# Patient Record
Sex: Male | Born: 1945 | Race: White | Hispanic: No | Smoking: Current every day smoker
Health system: Southern US, Community
[De-identification: ages and names within clinical notes are randomized; demographics above are authoritative.]

## PROBLEM LIST (undated history)

## (undated) DIAGNOSIS — I10 Essential (primary) hypertension: Secondary | ICD-10-CM

## (undated) DIAGNOSIS — K7290 Hepatic failure, unspecified without coma: Secondary | ICD-10-CM

## (undated) DIAGNOSIS — K760 Fatty (change of) liver, not elsewhere classified: Secondary | ICD-10-CM

## (undated) DIAGNOSIS — I8500 Esophageal varices without bleeding: Secondary | ICD-10-CM

## (undated) DIAGNOSIS — N390 Urinary tract infection, site not specified: Secondary | ICD-10-CM

## (undated) DIAGNOSIS — F329 Major depressive disorder, single episode, unspecified: Secondary | ICD-10-CM

## (undated) DIAGNOSIS — K7460 Unspecified cirrhosis of liver: Secondary | ICD-10-CM

## (undated) DIAGNOSIS — S12100A Unspecified displaced fracture of second cervical vertebra, initial encounter for closed fracture: Secondary | ICD-10-CM

## (undated) DIAGNOSIS — K264 Chronic or unspecified duodenal ulcer with hemorrhage: Secondary | ICD-10-CM

## (undated) DIAGNOSIS — K7682 Hepatic encephalopathy: Secondary | ICD-10-CM

## (undated) DIAGNOSIS — F1911 Other psychoactive substance abuse, in remission: Secondary | ICD-10-CM

## (undated) DIAGNOSIS — F1027 Alcohol dependence with alcohol-induced persisting dementia: Secondary | ICD-10-CM

## (undated) DIAGNOSIS — S32010A Wedge compression fracture of first lumbar vertebra, initial encounter for closed fracture: Secondary | ICD-10-CM

## (undated) DIAGNOSIS — J449 Chronic obstructive pulmonary disease, unspecified: Secondary | ICD-10-CM

## (undated) DIAGNOSIS — G9340 Encephalopathy, unspecified: Secondary | ICD-10-CM

## (undated) DIAGNOSIS — R188 Other ascites: Secondary | ICD-10-CM

## (undated) DIAGNOSIS — F32A Depression, unspecified: Secondary | ICD-10-CM

## (undated) DIAGNOSIS — R768 Other specified abnormal immunological findings in serum: Secondary | ICD-10-CM

## (undated) HISTORY — PX: CORONARY ANGIOPLASTY WITH STENT PLACEMENT: SHX49

---

## 1999-06-21 ENCOUNTER — Ambulatory Visit (HOSPITAL_COMMUNITY): Admission: RE | Admit: 1999-06-21 | Discharge: 1999-06-22 | Payer: Self-pay | Admitting: Cardiovascular Disease

## 2001-07-29 ENCOUNTER — Encounter: Payer: Self-pay | Admitting: Family Medicine

## 2001-07-29 ENCOUNTER — Ambulatory Visit (HOSPITAL_COMMUNITY): Admission: RE | Admit: 2001-07-29 | Discharge: 2001-07-29 | Payer: Self-pay | Admitting: Family Medicine

## 2002-06-15 ENCOUNTER — Emergency Department (HOSPITAL_COMMUNITY): Admission: EM | Admit: 2002-06-15 | Discharge: 2002-06-15 | Payer: Self-pay | Admitting: Emergency Medicine

## 2002-06-15 ENCOUNTER — Encounter: Payer: Self-pay | Admitting: Emergency Medicine

## 2002-10-03 ENCOUNTER — Ambulatory Visit (HOSPITAL_COMMUNITY): Admission: RE | Admit: 2002-10-03 | Discharge: 2002-10-03 | Payer: Self-pay | Admitting: Family Medicine

## 2002-10-03 ENCOUNTER — Encounter: Payer: Self-pay | Admitting: Family Medicine

## 2004-02-23 ENCOUNTER — Other Ambulatory Visit: Admission: RE | Admit: 2004-02-23 | Discharge: 2004-02-23 | Payer: Self-pay | Admitting: Dermatology

## 2005-01-16 ENCOUNTER — Ambulatory Visit (HOSPITAL_COMMUNITY): Admission: RE | Admit: 2005-01-16 | Discharge: 2005-01-16 | Payer: Self-pay | Admitting: Family Medicine

## 2005-11-20 ENCOUNTER — Ambulatory Visit (HOSPITAL_COMMUNITY): Admission: RE | Admit: 2005-11-20 | Discharge: 2005-11-20 | Payer: Self-pay | Admitting: Family Medicine

## 2009-03-30 ENCOUNTER — Emergency Department (HOSPITAL_COMMUNITY): Admission: EM | Admit: 2009-03-30 | Discharge: 2009-03-30 | Payer: Self-pay | Admitting: Emergency Medicine

## 2010-11-05 LAB — POCT CARDIAC MARKERS
CKMB, poc: 1.5 ng/mL (ref 1.0–8.0)
Myoglobin, poc: 105 ng/mL (ref 12–200)
Myoglobin, poc: 119 ng/mL (ref 12–200)

## 2010-11-05 LAB — COMPREHENSIVE METABOLIC PANEL
ALT: 27 U/L (ref 0–53)
Alkaline Phosphatase: 50 U/L (ref 39–117)
CO2: 28 mEq/L (ref 19–32)
Chloride: 102 mEq/L (ref 96–112)
Glucose, Bld: 110 mg/dL — ABNORMAL HIGH (ref 70–99)
Potassium: 3.6 mEq/L (ref 3.5–5.1)
Sodium: 137 mEq/L (ref 135–145)
Total Bilirubin: 0.8 mg/dL (ref 0.3–1.2)
Total Protein: 7.2 g/dL (ref 6.0–8.3)

## 2010-11-05 LAB — CBC
HCT: 40.8 % (ref 39.0–52.0)
Hemoglobin: 14 g/dL (ref 13.0–17.0)
MCHC: 34.3 g/dL (ref 30.0–36.0)
MCV: 100.4 fL — ABNORMAL HIGH (ref 78.0–100.0)
Platelets: 176 10*3/uL (ref 150–400)
RBC: 4.06 MIL/uL — ABNORMAL LOW (ref 4.22–5.81)
RDW: 13 % (ref 11.5–15.5)
WBC: 9.7 10*3/uL (ref 4.0–10.5)

## 2010-11-05 LAB — DIFFERENTIAL
Basophils Relative: 0 % (ref 0–1)
Eosinophils Absolute: 0.4 10*3/uL (ref 0.0–0.7)
Monocytes Relative: 7 % (ref 3–12)
Neutrophils Relative %: 65 % (ref 43–77)

## 2011-02-07 ENCOUNTER — Ambulatory Visit: Payer: Self-pay | Admitting: Gastroenterology

## 2011-02-09 ENCOUNTER — Ambulatory Visit: Payer: Self-pay | Admitting: Gastroenterology

## 2011-03-23 ENCOUNTER — Ambulatory Visit: Payer: Self-pay | Admitting: Orthopedic Surgery

## 2011-08-12 ENCOUNTER — Emergency Department (HOSPITAL_COMMUNITY): Payer: No Typology Code available for payment source

## 2011-08-12 ENCOUNTER — Encounter (HOSPITAL_COMMUNITY): Payer: Self-pay | Admitting: *Deleted

## 2011-08-12 ENCOUNTER — Emergency Department (HOSPITAL_COMMUNITY)
Admission: EM | Admit: 2011-08-12 | Discharge: 2011-08-12 | Disposition: A | Discharge status: Home / Self Care | Payer: No Typology Code available for payment source | Attending: Emergency Medicine | Admitting: Emergency Medicine

## 2011-08-12 DIAGNOSIS — S0003XA Contusion of scalp, initial encounter: Secondary | ICD-10-CM | POA: Insufficient documentation

## 2011-08-12 DIAGNOSIS — Y92410 Unspecified street and highway as the place of occurrence of the external cause: Secondary | ICD-10-CM | POA: Insufficient documentation

## 2011-08-12 DIAGNOSIS — J449 Chronic obstructive pulmonary disease, unspecified: Secondary | ICD-10-CM | POA: Insufficient documentation

## 2011-08-12 DIAGNOSIS — S0083XA Contusion of other part of head, initial encounter: Secondary | ICD-10-CM | POA: Insufficient documentation

## 2011-08-12 DIAGNOSIS — M62838 Other muscle spasm: Secondary | ICD-10-CM | POA: Insufficient documentation

## 2011-08-12 DIAGNOSIS — S0990XA Unspecified injury of head, initial encounter: Secondary | ICD-10-CM | POA: Insufficient documentation

## 2011-08-12 DIAGNOSIS — S12100A Unspecified displaced fracture of second cervical vertebra, initial encounter for closed fracture: Secondary | ICD-10-CM | POA: Insufficient documentation

## 2011-08-12 DIAGNOSIS — F17200 Nicotine dependence, unspecified, uncomplicated: Secondary | ICD-10-CM | POA: Insufficient documentation

## 2011-08-12 DIAGNOSIS — I10 Essential (primary) hypertension: Secondary | ICD-10-CM | POA: Insufficient documentation

## 2011-08-12 DIAGNOSIS — J4489 Other specified chronic obstructive pulmonary disease: Secondary | ICD-10-CM | POA: Insufficient documentation

## 2011-08-12 HISTORY — DX: Chronic obstructive pulmonary disease, unspecified: J44.9

## 2011-08-12 HISTORY — DX: Essential (primary) hypertension: I10

## 2011-08-12 MED ORDER — NAPROXEN 500 MG PO TABS: 500.0000 mg | ORAL_TABLET | Freq: Two times a day (BID) | ORAL | Status: DC

## 2011-08-12 MED ORDER — CYCLOBENZAPRINE HCL 10 MG PO TABS: 10.0000 mg | ORAL_TABLET | Freq: Two times a day (BID) | ORAL | Status: DC | PRN

## 2011-08-12 NOTE — ED Notes (Signed)
Received report on pt, pt sitting in wheelchair, c-collar remains in place, cms intact all extremities, family at bedside,
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
## 2011-08-12 NOTE — ED Notes (Signed)
Pt had 20 gauge iv in place in left ac that was removed prior to discharge
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
## 2011-08-13 ENCOUNTER — Encounter (HOSPITAL_COMMUNITY): Payer: Self-pay | Admitting: Emergency Medicine

## 2011-08-13 ENCOUNTER — Emergency Department (HOSPITAL_COMMUNITY): Payer: No Typology Code available for payment source

## 2011-08-13 ENCOUNTER — Emergency Department (HOSPITAL_COMMUNITY)
Admission: EM | Admit: 2011-08-13 | Discharge: 2011-08-13 | Disposition: A | Discharge status: Home / Self Care | Payer: No Typology Code available for payment source | Attending: Emergency Medicine | Admitting: Emergency Medicine

## 2011-08-13 DIAGNOSIS — J4489 Other specified chronic obstructive pulmonary disease: Secondary | ICD-10-CM | POA: Insufficient documentation

## 2011-08-13 DIAGNOSIS — I10 Essential (primary) hypertension: Secondary | ICD-10-CM | POA: Insufficient documentation

## 2011-08-13 DIAGNOSIS — Z951 Presence of aortocoronary bypass graft: Secondary | ICD-10-CM | POA: Insufficient documentation

## 2011-08-13 DIAGNOSIS — M25569 Pain in unspecified knee: Secondary | ICD-10-CM | POA: Insufficient documentation

## 2011-08-13 DIAGNOSIS — J449 Chronic obstructive pulmonary disease, unspecified: Secondary | ICD-10-CM | POA: Insufficient documentation

## 2011-08-13 DIAGNOSIS — F17200 Nicotine dependence, unspecified, uncomplicated: Secondary | ICD-10-CM | POA: Insufficient documentation

## 2011-08-13 DIAGNOSIS — M25461 Effusion, right knee: Secondary | ICD-10-CM

## 2011-08-13 DIAGNOSIS — M25469 Effusion, unspecified knee: Secondary | ICD-10-CM | POA: Insufficient documentation
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
## 2011-08-13 NOTE — ED Notes (Signed)
Patient unstable with knee immobilizer and crutches, per family patient has walker at home. Patient instructed to use walker to ambulate. EDPA aware. Family also instructed to bring patient back if unable to ambulate steadily with walker, Terry-family member member verbalized understanding.

## 2011-08-14 NOTE — ED Provider Notes (Signed)
Medical screening examination/treatment/procedure(s) were performed by non-physician practitioner and as supervising physician I was immediately available for consultation/collaboration.   Joya Gaskins, MD 08/14/11 2047

## 2011-08-15 NOTE — ED Notes (Signed)
Pt returned today 08/15/11 for aspen collar. Collar applied. Pt has knee immobilizer on backward. Immobilizer reapplied correctly

## 2011-08-17 ENCOUNTER — Observation Stay (HOSPITAL_COMMUNITY)
Admission: EM | Admit: 2011-08-17 | Discharge: 2011-08-18 | DRG: 194 | Disposition: A | Discharge status: Home / Self Care | Payer: No Typology Code available for payment source | Attending: Internal Medicine | Admitting: Internal Medicine

## 2011-08-17 ENCOUNTER — Encounter (HOSPITAL_COMMUNITY): Payer: Self-pay | Admitting: Emergency Medicine

## 2011-08-17 ENCOUNTER — Emergency Department (HOSPITAL_COMMUNITY): Payer: No Typology Code available for payment source

## 2011-08-17 DIAGNOSIS — J449 Chronic obstructive pulmonary disease, unspecified: Secondary | ICD-10-CM | POA: Diagnosis present

## 2011-08-17 DIAGNOSIS — F17200 Nicotine dependence, unspecified, uncomplicated: Secondary | ICD-10-CM | POA: Diagnosis present

## 2011-08-17 DIAGNOSIS — I10 Essential (primary) hypertension: Secondary | ICD-10-CM | POA: Diagnosis present

## 2011-08-17 DIAGNOSIS — J189 Pneumonia, unspecified organism: Principal | ICD-10-CM | POA: Diagnosis present

## 2011-08-17 DIAGNOSIS — Z720 Tobacco use: Secondary | ICD-10-CM

## 2011-08-17 DIAGNOSIS — J4489 Other specified chronic obstructive pulmonary disease: Secondary | ICD-10-CM | POA: Diagnosis present

## 2011-08-17 DIAGNOSIS — E860 Dehydration: Secondary | ICD-10-CM | POA: Diagnosis present

## 2011-08-17 DIAGNOSIS — D7589 Other specified diseases of blood and blood-forming organs: Secondary | ICD-10-CM | POA: Diagnosis present

## 2011-08-17 DIAGNOSIS — S12100A Unspecified displaced fracture of second cervical vertebra, initial encounter for closed fracture: Secondary | ICD-10-CM | POA: Diagnosis present

## 2011-08-17 LAB — COMPREHENSIVE METABOLIC PANEL
ALT: 24 U/L (ref 0–53)
Alkaline Phosphatase: 74 U/L (ref 39–117)
CO2: 28 mEq/L (ref 19–32)
Chloride: 99 mEq/L (ref 96–112)
GFR calc Af Amer: 62 mL/min — ABNORMAL LOW (ref >90)
GFR calc non Af Amer: 54 mL/min — ABNORMAL LOW (ref >90)
Glucose, Bld: 98 mg/dL (ref 70–99)
Potassium: 3.4 mEq/L — ABNORMAL LOW (ref 3.5–5.1)
Sodium: 136 mEq/L (ref 135–145)

## 2011-08-17 LAB — URINALYSIS, ROUTINE W REFLEX MICROSCOPIC
Bilirubin Urine: NEGATIVE
Ketones, ur: NEGATIVE mg/dL
Nitrite: NEGATIVE
Urobilinogen, UA: 0.2 mg/dL (ref 0.0–1.0)

## 2011-08-17 LAB — CBC
MCV: 106.3 fL — ABNORMAL HIGH (ref 78.0–100.0)
Platelets: 139 10*3/uL — ABNORMAL LOW (ref 150–400)
RBC: 3.65 MIL/uL — ABNORMAL LOW (ref 4.22–5.81)
WBC: 8.7 10*3/uL (ref 4.0–10.5)

## 2011-08-17 LAB — DIFFERENTIAL
Lymphocytes Relative: 14 % (ref 12–46)
Lymphs Abs: 1.2 10*3/uL (ref 0.7–4.0)
Neutrophils Relative %: 77 % (ref 43–77)

## 2011-08-17 MED ORDER — ACETAMINOPHEN 650 MG RE SUPP: 650.0000 mg | Freq: Four times a day (QID) | RECTAL | Status: DC | PRN

## 2011-08-17 MED ORDER — FLUTICASONE-SALMETEROL 250-50 MCG/DOSE IN AEPB
1.0000 | INHALATION_SPRAY | Freq: Two times a day (BID) | RESPIRATORY_TRACT | Status: DC
  Administered 2011-08-17 – 2011-08-18 (×2): 1 via RESPIRATORY_TRACT
  Filled 2011-08-17: qty 14

## 2011-08-17 MED ORDER — DEXTROSE 5 % IV SOLN
1.0000 g | Freq: Once | INTRAVENOUS | Status: AC
  Administered 2011-08-17: 1 g via INTRAVENOUS
  Filled 2011-08-17: qty 10

## 2011-08-17 MED ORDER — DEXTROSE 5 % IV SOLN: 500.0000 mg | INTRAVENOUS | Status: DC

## 2011-08-17 MED ORDER — SODIUM CHLORIDE 0.9 % IV BOLUS (SEPSIS)
500.0000 mL | Freq: Once | INTRAVENOUS | Status: AC
  Administered 2011-08-17: 500 mL via INTRAVENOUS

## 2011-08-17 MED ORDER — ACETAMINOPHEN 325 MG PO TABS
650.0000 mg | ORAL_TABLET | Freq: Once | ORAL | Status: AC
  Administered 2011-08-17: 650 mg via ORAL
  Filled 2011-08-17: qty 2

## 2011-08-17 MED ORDER — ONDANSETRON HCL 4 MG PO TABS: 4.0000 mg | ORAL_TABLET | Freq: Four times a day (QID) | ORAL | Status: DC | PRN

## 2011-08-17 MED ORDER — OXYCODONE-ACETAMINOPHEN 10-325 MG PO TABS: 1.0000 | ORAL_TABLET | ORAL | Status: DC | PRN

## 2011-08-17 MED ORDER — OLMESARTAN MEDOXOMIL 20 MG PO TABS
40.0000 mg | ORAL_TABLET | Freq: Every day | ORAL | Status: DC
  Administered 2011-08-18: 40 mg via ORAL
  Filled 2011-08-17: qty 2

## 2011-08-17 MED ORDER — OXYCODONE-ACETAMINOPHEN 5-325 MG PO TABS: 2.0000 | ORAL_TABLET | ORAL | Status: DC | PRN

## 2011-08-17 MED ORDER — CEFTRIAXONE SODIUM 1 G IJ SOLR
1.0000 g | INTRAMUSCULAR | Status: DC
Start: 2011-08-18 — End: 2011-08-18
  Filled 2011-08-17: qty 10

## 2011-08-17 MED ORDER — CYCLOBENZAPRINE HCL 10 MG PO TABS
10.0000 mg | ORAL_TABLET | Freq: Two times a day (BID) | ORAL | Status: DC | PRN
  Administered 2011-08-17: 10 mg via ORAL
  Filled 2011-08-17 (×2): qty 1

## 2011-08-17 MED ORDER — DEXTROSE 5 % IV SOLN
500.0000 mg | Freq: Once | INTRAVENOUS | Status: AC
  Administered 2011-08-17: 500 mg via INTRAVENOUS
  Filled 2011-08-17: qty 500

## 2011-08-17 MED ORDER — ALBUTEROL SULFATE (5 MG/ML) 0.5% IN NEBU
2.5000 mg | INHALATION_SOLUTION | RESPIRATORY_TRACT | Status: DC | PRN
  Administered 2011-08-18: 2.5 mg via RESPIRATORY_TRACT
  Filled 2011-08-17: qty 0.5

## 2011-08-17 MED ORDER — ONDANSETRON HCL 4 MG/2ML IJ SOLN: 4.0000 mg | Freq: Four times a day (QID) | INTRAMUSCULAR | Status: DC | PRN

## 2011-08-17 MED ORDER — ACETAMINOPHEN 325 MG PO TABS: 650.0000 mg | ORAL_TABLET | Freq: Four times a day (QID) | ORAL | Status: DC | PRN

## 2011-08-17 MED ORDER — SODIUM CHLORIDE 0.9 % IV SOLN
INTRAVENOUS | Status: DC
  Administered 2011-08-17: 15:00:00 via INTRAVENOUS

## 2011-08-17 MED ORDER — FLUTICASONE-SALMETEROL 250-50 MCG/DOSE IN AEPB
INHALATION_SPRAY | RESPIRATORY_TRACT | Status: AC
  Filled 2011-08-17: qty 14
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
## 2011-08-17 NOTE — ED Notes (Signed)
Attempted to call report. RN phone line busy. Will call back.
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
## 2011-08-17 NOTE — ED Notes (Signed)
Patient reports he is unable to urinate at this time.

## 2011-08-17 NOTE — ED Notes (Signed)
Patient's family reporting cough since yesterday. Reports coughing up "brownish" sputum.

## 2011-08-17 NOTE — ED Notes (Signed)
Attempted to call report. RN to call back. 
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
## 2011-08-17 NOTE — ED Notes (Signed)
Report given to Nikki, RN unit 300. Ready to receive patient. 

## 2011-08-18 LAB — CBC
HCT: 37.8 % — ABNORMAL LOW (ref 39.0–52.0)
Hemoglobin: 12.5 g/dL — ABNORMAL LOW (ref 13.0–17.0)
MCH: 35.3 pg — ABNORMAL HIGH (ref 26.0–34.0)
RBC: 3.54 MIL/uL — ABNORMAL LOW (ref 4.22–5.81)

## 2011-08-18 LAB — COMPREHENSIVE METABOLIC PANEL
AST: 52 U/L — ABNORMAL HIGH (ref 0–37)
Albumin: 2.7 g/dL — ABNORMAL LOW (ref 3.5–5.2)
CO2: 29 mEq/L (ref 19–32)
Calcium: 8.6 mg/dL (ref 8.4–10.5)
Creatinine, Ser: 1.15 mg/dL (ref 0.50–1.35)
GFR calc non Af Amer: 66 mL/min — ABNORMAL LOW (ref >90)
Sodium: 136 mEq/L (ref 135–145)
Total Protein: 6.9 g/dL (ref 6.0–8.3)

## 2011-08-18 LAB — TSH: TSH: 1.324 u[IU]/mL (ref 0.350–4.500)

## 2011-08-18 LAB — GAMMA GT: GGT: 111 U/L — ABNORMAL HIGH (ref 7–51)

## 2011-08-18 MED ORDER — CYCLOBENZAPRINE HCL 10 MG PO TABS: 10.0000 mg | ORAL_TABLET | Freq: Two times a day (BID) | ORAL | Status: AC | PRN

## 2011-08-18 MED ORDER — POTASSIUM CHLORIDE CRYS ER 20 MEQ PO TBCR
40.0000 meq | EXTENDED_RELEASE_TABLET | Freq: Once | ORAL | Status: AC
  Administered 2011-08-18: 40 meq via ORAL
  Filled 2011-08-18: qty 2

## 2011-08-18 MED ORDER — MOXIFLOXACIN HCL 400 MG PO TABS: 400.0000 mg | ORAL_TABLET | Freq: Every day | ORAL | Status: AC

## 2011-08-18 MED ORDER — CYCLOBENZAPRINE HCL 10 MG PO TABS: 10.0000 mg | ORAL_TABLET | Freq: Three times a day (TID) | ORAL | Status: DC | PRN
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
## 2011-08-18 NOTE — Progress Notes (Signed)
08/18/11 1000 patient discharged home this morning. IV site d/c'd per nurse tech, site within normal limits. Denied pain or discomfort prior to discharge. Reviewed discharge instructions with patient, given copy of instructions, med list, carenote, prescriptions, and f/u appointment information. Pt to follow up with Dr Jeral Fruit in 2 weeks for evaluation of neck, instructed to wear c-collar as ordered, stated he understood, collar in place at discharge. Attempted to schedule follow up appointments, Dr Jeral Fruit office not taking calls until 1000, patient had to leave at 0930 or would not have ride when brother back at work. Dr Regino Schultze office closed today. Instructed to call and set up appointments as soon as possible, patient and his brother verbalized understanding. Notified Dr Karilyn Cota of situation, stated okay for discharge and patient/family to set up appointments, pt left floor in stable condition via w/c accompanied by nurse tech.

## 2011-08-22 ENCOUNTER — Other Ambulatory Visit (HOSPITAL_COMMUNITY): Payer: Self-pay | Admitting: Orthopedic Surgery

## 2011-08-22 DIAGNOSIS — R52 Pain, unspecified: Secondary | ICD-10-CM

## 2011-08-23 ENCOUNTER — Ambulatory Visit (HOSPITAL_COMMUNITY)
Admission: RE | Admit: 2011-08-23 | Discharge: 2011-08-23 | Disposition: A | Discharge status: Home / Self Care | Payer: No Typology Code available for payment source | Source: Ambulatory Visit | Attending: Orthopedic Surgery | Admitting: Orthopedic Surgery

## 2011-08-23 DIAGNOSIS — R52 Pain, unspecified: Secondary | ICD-10-CM

## 2011-08-23 DIAGNOSIS — M79609 Pain in unspecified limb: Secondary | ICD-10-CM | POA: Insufficient documentation

## 2011-08-23 DIAGNOSIS — M7989 Other specified soft tissue disorders: Secondary | ICD-10-CM | POA: Insufficient documentation

## 2011-08-24 ENCOUNTER — Telehealth: Payer: Self-pay | Admitting: Orthopedic Surgery

## 2011-08-24 ENCOUNTER — Encounter (HOSPITAL_COMMUNITY): Payer: Self-pay | Admitting: *Deleted

## 2011-08-24 ENCOUNTER — Emergency Department (HOSPITAL_COMMUNITY)
Admission: EM | Admit: 2011-08-24 | Discharge: 2011-08-24 | Disposition: A | Discharge status: Home / Self Care | Payer: No Typology Code available for payment source | Attending: Emergency Medicine | Admitting: Emergency Medicine

## 2011-08-24 DIAGNOSIS — Z79899 Other long term (current) drug therapy: Secondary | ICD-10-CM | POA: Insufficient documentation

## 2011-08-24 DIAGNOSIS — I10 Essential (primary) hypertension: Secondary | ICD-10-CM | POA: Insufficient documentation

## 2011-08-24 DIAGNOSIS — R410 Disorientation, unspecified: Secondary | ICD-10-CM

## 2011-08-24 DIAGNOSIS — F29 Unspecified psychosis not due to a substance or known physiological condition: Secondary | ICD-10-CM | POA: Insufficient documentation

## 2011-08-24 DIAGNOSIS — M7989 Other specified soft tissue disorders: Secondary | ICD-10-CM | POA: Insufficient documentation

## 2011-08-24 DIAGNOSIS — F17200 Nicotine dependence, unspecified, uncomplicated: Secondary | ICD-10-CM | POA: Insufficient documentation

## 2011-08-24 DIAGNOSIS — J4489 Other specified chronic obstructive pulmonary disease: Secondary | ICD-10-CM | POA: Insufficient documentation

## 2011-08-24 DIAGNOSIS — S129XXA Fracture of neck, unspecified, initial encounter: Secondary | ICD-10-CM | POA: Insufficient documentation

## 2011-08-24 DIAGNOSIS — J449 Chronic obstructive pulmonary disease, unspecified: Secondary | ICD-10-CM | POA: Insufficient documentation

## 2011-08-24 LAB — GLUCOSE, CAPILLARY: Glucose-Capillary: 96 mg/dL (ref 70–99)
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
## 2011-08-24 NOTE — ED Notes (Signed)
BGL checked at nurse's request - BGL 96
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
## 2011-10-05 ENCOUNTER — Other Ambulatory Visit: Payer: Self-pay | Admitting: *Deleted

## 2011-10-22 ENCOUNTER — Emergency Department (HOSPITAL_COMMUNITY)
Admission: EM | Admit: 2011-10-22 | Discharge: 2011-10-23 | Disposition: A | Discharge status: Home / Self Care | Payer: BC Managed Care – PPO | Attending: Emergency Medicine | Admitting: Emergency Medicine

## 2011-10-22 ENCOUNTER — Encounter (HOSPITAL_COMMUNITY): Payer: Self-pay | Admitting: *Deleted

## 2011-10-22 DIAGNOSIS — I10 Essential (primary) hypertension: Secondary | ICD-10-CM | POA: Insufficient documentation

## 2011-10-22 DIAGNOSIS — F1010 Alcohol abuse, uncomplicated: Secondary | ICD-10-CM

## 2011-10-22 DIAGNOSIS — R443 Hallucinations, unspecified: Secondary | ICD-10-CM | POA: Insufficient documentation

## 2011-10-22 DIAGNOSIS — J4489 Other specified chronic obstructive pulmonary disease: Secondary | ICD-10-CM | POA: Insufficient documentation

## 2011-10-22 DIAGNOSIS — R4789 Other speech disturbances: Secondary | ICD-10-CM | POA: Insufficient documentation

## 2011-10-22 DIAGNOSIS — J449 Chronic obstructive pulmonary disease, unspecified: Secondary | ICD-10-CM | POA: Insufficient documentation

## 2011-10-22 DIAGNOSIS — R45851 Suicidal ideations: Secondary | ICD-10-CM | POA: Insufficient documentation

## 2011-10-22 DIAGNOSIS — Z79899 Other long term (current) drug therapy: Secondary | ICD-10-CM | POA: Insufficient documentation

## 2011-10-22 LAB — URINALYSIS, ROUTINE W REFLEX MICROSCOPIC
Bilirubin Urine: NEGATIVE
Glucose, UA: NEGATIVE mg/dL
Hgb urine dipstick: NEGATIVE
Ketones, ur: NEGATIVE mg/dL
Leukocytes, UA: NEGATIVE
Nitrite: NEGATIVE
Protein, ur: NEGATIVE mg/dL
Specific Gravity, Urine: <1.005 — ABNORMAL LOW (ref 1.005–1.030)
Urobilinogen, UA: 0.2 mg/dL (ref 0.0–1.0)
pH: 7 (ref 5.0–8.0)

## 2011-10-22 LAB — RAPID URINE DRUG SCREEN, HOSP PERFORMED
Amphetamines: NOT DETECTED
Barbiturates: NOT DETECTED
Benzodiazepines: POSITIVE — AB
Cocaine: NOT DETECTED
Opiates: NOT DETECTED
Tetrahydrocannabinol: NOT DETECTED

## 2011-10-22 NOTE — ED Provider Notes (Signed)
History     CSN: [id number]  Arrival date & time 10/22/11  2205   First MD Initiated Contact with Patient 10/22/11 2234      Chief Complaint  Patient presents with  . V70.1    (Consider location/radiation/quality/duration/timing/severity/associated sxs/prior treatment) HPI Comments: Pt wa brought to the ED by a police officer with involuntary commitment papers.  It states that he is suicidal and told his mother that he was going to step in front of a truck.  He reported abuses narcotics and alcohol.  He has been destroying things in his mom's house.  He reportedly  is hallucinating and hearing voices.   When i asked about each carge individually he denies  Every single one.  The history is provided by the patient. History Limited By: pt has been drinking alcohol this PM.    Past Medical History  Diagnosis Date  . COPD (chronic obstructive pulmonary disease)   . Hypertension     Past Surgical History  Procedure Date  . Coronary angioplasty with stent placement     No family history on file.  History  Substance Use Topics  . Smoking status: Current Everyday Smoker -- 1.0 packs/day  . Smokeless tobacco: Never Used  . Alcohol Use: Yes     3 times a week      Review of Systems  Psychiatric/Behavioral: Positive for suicidal ideas and behavioral problems.    Allergies  Review of patient's allergies indicates no known allergies.  Home Medications   Current Outpatient Rx  Name Route Sig Dispense Refill  . FLUTICASONE-SALMETEROL 250-50 MCG/DOSE IN AEPB Inhalation Inhale 1 puff into the lungs every 12 (twelve) hours.    Marland Kitchen OLMESARTAN MEDOXOMIL 40 MG PO TABS Oral Take 40 mg by mouth daily.    Marland Kitchen OMEPRAZOLE 20 MG PO CPDR Oral Take 20 mg by mouth daily as needed. Acid Reflux    . OXYCODONE-ACETAMINOPHEN 10-325 MG PO TABS Oral Take 1 tablet by mouth every 4 (four) hours as needed. Pain      BP 136/97  Pulse 63  Temp(Src) 97.6 F (36.4 C) (Oral)  Resp 20  Ht 6\' 1"   (1.854 m)  Wt 260 lb (117.935 kg)  BMI 34.30 kg/m2  SpO2 100%  Physical Exam  Nursing note and vitals reviewed. Constitutional: He is oriented to person, place, and time. He appears well-developed and well-nourished.  HENT:  Head: Normocephalic and atraumatic.  Right Ear: External ear normal.  Left Ear: External ear normal.  Nose: Nose normal.  Eyes: EOM are normal. Pupils are equal, round, and reactive to light.  Neck: Normal range of motion.  Cardiovascular: Normal rate, regular rhythm, normal heart sounds and intact distal pulses.   Pulmonary/Chest: Effort normal and breath sounds normal. No accessory muscle usage. Not tachypneic. No respiratory distress. He has no decreased breath sounds. He has no wheezes. He has no rhonchi. He has no rales. He exhibits no tenderness.  Abdominal: Soft. He exhibits no distension. There is no tenderness.  Musculoskeletal: Normal range of motion. He exhibits no tenderness.  Neurological: He is alert and oriented to person, place, and time. He has normal strength. No cranial nerve deficit or sensory deficit. GCS eye subscore is 4. GCS verbal subscore is 5. GCS motor subscore is 6.       Pt smells and acts intoxicated.  His answers are slow and his voice is slurred.  Skin: Skin is warm and dry.  Psychiatric: He has a normal mood and affect. Judgment  normal.    ED Course  Procedures (including critical care time)  Labs Reviewed  URINALYSIS, ROUTINE W REFLEX MICROSCOPIC - Abnormal; Notable for the following:    Specific Gravity, Urine <1.005 (*)    All other components within normal limits  URINE RAPID DRUG SCREEN (HOSP PERFORMED) - Abnormal; Notable for the following:    Benzodiazepines POSITIVE (*)    All other components within normal limits  CBC  DIFFERENTIAL  COMPREHENSIVE METABOLIC PANEL   No results found.   No diagnosis found.    MDM  i spoke first with ella from [facility].  She states there are no beds available for placement.  i do  not get the impression that the pt is homicidal or suicidal.  He denies hearing voices or hallucinating.  He admits to drinking alcohol but denies narcotic abuse.  He urine is positive for benzos and there are no benzos on his medication list.  Samson Frederic recommends a telepsych eval and following that either rescinding the involuntary commitment or is placement is deemed necessary, the pt will need to wait until  Morning and have tommy work on that.  i have also discussed the case with dr. Dierdre Highman who has assumed pt care.  Worthy Rancher, PA 10/23/11 0110  Medical screening examination/treatment/procedure(s) were performed by non-physician practitioner and as supervising physician I was immediately available for consultation/collaboration.  Sunnie Nielsen, MD 10/23/11 2000
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
## 2011-10-23 LAB — DIFFERENTIAL
Basophils Absolute: 0.1 10*3/uL (ref 0.0–0.1)
Basophils Relative: 1 % (ref 0–1)
Eosinophils Relative: 4 % (ref 0–5)
Monocytes Absolute: 0.5 10*3/uL (ref 0.1–1.0)
Monocytes Relative: 8 % (ref 3–12)

## 2011-10-23 LAB — COMPREHENSIVE METABOLIC PANEL
AST: 176 U/L — ABNORMAL HIGH (ref 0–37)
Albumin: 3.2 g/dL — ABNORMAL LOW (ref 3.5–5.2)
BUN: 4 mg/dL — ABNORMAL LOW (ref 6–23)
CO2: 30 mEq/L (ref 19–32)
Calcium: 8.8 mg/dL (ref 8.4–10.5)
Chloride: 99 mEq/L (ref 96–112)
Creatinine, Ser: 0.84 mg/dL (ref 0.50–1.35)
GFR calc non Af Amer: >90 mL/min (ref >90)
Total Bilirubin: 1 mg/dL (ref 0.3–1.2)

## 2011-10-23 LAB — CBC
HCT: 38.1 % — ABNORMAL LOW (ref 39.0–52.0)
Hemoglobin: 13.2 g/dL (ref 13.0–17.0)
MCHC: 34.6 g/dL (ref 30.0–36.0)
MCV: 101.1 fL — ABNORMAL HIGH (ref 78.0–100.0)
RDW: 18.2 % — ABNORMAL HIGH (ref 11.5–15.5)

## 2011-10-23 LAB — ETHANOL: Alcohol, Ethyl (B): 140 mg/dL — ABNORMAL HIGH (ref 0–11)

## 2011-10-23 MED ORDER — LORAZEPAM 1 MG PO TABS
ORAL_TABLET | ORAL | Status: AC
  Administered 2011-10-23: 1 mg
  Filled 2011-10-23: qty 1

## 2011-10-23 NOTE — Discharge Instructions (Signed)
RESOURCE GUIDE  Dental Problems  Patients with Medicaid: [facility]                     [facility] [address]                                           [address] Phone:  [phone]                                                  Phone:  [phone]  If unable to pay or uninsured, contact:  [facility] or [facility] to become qualified for the adult dental clinic.  Chronic Pain Problems Contact [facility]  [phone] Patients need to be referred by their primary care doctor.  Insufficient Money for Medicine Contact [employer]:  call "211" or [facility] 271-5999.  No Primary Care Doctor Call [facility]  832-8000 Other agencies that provide inexpensive medical care    [facility]  [phone]    [facility]  [phone]    [facility]  [phone]    Women[facility] Clinic  [phone]    [facility]  [phone]    [facility]  [phone]  Psychological Services [facility]  [phone] [facility]  [phone] [facility]   [phone] (emergency services [phone])  Substance Abuse Resources [facility]  336-882-2125 [facility] 336-784-9470 [facility] 336-285-9073 [facility] 336-845-3988 Residential & Outpatient Substance Abuse Program  800-659-3381  Abuse/Neglect [place] Child Abuse Hotline (336) 641-3795 [place] Child Abuse Hotline 800-378-5315 (After Hours)  Emergency Shelter [facility] (336) 271-5985  Maternity Homes [facility] (336) 275-9566 [facility] (704) 372-4663  MRSA Hotline #:   832-7006    [place] Resources  [facility] [place] [facility]     [employer]                          [facility] [address] [place]                       [address]      [address]  [place]                                                [place]                            [place] Phone:  [phone]                                   Phone:  [phone]                 Phone:  [phone]  [facility] Phone:  [phone]    [facility] Child Abuse Hotline 343-569-3838 (516) 614-5138 (After Hours)    Take your usual prescriptions as previously directed.  Call your regular medical doctor today to schedule a follow up appointment this week.  Call the referrals given to you today for the alcohol detox facilities if you change your mind about quitting alcohol.  Return to the Emergency Department immediately sooner if worsening.

## 2011-10-23 NOTE — ED Notes (Signed)
Patient up out of bed and stating he wants to call someone. Patient sitter at side and patient got back in bed to rest until 0700 to call his mother

## 2011-10-23 NOTE — ED Provider Notes (Signed)
66yo M, here under IVC from his mother for etoh abuse and alleged statements of SI and "hallucinations."  Pt has been eval by Telepsych MD with IVC rescinded and pt recommended for discharge.  Pt also eval by ACT Tommy who agrees that pt is not SI, does not want etoh detox.  Pt continues to deny SI and "hallucinations" to multiple ED staff.  Will d/c home with outpt referrals.   Laray Anger, DO 10/23/11 1043

## 2011-10-23 NOTE — BH Assessment (Signed)
Assessment Note   Roberto [name] is an 66 y.o. male. Patient brought in by [facility] on petition filed by his mother. Patient then acted out with [employer] while in custody. This morning patient states that all was a misunderstanding. He denies any SI or HI> He is not hallucinated nor delusional. He is alert. He is cooperative. Patient was seen by tele-psych.  Axis I: Alcohol Abuse Axis II: Deferred Axis III:  Past Medical History  Diagnosis Date  . COPD (chronic obstructive pulmonary disease)   . Hypertension    Axis IV: housing problems and problems with primary support group Axis V: 51-60 moderate symptoms  Past Medical History:  Past Medical History  Diagnosis Date  . COPD (chronic obstructive pulmonary disease)   . Hypertension     Past Surgical History  Procedure Date  . Coronary angioplasty with stent placement     Family History: No family history on file.  Social History:  reports that he has been smoking.  He has never used smokeless tobacco. He reports that he drinks alcohol. He reports that he does not use illicit drugs.  Additional Social History:    Allergies: No Known Allergies  Home Medications:  Medications Prior to Admission  Medication Dose Route Frequency Provider Last Rate Last Dose  . LORazepam (ATIVAN) 1 MG tablet        1 mg at 10/23/11 0536   Medications Prior to Admission  Medication Sig Dispense Refill  . Fluticasone-Salmeterol (ADVAIR) 250-50 MCG/DOSE AEPB Inhale 1 puff into the lungs every 12 (twelve) hours.      Marland Kitchen olmesartan (BENICAR) 40 MG tablet Take 40 mg by mouth daily.      Marland Kitchen omeprazole (PRILOSEC) 20 MG capsule Take 20 mg by mouth daily as needed. Acid Reflux      . oxyCODONE-acetaminophen (PERCOCET) 10-325 MG per tablet Take 1 tablet by mouth every 4 (four) hours as needed. Pain        OB/GYN Status:  No LMP for male patient.  General Assessment Data Location of Assessment: [facility] ACT Assessment: Yes Living Arrangements: Parent  (motyher) Can pt return to current living arrangement?: Yes Admission Status: Voluntary Is patient capable of signing voluntary admission?: Yes Transfer from: Acute Hospital Referral Source: MD  Education Status Is patient currently in school?: No  Risk to self Suicidal Ideation: No Suicidal Intent: No Is patient at risk for suicide?: No Suicidal Plan?: No Access to Means: No What has been your use of drugs/alcohol within the last 12 months?: etoh Previous Attempts/Gestures: No Other Self Harm Risks: denies Triggers for Past Attempts: None known Intentional Self Injurious Behavior: None Family Suicide History: No Recent stressful life event(s): Conflict (Comment) (ongoing conflict with mother over his alcohol use) Persecutory voices/beliefs?: No Depression: No Substance abuse history and/or treatment for substance abuse?: Yes Suicide prevention information given to non-admitted patients: Yes  Risk to Others Homicidal Ideation: No Thoughts of Harm to Others: No Current Homicidal Intent: No Current Homicidal Plan: No Access to Homicidal Means: No History of harm to others?: No Assessment of Violence: None Noted Does patient have access to weapons?: No Criminal Charges Pending?: No Does patient have a court date: No  Psychosis Hallucinations: None noted Delusions: None noted  Mental Status Report Appear/Hygiene: Disheveled Eye Contact: Good Motor Activity: Restlessness;Freedom of movement Speech: Rapid;Soft Level of Consciousness: Alert Mood: Ambivalent Affect: Blunted Anxiety Level: Minimal Thought Processes: Coherent Judgement: Unimpaired Orientation: Person;Place;Time Obsessive Compulsive Thoughts/Behaviors: None  Cognitive Functioning Concentration: Decreased Memory:  Recent Intact;Remote Intact IQ: Average Insight: Poor Impulse Control: Poor Appetite: Good Sleep: No Change Vegetative Symptoms: Decreased grooming  Prior Inpatient Therapy Prior  Inpatient Therapy: No  Prior Outpatient Therapy Prior Outpatient Therapy: No            Values / Beliefs Cultural Requests During Hospitalization: None Spiritual Requests During Hospitalization: None        Additional Information 1:1 In Past 12 Months?: No CIRT Risk: No Elopement Risk: No Does patient have medical clearance?: Yes     Disposition: Patient's petition was rescinded by Dr. Debbora Presto. Dr Clarene Duke was in agreement with this decision. Patient was offered referral to outpatient Substance Abuse treatment but he refused. Patient discharged from the ED. Disposition Disposition of Patient: Treatment offered and refused (offerred referral to treat ment for Alcohol abuse) Type of treatment offered and refused: Out-patient (stated alcohol not a problem)  On Site Evaluation by:   Reviewed with Physician:     Jearld Pies 10/23/2011 10:13 AM

## 2011-10-23 NOTE — ED Notes (Signed)
Patient medicated for agitation. Patient states" he needs something for his nerves and that he is going crazy."

## 2011-10-23 NOTE — ED Notes (Signed)
Pt to be discharge home. Clothing given to pt and police called to transport pt home

## 2011-12-13 ENCOUNTER — Other Ambulatory Visit (HOSPITAL_COMMUNITY): Payer: No Typology Code available for payment source

## 2011-12-13 ENCOUNTER — Inpatient Hospital Stay (HOSPITAL_COMMUNITY): Payer: Medicaid Other

## 2011-12-13 ENCOUNTER — Inpatient Hospital Stay (HOSPITAL_COMMUNITY)
Admission: EM | Admit: 2011-12-13 | Discharge: 2011-12-21 | DRG: 377 | Disposition: A | Discharge status: Skilled Nursing Facility | Payer: Medicaid Other | Attending: Internal Medicine | Admitting: Internal Medicine

## 2011-12-13 ENCOUNTER — Encounter (HOSPITAL_COMMUNITY): Payer: Self-pay

## 2011-12-13 ENCOUNTER — Emergency Department (HOSPITAL_COMMUNITY): Payer: Medicaid Other

## 2011-12-13 DIAGNOSIS — D649 Anemia, unspecified: Secondary | ICD-10-CM

## 2011-12-13 DIAGNOSIS — E870 Hyperosmolality and hypernatremia: Secondary | ICD-10-CM | POA: Diagnosis present

## 2011-12-13 DIAGNOSIS — E8779 Other fluid overload: Secondary | ICD-10-CM | POA: Diagnosis not present

## 2011-12-13 DIAGNOSIS — F1020 Alcohol dependence, uncomplicated: Secondary | ICD-10-CM | POA: Diagnosis present

## 2011-12-13 DIAGNOSIS — D72829 Elevated white blood cell count, unspecified: Secondary | ICD-10-CM | POA: Diagnosis present

## 2011-12-13 DIAGNOSIS — N179 Acute kidney failure, unspecified: Secondary | ICD-10-CM | POA: Diagnosis present

## 2011-12-13 DIAGNOSIS — D539 Nutritional anemia, unspecified: Secondary | ICD-10-CM | POA: Diagnosis present

## 2011-12-13 DIAGNOSIS — E782 Mixed hyperlipidemia: Secondary | ICD-10-CM

## 2011-12-13 DIAGNOSIS — F10939 Alcohol use, unspecified with withdrawal, unspecified: Secondary | ICD-10-CM | POA: Diagnosis present

## 2011-12-13 DIAGNOSIS — N390 Urinary tract infection, site not specified: Secondary | ICD-10-CM | POA: Diagnosis present

## 2011-12-13 DIAGNOSIS — R739 Hyperglycemia, unspecified: Secondary | ICD-10-CM | POA: Diagnosis present

## 2011-12-13 DIAGNOSIS — I10 Essential (primary) hypertension: Secondary | ICD-10-CM | POA: Diagnosis present

## 2011-12-13 DIAGNOSIS — F1010 Alcohol abuse, uncomplicated: Secondary | ICD-10-CM | POA: Diagnosis present

## 2011-12-13 DIAGNOSIS — B965 Pseudomonas (aeruginosa) (mallei) (pseudomallei) as the cause of diseases classified elsewhere: Secondary | ICD-10-CM | POA: Diagnosis present

## 2011-12-13 DIAGNOSIS — S20229A Contusion of unspecified back wall of thorax, initial encounter: Secondary | ICD-10-CM | POA: Diagnosis present

## 2011-12-13 DIAGNOSIS — R296 Repeated falls: Secondary | ICD-10-CM

## 2011-12-13 DIAGNOSIS — R109 Unspecified abdominal pain: Secondary | ICD-10-CM | POA: Diagnosis not present

## 2011-12-13 DIAGNOSIS — R895 Abnormal microbiological findings in specimens from other organs, systems and tissues: Secondary | ICD-10-CM | POA: Diagnosis present

## 2011-12-13 DIAGNOSIS — R1084 Generalized abdominal pain: Secondary | ICD-10-CM | POA: Diagnosis present

## 2011-12-13 DIAGNOSIS — R7309 Other abnormal glucose: Secondary | ICD-10-CM | POA: Diagnosis present

## 2011-12-13 DIAGNOSIS — K7290 Hepatic failure, unspecified without coma: Secondary | ICD-10-CM

## 2011-12-13 DIAGNOSIS — R17 Unspecified jaundice: Secondary | ICD-10-CM | POA: Diagnosis present

## 2011-12-13 DIAGNOSIS — F10239 Alcohol dependence with withdrawal, unspecified: Secondary | ICD-10-CM | POA: Diagnosis present

## 2011-12-13 DIAGNOSIS — D684 Acquired coagulation factor deficiency: Secondary | ICD-10-CM | POA: Diagnosis present

## 2011-12-13 DIAGNOSIS — K760 Fatty (change of) liver, not elsewhere classified: Secondary | ICD-10-CM | POA: Diagnosis present

## 2011-12-13 DIAGNOSIS — K709 Alcoholic liver disease, unspecified: Secondary | ICD-10-CM

## 2011-12-13 DIAGNOSIS — K264 Chronic or unspecified duodenal ulcer with hemorrhage: Principal | ICD-10-CM | POA: Diagnosis present

## 2011-12-13 DIAGNOSIS — J449 Chronic obstructive pulmonary disease, unspecified: Secondary | ICD-10-CM | POA: Diagnosis present

## 2011-12-13 DIAGNOSIS — F1911 Other psychoactive substance abuse, in remission: Secondary | ICD-10-CM | POA: Diagnosis present

## 2011-12-13 DIAGNOSIS — D689 Coagulation defect, unspecified: Secondary | ICD-10-CM | POA: Diagnosis present

## 2011-12-13 DIAGNOSIS — D62 Acute posthemorrhagic anemia: Secondary | ICD-10-CM | POA: Diagnosis present

## 2011-12-13 DIAGNOSIS — Z87311 Personal history of (healed) other pathological fracture: Secondary | ICD-10-CM

## 2011-12-13 DIAGNOSIS — S32010A Wedge compression fracture of first lumbar vertebra, initial encounter for closed fracture: Secondary | ICD-10-CM | POA: Diagnosis present

## 2011-12-13 DIAGNOSIS — R748 Abnormal levels of other serum enzymes: Secondary | ICD-10-CM | POA: Diagnosis present

## 2011-12-13 DIAGNOSIS — Z9181 History of falling: Secondary | ICD-10-CM

## 2011-12-13 DIAGNOSIS — D696 Thrombocytopenia, unspecified: Secondary | ICD-10-CM | POA: Diagnosis present

## 2011-12-13 DIAGNOSIS — K7030 Alcoholic cirrhosis of liver without ascites: Secondary | ICD-10-CM | POA: Diagnosis present

## 2011-12-13 DIAGNOSIS — R5381 Other malaise: Secondary | ICD-10-CM | POA: Diagnosis present

## 2011-12-13 DIAGNOSIS — E861 Hypovolemia: Secondary | ICD-10-CM | POA: Diagnosis not present

## 2011-12-13 DIAGNOSIS — F1910 Other psychoactive substance abuse, uncomplicated: Secondary | ICD-10-CM | POA: Diagnosis present

## 2011-12-13 DIAGNOSIS — K7689 Other specified diseases of liver: Secondary | ICD-10-CM | POA: Diagnosis present

## 2011-12-13 DIAGNOSIS — I8500 Esophageal varices without bleeding: Secondary | ICD-10-CM | POA: Diagnosis present

## 2011-12-13 DIAGNOSIS — F1027 Alcohol dependence with alcohol-induced persisting dementia: Secondary | ICD-10-CM | POA: Diagnosis present

## 2011-12-13 DIAGNOSIS — I959 Hypotension, unspecified: Secondary | ICD-10-CM | POA: Diagnosis present

## 2011-12-13 DIAGNOSIS — I8510 Secondary esophageal varices without bleeding: Secondary | ICD-10-CM | POA: Diagnosis present

## 2011-12-13 DIAGNOSIS — F17200 Nicotine dependence, unspecified, uncomplicated: Secondary | ICD-10-CM | POA: Diagnosis present

## 2011-12-13 DIAGNOSIS — R404 Transient alteration of awareness: Secondary | ICD-10-CM | POA: Diagnosis present

## 2011-12-13 DIAGNOSIS — K7460 Unspecified cirrhosis of liver: Secondary | ICD-10-CM | POA: Diagnosis present

## 2011-12-13 DIAGNOSIS — K922 Gastrointestinal hemorrhage, unspecified: Secondary | ICD-10-CM | POA: Diagnosis present

## 2011-12-13 DIAGNOSIS — R768 Other specified abnormal immunological findings in serum: Secondary | ICD-10-CM | POA: Diagnosis present

## 2011-12-13 DIAGNOSIS — K7010 Alcoholic hepatitis without ascites: Secondary | ICD-10-CM | POA: Diagnosis present

## 2011-12-13 DIAGNOSIS — K560 Paralytic ileus: Secondary | ICD-10-CM | POA: Diagnosis not present

## 2011-12-13 DIAGNOSIS — K7682 Hepatic encephalopathy: Secondary | ICD-10-CM | POA: Diagnosis present

## 2011-12-13 DIAGNOSIS — J4489 Other specified chronic obstructive pulmonary disease: Secondary | ICD-10-CM | POA: Diagnosis present

## 2011-12-13 DIAGNOSIS — N289 Disorder of kidney and ureter, unspecified: Secondary | ICD-10-CM | POA: Diagnosis present

## 2011-12-13 DIAGNOSIS — X58XXXA Exposure to other specified factors, initial encounter: Secondary | ICD-10-CM | POA: Diagnosis present

## 2011-12-13 DIAGNOSIS — R4182 Altered mental status, unspecified: Secondary | ICD-10-CM

## 2011-12-13 HISTORY — DX: Wedge compression fracture of first lumbar vertebra, initial encounter for closed fracture: S32.010A

## 2011-12-13 HISTORY — DX: Chronic or unspecified duodenal ulcer with hemorrhage: K26.4

## 2011-12-13 HISTORY — DX: Urinary tract infection, site not specified: N39.0

## 2011-12-13 HISTORY — DX: Hepatic encephalopathy: K76.82

## 2011-12-13 HISTORY — DX: Fatty (change of) liver, not elsewhere classified: K76.0

## 2011-12-13 HISTORY — DX: Alcohol dependence with alcohol-induced persisting dementia: F10.27

## 2011-12-13 HISTORY — DX: Unspecified cirrhosis of liver: K74.60

## 2011-12-13 HISTORY — DX: Hepatic failure, unspecified without coma: K72.90

## 2011-12-13 HISTORY — DX: Other specified abnormal immunological findings in serum: R76.8

## 2011-12-13 HISTORY — DX: Other psychoactive substance abuse, in remission: F19.11

## 2011-12-13 HISTORY — DX: Esophageal varices without bleeding: I85.00

## 2011-12-13 HISTORY — DX: Unspecified displaced fracture of second cervical vertebra, initial encounter for closed fracture: S12.100A

## 2011-12-13 LAB — DIFFERENTIAL
Basophils Absolute: 0.1 10*3/uL (ref 0.0–0.1)
Eosinophils Relative: 1 % (ref 0–5)
Lymphocytes Relative: 23 % (ref 12–46)
Lymphs Abs: 3.3 10*3/uL (ref 0.7–4.0)
Neutro Abs: 9.8 10*3/uL — ABNORMAL HIGH (ref 1.7–7.7)

## 2011-12-13 LAB — LACTIC ACID, PLASMA: Lactic Acid, Venous: 2.1 mmol/L (ref 0.5–2.2)

## 2011-12-13 LAB — ETHANOL: Alcohol, Ethyl (B): <11 mg/dL (ref 0–11)

## 2011-12-13 LAB — URINE MICROSCOPIC-ADD ON

## 2011-12-13 LAB — CBC
MCV: 122.2 fL — ABNORMAL HIGH (ref 78.0–100.0)
Platelets: 251 10*3/uL (ref 150–400)
RBC: 1.35 MIL/uL — ABNORMAL LOW (ref 4.22–5.81)
RDW: 19.9 % — ABNORMAL HIGH (ref 11.5–15.5)
WBC: 14.3 10*3/uL — ABNORMAL HIGH (ref 4.0–10.5)

## 2011-12-13 LAB — URINALYSIS, ROUTINE W REFLEX MICROSCOPIC
Ketones, ur: NEGATIVE mg/dL
Nitrite: POSITIVE — AB
Urobilinogen, UA: 4 mg/dL — ABNORMAL HIGH (ref 0.0–1.0)

## 2011-12-13 LAB — BLOOD GAS, ARTERIAL
FIO2: 21 %
O2 Saturation: 99.5 %
Patient temperature: 37
pH, Arterial: 7.499 — ABNORMAL HIGH (ref 7.350–7.450)

## 2011-12-13 LAB — PROTIME-INR
INR: 1.74 — ABNORMAL HIGH (ref 0.00–1.49)
Prothrombin Time: 20.7 seconds — ABNORMAL HIGH (ref 11.6–15.2)

## 2011-12-13 LAB — MRSA PCR SCREENING: MRSA by PCR: POSITIVE — AB

## 2011-12-13 LAB — RAPID URINE DRUG SCREEN, HOSP PERFORMED: Amphetamines: NOT DETECTED

## 2011-12-13 LAB — AMMONIA: Ammonia: 145 umol/L — ABNORMAL HIGH (ref 11–60)

## 2011-12-13 LAB — BASIC METABOLIC PANEL
CO2: 29 mEq/L (ref 19–32)
Chloride: 107 mEq/L (ref 96–112)
Glucose, Bld: 163 mg/dL — ABNORMAL HIGH (ref 70–99)
Potassium: 3.6 mEq/L (ref 3.5–5.1)
Sodium: 145 mEq/L (ref 135–145)

## 2011-12-13 LAB — HEPATIC FUNCTION PANEL
AST: 146 U/L — ABNORMAL HIGH (ref 0–37)
Bilirubin, Direct: 3.1 mg/dL — ABNORMAL HIGH (ref 0.0–0.3)
Total Bilirubin: 4.7 mg/dL — ABNORMAL HIGH (ref 0.3–1.2)

## 2011-12-13 LAB — PREPARE RBC (CROSSMATCH)

## 2011-12-13 LAB — PRO B NATRIURETIC PEPTIDE: Pro B Natriuretic peptide (BNP): 679.8 pg/mL — ABNORMAL HIGH (ref 0–125)

## 2011-12-13 LAB — TROPONIN I: Troponin I: <0.3 ng/mL (ref <0.30)

## 2011-12-13 LAB — PROCALCITONIN: Procalcitonin: 1.12 ng/mL

## 2011-12-13 LAB — CARDIAC PANEL(CRET KIN+CKTOT+MB+TROPI): Relative Index: INVALID (ref 0.0–2.5)

## 2011-12-13 LAB — HEMOGLOBIN AND HEMATOCRIT, BLOOD: Hemoglobin: 6.3 g/dL — CL (ref 13.0–17.0)

## 2011-12-13 MED ORDER — LORAZEPAM 0.5 MG PO TABS: 1.0000 mg | ORAL_TABLET | Freq: Four times a day (QID) | ORAL | Status: AC | PRN

## 2011-12-13 MED ORDER — CHLORHEXIDINE GLUCONATE 0.12 % MT SOLN
15.0000 mL | Freq: Two times a day (BID) | OROMUCOSAL | Status: DC
  Administered 2011-12-13 – 2011-12-18 (×11): 15 mL via OROMUCOSAL
  Filled 2011-12-13 (×11): qty 15

## 2011-12-13 MED ORDER — ALBUTEROL SULFATE (5 MG/ML) 0.5% IN NEBU: 2.5000 mg | INHALATION_SOLUTION | RESPIRATORY_TRACT | Status: DC | PRN

## 2011-12-13 MED ORDER — SODIUM CHLORIDE 0.9 % IV SOLN: Freq: Once | INTRAVENOUS | Status: DC

## 2011-12-13 MED ORDER — MUPIROCIN 2 % EX OINT
1.0000 "application " | TOPICAL_OINTMENT | Freq: Two times a day (BID) | CUTANEOUS | Status: AC
  Administered 2011-12-13 – 2011-12-18 (×9): 1 via NASAL
  Filled 2011-12-13: qty 22

## 2011-12-13 MED ORDER — SODIUM CHLORIDE 0.9 % IV SOLN
50.0000 ug/h | INTRAVENOUS | Status: DC
  Administered 2011-12-13 – 2011-12-15 (×5): 50 ug/h via INTRAVENOUS
  Filled 2011-12-13 (×13): qty 1

## 2011-12-13 MED ORDER — SODIUM CHLORIDE 0.9 % IV SOLN
Freq: Once | INTRAVENOUS | Status: AC
  Administered 2011-12-13: 10:00:00 via INTRAVENOUS

## 2011-12-13 MED ORDER — SODIUM CHLORIDE 0.9 % IV SOLN: INTRAVENOUS | Status: DC

## 2011-12-13 MED ORDER — FOLIC ACID 1 MG PO TABS: 1.0000 mg | ORAL_TABLET | Freq: Every day | ORAL | Status: DC

## 2011-12-13 MED ORDER — LORAZEPAM 2 MG/ML IJ SOLN
0.0000 mg | Freq: Four times a day (QID) | INTRAMUSCULAR | Status: AC
  Administered 2011-12-13 – 2011-12-14 (×2): 2 mg via INTRAVENOUS
  Administered 2011-12-14: 4 mg via INTRAVENOUS
  Administered 2011-12-14 – 2011-12-15 (×3): 2 mg via INTRAVENOUS
  Administered 2011-12-15: 4 mg via INTRAVENOUS
  Filled 2011-12-13 (×3): qty 1
  Filled 2011-12-13: qty 2
  Filled 2011-12-13 (×3): qty 1

## 2011-12-13 MED ORDER — PANTOPRAZOLE SODIUM 40 MG IV SOLR
40.0000 mg | Freq: Two times a day (BID) | INTRAVENOUS | Status: DC
  Administered 2011-12-13 – 2011-12-16 (×8): 40 mg via INTRAVENOUS
  Filled 2011-12-13 (×7): qty 40

## 2011-12-13 MED ORDER — FOLIC ACID 5 MG/ML IJ SOLN
1.0000 mg | Freq: Every day | INTRAMUSCULAR | Status: DC
  Administered 2011-12-14 – 2011-12-16 (×3): 1 mg via INTRAVENOUS
  Filled 2011-12-13 (×6): qty 0.2

## 2011-12-13 MED ORDER — POTASSIUM CHLORIDE IN NACL 20-0.9 MEQ/L-% IV SOLN
INTRAVENOUS | Status: DC
  Administered 2011-12-13: 16:00:00 via INTRAVENOUS
  Administered 2011-12-13 – 2011-12-14 (×2): 1000 mL via INTRAVENOUS

## 2011-12-13 MED ORDER — VITAMIN K1 10 MG/ML IJ SOLN
10.0000 mg | Freq: Once | INTRAVENOUS | Status: AC
  Administered 2011-12-13: 10 mg via INTRAVENOUS
  Filled 2011-12-13: qty 1

## 2011-12-13 MED ORDER — BIOTENE DRY MOUTH MT LIQD
15.0000 mL | Freq: Two times a day (BID) | OROMUCOSAL | Status: DC
  Administered 2011-12-13 – 2011-12-21 (×12): 15 mL via OROMUCOSAL

## 2011-12-13 MED ORDER — PNEUMOCOCCAL VAC POLYVALENT 25 MCG/0.5ML IJ INJ
0.5000 mL | INJECTION | INTRAMUSCULAR | Status: AC
  Administered 2011-12-14: 0.5 mL via INTRAMUSCULAR
  Filled 2011-12-13: qty 0.5

## 2011-12-13 MED ORDER — CHLORHEXIDINE GLUCONATE CLOTH 2 % EX PADS
6.0000 | MEDICATED_PAD | Freq: Every day | CUTANEOUS | Status: AC
  Administered 2011-12-14 – 2011-12-17 (×4): 6 via TOPICAL

## 2011-12-13 MED ORDER — ONDANSETRON HCL 4 MG PO TABS: 4.0000 mg | ORAL_TABLET | Freq: Four times a day (QID) | ORAL | Status: DC | PRN

## 2011-12-13 MED ORDER — LEVALBUTEROL HCL 0.63 MG/3ML IN NEBU
0.6300 mg | INHALATION_SOLUTION | Freq: Once | RESPIRATORY_TRACT | Status: DC
  Filled 2011-12-13: qty 3

## 2011-12-13 MED ORDER — MORPHINE SULFATE 2 MG/ML IJ SOLN
2.0000 mg | INTRAMUSCULAR | Status: DC | PRN
  Administered 2011-12-13 – 2011-12-18 (×26): 2 mg via INTRAVENOUS
  Filled 2011-12-13 (×29): qty 1

## 2011-12-13 MED ORDER — LEVALBUTEROL HCL 0.63 MG/3ML IN NEBU
0.6300 mg | INHALATION_SOLUTION | RESPIRATORY_TRACT | Status: DC | PRN
  Administered 2011-12-13 – 2011-12-20 (×12): 0.63 mg via RESPIRATORY_TRACT
  Filled 2011-12-13 (×8): qty 3

## 2011-12-13 MED ORDER — LORAZEPAM 2 MG/ML IJ SOLN
1.0000 mg | Freq: Four times a day (QID) | INTRAMUSCULAR | Status: AC | PRN
  Administered 2011-12-13: 2 mg via INTRAVENOUS
  Administered 2011-12-15 – 2011-12-16 (×4): 1 mg via INTRAVENOUS
  Filled 2011-12-13 (×5): qty 1

## 2011-12-13 MED ORDER — OCTREOTIDE LOAD VIA INFUSION
50.0000 ug | Freq: Once | INTRAVENOUS | Status: AC
  Administered 2011-12-13: 50 ug via INTRAVENOUS
  Filled 2011-12-13: qty 25

## 2011-12-13 MED ORDER — PANTOPRAZOLE SODIUM 40 MG IV SOLR
40.0000 mg | Freq: Once | INTRAVENOUS | Status: AC
  Administered 2011-12-13: 40 mg via INTRAVENOUS
  Filled 2011-12-13: qty 40

## 2011-12-13 MED ORDER — FLUTICASONE-SALMETEROL 250-50 MCG/DOSE IN AEPB
1.0000 | INHALATION_SPRAY | Freq: Two times a day (BID) | RESPIRATORY_TRACT | Status: DC
  Administered 2011-12-14 – 2011-12-21 (×11): 1 via RESPIRATORY_TRACT
  Filled 2011-12-13: qty 14

## 2011-12-13 MED ORDER — VITAMIN B-1 100 MG PO TABS
100.0000 mg | ORAL_TABLET | Freq: Every day | ORAL | Status: DC
  Administered 2011-12-17 – 2011-12-21 (×5): 100 mg via ORAL
  Filled 2011-12-13 (×5): qty 1

## 2011-12-13 MED ORDER — SODIUM CHLORIDE 0.9 % IV SOLN
Freq: Once | INTRAVENOUS | Status: AC
  Administered 2011-12-13: 250 mL via INTRAVENOUS

## 2011-12-13 MED ORDER — LORAZEPAM 2 MG/ML IJ SOLN
0.0000 mg | Freq: Two times a day (BID) | INTRAMUSCULAR | Status: AC
  Administered 2011-12-15: 2 mg via INTRAVENOUS
  Administered 2011-12-16: 1 mg via INTRAVENOUS
  Administered 2011-12-16: 2 mg via INTRAVENOUS
  Administered 2011-12-17: 4 mg via INTRAVENOUS
  Filled 2011-12-13 (×2): qty 1
  Filled 2011-12-13: qty 2
  Filled 2011-12-13: qty 1

## 2011-12-13 MED ORDER — ONDANSETRON HCL 4 MG/2ML IJ SOLN: 4.0000 mg | Freq: Four times a day (QID) | INTRAMUSCULAR | Status: DC | PRN

## 2011-12-13 MED ORDER — ADULT MULTIVITAMIN W/MINERALS CH: 1.0000 | ORAL_TABLET | Freq: Every day | ORAL | Status: DC

## 2011-12-13 MED ORDER — THIAMINE HCL 100 MG/ML IJ SOLN
100.0000 mg | Freq: Every day | INTRAMUSCULAR | Status: DC
  Administered 2011-12-13 – 2011-12-16 (×4): 100 mg via INTRAVENOUS
  Filled 2011-12-13 (×4): qty 2

## 2011-12-13 MED ORDER — DEXTROSE 5 % IV SOLN
1.0000 g | INTRAVENOUS | Status: DC
  Administered 2011-12-13 – 2011-12-15 (×3): 1 g via INTRAVENOUS
  Filled 2011-12-13 (×5): qty 10

## 2011-12-13 MED ORDER — SODIUM CHLORIDE 0.9 % IJ SOLN
INTRAMUSCULAR | Status: AC
  Filled 2011-12-13: qty 6

## 2011-12-13 NOTE — ED Notes (Signed)
bp 80/50 upon ems's arrival.  EMS administered 250cc bolus NSS bp increased to 112/80
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
## 2011-12-13 NOTE — Progress Notes (Signed)
Tachypneic, tachycardic, looks more uncomfortable.  Will repeat ABG, EKG, CXR, Cardiac enzymes, proBNP. Abdomen soft.  Slight wheeze.  Prognosis guarded.
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
## 2011-12-13 NOTE — ED Notes (Signed)
edp aware ct scanner down and may be around 230-3pm prior to getting scan done.
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
## 2011-12-13 NOTE — ED Notes (Signed)
Received report agree with previous assessment 

## 2011-12-13 NOTE — ED Notes (Signed)
CRITICAL VALUE ALERT  Critical value received:  hgb 5.3  Date of notification:  12/13/11  Time of notification:  1054  Critical value read back:yes  Nurse who received alert:  c Brain Honeycutt rn  MD notified (1st page):  Dr zammit  Time of first page:  1054  MD notified (2nd page):  Time of second page:  Responding MD:  Dr zammit  Time MD responded:  1054

## 2011-12-13 NOTE — ED Notes (Signed)
Pt denies any pain at present

## 2011-12-14 ENCOUNTER — Encounter (HOSPITAL_COMMUNITY): Payer: No Typology Code available for payment source

## 2011-12-14 ENCOUNTER — Encounter (HOSPITAL_COMMUNITY): Payer: Self-pay | Admitting: Internal Medicine

## 2011-12-14 ENCOUNTER — Inpatient Hospital Stay (HOSPITAL_COMMUNITY): Payer: Medicaid Other

## 2011-12-14 DIAGNOSIS — K922 Gastrointestinal hemorrhage, unspecified: Secondary | ICD-10-CM

## 2011-12-14 DIAGNOSIS — I959 Hypotension, unspecified: Secondary | ICD-10-CM | POA: Diagnosis not present

## 2011-12-14 DIAGNOSIS — R739 Hyperglycemia, unspecified: Secondary | ICD-10-CM | POA: Diagnosis present

## 2011-12-14 DIAGNOSIS — D62 Acute posthemorrhagic anemia: Secondary | ICD-10-CM

## 2011-12-14 DIAGNOSIS — E782 Mixed hyperlipidemia: Secondary | ICD-10-CM

## 2011-12-14 DIAGNOSIS — K7010 Alcoholic hepatitis without ascites: Secondary | ICD-10-CM | POA: Diagnosis present

## 2011-12-14 DIAGNOSIS — E870 Hyperosmolality and hypernatremia: Secondary | ICD-10-CM | POA: Diagnosis not present

## 2011-12-14 DIAGNOSIS — S32010A Wedge compression fracture of first lumbar vertebra, initial encounter for closed fracture: Secondary | ICD-10-CM

## 2011-12-14 DIAGNOSIS — K7290 Hepatic failure, unspecified without coma: Secondary | ICD-10-CM

## 2011-12-14 DIAGNOSIS — N179 Acute kidney failure, unspecified: Secondary | ICD-10-CM | POA: Diagnosis present

## 2011-12-14 DIAGNOSIS — K760 Fatty (change of) liver, not elsewhere classified: Secondary | ICD-10-CM

## 2011-12-14 HISTORY — DX: Fatty (change of) liver, not elsewhere classified: K76.0

## 2011-12-14 HISTORY — DX: Wedge compression fracture of first lumbar vertebra, initial encounter for closed fracture: S32.010A

## 2011-12-14 LAB — IRON AND TIBC
Iron: 105 ug/dL (ref 42–135)
UIBC: <15 ug/dL — ABNORMAL LOW (ref 125–400)

## 2011-12-14 LAB — FOLATE: Folate: 5.4 ng/mL

## 2011-12-14 LAB — COMPREHENSIVE METABOLIC PANEL
Albumin: 1.9 g/dL — ABNORMAL LOW (ref 3.5–5.2)
Alkaline Phosphatase: 120 U/L — ABNORMAL HIGH (ref 39–117)
BUN: 42 mg/dL — ABNORMAL HIGH (ref 6–23)
Potassium: 4.1 mEq/L (ref 3.5–5.1)
Sodium: 147 mEq/L — ABNORMAL HIGH (ref 135–145)
Total Protein: 5.6 g/dL — ABNORMAL LOW (ref 6.0–8.3)

## 2011-12-14 LAB — TSH: TSH: 1.483 u[IU]/mL (ref 0.350–4.500)

## 2011-12-14 LAB — HEPATITIS PANEL, ACUTE
Hep A IgM: NEGATIVE
Hep B C IgM: NEGATIVE
Hepatitis B Surface Ag: NEGATIVE

## 2011-12-14 LAB — CBC
HCT: 19.4 % — ABNORMAL LOW (ref 39.0–52.0)
MCV: 112.8 fL — ABNORMAL HIGH (ref 78.0–100.0)
Platelets: 158 10*3/uL (ref 150–400)
WBC: 23.8 10*3/uL — ABNORMAL HIGH (ref 4.0–10.5)

## 2011-12-14 LAB — HEMOGLOBIN AND HEMATOCRIT, BLOOD: Hemoglobin: 6.9 g/dL — CL (ref 13.0–17.0)

## 2011-12-14 LAB — PREPARE RBC (CROSSMATCH)

## 2011-12-14 LAB — APTT: aPTT: 33 seconds (ref 24–37)

## 2011-12-14 LAB — PROTIME-INR: INR: 1.64 — ABNORMAL HIGH (ref 0.00–1.49)

## 2011-12-14 MED ORDER — MOXIFLOXACIN HCL IN NACL 400 MG/250ML IV SOLN
400.0000 mg | INTRAVENOUS | Status: DC
  Administered 2011-12-14 – 2011-12-16 (×3): 400 mg via INTRAVENOUS
  Filled 2011-12-14 (×5): qty 250

## 2011-12-14 MED ORDER — LACTULOSE 10 GM/15ML PO SOLN
10.0000 g | Freq: Two times a day (BID) | ORAL | Status: DC
  Administered 2011-12-14 – 2011-12-21 (×14): 10 g via ORAL
  Filled 2011-12-14 (×14): qty 30

## 2011-12-14 MED ORDER — INSULIN ASPART 100 UNIT/ML ~~LOC~~ SOLN
0.0000 [IU] | Freq: Three times a day (TID) | SUBCUTANEOUS | Status: DC
  Administered 2011-12-14: 2 [IU] via SUBCUTANEOUS
  Administered 2011-12-15 – 2011-12-17 (×4): 1 [IU] via SUBCUTANEOUS

## 2011-12-14 MED ORDER — KCL IN DEXTROSE-NACL 20-5-0.45 MEQ/L-%-% IV SOLN
INTRAVENOUS | Status: DC
  Administered 2011-12-14: 11:00:00 via INTRAVENOUS
  Administered 2011-12-14 – 2011-12-15 (×2): 1000 mL via INTRAVENOUS

## 2011-12-14 MED ORDER — INSULIN ASPART 100 UNIT/ML ~~LOC~~ SOLN: 0.0000 [IU] | Freq: Every day | SUBCUTANEOUS | Status: DC

## 2011-12-14 MED ORDER — LACTULOSE ENEMA
300.0000 mL | Freq: Once | ORAL | Status: AC
  Administered 2011-12-14: 300 mL via RECTAL
  Filled 2011-12-14: qty 300

## 2011-12-14 MED ORDER — SODIUM CHLORIDE 0.9 % IJ SOLN
10.0000 mL | Freq: Two times a day (BID) | INTRAMUSCULAR | Status: DC
  Administered 2011-12-14: 20 mL
  Administered 2011-12-14 – 2011-12-19 (×9): 10 mL
  Filled 2011-12-14 (×2): qty 6
  Filled 2011-12-14 (×4): qty 3
  Filled 2011-12-14: qty 6
  Filled 2011-12-14 (×2): qty 3

## 2011-12-14 MED ORDER — SODIUM CHLORIDE 0.9 % IJ SOLN
10.0000 mL | INTRAMUSCULAR | Status: DC | PRN
  Administered 2011-12-16: 10 mL
  Filled 2011-12-14: qty 6
  Filled 2011-12-14 (×5): qty 3

## 2011-12-14 MED ORDER — FUROSEMIDE 10 MG/ML IJ SOLN
20.0000 mg | Freq: Once | INTRAMUSCULAR | Status: AC
  Administered 2011-12-14: 20 mg via INTRAVENOUS
  Filled 2011-12-14: qty 2

## 2011-12-14 NOTE — Progress Notes (Signed)
Pt had a large black stool with some obvious blood after lactulose enema. Lactulose was administered with flexiseal rectal tube, but tube dislodged when pt stooled. Dr Sherrie Mustache aware of blood in stool and HGB of 7.2.

## 2011-12-14 NOTE — Progress Notes (Signed)
UR Chart Review Completed  

## 2011-12-14 NOTE — Progress Notes (Signed)
Ultrasound technician was unable to obtain abdominal ultrasound due patient being uncooperative, despite premedication.

## 2011-12-14 NOTE — Plan of Care (Signed)
Problem: Consults Goal: General Medical Patient Education See Patient Education Module for specific education. Admitted with AMS, patient has remained nonverbal overnight,  No need for prn's throughout the night.  Hypotension continues,  Goal: Skin Care Protocol Initiated - if indicated If consults are not indicated, leave blank or document N/A Outcome: Progressing Multiple Stage 2 pressure ulcers to Bilat buttocks and inside crack of buttocks.  Replicare to all sites x7.  All measuring 1cm x 1cm Goal: Nutrition Consult-if indicated Outcome: Not Progressing Patient remains NPO for AMS  Problem: Phase I Progression Outcomes Goal: Pain controlled with appropriate interventions Outcome: Completed/Met Date Met:  12/14/11 No c/o pain Goal: OOB as tolerated unless otherwise ordered Outcome: Not Progressing Patient has remained on bedrest Goal: Hemodynamically stable Outcome: Not Progressing Patient has remained hypotensive tonight despite 2Liters of IVF and 2 units of FFP's and 3 Units of PRBC's

## 2011-12-14 NOTE — Clinical Social Work Note (Signed)
Unable to complete consult because patient was unable to communicate with CSW. Will reassess when patient conditions improves.  Per RN, family has asked about applying for Medicaid.  Called son who said that brother English Craighead [phone]) has power of attorney.  Left message for Ulysse Siemen (listed as Sam in chart) with contact information for Haroldine Laws (financial counselor - [phone]).  [name] Clinical Social Worker [phone])

## 2011-12-14 NOTE — Progress Notes (Signed)
CRITICAL VALUE ALERT  Critical value received:  6.9  Date of notification:  12/14/11  Time of notification:  1753  Critical value read back:yes  Nurse who received alert:  Lovena Neighbours RN  MD notified (1st page):  Dr Sherrie Mustache  Time of first page:  1753  MD notified (2nd page):n/a  Time of second page:n/a  Responding MD:  Dr Sherrie Mustache  Time MD responded:  [phone]

## 2011-12-14 NOTE — Progress Notes (Signed)
Patient is alert today and responds to questions verbally but difficult to comprehend his speech. He states he has abdominal pain but unable to tell me where. According to nursing staff had to tarry stools today. No hematemesis or vomiting reported. Patient had abdominopelvic CT last evening revealing mild ascites fatty liver and possible gallbladder sludge. Objective; BP 98/62  Pulse 82  Temp(Src) 97.1 F (36.2 C) (Axillary)  Resp 23  Ht 6\' 1"  (1.854 m)  Wt 241 lb 13.5 oz (109.7 kg)  BMI 31.91 kg/m2  SpO2 98% Patient is alert and responds to questions appropriately. He has tremors and asterixis. Abdomen is protuberant but soft with tenderness and right upper quadrant. No organomegaly or masses. No LE edema noted. Is/Os last 24 hours ending at 7 AM. Intake 5553 mL. Urine output 1400 mL. Lab data; INR 1.64. Patient  received 2 units of FFP last night. WBC 23.8, hemoglobin 6.1, hemoglobin 19.4. Platelet count 158K. H&H after 30 units of PRBC 7.4 and 22. Bilirubin 5.6, AP 120, AST 124, ALT 39, albumin 1.9. BUN 42 and creatinine 1.5. Hepatitis B surface antigen negative, hepatitis A IgM antibody negative, hepatitis B core  IgM antibody negative; HCV antibody negative. Blood cultures negative Urine culture negative so far MRSA positive(nasal swab). Assessment; Significant improvement in patient's condition in the last 24 hours. Tachycapnea has resolved. 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 Mental status has improved. Renal function is stable but not normal. Urinary output satisfactory. Coagulopathy has improved with FFP and/or vitamin K. Anemia secondary to GI bleed. Patient has received 3 units of PRBCs and his hemoglobin is up from 5.3 g to 7.4. he isn't IV PPI and octreotide infusion. Recommendations; Sips of clear liquid. Lactulose 10 g by mouth twice a day. Will evaluate patient in a.m. for possible esophagogastroduodenoscopy. INR  in a.m.Marland Kitchen

## 2011-12-14 NOTE — Progress Notes (Addendum)
Subjective: Events noted overnight for low blood pressures. Also, reportedly, the nurse saw bruising on his lower back. The on call physician, Dr. Orvan Falconer, ordered a CT of his abdomen and pelvis to rule out a retroperitoneal bleed. It was negative for bleeding. Currently, the patient is semi-alert. He mumbles intermittently.  Objective: Vital signs in last 24 hours: Filed Vitals:   12/14/11 0730 12/14/11 0745 12/14/11 0752 12/14/11 0800  BP: 97/52 108/74 93/48   Pulse: 83 86 82 80  Temp:      TempSrc:      Resp: 18 24 24    Height:      Weight:      SpO2:        Intake/Output Summary (Last 24 hours) at 12/14/11 0823 Last data filed at 12/14/11 4098  Gross per 24 hour  Intake 5352.24 ml  Output   1401 ml  Net 3951.24 ml    Weight change:   Physical exam: General: Disheveled 66 year old Caucasian man, lying in bed, with his eyes closed. He starts crying and asks for his daddy. Lungs: Few bronchus breath sounds. Heart: S1, S2, with no audible murmurs rubs or gallops. Abdomen: Obese, positive bowel sounds, slightly distended, diffusely tender, no masses palpated. Extremities: No pedal edema. Neuro/psychiatric: He is semi-alert. He does tell me that he is in [facility]. He cries for his daddy. Otherwise, he does not follow directions or he refuses to follow directions. He withdrawals to stimuli. Skin: Small area of ecchymosis on the lower back, mildly tender.  Lab Results: Basic Metabolic Panel:  Basename 12/14/11 0523 12/13/11 1031  NA 147* 145  K 4.1 3.6  CL 110 107  CO2 22 29  GLUCOSE 202* 163*  BUN 42* 44*  CREATININE 1.50* 1.50*  CALCIUM 6.8* 7.6*  MG -- --  PHOS -- --   Liver Function Tests:  [facility] 12/14/11 0523 12/13/11 1031  AST 124* 146*  ALT 39 45  ALKPHOS 120* 122*  BILITOT 5.6* 4.7*  PROT 5.6* 6.3  ALBUMIN 1.9* 1.9*   No results found for this basename: LIPASE:2,AMYLASE:2 in the last 72 hours  Basename 12/13/11 1031  AMMONIA 145*    CBC:  Basename 12/14/11 0523 12/13/11 2217 12/13/11 1031  WBC 23.8* -- 14.3*  NEUTROABS -- -- 9.8*  HGB 6.1* 6.3* --  HCT 19.4* 18.9* --  MCV 112.8* -- 122.2*  PLT 158 -- 251   Cardiac Enzymes:  Basename 12/13/11 2217 12/13/11 1031  CKTOTAL 41 --  CKMB 1.4 --  CKMBINDEX -- --  TROPONINI <0.30 <0.30   BNP:  Basename 12/13/11 2217  PROBNP 679.8*   D-Dimer: No results found for this basename: DDIMER:2 in the last 72 hours CBG: No results found for this basename: GLUCAP:6 in the last 72 hours Hemoglobin A1C: No results found for this basename: HGBA1C in the last 72 hours Fasting Lipid Panel: No results found for this basename: CHOL,HDL,LDLCALC,TRIG,CHOLHDL,LDLDIRECT in the last 72 hours Thyroid Function Tests:  Basename 12/13/11 1136  TSH 1.483  T4TOTAL --  FREET4 --  T3FREE --  THYROIDAB --   Anemia Panel:  Basename 12/13/11 1145  VITAMINB12 1590*  FOLATE 5.4  FERRITIN 1582*  TIBC NOT CALC  IRON 105  RETICCTPCT 11.5*   Coagulation:  Basename 12/14/11 0523 12/13/11 1031  LABPROT 19.7* 20.7*  INR 1.64* 1.74*   Urine Drug Screen: Drugs of Abuse     Component Value Date/Time   LABOPIA NONE DETECTED 12/13/2011 1102   COCAINSCRNUR NONE DETECTED 12/13/2011 1102  LABBENZ POSITIVE* 12/13/2011 1102   AMPHETMU NONE DETECTED 12/13/2011 1102   THCU NONE DETECTED 12/13/2011 1102   LABBARB NONE DETECTED 12/13/2011 1102    Alcohol Level:  Basename 12/13/11 1031  ETH <11   Urinalysis:  Basename 12/13/11 1102  COLORURINE AMBER*  LABSPEC 1.010  PHURINE 6.0  GLUCOSEU NEGATIVE  HGBUR LARGE*  BILIRUBINUR MODERATE*  KETONESUR NEGATIVE  PROTEINUR 30*  UROBILINOGEN 4.0*  NITRITE POSITIVE*  LEUKOCYTESUR SMALL*   Misc. Labs:   Micro: Recent Results (from the past 240 hour(s))  CULTURE, BLOOD (ROUTINE X 2)     Status: Normal (Preliminary result)   Collection Time   12/13/11 10:32 AM      Component Value Range Status Comment   Specimen Description Blood    Final    Special Requests NONE   Final    Culture NO GROWTH <24 HRS   Final    Report Status PENDING   Incomplete   CULTURE, BLOOD (ROUTINE X 2)     Status: Normal (Preliminary result)   Collection Time   12/13/11 10:33 AM      Component Value Range Status Comment   Specimen Description Blood   Final    Special Requests NONE   Final    Culture NO GROWTH <24 HRS   Final    Report Status PENDING   Incomplete   MRSA PCR SCREENING     Status: Abnormal   Collection Time   12/13/11  3:04 PM      Component Value Range Status Comment   MRSA by PCR POSITIVE (*) NEGATIVE  Final     Studies/Results: Ct Abdomen Pelvis Wo Contrast  12/14/2011  *RADIOLOGY REPORT*  Clinical Data: Back pain, swelling, bruising, and bleeding.  CT ABDOMEN AND PELVIS WITHOUT CONTRAST  Technique:  Multidetector CT imaging of the abdomen and pelvis was performed following the standard protocol without intravenous contrast.  Comparison: None.  Findings: Consolidation or atelectasis in both lung bases. Pneumonia may be present.  Respiratory motion artifact limits visualization.  Diffuse low attenuation change throughout the liver with enlargement consistent with fatty infiltration.  Spleen size is normal.  It is difficult to resolve, there appears to be fluid around the liver edge as well as around the spleen.  Fluid tracks along the right pericolic gutter into the pelvis.  Low Hounsfield unit measurements of the fluid suggesting ascites.  The no obvious hemorrhage.  The gallbladder is somewhat hyperdense which may be due to sludge.  No gallbladder wall thickening.  Pancreas and adrenal glands are unremarkable.  No hydronephrosis or stone in either kidney.  Calcification of the abdominal aorta without aneurysm.  Prominent visceral adipose tissues.  No free air in the abdomen.  The stomach, small bowel, and colon are decompressed.  Pelvis:  Foley catheter decompresses the bladder.  Bladder wall thickening is not excluded.  No significant  pelvic lymphadenopathy. The appendix is not visualized.  Pelvic phleboliths.  No evidence of diverticulitis.  Fat in the inguinal canals.  Degenerative changes in the lumbar spine with anterior wedging of T12 and L1 vertebra.  There is associated sclerosis and hypertrophic changes suggesting that these are likely chronic.  No paraspinal soft tissue hematomas.  IMPRESSION: Consolidation or atelectasis in both lung bases.  Diffuse fatty infiltration of the liver.  Increased density in the gallbladder suggesting sludge.  Mild abdominal and pelvic ascites.  Compression fractures of T12 and L1 which appear chronic.  Foley catheter in the bladder.  Original Report Authenticated By:  Marlon Pel, M.D.   Ct Head Wo Contrast  12/13/2011  *RADIOLOGY REPORT*  Clinical Data: Altered mental status.  Weakness, confusion.  CT HEAD WITHOUT CONTRAST  Technique:  Contiguous axial images were obtained from the base of the skull through the vertex without contrast.  Comparison: 08/17/2011  Findings: Mild generalized atrophy.  No change. No acute intracranial abnormality.  Specifically, no hemorrhage, hydrocephalus, mass lesion, acute infarction, or significant intracranial injury.  No acute calvarial abnormality. Visualized paranasal sinuses and mastoids clear.  Orbital soft tissues unremarkable.  IMPRESSION: No acute intracranial abnormality.  Original Report Authenticated By: Cyndie Chime, M.D.   Dg Chest Portable 1 View  12/13/2011  *RADIOLOGY REPORT*  Clinical Data: Altered mental status, hypotension  PORTABLE CHEST - 1 VIEW  Comparison: Portable chest x-ray of 08/17/2011  Findings: The lungs are clear.  Minimal right basilar linear atelectasis is present.  Cardiomegaly is stable.  No bony abnormality is seen.  IMPRESSION: Stable cardiomegaly.  Mild right basilar linear atelectasis.  Original Report Authenticated By: Juline Patch, M.D.   Dg Chest Port 1v Same Day  12/13/2011  *RADIOLOGY REPORT*  Clinical Data:  Shortness of breath, hypoxia.  PORTABLE CHEST - 1 VIEW SAME DAY  Comparison: 12/13/2011.  Findings: Trachea is midline.  Heart size stable.  Lungs are somewhat low in volume with atelectasis at the right lung base. Underlying scarring is seen as well.  Minimal left basilar atelectasis.  No definite pleural fluid.  IMPRESSION: Bibasilar atelectasis, right greater than left.  Original Report Authenticated By: Reyes Ivan, M.D.    Medications:  Scheduled:   . sodium chloride   Intravenous Once  . sodium chloride   Intravenous Once  . antiseptic oral rinse  15 mL Mouth Rinse q12n4p  . cefTRIAXone (ROCEPHIN)  IV  1 g Intravenous Q24H  . chlorhexidine  15 mL Mouth Rinse BID  . Chlorhexidine Gluconate Cloth  6 each Topical Q0600  . Fluticasone-Salmeterol  1 puff Inhalation Q12H  . folic acid  1 mg Intravenous Daily  . levalbuterol  0.63 mg Nebulization Once  . LORazepam  0-4 mg Intravenous Q6H   Followed by  . LORazepam  0-4 mg Intravenous Q12H  . mupirocin ointment  1 application Nasal BID  . octreotide  50 mcg Intravenous Once  . pantoprazole  40 mg Intravenous Once  . pantoprazole (PROTONIX) IV  40 mg Intravenous Q12H  . phytonadione (VITAMIN K) IV  10 mg Intravenous Once  . pneumococcal 23 valent vaccine  0.5 mL Intramuscular Tomorrow-1000  . sodium chloride      . thiamine  100 mg Oral Daily   Or  . thiamine  100 mg Intravenous Daily  . DISCONTD: sodium chloride   Intravenous Once  . DISCONTD: sodium chloride   Intravenous STAT  . DISCONTD: sodium chloride   Intravenous Once  . DISCONTD: folic acid  1 mg Oral Daily  . DISCONTD: mulitivitamin with minerals  1 tablet Oral Daily   Continuous:   . 0.9 % NaCl with KCl 20 mEq / L 100 mL/hr at 12/14/11 0642  . octreotide (SANDOSTATIN) infusion 50 mcg/hr (12/14/11 0010)   RUE:[id number], LORazepam, LORazepam, morphine injection, ondansetron (ZOFRAN) IV, ondansetron, DISCONTD: albuterol  Assessment: Principal Problem:  *GI  bleed Active Problems:  Smoker, current status unknown  Acute blood loss anemia  Alcohol abuse  Jaundice  Coagulopathy  Macrocytic anemia  Renal insufficiency  Hepatic encephalopathy  UTI (urinary tract infection)  Debility  Frequent falls  Hepatic steatosis  Compression fracture of L1 lumbar vertebra  Hypocalcemia  Acute renal failure  Hypernatremia  Hypotension     1. GI bleed with melena. Likely upper GI bleed. He is on octreotide and IV Protonix. Dr. Karilyn Cota is following.  Acute blood loss anemia. He has macrocytosis with an elevated vitamin B12 level and borderline low folate level. He is status post 3 units of packed red blood cells, including 1 unit this morning. His hemoglobin has increased just 1 g. There was some bruising on his lower back noted. CT scan of his abdomen and pelvis did not reveal a retroperitoneal bleed.  Hypotension, likely secondary to hypovolemia. He is on IV fluids accordingly.  Alcoholic liver disease with jaundice, alcoholic hepatitis. Also noted on CT was diffuse fatty infiltration of the liver. He also has gallbladder sludge and mild abdominal and pelvic ascites. Ultrasound of his abdomen is pending. He is on vitamin therapy and the Ativan alcohol withdrawal protocol.   Coagulopathy. He is status post 2 units of fresh frozen plasma and vitamin K.  Hepatic encephalopathy. We'll treat with lactulose accordingly.  Diffuse abdominal tenderness. No acute changes noted on the CT of his abdomen and pelvis. This will continue to be followed.  Urinary tract infection. He is on Rocephin.  Increasing leukocytosis. This could be secondary to pneumonia given the findings on the CT of his abdomen and pelvis. We'll get a dedicated chest x-ray for further evaluation.  Hypocalcemia secondary to hypoalbuminemia.  Hypernatremia, will assume secondary to hypovolemia although the patient has received aggressive IV fluids.  Hyperglycemia. No known prior history  of diabetes.  Acute renal failure, secondary to prerenal azotemia. In March, his creatinine was within normal limits.   Plan:  1. We'll start lactulose by mouth or per rectum. 2. We'll add Avelox to Rocephin for treatment of possible pneumonia. 3. Change IV fluids to D5 half-normal saline. Add sliding scale NovoLog. We'll check his hemoglobin A1c. 4. Continue to monitor his hemoglobin and hematocrit.  Total ICU time 45 minutes.   LOS: 1 day   Lily Velasquez 12/14/2011, 8:23 AM

## 2011-12-15 ENCOUNTER — Encounter (HOSPITAL_COMMUNITY)
Admission: EM | Disposition: A | Discharge status: Skilled Nursing Facility | Payer: Self-pay | Source: Home / Self Care | Attending: Internal Medicine

## 2011-12-15 ENCOUNTER — Inpatient Hospital Stay (HOSPITAL_COMMUNITY): Payer: Medicaid Other | Admitting: Anesthesiology

## 2011-12-15 ENCOUNTER — Inpatient Hospital Stay (HOSPITAL_COMMUNITY): Payer: Medicaid Other

## 2011-12-15 ENCOUNTER — Encounter (HOSPITAL_COMMUNITY): Payer: Self-pay | Admitting: Anesthesiology

## 2011-12-15 ENCOUNTER — Encounter (HOSPITAL_COMMUNITY): Payer: Self-pay | Admitting: *Deleted

## 2011-12-15 DIAGNOSIS — E782 Mixed hyperlipidemia: Secondary | ICD-10-CM

## 2011-12-15 DIAGNOSIS — K7682 Hepatic encephalopathy: Secondary | ICD-10-CM

## 2011-12-15 DIAGNOSIS — K264 Chronic or unspecified duodenal ulcer with hemorrhage: Secondary | ICD-10-CM

## 2011-12-15 DIAGNOSIS — I8500 Esophageal varices without bleeding: Secondary | ICD-10-CM

## 2011-12-15 DIAGNOSIS — R1084 Generalized abdominal pain: Secondary | ICD-10-CM | POA: Diagnosis present

## 2011-12-15 DIAGNOSIS — K7290 Hepatic failure, unspecified without coma: Secondary | ICD-10-CM

## 2011-12-15 DIAGNOSIS — K922 Gastrointestinal hemorrhage, unspecified: Secondary | ICD-10-CM

## 2011-12-15 DIAGNOSIS — I8510 Secondary esophageal varices without bleeding: Secondary | ICD-10-CM

## 2011-12-15 DIAGNOSIS — D62 Acute posthemorrhagic anemia: Secondary | ICD-10-CM

## 2011-12-15 DIAGNOSIS — K766 Portal hypertension: Secondary | ICD-10-CM

## 2011-12-15 DIAGNOSIS — K219 Gastro-esophageal reflux disease without esophagitis: Secondary | ICD-10-CM

## 2011-12-15 DIAGNOSIS — D696 Thrombocytopenia, unspecified: Secondary | ICD-10-CM | POA: Diagnosis not present

## 2011-12-15 DIAGNOSIS — K259 Gastric ulcer, unspecified as acute or chronic, without hemorrhage or perforation: Secondary | ICD-10-CM

## 2011-12-15 HISTORY — DX: Esophageal varices without bleeding: I85.00

## 2011-12-15 HISTORY — DX: Chronic or unspecified duodenal ulcer with hemorrhage: K26.4

## 2011-12-15 LAB — PREPARE FRESH FROZEN PLASMA
Unit division: 0
Unit division: 0

## 2011-12-15 LAB — COMPREHENSIVE METABOLIC PANEL
AST: 110 U/L — ABNORMAL HIGH (ref 0–37)
Albumin: 1.9 g/dL — ABNORMAL LOW (ref 3.5–5.2)
Alkaline Phosphatase: 95 U/L (ref 39–117)
BUN: 32 mg/dL — ABNORMAL HIGH (ref 6–23)
CO2: 23 mEq/L (ref 19–32)
Chloride: 114 mEq/L — ABNORMAL HIGH (ref 96–112)
Creatinine, Ser: 1.22 mg/dL (ref 0.50–1.35)
GFR calc non Af Amer: 61 mL/min — ABNORMAL LOW (ref >90)
Potassium: 3.4 mEq/L — ABNORMAL LOW (ref 3.5–5.1)
Total Bilirubin: 4.3 mg/dL — ABNORMAL HIGH (ref 0.3–1.2)

## 2011-12-15 LAB — CBC
HCT: 25.8 % — ABNORMAL LOW (ref 39.0–52.0)
MCH: 34.3 pg — ABNORMAL HIGH (ref 26.0–34.0)
MCV: 101.6 fL — ABNORMAL HIGH (ref 78.0–100.0)
Platelets: 120 10*3/uL — ABNORMAL LOW (ref 150–400)
RDW: 28.4 % — ABNORMAL HIGH (ref 11.5–15.5)
RDW: 28.5 % — ABNORMAL HIGH (ref 11.5–15.5)
WBC: 10.8 10*3/uL — ABNORMAL HIGH (ref 4.0–10.5)
WBC: 9.5 10*3/uL (ref 4.0–10.5)

## 2011-12-15 LAB — HEMOGLOBIN A1C
Hgb A1c MFr Bld: 4.8 % (ref <5.7)
Mean Plasma Glucose: 91 mg/dL (ref <117)

## 2011-12-15 LAB — GLUCOSE, CAPILLARY: Glucose-Capillary: 131 mg/dL — ABNORMAL HIGH (ref 70–99)

## 2011-12-15 LAB — PROTIME-INR: Prothrombin Time: 19.6 seconds — ABNORMAL HIGH (ref 11.6–15.2)

## 2011-12-15 SURGERY — ESOPHAGOGASTRODUODENOSCOPY (EGD) WITH PROPOFOL
Anesthesia: Monitor Anesthesia Care

## 2011-12-15 MED ORDER — MIDAZOLAM HCL 2 MG/2ML IJ SOLN
INTRAMUSCULAR | Status: AC
  Filled 2011-12-15: qty 2

## 2011-12-15 MED ORDER — LEVALBUTEROL HCL 0.63 MG/3ML IN NEBU
0.6300 mg | INHALATION_SOLUTION | Freq: Four times a day (QID) | RESPIRATORY_TRACT | Status: DC
  Administered 2011-12-15 – 2011-12-21 (×18): 0.63 mg via RESPIRATORY_TRACT
  Filled 2011-12-15 (×19): qty 3

## 2011-12-15 MED ORDER — PRO-STAT SUGAR FREE PO LIQD
30.0000 mL | Freq: Three times a day (TID) | ORAL | Status: DC
  Administered 2011-12-15 – 2011-12-21 (×15): 30 mL via ORAL
  Filled 2011-12-15 (×14): qty 30

## 2011-12-15 MED ORDER — LACTATED RINGERS IV SOLN
INTRAVENOUS | Status: DC
  Administered 2011-12-15: 10:00:00 via INTRAVENOUS

## 2011-12-15 MED ORDER — GLYCOPYRROLATE 0.2 MG/ML IJ SOLN
INTRAMUSCULAR | Status: AC
  Filled 2011-12-15: qty 1

## 2011-12-15 MED ORDER — FENTANYL CITRATE 0.05 MG/ML IJ SOLN: 25.0000 ug | INTRAMUSCULAR | Status: DC | PRN

## 2011-12-15 MED ORDER — SODIUM CHLORIDE 0.9 % IV SOLN
25.0000 ug/h | INTRAVENOUS | Status: DC
  Filled 2011-12-15 (×4): qty 1

## 2011-12-15 MED ORDER — GLYCOPYRROLATE 0.2 MG/ML IJ SOLN
0.2000 mg | Freq: Once | INTRAMUSCULAR | Status: AC
  Administered 2011-12-15: 0.2 mg via INTRAVENOUS

## 2011-12-15 MED ORDER — FENTANYL CITRATE 0.05 MG/ML IJ SOLN
INTRAMUSCULAR | Status: DC | PRN
  Administered 2011-12-15: 50 ug via INTRAVENOUS

## 2011-12-15 MED ORDER — POTASSIUM CHLORIDE 2 MEQ/ML IV SOLN
INTRAVENOUS | Status: DC
  Administered 2011-12-15 – 2011-12-16 (×2): via INTRAVENOUS
  Filled 2011-12-15 (×6): qty 1000

## 2011-12-15 MED ORDER — ACETAMINOPHEN 325 MG PO TABS: 325.0000 mg | ORAL_TABLET | ORAL | Status: DC | PRN

## 2011-12-15 MED ORDER — PROPOFOL 10 MG/ML IV EMUL
INTRAVENOUS | Status: DC | PRN
  Administered 2011-12-15: 50 ug/kg/min via INTRAVENOUS

## 2011-12-15 MED ORDER — STERILE WATER FOR IRRIGATION IR SOLN
Status: DC | PRN
  Administered 2011-12-15: 11:00:00

## 2011-12-15 MED ORDER — STERILE WATER FOR IRRIGATION IR SOLN
Status: DC | PRN
  Administered 2011-12-15: 1000 mL

## 2011-12-15 MED ORDER — MIDAZOLAM HCL 2 MG/2ML IJ SOLN
1.0000 mg | INTRAMUSCULAR | Status: DC | PRN
  Administered 2011-12-15 (×3): 2 mg via INTRAVENOUS

## 2011-12-15 MED ORDER — FENTANYL CITRATE 0.05 MG/ML IJ SOLN
INTRAMUSCULAR | Status: AC
  Filled 2011-12-15: qty 2

## 2011-12-15 MED ORDER — PROPOFOL 10 MG/ML IV BOLUS
INTRAVENOUS | Status: DC | PRN
  Administered 2011-12-15 (×2): 20 mg via INTRAVENOUS

## 2011-12-15 MED ORDER — ONDANSETRON HCL 4 MG/2ML IJ SOLN: 4.0000 mg | Freq: Once | INTRAMUSCULAR | Status: DC | PRN

## 2011-12-15 MED ORDER — PROPOFOL 10 MG/ML IV EMUL
INTRAVENOUS | Status: AC
  Filled 2011-12-15: qty 20

## 2011-12-15 SURGICAL SUPPLY — 20 items
BLOCK BITE 60FR ADLT L/F BLUE (MISCELLANEOUS) ×2 IMPLANT
ELECT REM PT RETURN 9FT ADLT (ELECTROSURGICAL)
ELECTRODE REM PT RTRN 9FT ADLT (ELECTROSURGICAL) IMPLANT
FLOOR PAD 36X40 (MISCELLANEOUS) ×2
FORCEP COLD BIOPSY (CUTTING FORCEPS) IMPLANT
FORCEP RJ3 GP 1.8X160 W-NEEDLE (CUTTING FORCEPS) IMPLANT
FORCEPS BIOP RAD 4 LRG CAP 4 (CUTTING FORCEPS) IMPLANT
MANIFOLD NEPTUNE II (INSTRUMENTS) ×1 IMPLANT
MANIFOLD NEPTUNE WASTE (CANNULA) ×1 IMPLANT
NDL SCLEROTHERAPY 25GX240 (NEEDLE) IMPLANT
NEEDLE SCLEROTHERAPY 25GX240 (NEEDLE) IMPLANT
PAD FLOOR 36X40 (MISCELLANEOUS) IMPLANT
PROBE APC STR FIRE (PROBE) IMPLANT
PROBE INJECTION GOLD (MISCELLANEOUS)
PROBE INJECTION GOLD 7FR (MISCELLANEOUS) IMPLANT
SNARE ROTATE MED OVAL 20MM (MISCELLANEOUS) IMPLANT
SYR 50ML LL SCALE MARK (SYRINGE) ×1 IMPLANT
TUBING ENDO SMARTCAP PENTAX (MISCELLANEOUS) ×2 IMPLANT
TUBING IRRIGATION ENDOGATOR (MISCELLANEOUS) ×2 IMPLANT
WATER STERILE IRR 1000ML POUR (IV SOLUTION) ×1 IMPLANT

## 2011-12-15 NOTE — Anesthesia Preprocedure Evaluation (Signed)
Anesthesia Evaluation  Patient identified by MRN, date of birth, ID band Patient awake    Reviewed: Allergy & Precautions, H&P , NPO status , Patient's Chart, lab work & pertinent test results  Airway Mallampati: III TM Distance: >3 FB Neck ROM: Full  Mouth opening: Limited Mouth Opening  Dental  (+) Poor Dentition   Pulmonary pneumonia , COPDCurrent Smoker,    Pulmonary exam normal       Cardiovascular hypertension, Pt. on medications + Cardiac Stents Rhythm:Regular Rate:Normal     Neuro/Psych    GI/Hepatic GERD-  ,(+)     substance abuse  alcohol use, Hepatitis -  Endo/Other    Renal/GU      Musculoskeletal   Abdominal (+) + obese,   Peds  Hematology  (+) Blood dyscrasia, anemia ,   Anesthesia Other Findings   Reproductive/Obstetrics                           Anesthesia Physical Anesthesia Plan  ASA: III  Anesthesia Plan: MAC   Post-op Pain Management:    Induction: Intravenous  Airway Management Planned: Nasal Cannula and Mask  Additional Equipment:   Intra-op Plan:   Post-operative Plan:   Informed Consent: I have reviewed the patients History and Physical, chart, labs and discussed the procedure including the risks, benefits and alternatives for the proposed anesthesia with the patient or authorized representative who has indicated his/her understanding and acceptance.     Plan Discussed with: CRNA  Anesthesia Plan Comments: (Propofol sedation. Might need ketamine supplement if pt gets delirious or combative with propofol. Can give ketamine 50 mg IV followed by 10 mg increments as needed.)        Anesthesia Quick Evaluation

## 2011-12-15 NOTE — Transfer of Care (Signed)
Immediate Anesthesia Transfer of Care Note  Patient: Roberto [name]  Procedure(s) Performed: Procedure(s) (LRB): ESOPHAGOGASTRODUODENOSCOPY (EGD) WITH PROPOFOL (N/A)  Patient Location: PACU  Anesthesia Type: MAC  Level of Consciousness: awake  Airway & Oxygen Therapy: Patient Spontanous Breathing and Patient connected to face mask oxygen  Post-op Assessment: Report given to PACU RN  Post vital signs: Reviewed and stable  Complications: No apparent anesthesia complications

## 2011-12-15 NOTE — Progress Notes (Signed)
Subjective: The patient had a large bloody bowel movement following the lactulose enema yesterday. He mumbles but complains of abdominal pain, after palpation of his abdomen. He is still intermittently confused and agitated per nursing.   Objective: Vital signs in last 24 hours: Filed Vitals:   12/15/11 0500 12/15/11 0600 12/15/11 0655 12/15/11 0842  BP: 127/76 115/62  133/81  Pulse: 81 76  79  Temp:      TempSrc:      Resp: 20 18    Height:      Weight: 111.5 kg (245 lb 13 oz)     SpO2: 95% 94% 95%     Intake/Output Summary (Last 24 hours) at 12/15/11 0851 Last data filed at 12/15/11 0600  Gross per 24 hour  Intake 4150.34 ml  Output   1350 ml  Net 2800.34 ml    Weight change: 7.3 kg (16 lb 1.5 oz)  Physical exam: Lungs: Bilateral rhonchus wheezing. Heart: S1, S2, with no audible murmurs rubs or gallops. Abdomen: Obese, positive bowel sounds, slightly distended, diffusely tender, no masses palpated. Extremities: No pedal edema. Neuro/psychiatric: He is semi-alert. He mumbles with a few intelligible words spoken. He does not or refuses to follow commands. He is moving all of his extremities spontaneously. He winces when his abdomen is palpated diffusely. Then he points to his right upper quadrant. Skin: Small area of ecchymosis on the lower back, mildly tender.  Lab Results: Basic Metabolic Panel:  Basename 12/15/11 0512 12/14/11 0523  NA 146* 147*  K 3.4* 4.1  CL 114* 110  CO2 23 22  GLUCOSE 164* 202*  BUN 32* 42*  CREATININE 1.22 1.50*  CALCIUM 6.9* 6.8*  MG -- --  PHOS -- --   Liver Function Tests:  [facility] 12/15/11 0512 12/14/11 0523  AST 110* 124*  ALT 35 39  ALKPHOS 95 120*  BILITOT 4.3* 5.6*  PROT 5.9* 5.6*  ALBUMIN 1.9* 1.9*   No results found for this basename: LIPASE:2,AMYLASE:2 in the last 72 hours  Basename 12/13/11 1031  AMMONIA 145*   CBC:  Basename 12/15/11 0728 12/15/11 0030 12/14/11 0523 12/13/11 1031  WBC 10.8* -- 23.8* --    NEUTROABS -- -- -- 9.8*  HGB 8.7* 8.4* -- --  HCT 25.8* 24.7* -- --  MCV 101.6* -- 112.8* --  PLT 119* -- 158 --   Cardiac Enzymes:  Basename 12/13/11 2217 12/13/11 1031  CKTOTAL 41 --  CKMB 1.4 --  CKMBINDEX -- --  TROPONINI <0.30 <0.30   BNP:  Basename 12/13/11 2217  PROBNP 679.8*   D-Dimer: No results found for this basename: DDIMER:2 in the last 72 hours CBG:  Basename 12/15/11 0716 12/14/11 2106 12/14/11 1631  GLUCAP 145* 148* 160*   Hemoglobin A1C:  Basename 12/14/11 1056  HGBA1C 4.8   Fasting Lipid Panel: No results found for this basename: CHOL,HDL,LDLCALC,TRIG,CHOLHDL,LDLDIRECT in the last 72 hours Thyroid Function Tests:  Basename 12/13/11 1136  TSH 1.483  T4TOTAL --  FREET4 --  T3FREE --  THYROIDAB --   Anemia Panel:  Basename 12/13/11 1145  VITAMINB12 1590*  FOLATE 5.4  FERRITIN 1582*  TIBC NOT CALC  IRON 105  RETICCTPCT 11.5*   Coagulation:  Basename 12/15/11 0512 12/14/11 0523  LABPROT 19.6* 19.7*  INR 1.63* 1.64*   Urine Drug Screen: Drugs of Abuse     Component Value Date/Time   LABOPIA NONE DETECTED 12/13/2011 1102   COCAINSCRNUR NONE DETECTED 12/13/2011 1102   LABBENZ POSITIVE* 12/13/2011 1102   AMPHETMU  NONE DETECTED 12/13/2011 1102   THCU NONE DETECTED 12/13/2011 1102   LABBARB NONE DETECTED 12/13/2011 1102    Alcohol Level:  Basename 12/13/11 1031  ETH <11   Urinalysis:  Basename 12/13/11 1102  COLORURINE AMBER*  LABSPEC 1.010  PHURINE 6.0  GLUCOSEU NEGATIVE  HGBUR LARGE*  BILIRUBINUR MODERATE*  KETONESUR NEGATIVE  PROTEINUR 30*  UROBILINOGEN 4.0*  NITRITE POSITIVE*  LEUKOCYTESUR SMALL*   Misc. Labs:   Micro: Recent Results (from the past 240 hour(s))  CULTURE, BLOOD (ROUTINE X 2)     Status: Normal (Preliminary result)   Collection Time   12/13/11 10:32 AM      Component Value Range Status Comment   Specimen Description BLOOD RIGHT ANTECUBITAL   Final    Special Requests     Final    Value: BOTTLES  DRAWN AEROBIC AND ANAEROBIC AEB=6CC ANA=4CC   Culture NO GROWTH 1 DAY   Final    Report Status PENDING   Incomplete   CULTURE, BLOOD (ROUTINE X 2)     Status: Normal (Preliminary result)   Collection Time   12/13/11 10:33 AM      Component Value Range Status Comment   Specimen Description BLOOD LEFT HAND   Final    Special Requests     Final    Value: BOTTLES DRAWN AEROBIC AND ANAEROBIC AEB=8CC ANA=4CC   Culture NO GROWTH 1 DAY   Final    Report Status PENDING   Incomplete   URINE CULTURE     Status: Normal (Preliminary result)   Collection Time   12/13/11 11:02 AM      Component Value Range Status Comment   Specimen Description URINE, CATHETERIZED   Final    Special Requests NONE   Final    Culture  Setup Time [id number]   Final    Colony Count >=100,000 COLONIES/ML   Final    Culture GRAM NEGATIVE RODS   Final    Report Status PENDING   Incomplete   MRSA PCR SCREENING     Status: Abnormal   Collection Time   12/13/11  3:04 PM      Component Value Range Status Comment   MRSA by PCR POSITIVE (*) NEGATIVE  Final     Studies/Results: Ct Abdomen Pelvis Wo Contrast  12/14/2011  *RADIOLOGY REPORT*  Clinical Data: Back pain, swelling, bruising, and bleeding.  CT ABDOMEN AND PELVIS WITHOUT CONTRAST  Technique:  Multidetector CT imaging of the abdomen and pelvis was performed following the standard protocol without intravenous contrast.  Comparison: None.  Findings: Consolidation or atelectasis in both lung bases. Pneumonia may be present.  Respiratory motion artifact limits visualization.  Diffuse low attenuation change throughout the liver with enlargement consistent with fatty infiltration.  Spleen size is normal.  It is difficult to resolve, there appears to be fluid around the liver edge as well as around the spleen.  Fluid tracks along the right pericolic gutter into the pelvis.  Low Hounsfield unit measurements of the fluid suggesting ascites.  The no obvious hemorrhage.  The gallbladder  is somewhat hyperdense which may be due to sludge.  No gallbladder wall thickening.  Pancreas and adrenal glands are unremarkable.  No hydronephrosis or stone in either kidney.  Calcification of the abdominal aorta without aneurysm.  Prominent visceral adipose tissues.  No free air in the abdomen.  The stomach, small bowel, and colon are decompressed.  Pelvis:  Foley catheter decompresses the bladder.  Bladder wall thickening is not excluded.  No  significant pelvic lymphadenopathy. The appendix is not visualized.  Pelvic phleboliths.  No evidence of diverticulitis.  Fat in the inguinal canals.  Degenerative changes in the lumbar spine with anterior wedging of T12 and L1 vertebra.  There is associated sclerosis and hypertrophic changes suggesting that these are likely chronic.  No paraspinal soft tissue hematomas.  IMPRESSION: Consolidation or atelectasis in both lung bases.  Diffuse fatty infiltration of the liver.  Increased density in the gallbladder suggesting sludge.  Mild abdominal and pelvic ascites.  Compression fractures of T12 and L1 which appear chronic.  Foley catheter in the bladder.  Original Report Authenticated By: Marlon Pel, M.D.   Ct Head Wo Contrast  12/13/2011  *RADIOLOGY REPORT*  Clinical Data: Altered mental status.  Weakness, confusion.  CT HEAD WITHOUT CONTRAST  Technique:  Contiguous axial images were obtained from the base of the skull through the vertex without contrast.  Comparison: 08/17/2011  Findings: Mild generalized atrophy.  No change. No acute intracranial abnormality.  Specifically, no hemorrhage, hydrocephalus, mass lesion, acute infarction, or significant intracranial injury.  No acute calvarial abnormality. Visualized paranasal sinuses and mastoids clear.  Orbital soft tissues unremarkable.  IMPRESSION: No acute intracranial abnormality.  Original Report Authenticated By: Cyndie Chime, M.D.   Dg Chest Port 1 View  12/14/2011  *RADIOLOGY REPORT*  Clinical Data:  Right PICC line placement  PORTABLE CHEST - 1 VIEW  Comparison: Portable exam 1025 hours compared to 12/13/2011  Findings: Right arm PICC line, tip projecting over SVC near cavoatrial junction. Enlargement of cardiac silhouette. Tortuous aorta. Pulmonary vascular congestion. Bibasilar atelectasis greater on the right. Upper lungs clear.  IMPRESSION: Bibasilar atelectasis. Tip of right arm PICC line projects over SVC near cavoatrial junction.  Original Report Authenticated By: Lollie Marrow, M.D.   Dg Chest Portable 1 View  12/13/2011  *RADIOLOGY REPORT*  Clinical Data: Altered mental status, hypotension  PORTABLE CHEST - 1 VIEW  Comparison: Portable chest x-ray of 08/17/2011  Findings: The lungs are clear.  Minimal right basilar linear atelectasis is present.  Cardiomegaly is stable.  No bony abnormality is seen.  IMPRESSION: Stable cardiomegaly.  Mild right basilar linear atelectasis.  Original Report Authenticated By: Juline Patch, M.D.   Dg Chest Port 1v Same Day  12/13/2011  *RADIOLOGY REPORT*  Clinical Data: Shortness of breath, hypoxia.  PORTABLE CHEST - 1 VIEW SAME DAY  Comparison: 12/13/2011.  Findings: Trachea is midline.  Heart size stable.  Lungs are somewhat low in volume with atelectasis at the right lung base. Underlying scarring is seen as well.  Minimal left basilar atelectasis.  No definite pleural fluid.  IMPRESSION: Bibasilar atelectasis, right greater than left.  Original Report Authenticated By: Reyes Ivan, M.D.    Medications:  Scheduled:    . antiseptic oral rinse  15 mL Mouth Rinse q12n4p  . cefTRIAXone (ROCEPHIN)  IV  1 g Intravenous Q24H  . chlorhexidine  15 mL Mouth Rinse BID  . Chlorhexidine Gluconate Cloth  6 each Topical Q0600  . Fluticasone-Salmeterol  1 puff Inhalation Q12H  . folic acid  1 mg Intravenous Daily  . furosemide  20 mg Intravenous Once  . insulin aspart  0-5 Units Subcutaneous QHS  . insulin aspart  0-9 Units Subcutaneous TID WC  . lactulose   10 g Oral BID  . lactulose  300 mL Rectal Once  . levalbuterol  0.63 mg Nebulization Once  . levalbuterol  0.63 mg Nebulization Q6H  . LORazepam  0-4 mg  Intravenous Q6H   Followed by  . LORazepam  0-4 mg Intravenous Q12H  . moxifloxacin  400 mg Intravenous Q24H  . mupirocin ointment  1 application Nasal BID  . pantoprazole (PROTONIX) IV  40 mg Intravenous Q12H  . pneumococcal 23 valent vaccine  0.5 mL Intramuscular Tomorrow-1000  . sodium chloride  10-40 mL Intracatheter Q12H  . thiamine  100 mg Oral Daily   Or  . thiamine  100 mg Intravenous Daily   Continuous:    . dextrose 5 % and 0.45% NaCl 1,000 mL with potassium chloride 40 mEq infusion    . octreotide (SANDOSTATIN) infusion 50 mcg/hr (12/15/11 0600)  . DISCONTD: dextrose 5 % and 0.45 % NaCl with KCl 20 mEq/L 125 mL/hr at 12/15/11 0600   NUU:[id number], LORazepam, LORazepam, morphine injection, ondansetron (ZOFRAN) IV, ondansetron, sodium chloride  Assessment: Principal Problem:  *GI bleed Active Problems:  Smoker, current status unknown  Acute blood loss anemia  Alcohol abuse  Jaundice  Coagulopathy  Macrocytic anemia  Renal insufficiency  Hepatic encephalopathy  UTI (urinary tract infection)  Debility  Frequent falls  Hepatic steatosis  Compression fracture of L1 lumbar vertebra  Hypocalcemia  Acute renal failure  Hypernatremia  Hypotension  Hyperglycemia  Alcoholic hepatitis  Abdominal pain, diffuse  Thrombocytopenia     1. GI bleed with melena. Likely upper GI bleed. He is on octreotide and IV Protonix. Dr. Karilyn Cota is following. EGD pending.  Acute blood loss anemia. He has macrocytosis with an elevated vitamin B12 level and borderline low folate level. He is status post 2 units of packed red blood cells yesterday and a total of 5 units in all. His hemoglobin has increased above 8 g now. There was some bruising on his lower back noted. CT scan of his abdomen and pelvis did not reveal a  retroperitoneal bleed.  Thrombocytopenia. This may be dilutional, in the setting of alcoholism. It will be followed closely.  Hypotension, likely secondary to hypovolemia. Resolved with IV fluids and blood transfusions.  Diffuse abdominal pain. CT scan of his abdomen was nonacute. The patient did not corporative with ultrasound the abdomen yesterday. Will try again today.  Alcoholic liver disease with jaundice, alcoholic hepatitis. Also noted on CT was diffuse fatty infiltration of the liver. He also has gallbladder sludge and mild abdominal and pelvic ascites. Ultrasound of his abdomen is pending. He is on vitamin therapy and the Ativan alcohol withdrawal protocol.   Coagulopathy. He is status post 2 units of fresh frozen plasma and vitamin K.  Hepatic encephalopathy. We'll treat with lactulose accordingly. Status post lactulose enema yesterday.  Urinary tract infection. He is on Rocephin. Urine culture is growing out gram-negative rods.  Increasing leukocytosis. Resolving following the addition of Avelox. This could be secondary to pneumonia given the findings on the CT of his abdomen and pelvis. We'll get a dedicated chest x-ray for further evaluation.  Hypocalcemia secondary to hypoalbuminemia.  Hypernatremia, will assume secondary to hypovolemia although the patient has received aggressive IV fluids. He is on hypotonic IV fluids.  Hyperglycemia. No known prior history of diabetes. This is likely secondary to dextrose and IV fluids. His hemoglobin A1c is 4.8. We'll continue to treat with sliding scale NovoLog.  Acute renal failure, secondary to prerenal azotemia. In March, his creatinine was within normal limits. His renal function has improved and his creatinine is now within normal limits.   Plan: 1. Will check a chest x-ray and proBNP to evaluate pulmonary edema. 2. We'll order an  abdominal x-ray for evaluation of abdominal tenderness. Ultrasound of his abdomen is pending. 3.  Continue Q6 hour CBCs. 4. We'll check an ammonia level in the morning. 5. EGD pending per Dr. Karilyn Cota.   Total ICU time 45 minutes.   LOS: 2 days   Trudy Kory 12/15/2011, 8:51 AM

## 2011-12-15 NOTE — Progress Notes (Signed)
INITIAL ADULT NUTRITION ASSESSMENT Date: 12/15/2011   Time: 10:32 AM Reason for Assessment: Nutrition Risk Screen (wt loss)  ASSESSMENT: Male 66 y.o.  Dx: GI bleed   Past Medical History  Diagnosis Date  . COPD (chronic obstructive pulmonary disease)   . Hypertension   . Hepatic steatosis 12/14/2011  . Compression fracture of L1 lumbar vertebra 12/14/2011  . C2 cervical fracture     Scheduled Meds:   . antiseptic oral rinse  15 mL Mouth Rinse q12n4p  . cefTRIAXone (ROCEPHIN)  IV  1 g Intravenous Q24H  . chlorhexidine  15 mL Mouth Rinse BID  . Chlorhexidine Gluconate Cloth  6 each Topical Q0600  . Fluticasone-Salmeterol  1 puff Inhalation Q12H  . folic acid  1 mg Intravenous Daily  . furosemide  20 mg Intravenous Once  . glycopyrrolate      . glycopyrrolate  0.2 mg Intravenous Once  . insulin aspart  0-5 Units Subcutaneous QHS  . insulin aspart  0-9 Units Subcutaneous TID WC  . lactulose  10 g Oral BID  . lactulose  300 mL Rectal Once  . levalbuterol  0.63 mg Nebulization Once  . levalbuterol  0.63 mg Nebulization Q6H  . LORazepam  0-4 mg Intravenous Q6H   Followed by  . LORazepam  0-4 mg Intravenous Q12H  . midazolam      . moxifloxacin  400 mg Intravenous Q24H  . mupirocin ointment  1 application Nasal BID  . pantoprazole (PROTONIX) IV  40 mg Intravenous Q12H  . pneumococcal 23 valent vaccine  0.5 mL Intramuscular Tomorrow-1000  . sodium chloride  10-40 mL Intracatheter Q12H  . thiamine  100 mg Oral Daily   Or  . thiamine  100 mg Intravenous Daily   Continuous Infusions:   . dextrose 5 % and 0.45% NaCl 1,000 mL with potassium chloride 40 mEq infusion 70 mL/hr at 12/15/11 0927  . lactated ringers 50 mL/hr at 12/15/11 1027  . octreotide (SANDOSTATIN) infusion 50 mcg/hr (12/15/11 1000)  . DISCONTD: dextrose 5 % and 0.45 % NaCl with KCl 20 mEq/L 125 mL/hr at 12/15/11 0800   PRN Meds:.levalbuterol, LORazepam, LORazepam, midazolam, morphine injection, ondansetron  (ZOFRAN) IV, ondansetron, sodium chloride  Ht: 6\' 1"  (185.4 cm)  Wt: 245 lb 13 oz (111.5 kg)  Ideal Wt:  79.9 kg % Ideal Wt: 140%  Usual Wt: 229.11# - At admission 12/13/11 (Pt unable to provide wt PTA at this time)  Body mass index is 32.43 kg/(m^2). Obesity Class I  Food/Nutrition Related Hx:Pt is scheduled for EGD today and currently unable to provide hx. He has hx of wt loss >10# PTA and Braden=10 high risk for skin breakdown. He has Stage II buttocks. He is at risk for malnutrition given his hx of wt loss and alcohol abuse.   CMP     Component Value Date/Time   NA 146* 12/15/2011 0512   K 3.4* 12/15/2011 0512   CL 114* 12/15/2011 0512   CO2 23 12/15/2011 0512   GLUCOSE 164* 12/15/2011 0512   BUN 32* 12/15/2011 0512   CREATININE 1.22 12/15/2011 0512   CALCIUM 6.9* 12/15/2011 0512   PROT 5.9* 12/15/2011 0512   ALBUMIN 1.9* 12/15/2011 0512   AST 110* 12/15/2011 0512   ALT 35 12/15/2011 0512   ALKPHOS 95 12/15/2011 0512   BILITOT 4.3* 12/15/2011 0512   GFRNONAA 61* 12/15/2011 0512   GFRAA 71* 12/15/2011 0512    Intake/Output Summary (Last 24 hours) at 12/15/11 1040 Last data filed at  12/15/11 1000  Gross per 24 hour  Intake 4480.34 ml  Output   2000 ml  Net 2480.34 ml    Diet Order: NPO day-2  Supplements/Tube Feeding:none at this time  IVF:    dextrose 5 % and 0.45% NaCl 1,000 mL with potassium chloride 40 mEq infusion Last Rate: 70 mL/hr at 12/15/11 4540  lactated ringers Last Rate: 50 mL/hr at 12/15/11 1027  octreotide (SANDOSTATIN) infusion Last Rate: 50 mcg/hr (12/15/11 1000)  DISCONTD: dextrose 5 % and 0.45 % NaCl with KCl 20 mEq/L Last Rate: 125 mL/hr at 12/15/11 0800    Estimated Nutritional Needs:   Kcal:2442-2664 kcal per day Protein:111-133 grams per day Fluid:1 ml per kcal  NUTRITION DIAGNOSIS: -Excessive Alcohol Intake (NI-4.3)  RELATED TO: Alcoholic liver disease with jaundice, alcoholic hepatitis  AS EVIDENCE BY: pt hx alcohol  abuse  MONITORING/EVALUATION(Goals): -Monitor Diet advancement, tolerance, po intake, skin assessments, labs and wt trends -Pt will meet >75% of minimum est nutritional needs  EDUCATION NEEDS: -Education not appropriate at this time  INTERVENTION: -RD to follow for nutrition needs as diet is advanced  Dietitian [phone]  DOCUMENTATION CODES Per approved criteria  -Obesity Unspecified    Francene Boyers 12/15/2011, 10:32 AM

## 2011-12-15 NOTE — Op Note (Addendum)
EGD PROCEDURE REPORT  PATIENT:  Roberto [name]  MR#:  [id number] Birthdate:  02-04-1946, 66 y.o., male Endoscopist:  Dr. Malissa Hippo, MD Referred By:  Dr. Nani Skillern, M.D. Procedure Date: 12/15/2011  Procedure:   EGD  Indications:  Patient is 66 year old Caucasian male with multiple medical problems including alcoholic hepatitis, hepatic encephalopathy who also has anemia and GI bleed. He is undergoing diagnostic/therapeutic EGD.            Informed Consent:  The risks, benefits, alternatives & imponderables which include, but are not limited to, bleeding, infection, perforation, drug reaction and potential missed lesion have been reviewed.  The potential for biopsy, lesion removal, esophageal dilation, etc. have also been discussed.  Questions have been answered.  All parties agreeable.  Please see history & physical in medical record for more information. Informed consent was obtained from patient's son Jourdain Guay over the phone. I have talked with his other son [name].  Medications:  Cetacaine spray topically for oropharyngeal anesthesia Patient was given propofol. Please see anesthesia records for details.  Description of procedure:  The endoscope was introduced through the mouth and advanced to the second portion of the duodenum without difficulty or limitations. The mucosal surfaces were surveyed very carefully during advancement of the scope and upon withdrawal.  Findings:  Esophagus:  Mucosa of the proximal and middle segment was normal. In the distal esophagus there were 3 very short columns of grade 1 esophageal varices. Erythema and edema noted to GE junction. GEJ:  40 cm Stomach: Stomach was empty and distended very well with insufflation. Folds in the proximal stomach were normal. Examination of mucosa at body revealed mosaic pattern with erythematous areas. Antral mucosa was normal. Pyloric channel was patent. Angularis fundus and cardia were unremarkable. Duodenum:   Large bulbar ulcer at distal bulb without stigmata of bleed. This ulcer had maximal diameter of 2 cm. Toes bulbar mucosa was normal.  Therapeutic/Diagnostic Maneuvers Performed:  None  Complications:  None  Impression: Three short columns of grade 1 esophageal varices. Mild changes of reflux esophagitis limited to GE junction. Portal gastropathy. Large bulbar ulcer without stigmata of bleed but felt to be source of patient's melena.  Recommendations:  Full liquid diet. Continue IV PPI. H. pylori serology. No aspirin or other NSAIDs. Taper/discontinue octreotide.  Zubair Lofton U  12/15/2011  12:33 PM  CC: Dr. Kirk Ruths, MD, MD & Dr. Bonnetta Barry ref. provider found

## 2011-12-15 NOTE — Progress Notes (Signed)
Patient has not passed any more tarry stools. Patient was just given morphine for back pain and is sleepy but responds appropriately to questions. He denies abdominal pain. H&H is 8.7 and 25.8 platelet count 119K INR 1.63 Serum potassium 3.4, urine 32 and creatinine 1.22. Bilirubin 4.3. Assessment; No evidence of active GI bleed. Patient remains on Sandostatin infusion since there is concern for esophageal and/or gastric varices given underlying alcoholic liver disease. Discussed with Dr. Sherrie Mustache. Will proceed with esophagogastroduodenoscopy with propofol later today. Consent will be obtained from one of his sons.

## 2011-12-15 NOTE — Care Management Note (Signed)
    Page 1 of 1   12/21/2011     4:01:32 PM   CARE MANAGEMENT NOTE 12/21/2011  Patient:  DURREL, MCNEE   Account Number:  [id number]  Date Initiated:  12/15/2011  Documentation initiated by:  Sharrie Rothman  Subjective/Objective Assessment:   Pt admitted from home with GI bleed. Pt currently lives at the [facility] in [place]. Pt is sedated from procedure.     Action/Plan:   CM will speak with pt once more alert. Financial counselor notified of pt self pay status. Will monitor for CM needs.   Anticipated DC Date:  12/20/2011   Anticipated DC Plan:  HOME/SELF CARE  In-house referral  Financial Counselor  Clinical Social Worker      DC Planning Services  CM consult      Choice offered to / List presented to:             Status of service:  Completed, signed off Medicare Important Message given?   (If response is "NO", the following Medicare IM given date fields will be blank) Date Medicare IM given:   Date Additional Medicare IM given:    Discharge Disposition:  SKILLED NURSING FACILITY  Per UR Regulation:    If discussed at Long Length of Stay Meetings, dates discussed:   12/21/2011    Comments:  12/21/11 1200 Sera Hitsman Leanord Hawking RN BSN CM Pt DC to SNF for rehab. CSW arranged.  12/18/11 1100 Mindee Robledo RN BSN CM Spoke at [employer] with CSW. Trying to develop a disposition plan. Pt currentlly in DTs and not approachable.  12/15/11 1548 Arlyss Queen, RN BSN CM

## 2011-12-15 NOTE — Anesthesia Postprocedure Evaluation (Signed)
  Anesthesia Post-op Note  Patient: Roberto [name]  Procedure(s) Performed: Procedure(s) (LRB): ESOPHAGOGASTRODUODENOSCOPY (EGD) WITH PROPOFOL (N/A)  Patient Location: PACU  Anesthesia Type: MAC  Level of Consciousness: awake and alert   Airway and Oxygen Therapy: Patient Spontanous Breathing and Patient connected to face mask oxygen  Post-op Pain: none  Post-op Assessment: Post-op Vital signs reviewed, Patient's Cardiovascular Status Stable, Respiratory Function Stable, Patent Airway and No signs of Nausea or vomiting  Post-op Vital Signs: Reviewed and stable  Complications: No apparent anesthesia complications

## 2011-12-15 NOTE — Clinical Social Work Note (Signed)
Spoke with [facility] financial counselor, Haroldine Laws, re family request for help with Medicaid application on behalf of client.  [facility] DSS is mailing Medicaid application to [employer], brother of patient who is also his power of attorney.    [name] Clinical Services Director [phone])

## 2011-12-15 NOTE — Progress Notes (Addendum)
Patient remains confused, unable to follow commands.  Anxiety is high, rigid to turn to his side, Vitals stable this evening.  Family at bedside earlier. Education given for fathers anxiety and steps for his continued safety. Bed alarm in use as well as safety mitts applied to prevent dislodging of Picc line and oxygen tubing or foley catheter. Patient is tolerating them well.  Frequent reorientation.

## 2011-12-15 NOTE — Plan of Care (Signed)
Problem: Phase II Progression Outcomes Goal: Progress activity as tolerated unless otherwise ordered Outcome: Not Progressing Patient confused, unsteady, unable to follow commands, high fall risk Goal: Vital signs remain stable Outcome: Completed/Met Date Met:  12/15/11 BP stable tonight. Goal: Other Phase II Outcomes/Goals Outcome: Progressing Possible EGD in am after evaluation by GI.

## 2011-12-16 ENCOUNTER — Inpatient Hospital Stay (HOSPITAL_COMMUNITY): Payer: Medicaid Other

## 2011-12-16 ENCOUNTER — Encounter (HOSPITAL_COMMUNITY): Payer: Self-pay | Admitting: Internal Medicine

## 2011-12-16 DIAGNOSIS — K7460 Unspecified cirrhosis of liver: Secondary | ICD-10-CM

## 2011-12-16 DIAGNOSIS — D62 Acute posthemorrhagic anemia: Secondary | ICD-10-CM

## 2011-12-16 DIAGNOSIS — K7030 Alcoholic cirrhosis of liver without ascites: Secondary | ICD-10-CM

## 2011-12-16 DIAGNOSIS — K260 Acute duodenal ulcer with hemorrhage: Secondary | ICD-10-CM

## 2011-12-16 DIAGNOSIS — E782 Mixed hyperlipidemia: Secondary | ICD-10-CM

## 2011-12-16 HISTORY — DX: Unspecified cirrhosis of liver: K74.60

## 2011-12-16 LAB — CBC
HCT: 27.6 % — ABNORMAL LOW (ref 39.0–52.0)
HCT: 27.3 % — ABNORMAL LOW (ref 39.0–52.0)
Hemoglobin: 9.2 g/dL — ABNORMAL LOW (ref 13.0–17.0)
MCH: 35 pg — ABNORMAL HIGH (ref 26.0–34.0)
MCHC: 33.7 g/dL (ref 30.0–36.0)
MCHC: 33.7 g/dL (ref 30.0–36.0)
MCV: 103.8 fL — ABNORMAL HIGH (ref 78.0–100.0)
MCV: 104.2 fL — ABNORMAL HIGH (ref 78.0–100.0)
Platelets: 115 10*3/uL — ABNORMAL LOW (ref 150–400)
RDW: 28.2 % — ABNORMAL HIGH (ref 11.5–15.5)
RDW: 28.2 % — ABNORMAL HIGH (ref 11.5–15.5)
WBC: 10 10*3/uL (ref 4.0–10.5)

## 2011-12-16 LAB — COMPREHENSIVE METABOLIC PANEL
ALT: 36 U/L (ref 0–53)
Alkaline Phosphatase: 103 U/L (ref 39–117)
BUN: 21 mg/dL (ref 6–23)
CO2: 24 mEq/L (ref 19–32)
GFR calc Af Amer: 77 mL/min — ABNORMAL LOW (ref >90)
GFR calc non Af Amer: 67 mL/min — ABNORMAL LOW (ref >90)
Glucose, Bld: 145 mg/dL — ABNORMAL HIGH (ref 70–99)
Potassium: 3.5 mEq/L (ref 3.5–5.1)
Sodium: 150 mEq/L — ABNORMAL HIGH (ref 135–145)
Total Bilirubin: 4.4 mg/dL — ABNORMAL HIGH (ref 0.3–1.2)
Total Protein: 6.2 g/dL (ref 6.0–8.3)

## 2011-12-16 LAB — URINE CULTURE: Colony Count: >=100000

## 2011-12-16 LAB — GLUCOSE, CAPILLARY

## 2011-12-16 LAB — AMMONIA: Ammonia: 47 umol/L (ref 11–60)

## 2011-12-16 MED ORDER — POTASSIUM CHLORIDE 2 MEQ/ML IV SOLN
INTRAVENOUS | Status: DC
  Administered 2011-12-16 – 2011-12-17 (×3): via INTRAVENOUS
  Filled 2011-12-16 (×7): qty 1000

## 2011-12-16 MED ORDER — CLONIDINE HCL ER 0.1 MG PO TB12
0.1000 mg | ORAL_TABLET | Freq: Every day | ORAL | Status: DC
  Filled 2011-12-16 (×2): qty 1

## 2011-12-16 MED ORDER — POTASSIUM CHLORIDE CRYS ER 20 MEQ PO TBCR
EXTENDED_RELEASE_TABLET | ORAL | Status: AC
  Filled 2011-12-16: qty 1

## 2011-12-16 MED ORDER — PRO-STAT SUGAR FREE PO LIQD
ORAL | Status: AC
  Administered 2011-12-16: 30 mL via ORAL
  Filled 2011-12-16: qty 30

## 2011-12-16 MED ORDER — CIPROFLOXACIN IN D5W 400 MG/200ML IV SOLN
400.0000 mg | Freq: Two times a day (BID) | INTRAVENOUS | Status: DC
  Administered 2011-12-16 – 2011-12-17 (×2): 400 mg via INTRAVENOUS
  Filled 2011-12-16 (×5): qty 200

## 2011-12-16 MED ORDER — TRAZODONE HCL 50 MG PO TABS
25.0000 mg | ORAL_TABLET | Freq: Every day | ORAL | Status: AC
  Administered 2011-12-16: 25 mg via ORAL
  Filled 2011-12-16: qty 1

## 2011-12-16 MED ORDER — POTASSIUM CHLORIDE CRYS ER 20 MEQ PO TBCR
20.0000 meq | EXTENDED_RELEASE_TABLET | Freq: Two times a day (BID) | ORAL | Status: AC
  Administered 2011-12-16 (×2): 20 meq via ORAL
  Filled 2011-12-16: qty 1

## 2011-12-16 NOTE — Progress Notes (Addendum)
Subjective: Patient is a little more alert and oriented. He still becomes confused at times. He is sitting up in bed, being fed by the nurse tech.   Objective: Vital signs in last 24 hours: Filed Vitals:   12/16/11 0400 12/16/11 0500 12/16/11 0600 12/16/11 0820  BP: 123/77 135/103 128/61   Pulse:      Temp: 98.6 F (37 C)     TempSrc: Axillary     Resp: 15 20 23    Height:      Weight:  110.5 kg (243 lb 9.7 oz)    SpO2:    94%    Intake/Output Summary (Last 24 hours) at 12/16/11 0838 Last data filed at 12/16/11 0600  Gross per 24 hour  Intake 2523.5 ml  Output   1700 ml  Net  823.5 ml    Weight change: -1 kg (-2 lb 3.3 oz)  Physical exam: Lungs: Occasional crackles, no wheezes. Heart: S1, S2, with no audible murmurs rubs or gallops. Abdomen: Obese, positive bowel sounds, mildly to moderately distended and tympanic, mild diffuse tenderness.. GU: Foley catheter in place. Urine is dark yellow. Extremities: No pedal edema. Neuro/psychiatric: He is more alert than yesterday. His speech is slightly more clear. He follows short commands. No tremulousness.   Lab Results: Basic Metabolic Panel:  Basename 12/16/11 0423 12/15/11 0512  NA 150* 146*  K 3.5 3.4*  CL 117* 114*  CO2 24 23  GLUCOSE 145* 164*  BUN 21 32*  CREATININE 1.14 1.22  CALCIUM 7.1* 6.9*  MG -- --  PHOS -- --   Liver Function Tests:  [facility] 12/16/11 0423 12/15/11 0512  AST 108* 110*  ALT 36 35  ALKPHOS 103 95  BILITOT 4.4* 4.3*  PROT 6.2 5.9*  ALBUMIN 2.0* 1.9*   No results found for this basename: LIPASE:2,AMYLASE:2 in the last 72 hours  Basename 12/16/11 0423 12/13/11 1031  AMMONIA 47 145*   CBC:  Basename 12/16/11 0423 12/15/11 1400 12/13/11 1031  WBC 10.0 9.5 --  NEUTROABS -- -- 9.8*  HGB 9.3* 8.7* --  HCT 27.6* 25.8* --  MCV 103.8* 101.2* --  PLT 115* 120* --   Cardiac Enzymes:  Basename 12/13/11 2217 12/13/11 1031  CKTOTAL 41 --  CKMB 1.4 --  CKMBINDEX -- --  TROPONINI  <0.30 <0.30   BNP:  Basename 12/15/11 0512 12/13/11 2217  PROBNP 3592.0* 679.8*   D-Dimer: No results found for this basename: DDIMER:2 in the last 72 hours CBG:  Basename 12/16/11 0749 12/15/11 2120 12/15/11 1616 12/15/11 1352 12/15/11 0716 12/14/11 2106  GLUCAP 146* 137* 131* 112* 145* 148*   Hemoglobin A1C:  Basename 12/14/11 1056  HGBA1C 4.8   Fasting Lipid Panel: No results found for this basename: CHOL,HDL,LDLCALC,TRIG,CHOLHDL,LDLDIRECT in the last 72 hours Thyroid Function Tests:  Basename 12/13/11 1136  TSH 1.483  T4TOTAL --  FREET4 --  T3FREE --  THYROIDAB --   Anemia Panel:  Basename 12/13/11 1145  VITAMINB12 1590*  FOLATE 5.4  FERRITIN 1582*  TIBC NOT CALC  IRON 105  RETICCTPCT 11.5*   Coagulation:  Basename 12/15/11 0512 12/14/11 0523  LABPROT 19.6* 19.7*  INR 1.63* 1.64*   Urine Drug Screen: Drugs of Abuse     Component Value Date/Time   LABOPIA NONE DETECTED 12/13/2011 1102   COCAINSCRNUR NONE DETECTED 12/13/2011 1102   LABBENZ POSITIVE* 12/13/2011 1102   AMPHETMU NONE DETECTED 12/13/2011 1102   THCU NONE DETECTED 12/13/2011 1102   LABBARB NONE DETECTED 12/13/2011 1102  Alcohol Level:  Basename 12/13/11 1031  ETH <11   Urinalysis:  Basename 12/13/11 1102  COLORURINE AMBER*  LABSPEC 1.010  PHURINE 6.0  GLUCOSEU NEGATIVE  HGBUR LARGE*  BILIRUBINUR MODERATE*  KETONESUR NEGATIVE  PROTEINUR 30*  UROBILINOGEN 4.0*  NITRITE POSITIVE*  LEUKOCYTESUR SMALL*   Misc. Labs:   Micro: Recent Results (from the past 240 hour(s))  CULTURE, BLOOD (ROUTINE X 2)     Status: Normal (Preliminary result)   Collection Time   12/13/11 10:32 AM      Component Value Range Status Comment   Specimen Description BLOOD RIGHT ANTECUBITAL   Final    Special Requests     Final    Value: BOTTLES DRAWN AEROBIC AND ANAEROBIC AEB=6CC ANA=4CC   Culture NO GROWTH 1 DAY   Final    Report Status PENDING   Incomplete   CULTURE, BLOOD (ROUTINE X 2)     Status:  Normal (Preliminary result)   Collection Time   12/13/11 10:33 AM      Component Value Range Status Comment   Specimen Description BLOOD LEFT HAND   Final    Special Requests     Final    Value: BOTTLES DRAWN AEROBIC AND ANAEROBIC AEB=8CC ANA=4CC   Culture NO GROWTH 1 DAY   Final    Report Status PENDING   Incomplete   URINE CULTURE     Status: Normal   Collection Time   12/13/11 11:02 AM      Component Value Range Status Comment   Specimen Description URINE, CATHETERIZED   Final    Special Requests NONE   Final    Culture  Setup Time [id number]   Final    Colony Count >=100,000 COLONIES/ML   Final    Culture PSEUDOMONAS AERUGINOSA   Final    Report Status 12/16/2011 FINAL   Final    Organism ID, Bacteria PSEUDOMONAS AERUGINOSA   Final   MRSA PCR SCREENING     Status: Abnormal   Collection Time   12/13/11  3:04 PM      Component Value Range Status Comment   MRSA by PCR POSITIVE (*) NEGATIVE  Final     Studies/Results: US Abdomen Complete  12/15/2011  *RADIOLOGY REPORT*  Clinical Data:  Ascites.  GI bleed.  COMPLETE ABDOMINAL ULTRASOUND  Comparison:  12/15/2011.  Findings:  Gallbladder:  No sonographic Murphy's sign.  Suboptimal visualization of the gallbladder.  The patient was combative and the study is technically degraded.  Common bile duct:  Not visualized.  Liver:  Nodular liver, compatible with micronodular cirrhosis. Heterogeneous echotexture.  No focal mass lesions.  IVC:  Not visualized.  Pancreas:  Not visualized.  Spleen:  8.3 cm.  Normal echotexture.  Right Kidney:  Not seen due to bowel gas and patient noncompliance.  Left Kidney:  11.8 cm.  No hydronephrosis.  Abdominal aorta:  Not visualized.  IMPRESSION:  1.  Technically suboptimal study due to both patient combativeness and overlying bowel gas. 2.  Small volume of ascites. 3.  Echogenic liver with micronodular contour compatible with hepatic cirrhosis.  Original Report Authenticated By: Andreas Newport, M.D.   Dg Chest  Port 1 View  12/15/2011  *RADIOLOGY REPORT*  Clinical Data: 66 year old male with abnormal pulmonary auscultation.  Chronic obstructive pulmonary disease.  PORTABLE CHEST - 1 VIEW  Comparison: 12/14/2011 and earlier.  Findings: Portable upright AP view at 0915 hours.  Interval mildly improved lung bases and bibasilar ventilation.  Residual mild patchy opacity on the right.  No pneumothorax, pulmonary edema, pleural effusion or consolidation.  Stable cardiac size and mediastinal contours.  Right PICC line remains in place.  IMPRESSION: Interval improved bibasilar ventilation with mild residual patchy opacity.  Original Report Authenticated By: Harley Hallmark, M.D.   Dg Chest Port 1 View  12/14/2011  *RADIOLOGY REPORT*  Clinical Data: Right PICC line placement  PORTABLE CHEST - 1 VIEW  Comparison: Portable exam 1025 hours compared to 12/13/2011  Findings: Right arm PICC line, tip projecting over SVC near cavoatrial junction. Enlargement of cardiac silhouette. Tortuous aorta. Pulmonary vascular congestion. Bibasilar atelectasis greater on the right. Upper lungs clear.  IMPRESSION: Bibasilar atelectasis. Tip of right arm PICC line projects over SVC near cavoatrial junction.  Original Report Authenticated By: Lollie Marrow, M.D.   Dg Abd Portable 1v  12/15/2011  *RADIOLOGY REPORT*  Clinical Data: 66 year old male with abdominal pain.  PORTABLE ABDOMEN - 1 VIEW  Comparison: CT abdomen pelvis 12/14/2011.  Findings: Supine AP portable view 0914 hours. Nonobstructed bowel gas pattern. No acute osseous abnormality identified.  No definite pneumoperitoneum on the supine view.  IMPRESSION: Nonobstructed bowel gas pattern.  Original Report Authenticated By: Harley Hallmark, M.D.    Medications:  Scheduled:    . antiseptic oral rinse  15 mL Mouth Rinse q12n4p  . cefTRIAXone (ROCEPHIN)  IV  1 g Intravenous Q24H  . chlorhexidine  15 mL Mouth Rinse BID  . Chlorhexidine Gluconate Cloth  6 each Topical Q0600  .  feeding supplement  30 mL Oral TID WC  . fentaNYL      . Fluticasone-Salmeterol  1 puff Inhalation Q12H  . folic acid  1 mg Intravenous Daily  . glycopyrrolate      . glycopyrrolate  0.2 mg Intravenous Once  . insulin aspart  0-5 Units Subcutaneous QHS  . insulin aspart  0-9 Units Subcutaneous TID WC  . lactulose  10 g Oral BID  . levalbuterol  0.63 mg Nebulization Once  . levalbuterol  0.63 mg Nebulization Q6H  . LORazepam  0-4 mg Intravenous Q6H   Followed by  . LORazepam  0-4 mg Intravenous Q12H  . midazolam      . midazolam      . midazolam      . moxifloxacin  400 mg Intravenous Q24H  . mupirocin ointment  1 application Nasal BID  . pantoprazole (PROTONIX) IV  40 mg Intravenous Q12H  . propofol      . sodium chloride  10-40 mL Intracatheter Q12H  . thiamine  100 mg Oral Daily   Or  . thiamine  100 mg Intravenous Daily   Continuous:    . dextrose 5 % with kcl    . DISCONTD: dextrose 5 % and 0.45 % NaCl with KCl 20 mEq/L 125 mL/hr at 12/15/11 0800  . DISCONTD: dextrose 5 % and 0.45% NaCl 1,000 mL with potassium chloride 40 mEq infusion 70 mL/hr at 12/16/11 0532  . DISCONTD: lactated ringers 50 mL/hr at 12/15/11 1027  . DISCONTD: octreotide (SANDOSTATIN) infusion 50 mcg/hr (12/15/11 1000)  . DISCONTD: octreotide (SANDOSTATIN) infusion Stopped (12/15/11 1621)   ZOX:[id number], LORazepam, LORazepam, morphine injection, ondansetron (ZOFRAN) IV, ondansetron, sodium chloride, DISCONTD: acetaminophen, DISCONTD: fentaNYL, DISCONTD: midazolam, DISCONTD: ondansetron (ZOFRAN) IV, DISCONTD: simethicone susp in sterile water 1000 mL irrigation, DISCONTD: sterile water for irrigation  Assessment: Principal Problem:  *GI bleed Active Problems:  Smoker, current status unknown  Acute blood loss anemia  Alcohol abuse  Jaundice  Coagulopathy  Macrocytic anemia  Renal  insufficiency  Hepatic encephalopathy  UTI (urinary tract infection)  Debility  Frequent falls  Hepatic  steatosis  Compression fracture of L1 lumbar vertebra  Hypocalcemia  Acute renal failure  Hypernatremia  Hypotension  Hyperglycemia  Alcoholic hepatitis  Abdominal pain, diffuse  Thrombocytopenia  Duodenal ulcer with hemorrhage  Esophageal varices without bleeding  Cirrhosis     1. GI bleed with melena. Likely secondary to duodenal ulcer per EGD and Dr. Karilyn Cota..   Esophageal varices without bleeding.  Acute blood loss anemia. Hemoglobin stable over the past 24 hours. He has macrocytosis with an elevated vitamin B12 level and borderline low folate level. He is status post 2 units of packed red blood cells yesterday and a total of 5 units in all. His hemoglobin has increased above 8 g now. There was some bruising on his lower back noted. CT scan of his abdomen and pelvis did not reveal a retroperitoneal bleed.  Thrombocytopenia. This may be dilutional, in the setting of alcoholism and cirrhosis. It will be followed closely.  Hypotension, likely secondary to hypovolemia. Resolved with IV fluids and blood transfusions.  Diffuse abdominal pain. His abdomen is distended, likely from insufflation from the EGD yesterday. It will be followed. CT scan of his abdomen was nonacute. The patient did not corporative with ultrasound the abdomen yesterday. Will try again today.  Alcoholic liver disease with jaundice, alcoholic hepatitis/sonographic evidence of cirrhosis.. Also noted on CT was diffuse fatty infiltration of the liver. He also has gallbladder sludge and mild abdominal and pelvic ascites. He is on vitamin therapy and the Ativan alcohol withdrawal protocol.   Coagulopathy. He is status post 2 units of fresh frozen plasma and vitamin K.  Hepatic encephalopathy. We'll treat with lactulose accordingly. His ammonia level has normalized.  Urinary tract infection. Secondary to Pseudomonas. He is on Rocephin and Avelox.  Hypocalcemia secondary to hypoalbuminemia.  Hypernatremia, will  assume secondary to hypovolemia although the patient has received aggressive IV fluids. He is on hypotonic IV fluids, but will change to D5W.  Hyperglycemia. No known prior history of diabetes. This is likely secondary to dextrose and IV fluids. His hemoglobin A1c is 4.8. We'll continue to treat with sliding scale NovoLog.  Acute renal failure, secondary to prerenal azotemia. Resolved. Next  Pulmonary crackles. His chest x-ray reveals no significant edema or infiltrates. Treating with nebulizers. Of note, his pro BNP is elevated, but he does not appear to be volume overloaded.   Plan: 1. We'll change the frequency of the CBCs that every morning and every afternoon. 2. We'll change his IV fluids to D5W 3. Continue supportive treatment. 4. We'll change antibiotic to Cipro and discontinue Rocephin and Avelox given the results of the urine culture.   Total ICU time 45 minutes.   LOS: 3 days   Roberto [name] 12/16/2011, 8:38 AM

## 2011-12-16 NOTE — Progress Notes (Signed)
Subjective; Patient denies abdominal pain; according to nursing staff he ate more than half of this breakfast. They should able to swallow without any difficulty. Objective; BP 120/84  Pulse 92  Temp(Src) 98.5 F (36.9 C) (Oral)  Resp 19  Ht 6\' 1"  (1.854 m)  Wt 243 lb 9.7 oz (110.5 kg)  BMI 32.14 kg/m2  SpO2 94% Patient is drowsy but able to answer some of the questions he is tremulous but no asterixis noted. His abdomen is protuberant but soft and nontender. No LE edema noted. Lab data; WBC 10, hemoglobin 9.3, hematocrit 27.6, platelet 115K Serum ammonia 47 is normal Serum sodium 150, potassium 3.5, chloride 117, CO2 24, glucose 145, BUN 21, creatinine 1.14. Bilirubin 4.4. AP 103, AST 108, ALT 36, albumin 2.0, Urine culture positive for Pseudomonas aeruginosa. Blood cultures negative. H. pylori serology is pending Viral markers for hepatitis A, B. and C. are negative Assessment; #1 Upper GI bleed secondary to large duodenal ulcer. No evidence of active bleeding. H&H is coming up. Patient is on IV PPI every 12. Which switched to oral agent in a.m. #2. Alcoholic hepatitis. Transaminases are coming down. He may have underlying cirrhosis given that he had portal gastropathy and grade 1 esophageal varices. #3. Urinary tract infection. Defer antibiotic changes to Dr. Sherrie Mustache. #4. Hepatic encephalopathy. Serum ammonia is down. He is drowsy but that is felt to be secondary to lorazepam he is getting to prevent alcohol withdrawal. #5. Hypernatremia being addressed by Dr. Sherrie Mustache Recommendations; Unchanged pantoprazole to oral route in a.m. Advance diet. INR in a.m.

## 2011-12-17 ENCOUNTER — Encounter (HOSPITAL_COMMUNITY): Payer: Self-pay | Admitting: Internal Medicine

## 2011-12-17 ENCOUNTER — Inpatient Hospital Stay (HOSPITAL_COMMUNITY): Payer: Medicaid Other

## 2011-12-17 DIAGNOSIS — E782 Mixed hyperlipidemia: Secondary | ICD-10-CM

## 2011-12-17 DIAGNOSIS — K260 Acute duodenal ulcer with hemorrhage: Secondary | ICD-10-CM

## 2011-12-17 DIAGNOSIS — F1911 Other psychoactive substance abuse, in remission: Secondary | ICD-10-CM

## 2011-12-17 DIAGNOSIS — D62 Acute posthemorrhagic anemia: Secondary | ICD-10-CM

## 2011-12-17 DIAGNOSIS — D684 Acquired coagulation factor deficiency: Secondary | ICD-10-CM

## 2011-12-17 HISTORY — DX: Other psychoactive substance abuse, in remission: F19.11

## 2011-12-17 LAB — TYPE AND SCREEN
ABO/RH(D): A POS
Antibody Screen: NEGATIVE
Unit division: 0
Unit division: 0
Unit division: 0

## 2011-12-17 LAB — DIC (DISSEMINATED INTRAVASCULAR COAGULATION)PANEL
Platelets: 95 10*3/uL — ABNORMAL LOW (ref 150–400)
aPTT: 39 seconds — ABNORMAL HIGH (ref 24–37)

## 2011-12-17 LAB — PROTIME-INR: INR: 1.85 — ABNORMAL HIGH (ref 0.00–1.49)

## 2011-12-17 LAB — BASIC METABOLIC PANEL
BUN: 15 mg/dL (ref 6–23)
CO2: 23 mEq/L (ref 19–32)
Calcium: 6.7 mg/dL — ABNORMAL LOW (ref 8.4–10.5)
GFR calc non Af Amer: 75 mL/min — ABNORMAL LOW (ref >90)
Glucose, Bld: 146 mg/dL — ABNORMAL HIGH (ref 70–99)
Potassium: 3.9 mEq/L (ref 3.5–5.1)
Sodium: 144 mEq/L (ref 135–145)

## 2011-12-17 LAB — CBC
HCT: 25.7 % — ABNORMAL LOW (ref 39.0–52.0)
Hemoglobin: 8.3 g/dL — ABNORMAL LOW (ref 13.0–17.0)
MCH: 34.7 pg — ABNORMAL HIGH (ref 26.0–34.0)
MCHC: 32.3 g/dL (ref 30.0–36.0)
RBC: 2.39 MIL/uL — ABNORMAL LOW (ref 4.22–5.81)

## 2011-12-17 MED ORDER — PANTOPRAZOLE SODIUM 40 MG PO TBEC
40.0000 mg | DELAYED_RELEASE_TABLET | Freq: Two times a day (BID) | ORAL | Status: DC
  Administered 2011-12-17 – 2011-12-21 (×8): 40 mg via ORAL
  Filled 2011-12-17 (×10): qty 1

## 2011-12-17 MED ORDER — ALPRAZOLAM 0.5 MG PO TABS
0.5000 mg | ORAL_TABLET | Freq: Three times a day (TID) | ORAL | Status: DC
  Administered 2011-12-17 – 2011-12-21 (×13): 0.5 mg via ORAL
  Filled 2011-12-17 (×15): qty 1

## 2011-12-17 MED ORDER — CIPROFLOXACIN HCL 250 MG PO TABS
500.0000 mg | ORAL_TABLET | Freq: Two times a day (BID) | ORAL | Status: DC
  Administered 2011-12-17 – 2011-12-21 (×9): 500 mg via ORAL
  Filled 2011-12-17 (×6): qty 2
  Filled 2011-12-17 (×2): qty 1
  Filled 2011-12-17 (×3): qty 2

## 2011-12-17 MED ORDER — QUETIAPINE FUMARATE 25 MG PO TABS
12.5000 mg | ORAL_TABLET | Freq: Every day | ORAL | Status: DC
Start: 2011-12-17 — End: 2011-12-17
  Filled 2011-12-17: qty 0.5

## 2011-12-17 MED ORDER — FOLIC ACID 1 MG PO TABS
1.0000 mg | ORAL_TABLET | Freq: Every day | ORAL | Status: DC
  Administered 2011-12-17 – 2011-12-21 (×5): 1 mg via ORAL
  Filled 2011-12-17 (×5): qty 1

## 2011-12-17 MED ORDER — QUETIAPINE FUMARATE 25 MG PO TABS
12.5000 mg | ORAL_TABLET | Freq: Two times a day (BID) | ORAL | Status: DC
  Administered 2011-12-17 – 2011-12-18 (×4): 12.5 mg via ORAL
  Filled 2011-12-17 (×2): qty 0.5
  Filled 2011-12-17: qty 1
  Filled 2011-12-17 (×2): qty 0.5
  Filled 2011-12-17: qty 1
  Filled 2011-12-17 (×3): qty 0.5

## 2011-12-17 MED ORDER — CLONIDINE HCL 0.1 MG PO TABS
0.1000 mg | ORAL_TABLET | Freq: Every day | ORAL | Status: DC
  Administered 2011-12-17 – 2011-12-20 (×4): 0.1 mg via ORAL
  Filled 2011-12-17 (×5): qty 1

## 2011-12-17 MED ORDER — ENSURE COMPLETE PO LIQD
237.0000 mL | Freq: Two times a day (BID) | ORAL | Status: DC
  Administered 2011-12-17 – 2011-12-20 (×7): 237 mL via ORAL

## 2011-12-17 NOTE — Addendum Note (Signed)
Addendum  created 12/17/11 1434 by Franco Nones, CRNA   Modules edited:Notes Section

## 2011-12-17 NOTE — Progress Notes (Signed)
Subjective; Patient complains of right knee pain. He denies abdominal pain. He had been complaining of upper abdominal pain to nursing staff earlier today. He ate most of his breakfast. Last stool was not tarry. Objective; BP 124/80  Pulse 79  Temp(Src) 98.9 F (37.2 C) (Axillary)  Resp 24  Ht 6\' 1"  (1.854 m)  Wt 244 lb 11.4 oz (111 kg)  BMI 32.29 kg/m2  SpO2 98% Patient is alert today. He is in a recliner and is restless. Abdomen is protuberant with mild tenderness across upper abdomen. No LE edema noted. Lab data; H&H 8.3 and 25.7. Platelet count 95K Serum sodium 144, potassium 3.9, Chloride 114, CO2 23 glucose 146, BUN 15, creatinine 1.03, INR 1.85. DIC panel results noted  H. Pylori serology is pending. Assessment; Upper GI bleed inactive. H&H is down some secondary to hemodilution.  Hepatic encephalopathy improved. Alcoholic liver disease. Hepatic function should improve as inflammation subsides as long as he does not go back to drinking. Coagulopathy and thrombocytopenia possibly secondary to liver disease. Do not believe he has DIC. Hypernatremia and azotemia resolved. Intermittent agitation secondary to alcohol withdrawal. Recommendations; Change PPI to oral route. CBC in a.m. Consider physical therapy

## 2011-12-17 NOTE — Progress Notes (Signed)
Subjective: The patient is intermittently confused and). Overnight, he was apparently agitated which required more Ativan. This morning, he denies pain or difficulty breathing. He is still intermittently confused. He does not recognize his half brother this morning. He does recognize the dictating physician.   Objective: Vital signs in last 24 hours: Filed Vitals:   12/17/11 0300 12/17/11 0400 12/17/11 0500 12/17/11 0600  BP: 124/68 133/87 153/83 124/80  Pulse:      Temp:  98.9 F (37.2 C)    TempSrc:  Axillary    Resp: 18 20 23 24   Height:      Weight:  111 kg (244 lb 11.4 oz)    SpO2:        Intake/Output Summary (Last 24 hours) at 12/17/11 0752 Last data filed at 12/17/11 0600  Gross per 24 hour  Intake   3270 ml  Output    800 ml  Net   2470 ml    Weight change: 0.5 kg (1 lb 1.6 oz)  Physical exam: Lungs: Occasional crackles, no wheezes. Heart: S1, S2, with no audible murmurs rubs or gallops. Abdomen: Obese, positive bowel sounds, mildly  distended and less tympanic, mild diffuse tenderness.. GU: Foley catheter in place. Urine is dark yellow. Extremities: No pedal edema. Neuro/psychiatric: He is more alert, but intermittently confused. He actually repeats my  my name. He is oriented to himself and [facility]. At times, he asked questions that do not make much sense. Overall, however, his speech is clear. He follows short commands. No tremulousness.   Lab Results: Basic Metabolic Panel:  Basename 12/17/11 0500 12/16/11 0423  NA 144 150*  K 3.9 3.5  CL 114* 117*  CO2 23 24  GLUCOSE 146* 145*  BUN 15 21  CREATININE 1.03 1.14  CALCIUM 6.7* 7.1*  MG -- --  PHOS -- --   Liver Function Tests:  Basename 12/16/11 0423 12/15/11 0512  AST 108* 110*  ALT 36 35  ALKPHOS 103 95  BILITOT 4.4* 4.3*  PROT 6.2 5.9*  ALBUMIN 2.0* 1.9*   No results found for this basename: LIPASE:2,AMYLASE:2 in the last 72 hours  Basename 12/16/11 0423  AMMONIA 47    CBC:  Basename 12/17/11 0500 12/16/11 1715  WBC 8.2 9.8  NEUTROABS -- --  HGB 8.3* 9.2*  HCT 25.7* 27.3*  MCV 107.5* 104.2*  PLT 95*95* 110*   Cardiac Enzymes: No results found for this basename: CKTOTAL:3,CKMB:3,CKMBINDEX:3,TROPONINI:3 in the last 72 hours BNP:  Basename 12/15/11 0512  PROBNP 3592.0*   D-Dimer:  Basename 12/17/11 0500  DDIMER PENDING   CBG:  Basename 12/16/11 1139 12/16/11 0749 12/15/11 2120 12/15/11 1616 12/15/11 1352 12/15/11 0716  GLUCAP 115* 146* 137* 131* 112* 145*   Hemoglobin A1C:  Basename 12/14/11 1056  HGBA1C 4.8   Fasting Lipid Panel: No results found for this basename: CHOL,HDL,LDLCALC,TRIG,CHOLHDL,LDLDIRECT in the last 72 hours Thyroid Function Tests: No results found for this basename: TSH,T4TOTAL,FREET4,T3FREE,THYROIDAB in the last 72 hours Anemia Panel: No results found for this basename: VITAMINB12,FOLATE,FERRITIN,TIBC,IRON,RETICCTPCT in the last 72 hours Coagulation:  Basename 12/17/11 0500 12/15/11 0512  LABPROT 21.7*21.7* 19.6*  INR 1.85*1.85* 1.63*   Urine Drug Screen: Drugs of Abuse     Component Value Date/Time   LABOPIA NONE DETECTED 12/13/2011 1102   COCAINSCRNUR NONE DETECTED 12/13/2011 1102   LABBENZ POSITIVE* 12/13/2011 1102   AMPHETMU NONE DETECTED 12/13/2011 1102   THCU NONE DETECTED 12/13/2011 1102   LABBARB NONE DETECTED 12/13/2011 1102    Alcohol Level:  No results found for this basename: ETH:2 in the last 72 hours Urinalysis: No results found for this basename: COLORURINE:2,APPERANCEUR:2,LABSPEC:2,PHURINE:2,GLUCOSEU:2,HGBUR:2,BILIRUBINUR:2,KETONESUR:2,PROTEINUR:2,UROBILINOGEN:2,NITRITE:2,LEUKOCYTESUR:2 in the last 72 hours Misc. Labs:   Micro: Recent Results (from the past 240 hour(s))  CULTURE, BLOOD (ROUTINE X 2)     Status: Normal (Preliminary result)   Collection Time   12/13/11 10:32 AM      Component Value Range Status Comment   Specimen Description BLOOD RIGHT ANTECUBITAL   Final    Special  Requests     Final    Value: BOTTLES DRAWN AEROBIC AND ANAEROBIC AEB=6CC ANA=4CC   Culture NO GROWTH 3 DAYS   Final    Report Status PENDING   Incomplete   CULTURE, BLOOD (ROUTINE X 2)     Status: Normal (Preliminary result)   Collection Time   12/13/11 10:33 AM      Component Value Range Status Comment   Specimen Description BLOOD LEFT HAND   Final    Special Requests     Final    Value: BOTTLES DRAWN AEROBIC AND ANAEROBIC AEB=8CC ANA=4CC   Culture NO GROWTH 3 DAYS   Final    Report Status PENDING   Incomplete   URINE CULTURE     Status: Normal   Collection Time   12/13/11 11:02 AM      Component Value Range Status Comment   Specimen Description URINE, CATHETERIZED   Final    Special Requests NONE   Final    Culture  Setup Time [id number]   Final    Colony Count >=100,000 COLONIES/ML   Final    Culture PSEUDOMONAS AERUGINOSA   Final    Report Status 12/16/2011 FINAL   Final    Organism ID, Bacteria PSEUDOMONAS AERUGINOSA   Final   MRSA PCR SCREENING     Status: Abnormal   Collection Time   12/13/11  3:04 PM      Component Value Range Status Comment   MRSA by PCR POSITIVE (*) NEGATIVE  Final     Studies/Results: US Abdomen Complete  12/15/2011  *RADIOLOGY REPORT*  Clinical Data:  Ascites.  GI bleed.  COMPLETE ABDOMINAL ULTRASOUND  Comparison:  12/15/2011.  Findings:  Gallbladder:  No sonographic Murphy's sign.  Suboptimal visualization of the gallbladder.  The patient was combative and the study is technically degraded.  Common bile duct:  Not visualized.  Liver:  Nodular liver, compatible with micronodular cirrhosis. Heterogeneous echotexture.  No focal mass lesions.  IVC:  Not visualized.  Pancreas:  Not visualized.  Spleen:  8.3 cm.  Normal echotexture.  Right Kidney:  Not seen due to bowel gas and patient noncompliance.  Left Kidney:  11.8 cm.  No hydronephrosis.  Abdominal aorta:  Not visualized.  IMPRESSION:  1.  Technically suboptimal study due to both patient combativeness and  overlying bowel gas. 2.  Small volume of ascites. 3.  Echogenic liver with micronodular contour compatible with hepatic cirrhosis.  Original Report Authenticated By: Andreas Newport, M.D.   Dg Chest Port 1 View  12/15/2011  *RADIOLOGY REPORT*  Clinical Data: 66 year old male with abnormal pulmonary auscultation.  Chronic obstructive pulmonary disease.  PORTABLE CHEST - 1 VIEW  Comparison: 12/14/2011 and earlier.  Findings: Portable upright AP view at 0915 hours.  Interval mildly improved lung bases and bibasilar ventilation.  Residual mild patchy opacity on the right.  No pneumothorax, pulmonary edema, pleural effusion or consolidation.  Stable cardiac size and mediastinal contours.  Right PICC line remains in place.  IMPRESSION: Interval  improved bibasilar ventilation with mild residual patchy opacity.  Original Report Authenticated By: Harley Hallmark, M.D.   Dg Abd Portable 1v  12/15/2011  *RADIOLOGY REPORT*  Clinical Data: 66 year old male with abdominal pain.  PORTABLE ABDOMEN - 1 VIEW  Comparison: CT abdomen pelvis 12/14/2011.  Findings: Supine AP portable view 0914 hours. Nonobstructed bowel gas pattern. No acute osseous abnormality identified.  No definite pneumoperitoneum on the supine view.  IMPRESSION: Nonobstructed bowel gas pattern.  Original Report Authenticated By: Harley Hallmark, M.D.    Medications:  Scheduled:    . antiseptic oral rinse  15 mL Mouth Rinse q12n4p  . chlorhexidine  15 mL Mouth Rinse BID  . Chlorhexidine Gluconate Cloth  6 each Topical Q0600  . ciprofloxacin  400 mg Intravenous Q12H  . cloNIDine HCl  0.1 mg Oral QHS  . feeding supplement  30 mL Oral TID WC  . Fluticasone-Salmeterol  1 puff Inhalation Q12H  . folic acid  1 mg Intravenous Daily  . insulin aspart  0-5 Units Subcutaneous QHS  . insulin aspart  0-9 Units Subcutaneous TID WC  . lactulose  10 g Oral BID  . levalbuterol  0.63 mg Nebulization Once  . levalbuterol  0.63 mg Nebulization Q6H  . LORazepam   0-4 mg Intravenous Q12H  . mupirocin ointment  1 application Nasal BID  . pantoprazole (PROTONIX) IV  40 mg Intravenous Q12H  . potassium chloride SA      . potassium chloride  20 mEq Oral BID  . sodium chloride  10-40 mL Intracatheter Q12H  . thiamine  100 mg Oral Daily   Or  . thiamine  100 mg Intravenous Daily  . traZODone  25 mg Oral QHS  . DISCONTD: cefTRIAXone (ROCEPHIN)  IV  1 g Intravenous Q24H  . DISCONTD: moxifloxacin  400 mg Intravenous Q24H   Continuous:    . dextrose 5 % with kcl 90 mL/hr at 12/17/11 0600   ZOX:[id number], LORazepam, LORazepam, morphine injection, ondansetron (ZOFRAN) IV, ondansetron, sodium chloride  Assessment: Principal Problem:  *GI bleed Active Problems:  Smoker, current status unknown  Acute blood loss anemia  Alcohol abuse  Jaundice  Coagulopathy  Macrocytic anemia  Renal insufficiency  Hepatic encephalopathy  UTI (urinary tract infection)  Debility  Frequent falls  Hepatic steatosis  Compression fracture of L1 lumbar vertebra  Hypocalcemia  Acute renal failure  Hypernatremia  Hypotension  Hyperglycemia  Alcoholic hepatitis  Abdominal pain, diffuse  Thrombocytopenia  Duodenal ulcer with hemorrhage  Esophageal varices without bleeding  Cirrhosis  Hx of substance abuse     1. GI bleed with melena, likely secondary to duodenal ulcer per EGD and Dr. Karilyn Cota. Status post octreotide for several days.   Esophageal varices without bleeding.  Acute blood loss anemia. Hemoglobin has trended down since yesterday. This could be in part dilutional from IV fluids.. He has macrocytosis with an elevated vitamin B12 level and borderline low folate level. He is status post 5 units of packed red blood cell transfusion in all. There is some bruising on his lower back noted. CT scan of his abdomen and pelvis did not reveal a retroperitoneal bleed.  Thrombocytopenia. This may be dilutional, in the setting of alcoholism and cirrhosis. Will get  a DIC panel for further evaluation.  Hypotension, likely secondary to hypovolemia. Resolved with IV fluids and blood transfusions.  Diffuse abdominal pain. His abdomen is distended, likely from insufflation from the EGD yesterday. KUB this morning pending. CT scan of his abdomen was  nonacute. The patient did not corporative with ultrasound the abdomen yesterday. Will try again today.  Alcoholic liver disease with jaundice, alcoholic hepatitis/sonographic evidence of cirrhosis.. Also noted on CT was diffuse fatty infiltration of the liver. He also has gallbladder sludge and mild abdominal and pelvic ascites. He is on vitamin therapy and the Ativan alcohol withdrawal protocol.   Coagulopathy. He is status post 2 units of fresh frozen plasma and vitamin K.  Hepatic encephalopathy, with delirium. The Ativan being given may be contributing to his delirium. His ammonia level has normalized on lactulose.  Urinary tract infection. Secondary to Pseudomonas. Rocephin Avelox discontinued them favor of Cipro which was started 12/16/2011.  Hypocalcemia secondary to hypoalbuminemia. His blood calcium corrects to 8.3.  Hypernatremia. Resolved with hypotonic IV fluids.  Hyperglycemia. No known prior history of diabetes. This is likely secondary to dextrose and IV fluids. His hemoglobin A1c is 4.8. We'll continue to treat with sliding scale NovoLog.  Acute renal failure, secondary to prerenal azotemia. Resolved.  Pulmonary crackles. His chest x-ray reveals no significant edema or infiltrates. Treating with nebulizers. Of note, his pro BNP is elevated, but he does not appear to be volume overloaded.  Per his brother Doreatha Martin and his son Thayer Ohm, the patient has a long history of prescription drug abuse. Up until a couple months ago, he was taking 20-25 Percocet daily. He had been on methadone in the past. He takes Xanax as prescribed but apparently abuses them by taking too many at a time. Over the past few days,  prior to hospital admission, he had been drinking very little alcohol, but he continued to take an average of 5-10 Xanax daily.   Plan:  1. Will change discontinued Ativan protocol and restart small doses of Xanax. We'll give clonidine at bedtime to help with delirium. 2. We'll add Seroquel twice a day. 3. Out of bed to the chair as tolerated. 4. We'll check a DIC panel. 5. PT consult tomorrow as tolerated. 6. We'll check the results of the KUB pending. We'll add Ensure. 7. We'll check acute hepatitis panel. 8. Change Cipro to by mouth.     LOS: 4 days   Marita Burnsed 12/17/2011, 7:52 AM

## 2011-12-17 NOTE — Anesthesia Postprocedure Evaluation (Signed)
Anesthesia Post Note  Patient: Roberto [name]  Procedure(s) Performed: Procedure(s) (LRB): ESOPHAGOGASTRODUODENOSCOPY (EGD) WITH PROPOFOL (N/A)  Anesthesia type: MAC  Patient location: ICU  Post pain: Pain level controlled  Post assessment: Post-op Vital signs reviewed, Patient's Cardiovascular Status Stable, Respiratory Function Stable, Patent Airway, No signs of Nausea or vomiting and Pain level controlled  Last Vitals:  Filed Vitals:   12/17/11 1300  BP: 97/64  Pulse:   Temp:   Resp: 16    Post vital signs: Reviewed and stable  Level of consciousness: awake and alert   Complications: No apparent anesthesia complications

## 2011-12-17 NOTE — Progress Notes (Signed)
The patient is receiving Folic Acid and Thiamine by the intravenous route.  Based on criteria approved by the Pharmacy and Therapeutics Committee and the Medical Executive Committee, the medication is being converted to the equivalent oral dose form.  These criteria include: -No Active GI bleeding -Able to tolerate diet of full liquids (or better) or tube feeding -Able to tolerate other medications by the oral or enteral route  If you have any questions about this conversion, please contact the Pharmacy Department (ext [phone]).  Thank you.  Mosetta Pigeon [place], [facility] 12/17/2011 10:07 AM

## 2011-12-18 ENCOUNTER — Encounter (HOSPITAL_COMMUNITY): Payer: Self-pay | Admitting: Internal Medicine

## 2011-12-18 DIAGNOSIS — D62 Acute posthemorrhagic anemia: Secondary | ICD-10-CM

## 2011-12-18 DIAGNOSIS — F1027 Alcohol dependence with alcohol-induced persisting dementia: Secondary | ICD-10-CM

## 2011-12-18 DIAGNOSIS — E782 Mixed hyperlipidemia: Secondary | ICD-10-CM

## 2011-12-18 DIAGNOSIS — K7030 Alcoholic cirrhosis of liver without ascites: Secondary | ICD-10-CM

## 2011-12-18 DIAGNOSIS — K260 Acute duodenal ulcer with hemorrhage: Secondary | ICD-10-CM

## 2011-12-18 DIAGNOSIS — K560 Paralytic ileus: Secondary | ICD-10-CM | POA: Diagnosis not present

## 2011-12-18 HISTORY — DX: Alcohol dependence with alcohol-induced persisting dementia: F10.27

## 2011-12-18 LAB — GLUCOSE, CAPILLARY: Glucose-Capillary: 93 mg/dL (ref 70–99)

## 2011-12-18 LAB — CBC
HCT: 26.2 % — ABNORMAL LOW (ref 39.0–52.0)
Hemoglobin: 8.7 g/dL — ABNORMAL LOW (ref 13.0–17.0)
MCHC: 33.2 g/dL (ref 30.0–36.0)
RBC: 2.49 MIL/uL — ABNORMAL LOW (ref 4.22–5.81)
WBC: 8.5 10*3/uL (ref 4.0–10.5)

## 2011-12-18 LAB — HEPATITIS PANEL, ACUTE
Hep A IgM: NEGATIVE
Hep B C IgM: NEGATIVE
Hepatitis B Surface Ag: NEGATIVE

## 2011-12-18 LAB — BASIC METABOLIC PANEL
BUN: 12 mg/dL (ref 6–23)
CO2: 22 mEq/L (ref 19–32)
Chloride: 114 mEq/L — ABNORMAL HIGH (ref 96–112)
GFR calc non Af Amer: >90 mL/min (ref >90)
Glucose, Bld: 94 mg/dL (ref 70–99)
Potassium: 3.9 mEq/L (ref 3.5–5.1)
Sodium: 142 mEq/L (ref 135–145)

## 2011-12-18 LAB — CULTURE, BLOOD (ROUTINE X 2)
Culture: NO GROWTH
Culture: NO GROWTH

## 2011-12-18 MED ORDER — SODIUM CHLORIDE 0.9 % IJ SOLN
INTRAMUSCULAR | Status: AC
  Administered 2011-12-18: 10:00:00
  Filled 2011-12-18: qty 6

## 2011-12-18 MED ORDER — HALOPERIDOL LACTATE 5 MG/ML IJ SOLN
3.0000 mg | Freq: Four times a day (QID) | INTRAMUSCULAR | Status: DC | PRN
  Administered 2011-12-18 – 2011-12-19 (×3): 3 mg via INTRAVENOUS
  Filled 2011-12-18 (×4): qty 1

## 2011-12-18 NOTE — Progress Notes (Signed)
Pt is very confused and restless. Pulled out iv and attempted to climb oob. Pt assisted up to recliner. Iv restarted in rt anterior hand. Morphine 2mg  iv given

## 2011-12-18 NOTE — Progress Notes (Signed)
Pt being transferred to room 316. Transfer report called to lisa covington [email or web address] on 300. Pt slightly less agitated since receiving iv morphine.remains up in recliner. recieivng RN informed of pt's confusion and restlessness. Pt will be going to room w/ safety camera. RN on 300 will decide if pt needs a Recruitment consultant. Iv patent. Foley cath patent. Respirations even and unlabored. No observered gi bleeding.

## 2011-12-18 NOTE — Progress Notes (Signed)
Subjective: Apparently, the patient slept better overnight, following the start of Seroquel. He has no complaints of pain or shortness of breath.   Objective: Vital signs in last 24 hours: Filed Vitals:   12/18/11 0500 12/18/11 0600 12/18/11 0700 12/18/11 0721  BP: 137/99 134/83 121/78   Pulse:      Temp:      TempSrc:      Resp:  16 17   Height:      Weight: 112 kg (246 lb 14.6 oz)     SpO2:    94%    Intake/Output Summary (Last 24 hours) at 12/18/11 0814 Last data filed at 12/18/11 0600  Gross per 24 hour  Intake   2400 ml  Output   1550 ml  Net    850 ml    Weight change: 1 kg (2 lb 3.3 oz)  Physical exam: Lungs: Occasional crackles, no wheezes. Heart: S1, S2, with no audible murmurs rubs or gallops. Abdomen: Obese, positive bowel sounds, mildly  distended and less tympanic, mild diffuse tenderness.. GU: Foley catheter in place. Urine is dark yellow. Extremities: No pedal edema. Neuro/psychiatric: He is more alert, but intermittently confused. He remembers the dictating physician from yesterday. He is oriented to himself and [facility]. At times, he asked questions that do not make much sense. Overall, however, his speech is clearer. He follows short commands. Minimal tremulousness.   Lab Results: Basic Metabolic Panel:  Basename 12/18/11 0541 12/17/11 0500  NA 142 144  K 3.9 3.9  CL 114* 114*  CO2 22 23  GLUCOSE 94 146*  BUN 12 15  CREATININE 0.84 1.03  CALCIUM 6.7* 6.7*  MG -- --  PHOS -- --   Liver Function Tests:  Basename 12/16/11 0423  AST 108*  ALT 36  ALKPHOS 103  BILITOT 4.4*  PROT 6.2  ALBUMIN 2.0*   No results found for this basename: LIPASE:2,AMYLASE:2 in the last 72 hours  Basename 12/16/11 0423  AMMONIA 47   CBC:  Basename 12/18/11 0541 12/17/11 0500  WBC 8.5 8.2  NEUTROABS -- --  HGB 8.7* 8.3*  HCT 26.2* 25.7*  MCV 105.2* 107.5*  PLT 90* 95*95*   Cardiac Enzymes: No results found for this basename:  CKTOTAL:3,CKMB:3,CKMBINDEX:3,TROPONINI:3 in the last 72 hours BNP: No results found for this basename: PROBNP:3 in the last 72 hours D-Dimer:  Basename 12/17/11 0500  DDIMER 6.55*   CBG:  Basename 12/16/11 1139 12/16/11 0749 12/15/11 2120 12/15/11 1616 12/15/11 1352  GLUCAP 115* 146* 137* 131* 112*   Hemoglobin A1C: No results found for this basename: HGBA1C in the last 72 hours Fasting Lipid Panel: No results found for this basename: CHOL,HDL,LDLCALC,TRIG,CHOLHDL,LDLDIRECT in the last 72 hours Thyroid Function Tests: No results found for this basename: TSH,T4TOTAL,FREET4,T3FREE,THYROIDAB in the last 72 hours Anemia Panel: No results found for this basename: VITAMINB12,FOLATE,FERRITIN,TIBC,IRON,RETICCTPCT in the last 72 hours Coagulation:  Basename 12/17/11 0500  LABPROT 21.7*21.7*  INR 1.85*1.85*   Urine Drug Screen: Drugs of Abuse     Component Value Date/Time   LABOPIA NONE DETECTED 12/13/2011 1102   COCAINSCRNUR NONE DETECTED 12/13/2011 1102   LABBENZ POSITIVE* 12/13/2011 1102   AMPHETMU NONE DETECTED 12/13/2011 1102   THCU NONE DETECTED 12/13/2011 1102   LABBARB NONE DETECTED 12/13/2011 1102    Alcohol Level: No results found for this basename: ETH:2 in the last 72 hours Urinalysis: No results found for this basename: COLORURINE:2,APPERANCEUR:2,LABSPEC:2,PHURINE:2,GLUCOSEU:2,HGBUR:2,BILIRUBINUR:2,KETONESUR:2,PROTEINUR:2,UROBILINOGEN:2,NITRITE:2,LEUKOCYTESUR:2 in the last 72 hours Misc. Labs:   Micro: Recent Results (from the  past 240 hour(s))  CULTURE, BLOOD (ROUTINE X 2)     Status: Normal (Preliminary result)   Collection Time   12/13/11 10:32 AM      Component Value Range Status Comment   Specimen Description BLOOD RIGHT ANTECUBITAL   Final    Special Requests     Final    Value: BOTTLES DRAWN AEROBIC AND ANAEROBIC AEB=6CC ANA=4CC   Culture NO GROWTH 4 DAYS   Final    Report Status PENDING   Incomplete   CULTURE, BLOOD (ROUTINE X 2)     Status: Normal  (Preliminary result)   Collection Time   12/13/11 10:33 AM      Component Value Range Status Comment   Specimen Description BLOOD LEFT HAND   Final    Special Requests     Final    Value: BOTTLES DRAWN AEROBIC AND ANAEROBIC AEB=8CC ANA=4CC   Culture NO GROWTH 4 DAYS   Final    Report Status PENDING   Incomplete   URINE CULTURE     Status: Normal   Collection Time   12/13/11 11:02 AM      Component Value Range Status Comment   Specimen Description URINE, CATHETERIZED   Final    Special Requests NONE   Final    Culture  Setup Time [id number]   Final    Colony Count >=100,000 COLONIES/ML   Final    Culture PSEUDOMONAS AERUGINOSA   Final    Report Status 12/16/2011 FINAL   Final    Organism ID, Bacteria PSEUDOMONAS AERUGINOSA   Final   MRSA PCR SCREENING     Status: Abnormal   Collection Time   12/13/11  3:04 PM      Component Value Range Status Comment   MRSA by PCR POSITIVE (*) NEGATIVE  Final     Studies/Results: Dg Abd 1 View  12/17/2011  *RADIOLOGY REPORT*  Clinical Data: Distended abdomen  ABDOMEN - 1 VIEW  Comparison: 12/15/2011  Findings: Gas filled right colon and transverse colon are similar to the prior study.  Small bowel is not dilated.  The study is limited by large patient size as well as suboptimal positioning the patient.  The upper abdomen and left abdomen are not included in the study.  IMPRESSION: Colonic dilatation, similar to the prior study.  Most likely this is ileus.  Original Report Authenticated By: Camelia Phenes, M.D.    Medications:  Scheduled:    . ALPRAZolam  0.5 mg Oral TID  . antiseptic oral rinse  15 mL Mouth Rinse q12n4p  . chlorhexidine  15 mL Mouth Rinse BID  . Chlorhexidine Gluconate Cloth  6 each Topical Q0600  . ciprofloxacin  500 mg Oral BID  . cloNIDine  0.1 mg Oral QHS  . feeding supplement  237 mL Oral BID BM  . feeding supplement  30 mL Oral TID WC  . Fluticasone-Salmeterol  1 puff Inhalation Q12H  . folic acid  1 mg Oral Daily  .  insulin aspart  0-5 Units Subcutaneous QHS  . insulin aspart  0-9 Units Subcutaneous TID WC  . lactulose  10 g Oral BID  . levalbuterol  0.63 mg Nebulization Once  . levalbuterol  0.63 mg Nebulization Q6H  . mupirocin ointment  1 application Nasal BID  . pantoprazole  40 mg Oral BID AC  . QUEtiapine  12.5 mg Oral BID  . sodium chloride  10-40 mL Intracatheter Q12H  . thiamine  100 mg Oral Daily  . DISCONTD:  folic acid  1 mg Intravenous Daily  . DISCONTD: pantoprazole (PROTONIX) IV  40 mg Intravenous Q12H  . DISCONTD: QUEtiapine  12.5 mg Oral QHS  . DISCONTD: thiamine  100 mg Intravenous Daily   Continuous:    . dextrose 5 % with kcl 65 mL/hr at 12/17/11 1700   ZOX:[id number], morphine injection, ondansetron (ZOFRAN) IV, ondansetron, sodium chloride  Assessment: Principal Problem:  *GI bleed Active Problems:  Smoker, current status unknown  Acute blood loss anemia  Alcohol abuse  Jaundice  Coagulopathy  Macrocytic anemia  Renal insufficiency  Hepatic encephalopathy  UTI (urinary tract infection)  Debility  Frequent falls  Hepatic steatosis  Compression fracture of L1 lumbar vertebra  Hypocalcemia  Acute renal failure  Hypernatremia  Hypotension  Hyperglycemia  Alcoholic hepatitis  Abdominal pain, diffuse  Thrombocytopenia  Duodenal ulcer with hemorrhage  Esophageal varices without bleeding  Cirrhosis  Hx of substance abuse  Adynamic ileus  Alcohol dependence with alcohol-induced persisting dementia     1. GI bleed with melena,  secondary to duodenal ulcer per EGD and Dr. Karilyn Cota. Status post octreotide for several days.   Esophageal varices without bleeding.  Acute blood loss anemia. Hemoglobin is holding in the 8-8.7 range. This could be in part dilutional from IV fluids.. He has macrocytosis with an elevated vitamin B12 level and borderline low folate level. He is status post 5 units of packed red blood cell transfusion in all. There is some bruising on  his lower back noted. CT scan of his abdomen and pelvis did not reveal a retroperitoneal bleed.  Thrombocytopenia. This may be dilutional, in the setting of alcoholism and cirrhosis, but DIC panel reveals schistocytes. Will consult hematology.  Hypotension, likely secondary to hypovolemia. Resolved with IV fluids and blood transfusions.  Diffuse mild abdominal pain. Recent KUB shows colonic ileus. CT scan of his abdomen earlier was nonacute. Ultrasound was consistent with cirrhosis and mild ascites.  Alcoholic liver disease with jaundilic hepatitis/sonographic evidence of cirrhosis.. Also noted on CT was diffuse fatty infiltration of the liver. He also has gallbladder sludge and mild abdominal and pelvic ascites. He is on vitamin therapy and is status post Ativan alcohol withdrawal protocol. Viral hepatitis serologies negative.   Coagulopathy. He is status post 2 units of fresh frozen plasma and vitamin K.  Hepatic encephalopathy, with with alcohol and Xanax withdrawal  delirium. The Ativan being given may have been  contributing to his delirium. His ammonia level has normalized on lactulose. Small dosing of Xanax has been restarted to temper withdrawal. Seroquel was started as well.  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 Secondary to Pseudomonas. Rocephin and Avelox were discontinued in favor favor of Cipro which was started 12/16/2011.  Hypocalcemia secondary to hypoalbuminemia. His blood calcium corrects to 8.3.  Hypernatremia. Resolved with hypotonic IV fluids.  Hyperglycemia. No known prior history of diabetes. This is likely secondary to dextrose and IV fluids. His hemoglobin A1c is 4.8.  Acute renal failure, secondary to prerenal azotemia. Resolved.  Pulmonary crackles. His chest x-ray reveals no significant edema or infiltrates. Treating with nebulizers. Of note, his pro BNP is elevated, but he does not  appear to be volume overloaded.  Per his brother Doreatha Martin and his son Thayer Ohm, the patient has a long history of prescription drug abuse. Up until a couple months ago, he was taking 20-25 Percocet daily. He had been on methadone in the past. He takes Xanax as prescribed  but apparently abuses them by taking too many at a time. Over the past few days, prior to hospital admission, he had been drinking very little alcohol, but he continued to take an average of 5-10 Xanax daily.  Severe debility and likely severe deconditioning. Physical therapy pending. It is likely that the patient will need skilled nursing facility placement. Placement may be limited by financial constraints/insurance constraints. Will ask the social worker and case manager to look into this.    Plan: 1. Hematology consultation to evaluate thrombocytopenia with schistocytes. 2. Continue supportive treatment. 3. PT consultation. 4. Will transfer out of the ICU to telemetry in a room with the camera or sitter for safety. 5. Discussed disposition with the patient's brother Doreatha Martin. Doreatha Martin is in the process of applying for Medicaid, Medicare and disability for the patient. We'll as the case Production designer, theatre/television/film and social worker to continue to assist the patient and family.      LOS: 5 days   Ivorie Uplinger 12/18/2011, 8:14 AM

## 2011-12-18 NOTE — Progress Notes (Signed)
We attempted to do a PT eval, but pt is found to be in DTs.He is having tremors and is incoherent.  We will follow daily and see him as is appropriate.

## 2011-12-19 ENCOUNTER — Encounter (HOSPITAL_COMMUNITY): Payer: Self-pay | Admitting: Internal Medicine

## 2011-12-19 DIAGNOSIS — D649 Anemia, unspecified: Secondary | ICD-10-CM

## 2011-12-19 DIAGNOSIS — K260 Acute duodenal ulcer with hemorrhage: Secondary | ICD-10-CM

## 2011-12-19 DIAGNOSIS — F1027 Alcohol dependence with alcohol-induced persisting dementia: Secondary | ICD-10-CM

## 2011-12-19 DIAGNOSIS — K922 Gastrointestinal hemorrhage, unspecified: Secondary | ICD-10-CM

## 2011-12-19 DIAGNOSIS — R7689 Other specified abnormal immunological findings in serum: Secondary | ICD-10-CM | POA: Diagnosis present

## 2011-12-19 DIAGNOSIS — D696 Thrombocytopenia, unspecified: Secondary | ICD-10-CM

## 2011-12-19 DIAGNOSIS — E782 Mixed hyperlipidemia: Secondary | ICD-10-CM

## 2011-12-19 DIAGNOSIS — R768 Other specified abnormal immunological findings in serum: Secondary | ICD-10-CM

## 2011-12-19 DIAGNOSIS — F1010 Alcohol abuse, uncomplicated: Secondary | ICD-10-CM

## 2011-12-19 DIAGNOSIS — D62 Acute posthemorrhagic anemia: Secondary | ICD-10-CM

## 2011-12-19 HISTORY — DX: Other specified abnormal immunological findings in serum: R76.8

## 2011-12-19 LAB — GLUCOSE, CAPILLARY
Glucose-Capillary: 109 mg/dL — ABNORMAL HIGH (ref 70–99)
Glucose-Capillary: 112 mg/dL — ABNORMAL HIGH (ref 70–99)
Glucose-Capillary: 105 mg/dL — ABNORMAL HIGH (ref 70–99)
Glucose-Capillary: 82 mg/dL (ref 70–99)
Glucose-Capillary: 85 mg/dL (ref 70–99)
Glucose-Capillary: 85 mg/dL (ref 70–99)

## 2011-12-19 LAB — CBC
HCT: 28.9 % — ABNORMAL LOW (ref 39.0–52.0)
MCHC: 33.2 g/dL (ref 30.0–36.0)
Platelets: 99 10*3/uL — ABNORMAL LOW (ref 150–400)
RDW: 25.1 % — ABNORMAL HIGH (ref 11.5–15.5)
WBC: 10.4 10*3/uL (ref 4.0–10.5)

## 2011-12-19 MED ORDER — TIOTROPIUM BROMIDE MONOHYDRATE 18 MCG IN CAPS
18.0000 ug | ORAL_CAPSULE | Freq: Every day | RESPIRATORY_TRACT | Status: DC
  Administered 2011-12-20 – 2011-12-21 (×2): 18 ug via RESPIRATORY_TRACT
  Filled 2011-12-19: qty 5

## 2011-12-19 MED ORDER — HALOPERIDOL 2 MG PO TABS
3.0000 mg | ORAL_TABLET | Freq: Four times a day (QID) | ORAL | Status: DC | PRN
  Administered 2011-12-19 – 2011-12-20 (×2): 3 mg via ORAL
  Filled 2011-12-19 (×2): qty 2

## 2011-12-19 MED ORDER — HALOPERIDOL LACTATE 5 MG/ML IJ SOLN: 3.0000 mg | Freq: Four times a day (QID) | INTRAMUSCULAR | Status: DC | PRN

## 2011-12-19 MED ORDER — QUETIAPINE FUMARATE 25 MG PO TABS
25.0000 mg | ORAL_TABLET | Freq: Two times a day (BID) | ORAL | Status: DC
  Administered 2011-12-19 – 2011-12-21 (×5): 25 mg via ORAL
  Filled 2011-12-19 (×7): qty 1

## 2011-12-19 NOTE — Progress Notes (Signed)
Subjective: Events noted for the patient's confusion and agitation overnight. He pulled out his PICC line and then subsequently, the peripheral IV. Haldol was given on 2 occasions intramuscularly. The patient is currently calm, alert, and sitting up eating breakfast with the assistance of the nursing tech.   Objective: Vital signs in last 24 hours: Filed Vitals:   12/18/11 2204 12/19/11 0700 12/19/11 0728 12/19/11 0951  BP: 110/72     Pulse: 102     Temp: 98.6 F (37 C)     TempSrc: Oral     Resp: 20     Height:      Weight:  108.5 kg (239 lb 3.2 oz)    SpO2: 96%  98% 94%    Intake/Output Summary (Last 24 hours) at 12/19/11 1005 Last data filed at 12/19/11 0800  Gross per 24 hour  Intake    465 ml  Output   1100 ml  Net   -635 ml    Weight change: -3.5 kg (-7 lb 11.5 oz)  Physical exam: Lungs: Occasional crackles, cleared with coughing. Heart: S1, S2, with no audible murmurs rubs or gallops. Abdomen: Obese, positive bowel sounds, mildly  distended and less tympanic, mild diffuse tenderness.. GU: Foley catheter in place. Urine is dark yellow. Extremities: No pedal edema. Neuro/psychiatric: He is more alert, but intermittently confused. He is oriented to himself and [facility]. Cranial nerves are grossly intact.   Lab Results: Basic Metabolic Panel:  Basename 12/18/11 0541 12/17/11 0500  NA 142 144  K 3.9 3.9  CL 114* 114*  CO2 22 23  GLUCOSE 94 146*  BUN 12 15  CREATININE 0.84 1.03  CALCIUM 6.7* 6.7*  MG -- --  PHOS -- --   Liver Function Tests: No results found for this basename: AST:2,ALT:2,ALKPHOS:2,BILITOT:2,PROT:2,ALBUMIN:2 in the last 72 hours No results found for this basename: LIPASE:2,AMYLASE:2 in the last 72 hours No results found for this basename: AMMONIA:2 in the last 72 hours CBC:  Basename 12/19/11 0537 12/18/11 0541  WBC 10.4 8.5  NEUTROABS -- --  HGB 9.6* 8.7*  HCT 28.9* 26.2*  MCV 104.7* 105.2*  PLT 99* 90*   Cardiac  Enzymes: No results found for this basename: CKTOTAL:3,CKMB:3,CKMBINDEX:3,TROPONINI:3 in the last 72 hours BNP: No results found for this basename: PROBNP:3 in the last 72 hours D-Dimer:  Basename 12/17/11 0500  DDIMER 6.55*   CBG:  Basename 12/19/11 0740 12/18/11 2203 12/18/11 1618 12/18/11 1221 12/16/11 1139  GLUCAP 85 88 93 123* 115*   Hemoglobin A1C: No results found for this basename: HGBA1C in the last 72 hours Fasting Lipid Panel: No results found for this basename: CHOL,HDL,LDLCALC,TRIG,CHOLHDL,LDLDIRECT in the last 72 hours Thyroid Function Tests: No results found for this basename: TSH,T4TOTAL,FREET4,T3FREE,THYROIDAB in the last 72 hours Anemia Panel: No results found for this basename: VITAMINB12,FOLATE,FERRITIN,TIBC,IRON,RETICCTPCT in the last 72 hours Coagulation:  Basename 12/17/11 0500  LABPROT 21.7*21.7*  INR 1.85*1.85*   Urine Drug Screen: Drugs of Abuse     Component Value Date/Time   LABOPIA NONE DETECTED 12/13/2011 1102   COCAINSCRNUR NONE DETECTED 12/13/2011 1102   LABBENZ POSITIVE* 12/13/2011 1102   AMPHETMU NONE DETECTED 12/13/2011 1102   THCU NONE DETECTED 12/13/2011 1102   LABBARB NONE DETECTED 12/13/2011 1102    Alcohol Level: No results found for this basename: ETH:2 in the last 72 hours Urinalysis: No results found for this basename: COLORURINE:2,APPERANCEUR:2,LABSPEC:2,PHURINE:2,GLUCOSEU:2,HGBUR:2,BILIRUBINUR:2,KETONESUR:2,PROTEINUR:2,UROBILINOGEN:2,NITRITE:2,LEUKOCYTESUR:2 in the last 72 hours Misc. Labs:   Micro: Recent Results (from the past 240 hour(s))  CULTURE,  BLOOD (ROUTINE X 2)     Status: Normal   Collection Time   12/13/11 10:32 AM      Component Value Range Status Comment   Specimen Description BLOOD RIGHT ANTECUBITAL   Final    Special Requests     Final    Value: BOTTLES DRAWN AEROBIC AND ANAEROBIC AEB=6CC ANA=4CC   Culture NO GROWTH 5 DAYS   Final    Report Status 12/18/2011 FINAL   Final   CULTURE, BLOOD (ROUTINE X 2)      Status: Normal   Collection Time   12/13/11 10:33 AM      Component Value Range Status Comment   Specimen Description BLOOD LEFT HAND   Final    Special Requests     Final    Value: BOTTLES DRAWN AEROBIC AND ANAEROBIC AEB=8CC ANA=4CC   Culture NO GROWTH 5 DAYS   Final    Report Status 12/18/2011 FINAL   Final   URINE CULTURE     Status: Normal   Collection Time   12/13/11 11:02 AM      Component Value Range Status Comment   Specimen Description URINE, CATHETERIZED   Final    Special Requests NONE   Final    Culture  Setup Time [id number]   Final    Colony Count >=100,000 COLONIES/ML   Final    Culture PSEUDOMONAS AERUGINOSA   Final    Report Status 12/16/2011 FINAL   Final    Organism ID, Bacteria PSEUDOMONAS AERUGINOSA   Final   MRSA PCR SCREENING     Status: Abnormal   Collection Time   12/13/11  3:04 PM      Component Value Range Status Comment   MRSA by PCR POSITIVE (*) NEGATIVE  Final     Studies/Results: No results found.  Medications:  Scheduled:    . ALPRAZolam  0.5 mg Oral TID  . antiseptic oral rinse  15 mL Mouth Rinse q12n4p  . Chlorhexidine Gluconate Cloth  6 each Topical Q0600  . ciprofloxacin  500 mg Oral BID  . cloNIDine  0.1 mg Oral QHS  . feeding supplement  237 mL Oral BID BM  . feeding supplement  30 mL Oral TID WC  . Fluticasone-Salmeterol  1 puff Inhalation Q12H  . folic acid  1 mg Oral Daily  . lactulose  10 g Oral BID  . levalbuterol  0.63 mg Nebulization Once  . levalbuterol  0.63 mg Nebulization Q6H  . mupirocin ointment  1 application Nasal BID  . pantoprazole  40 mg Oral BID AC  . QUEtiapine  25 mg Oral BID  . sodium chloride  10-40 mL Intracatheter Q12H  . sodium chloride      . thiamine  100 mg Oral Daily  . DISCONTD: chlorhexidine  15 mL Mouth Rinse BID  . DISCONTD: QUEtiapine  12.5 mg Oral BID   Continuous:    . dextrose 5 % with kcl 65 mL/hr at 12/18/11 1100   LKG:[id number] lactate, levalbuterol, morphine injection,  ondansetron (ZOFRAN) IV, ondansetron, sodium chloride  Assessment: Principal Problem:  *GI bleed Active Problems:  Smoker, current status unknown  Acute blood loss anemia  Alcohol abuse  Jaundice  Coagulopathy  Macrocytic anemia  Renal insufficiency  Hepatic encephalopathy  UTI (urinary tract infection)  Debility  Frequent falls  Hepatic steatosis  Compression fracture of L1 lumbar vertebra  Hypocalcemia  Acute renal failure  Hypernatremia  Hypotension  Hyperglycemia  Alcoholic hepatitis  Abdominal pain, diffuse  Thrombocytopenia  Duodenal ulcer with hemorrhage  Esophageal varices without bleeding  Cirrhosis  Hx of substance abuse  Adynamic ileus  Alcohol dependence with alcohol-induced persisting dementia  Helicobacter pylori antibody positive     1. GI bleed with melena,  secondary to duodenal ulcer per EGD and Dr. Karilyn Cota. Status post octreotide for several days.   Esophageal varices without bleeding.  H. pylori positive. Dr. Karilyn Cota recommends starting Pylera 3 capsules 4 times daily for 10 days at the time of discharge.  Acute blood loss anemia. Hemoglobin is holding in the 8.5-9.5range. He has macrocytosis with an elevated vitamin B12 level and borderline low folate level. He is status post 5 units of packed red blood cell transfusion in all. There was some bruising on his lower back noted. CT scan of his abdomen and pelvis did not reveal a retroperitoneal bleed.  Delirium. At this point, the patient's delirium is likely secondary to alcoholic dementia. His hepatic encephalopathy is being treated with lactulose. His ammonia level has normalized. Alcohol withdrawal delirium is less likely as the patient's son Thayer Ohm and brother Doreatha Martin stated that he was barely drinking before admission. However, he was taking Xanax and apparently abusing Xanax by taking too many. Xanax was restarted yesterday at half the dose, but will consider increasing it. Seroquel was started as well.  We'll consider increasing it. Clonidine is being given at bedtime for its central nervous system effects.  Thrombocytopenia. This may be multifactorial including alcohol abuse, cirrhosis, acute illness/infection, and hemodilution. However,  DIC panel reveals schistocytes. Hematology has been consulted.  Alcoholic liver disease with jaundilic hepatitis/sonographic evidence of cirrhosis.. Also noted on CT was diffuse fatty infiltration of the liver. He also has gallbladder sludge and mild abdominal and pelvic ascites. He is on vitamin therapy and is status post Ativan alcohol withdrawal protocol. Viral hepatitis serologies negative.   Coagulopathy. He is status post 2 units of fresh frozen plasma and vitamin K.  Urinary tract infection. Secondary to Pseudomonas. Rocephin and Avelox were discontinued in favor favor of Cipro which was started 12/16/2011.  Hypocalcemia secondary to hypoalbuminemia. His blood calcium corrects to 8.3. Resolved hypernatremia.  Hyperglycemia secondary to dextrose and IV fluids.Marland Kitchen His hemoglobin A1c is 4.8.  Acute renal failure, secondary to prerenal azotemia. Resolved.  Pulmonary crackles/COPD.Marland Kitchen He smokes cigarettes but very few prior to admission according to his family. Will continue Xopenex and Advair. His chest x-ray reveals no significant edema or or infiltrates.  Per his brother Doreatha Martin and his son Thayer Ohm, the patient has a long history of prescription drug abuse. Up until a couple months ago, he was taking 20-25 Percocet daily. He had been on methadone in the past. He takes Xanax as prescribed but apparently abuses them by taking too many at a time. Over the past few days, prior to hospital admission, he had been drinking very little alcohol, but he continued to take an average of 5-10 Xanax daily.  Severe debility and likely severe deconditioning. Physical therapy pending. The patient was too confused to participate yesterday.  He will need skilled nursing facility  placement. Placement may be limited by financial constraints/insurance constraints and delirium. Will ask the social worker and case manager to look into this.    Plan: 1. Hematology consult pending. 2. The clinical social worker is involved in trying to place the patient. This is being limited by financial constraints/instrument insurance constraints, and the patient's delirium. Hopefully, a skilled nursing facility bed will be acquired over the next few days. 3. Will increase  the Seroquel to 25 mg twice a day. 4. Will continue Xanax at its current dosing, but consider increasing it tomorrow in light of the increase in Seroquel today. 5. We'll add Spiriva.      LOS: 6 days   Tanijah Morais 12/19/2011, 10:05 AM

## 2011-12-19 NOTE — Progress Notes (Signed)
Patient pulled out IV again earlier today.  Patient has had several attempts with IV since admission.  MD notified, orders to leave IV out.  Patient has rested well for most of the afternoon.  Has not required any PRN meds today

## 2011-12-19 NOTE — Consult Note (Signed)
[facility] Consultation Oncology  Name: Roberto [name]      MRN: [id number]    Location: A316/A316-01  Date: 12/19/2011 Time:4:26 PM   REFERRING PHYSICIAN:  Elliot Cousin, MD  REASON FOR CONSULT:  Schistocytes  HISTORY OF PRESENT ILLNESS:   This is a 66 yo Caucasian male with a past medical history significant for EtOH abuse, GI bleed, hepatic encephalopathy, EtOH hepatitis, bleeding esophageal varices, cirrhosis who we were consulted for due to schistocytes on peripheral blood smear.  Roberto [name] was brought to the ED by EMS and his family provided the patient's history.  They reported that Roberto [name] has had increased confusion and weakness. "He has been very depressed and has not left the house for 3 months. He has not eaten. He continues to drink more than a pint of liquor a day. 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 Also, patient takes Percocet and Xanax, sometimes more than prescribed. Patient falls multiple times a day."  Port of the patient's evaluation in the hospital to Roberto [name] panel. Part of the DIC panel reported schistocytes on smear review. Thus, hematology was consult.  The patient seen lying in bed with mittens on his hand do to multiple IV removals by the patient. He appears to be very noncompliant. He is not 100% alert and oriented today. He is able time he he is at the [facility]. However, during discussion he is noted to slip in and out of sleeping.  Thus, our conversation was very limited. The majority of his answers were incoherent and were not accurate in regards to questions asking.  The patient's laboratory results were reviewed.  He is noted to have anemia with thrombocytopenia. His anemia is likely secondary to GI bleeding. He had an elevated d-dimer, low fibrinogen, elevated PT, elevated INR, and elevated  aPTT in the DIC panel. This was performed on 12/17/2011. Roberto [name] and myself personally reviewed the peripheral blood smear and it is noted that the patient has rare schistocytes.  Presently, the patient denies any pain.  Completion of ROS questioning was inhibited due to the patient's lack of alertness.   PAST MEDICAL HISTORY:   Past Medical History  Diagnosis Date  . COPD (chronic obstructive pulmonary disease)   . Hypertension   . Hepatic steatosis 12/14/2011  . Compression fracture of L1 lumbar vertebra 12/14/2011  . C2 cervical fracture   . Duodenal ulcer with hemorrhage 12/15/2011    Likely source of bleeding.per EGD; Roberto [name]  . Esophageal varices without bleeding 12/15/2011    per EGD  . Cirrhosis 12/16/2011    per ultrasound  . Hx of substance abuse 12/17/2011  . 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 Hepatic encephalopathy   . Helicobacter pylori antibody positive 12/19/2011    ALLERGIES: No Known Allergies    MEDICATIONS: I have reviewed the patient's current medications.     PAST SURGICAL HISTORY Past Surgical History  Procedure Date  . Coronary angioplasty with stent placement     FAMILY HISTORY: History reviewed. No pertinent family history.  SOCIAL HISTORY:  reports that he has been smoking.  He has never used smokeless tobacco. He reports that he drinks alcohol. He reports that he does not use illicit drugs.   PHYSICAL EXAM: Most Recent Vital Signs: Blood pressure 110/72, pulse 102, temperature 98.6 F (37 C), temperature source  Oral, resp. rate 20, height 6\' 1"  (1.854 m), weight 239 lb 3.2 oz (108.5 kg), SpO2 94.00%. General appearance: appears stated age, no distress, moderately obese, slowed mentation, uncooperative and no alert Head: Normocephalic, without obvious abnormality, atraumatic Lungs: clear to auscultation bilaterally Heart: regular rate and rhythm, S1, S2 normal, no murmur, click, rub or gallop Abdomen:  soft, non-tender; bowel sounds normal; no masses,  no organomegaly Pulses: 2+ and symmetric Skin: Skin color, texture, turgor normal. No rashes or lesions Neurologic: Mental status: Alert, oriented, thought content appropriate, alertness: lethargic, orientation: person, place  LABORATORY DATA:  Results for orders placed during the hospital encounter of 12/13/11 (from the past 48 hour(s))  GLUCOSE, CAPILLARY     Status: Normal   Collection Time   12/17/11  5:05 PM      Component Value Range Comment   Glucose-Capillary 89  70 - 99 (mg/dL)    Comment 1 Notify RN      Comment 2 Documented in Chart     GLUCOSE, CAPILLARY     Status: Abnormal   Collection Time   12/17/11  9:15 PM      Component Value Range Comment   Glucose-Capillary 105 (*) 70 - 99 (mg/dL)    Comment 1 Notify RN     CBC     Status: Abnormal   Collection Time   12/18/11  5:41 AM      Component Value Range Comment   WBC 8.5  4.0 - 10.5 (K/uL)    RBC 2.49 (*) 4.22 - 5.81 (MIL/uL)    Hemoglobin 8.7 (*) 13.0 - 17.0 (g/dL)    HCT 96.0 (*) 45.4 - 52.0 (%)    MCV 105.2 (*) 78.0 - 100.0 (fL)    MCH 34.9 (*) 26.0 - 34.0 (pg)    MCHC 33.2  30.0 - 36.0 (g/dL)    RDW 09.8 (*) 11.9 - 15.5 (%)    Platelets 90 (*) 150 - 400 (K/uL)   BASIC METABOLIC PANEL     Status: Abnormal   Collection Time   12/18/11  5:41 AM      Component Value Range Comment   Sodium 142  135 - 145 (mEq/L)    Potassium 3.9  3.5 - 5.1 (mEq/L)    Chloride 114 (*) 96 - 112 (mEq/L)    CO2 22  19 - 32 (mEq/L)    Glucose, Bld 94  70 - 99 (mg/dL)    BUN 12  6 - 23 (mg/dL)    Creatinine, Ser 1.47  0.50 - 1.35 (mg/dL)    Calcium 6.7 (*) 8.4 - 10.5 (mg/dL)    GFR calc non Af Amer >90  >90 (mL/min)    GFR calc Af Amer >90  >90 (mL/min)   GLUCOSE, CAPILLARY     Status: Normal   Collection Time   12/18/11  7:31 AM      Component Value Range Comment   Glucose-Capillary 82  70 - 99 (mg/dL)   GLUCOSE, CAPILLARY     Status: Abnormal   Collection Time   12/18/11 12:21  PM      Component Value Range Comment   Glucose-Capillary 123 (*) 70 - 99 (mg/dL)    Comment 1 Notify RN      Comment 2 Documented in Chart     GLUCOSE, CAPILLARY     Status: Normal   Collection Time   12/18/11  4:18 PM      Component Value Range Comment   Glucose-Capillary 93  70 -  99 (mg/dL)    Comment 1 Documented in Chart      Comment 2 Notify RN     GLUCOSE, CAPILLARY     Status: Normal   Collection Time   12/18/11 10:03 PM      Component Value Range Comment   Glucose-Capillary 88  70 - 99 (mg/dL)   CBC     Status: Abnormal   Collection Time   12/19/11  5:37 AM      Component Value Range Comment   WBC 10.4  4.0 - 10.5 (K/uL)    RBC 2.76 (*) 4.22 - 5.81 (MIL/uL)    Hemoglobin 9.6 (*) 13.0 - 17.0 (g/dL)    HCT 40.9 (*) 81.1 - 52.0 (%)    MCV 104.7 (*) 78.0 - 100.0 (fL)    MCH 34.8 (*) 26.0 - 34.0 (pg)    MCHC 33.2  30.0 - 36.0 (g/dL)    RDW 91.4 (*) 78.2 - 15.5 (%)    Platelets 99 (*) 150 - 400 (K/uL)   GLUCOSE, CAPILLARY     Status: Normal   Collection Time   12/19/11  7:40 AM      Component Value Range Comment   Glucose-Capillary 85  70 - 99 (mg/dL)    Comment 1 Documented in Chart      Comment 2 Notify RN     GLUCOSE, CAPILLARY     Status: Abnormal   Collection Time   12/19/11 11:35 AM      Component Value Range Comment   Glucose-Capillary 107 (*) 70 - 99 (mg/dL)    Comment 1 Notify RN      Comment 2 Documented in Chart           PATHOLOGY:   Review of peripheral blood smear reveals decreased platelets and a rare schistocyte.  ASSESSMENT:  1. Schistocyte, rare, not clinically significant. 2. Thrombocytopenia, secondary to liver disease and/or consumption from GI bleed. 3. Anemia, S/P 5 units PRBCs and 2 units FFP. 4. GI bleed, S/P 5 units PRBCs. 5. EtOH abuse 6. Hepatic encephalopathy 7. Alcoholic hepatitis 8. Duodenal ulcer with hemorrhage 9. Esophageal varices 10. Cirrhosis  PLAN:  1. Lab work tomorrow AM: CBC diff, PT, aPTT, D-Dimer 2. Peripheral  blood smear reviewed.  Rare schistocyte on peripheral blood smear.  Not clinically significant. 3. Will continue to follow while an inpatient and assist in patient care as deemed fit.   All questions were answered. The patient knows to call the clinic with any problems, questions or concerns. We can certainly see the patient much sooner if necessary.  The patient and plan discussed with Glenford Peers, MD and he is in agreement with the aforementioned.  Victora Irby

## 2011-12-19 NOTE — Clinical Social Work Placement (Addendum)
    Clinical Social Work Department CLINICAL SOCIAL WORK PLACEMENT NOTE 12/21/2011  Patient:  Roberto [name], Roberto [name]  Account Number:  [id number] Admit date:  12/13/2011  Clinical Social Worker:  Santa Genera, CLINICAL SOCIAL WORKER  Date/time:  12/19/2011 12:30 PM  Clinical Social Work is seeking post-discharge placement for this patient at the following level of care:   SKILLED NURSING   (*CSW will update this form in Epic as items are completed)   12/19/2011  Patient/family provided with [facility] Department of Clinical Social Work's list of facilities offering this level of care within the geographic area requested by the patient (or if unable, by the patient's family).  12/19/2011  Patient/family informed of their freedom to choose among providers that offer the needed level of care, that participate in Medicare, Medicaid or managed care program needed by the patient, have an available bed and are willing to accept the patient.  12/19/2011  Patient/family informed of [facility]' ownership interest in [facility], as well as of the fact that they are under no obligation to receive care at this facility.  PASARR submitted to EDS on 12/19/2011 PASARR number received from [employer] on   FL2 transmitted to all facilities in geographic area requested by pt/family on  12/19/2011 FL2 transmitted to all facilities within larger geographic area on 12/19/2011  Patient informed that his/her managed care company has contracts with or will negotiate with  certain facilities, including the following:     Patient/family informed of bed offers received:  12/21/2011 Patient chooses bed at [facility] Physician recommends and patient chooses bed at    Patient to be transferred to [facility] on  12/21/2011 Patient to be transferred to facility by Facility [facility]  The following physician request were entered in Epic:   Additional Comments: Bed offer received  from [facility] and [facility] [place] facility.  [facility] requested Nurses Notes, which were sent 12-20-11.  Bed offer received from [facility]  Family agreeable to [facility].  [name] Clinical Social Worker [phone])

## 2011-12-19 NOTE — Progress Notes (Signed)
Several attempts have been made to see this pt today for eval.  He has been either agitated, combative, or unwilling to work with me.   I will try again tomorrow.

## 2011-12-19 NOTE — Progress Notes (Signed)
UR chart review completed. Roberto [name]  

## 2011-12-19 NOTE — Clinical Social Work Note (Signed)
Met w patient to complete SBIRT.  Patient able to answer questions about current alcohol use and prior hx of methadone treatment. Patient significantly restless, stating that he wanted to leave hospital.  Admits to use of 1/2int of alcohol twice a day, denies use of any other substances including beer, wine, prescribed/nonprescribed substances.  States he was involved w methadone clinic in [place] for "years" but stopped approx six months ago due to distance and cost.  Patient states that he wants treatment for his alcohol use. Unable to complete full assessment as patient stated he did not want to talk any longer.  Patient signed Medicaid application with Roberto [name], Financial Counselor, who will submit application to begin process of applying for Medicaid.  Patient's brother, Roberto [name], is power of attorney for patient and has made appointment for social security disability on January 07, 2012.  Left message for Mr. Roberto [name] to return call to [facility] or myself re: Medicaid application assistance.    [name] Clinical Social Worker [phone])

## 2011-12-19 NOTE — Progress Notes (Signed)
Patient agitated safety sitter at bedside bilateral hand mitts in place, patient attempting to get out bed, haldol 3mg  given given via IV right hand as this Clinical research associate as attempting medication administration IV came out, patient too combative to attempt IV restart, another haldol 3mg  pulled given IM right deltoid

## 2011-12-19 NOTE — Progress Notes (Signed)
Subjective; Patient has no complaints. He knows he is at [facility]. He states his appetite is better. He denies abdominal pain nausea or vomiting. Objective; BP 110/72  Pulse 102  Temp(Src) 98.6 F (37 C) (Oral)  Resp 20  Ht 6\' 1"  (1.854 m)  Wt 239 lb 3.2 oz (108.5 kg)  BMI 31.56 kg/m2  SpO2 98% Patient is alert and oriented x1 His abdomen is protuberant but soft and nontender without organomegaly or masses. Lab data; H. pylori is positive WBC 10.4, hemoglobin 9.6, hematocrit 28.9, platelet 99K. Assessment; #1. Anemia secondary to upper GI bleed due to 2 large duodenal ulcer. H. pylori is positive. H&H low but stable. #2. Alcoholic liver disease. He has evidence of cirrhosis on imaging studies. He does not have splenomegaly. As alcoholic hepatitis improves hepatic function should recover.  #3. Thrombocytopenia and coagulopathy possibly due to alcoholic liver disease. DIC screen abnormal and patient is being evaluated by Dr. Mariel Sleet and associates. #4. UTI. #5. Hepatic encephalopathy. Clinically improved. Some of this confusion may be related to alcohol withdrawal. Recommendations; Initiate therapy with Pylera 3 capsules 4 times a day for 10 days on discharge. He will continue double dose PPI for 12 weeks and thereafter once daily.

## 2011-12-19 NOTE — Progress Notes (Signed)
Patient has continued to rest well this afternoon.  Patient has been very sleepy.  Attempted to awaken patient 3 times to take meds and eat dinner.  Patient answers "no" when asked if he will take his meds and sates that he does not want to eat at this time, is sleepy, and wants to continue sleeping.

## 2011-12-20 DIAGNOSIS — D62 Acute posthemorrhagic anemia: Secondary | ICD-10-CM

## 2011-12-20 DIAGNOSIS — F1027 Alcohol dependence with alcohol-induced persisting dementia: Secondary | ICD-10-CM

## 2011-12-20 DIAGNOSIS — K260 Acute duodenal ulcer with hemorrhage: Secondary | ICD-10-CM

## 2011-12-20 DIAGNOSIS — E782 Mixed hyperlipidemia: Secondary | ICD-10-CM

## 2011-12-20 LAB — DIFFERENTIAL
Basophils Absolute: 0 10*3/uL (ref 0.0–0.1)
Basophils Relative: 0 % (ref 0–1)
Monocytes Absolute: 0.8 10*3/uL (ref 0.1–1.0)
Neutro Abs: 5.1 10*3/uL (ref 1.7–7.7)
Neutrophils Relative %: 65 % (ref 43–77)

## 2011-12-20 LAB — CBC
Hemoglobin: 8.8 g/dL — ABNORMAL LOW (ref 13.0–17.0)
MCH: 35.2 pg — ABNORMAL HIGH (ref 26.0–34.0)
MCHC: 33.2 g/dL (ref 30.0–36.0)
Platelets: 95 10*3/uL — ABNORMAL LOW (ref 150–400)

## 2011-12-20 LAB — GLUCOSE, CAPILLARY: Glucose-Capillary: 148 mg/dL — ABNORMAL HIGH (ref 70–99)

## 2011-12-20 LAB — PROTIME-INR: Prothrombin Time: 19.4 seconds — ABNORMAL HIGH (ref 11.6–15.2)

## 2011-12-20 LAB — APTT: aPTT: 39 seconds — ABNORMAL HIGH (ref 24–37)

## 2011-12-20 MED ORDER — ENSURE COMPLETE PO LIQD
237.0000 mL | Freq: Three times a day (TID) | ORAL | Status: DC
  Administered 2011-12-20 – 2011-12-21 (×3): 237 mL via ORAL

## 2011-12-20 NOTE — Evaluation (Signed)
Physical Therapy Evaluation Patient Details Name: Roberto [name] MRN: [id number] DOB: Jul 07, 1946 Today's Date: 12/20/2011 Time: 4098-1191 PT Time Calculation (min): 40 min  PT Assessment / Plan / Recommendation Clinical Impression       PT Assessment       Follow Up Recommendations       Barriers to Discharge        lEquipment Recommendations       Recommendations for Other Services     Frequency      Precautions / Restrictions Precautions Precautions: Fall Restrictions Weight Bearing Restrictions: No   Pertinent Vitals/Pain       Mobility  Bed Mobility Bed Mobility: Supine to Sit;Sit to Supine Supine to Sit: 7: Independent Sit to Supine: 7: Independent Transfers Transfers: Sit to Stand;Stand to Sit Sit to Stand: 7: Independent Stand to Sit: 7: Independent Ambulation/Gait Ambulation/Gait Assistance: 4: Min guard Ambulation Distance (Feet): 100 Feet Assistive device: Rolling walker Ambulation/Gait Assistance Details: gait without walker is mildly unstable (laterally)...pt is very agreeable to use a walker Gait Pattern: Within Functional Limits Gait velocity: WNL Stairs: No Wheelchair Mobility Wheelchair Mobility: No    Exercises     PT Diagnosis:    PT Problem List:   PT Treatment Interventions:     PT Goals Acute Rehab PT Goals PT Goal Formulation: With patient Time For Goal Achievement: 12/27/11 Potential to Achieve Goals: Good Pt will Ambulate: 51 - 150 feet;with supervision;with least restrictive assistive device PT Goal: Ambulate - Progress: Goal set today  Visit Information  Last PT Received On: 12/20/11    Subjective Data  Subjective: I want to get home Patient Stated Goal: return home   Prior Functioning  Home Living Lives With: Family Available Help at Discharge: Family Type of Home: Apartment Home Access: Level entry Home Layout: One level Home Adaptive Equipment: Environmental consultant - rolling;Straight cane Prior Function Level of  Independence: Independent Able to Take Stairs?: Yes Communication Communication: No difficulties    Cognition  Overall Cognitive Status: Appears within functional limits for tasks assessed/performed Arousal/Alertness: Awake/alert Orientation Level: Appears intact for tasks assessed Behavior During Session: [facility] for tasks performed Cognition - Other Comments: has poor memory    Extremity/Trunk Assessment Right Upper Extremity Assessment RUE ROM/Strength/Tone: WFL for tasks assessed RUE Sensation: WFL - Light Touch;WFL - Proprioception RUE Coordination: WFL - gross motor Left Upper Extremity Assessment LUE ROM/Strength/Tone: WFL for tasks assessed LUE Sensation: WFL - Light Touch;WFL - Proprioception LUE Coordination: WFL - gross motor Right Lower Extremity Assessment RLE ROM/Strength/Tone: WFL for tasks assessed RLE Sensation: WFL - Light Touch;WFL - Proprioception RLE Coordination: WFL - gross motor Left Lower Extremity Assessment LLE ROM/Strength/Tone: WFL for tasks assessed LLE Sensation: WFL - Light Touch;WFL - Proprioception LLE Coordination: WFL - gross motor Trunk Assessment Trunk Assessment: Normal   Balance Balance Balance Assessed: No  End of Session     [name] 12/20/2011, 11:21 AM

## 2011-12-20 NOTE — Progress Notes (Signed)
Subjective: The patient is more alert today.  He answers questions appropriately.  He reports that he never wishes to come back to the Hospital.  I recommended to the patient that he must quite his EtOH abuse or he likely will return to the hospital in the future.   He denies any abdominal pain.  He denies any chest pain.  He denies any bleeding.   Objective: Vital signs in last 24 hours: Temp:  [97.7 F (36.5 C)-98.3 F (36.8 C)] 97.7 F (36.5 C) (05/22 0534) Pulse Rate:  [85-106] 85  (05/22 0534) Resp:  [20-24] 20  (05/22 0534) BP: (112-134)/(69-79) 112/69 mmHg (05/22 0534) SpO2:  [94 %-98 %] 98 % (05/22 0850)  Intake/Output from previous day: 05/21 0800 - 05/22 0759 In: 60 [P.O.:60] Out: -  Intake/Output this shift: Total I/O In: -  Out: 150 [Urine:150]  General appearance: alert, cooperative, appears older than stated age, icteric, no distress and moderately obese Resp: rhonchi bibasilar Cardio: regular rate and rhythm, S1, S2 normal, no murmur, click, rub or gallop GI: abnormal findings:  distended and obese Extremities: edema trace B/L LE pitting edema  Lab Results:   Basename 12/20/11 0510 12/19/11 0537  WBC 7.9 10.4  HGB 8.8* 9.6*  HCT 26.5* 28.9*  PLT 95* 99*   BMET  Basename 12/18/11 0541  NA 142  K 3.9  CL 114*  CO2 22  GLUCOSE 94  BUN 12  CREATININE 0.84  CALCIUM 6.7*    Studies/Results: No results found.  Medications: I have reviewed the patient's current medications.  Assessment/Plan: 1. Schistocyte, rare, not clinically significant.  2. Thrombocytopenia, secondary to liver disease and/or consumption from GI bleed.  Stable 3. Anemia, S/P 5 units PRBCs and 2 units FFP.  Stable 4. GI bleed, S/P 5 units PRBCs.  5. EtOH abuse  6. Hepatic encephalopathy  7. Alcoholic hepatitis  8. Duodenal ulcer with hemorrhage  9. Esophageal varices  10. Cirrhosis 11. The patient's lab work is improving.  We recommend EtOH cessation, nutritional, and  vitamin support if needed.    LOS: 7 days    Aaric Dolph 12/20/2011

## 2011-12-20 NOTE — Progress Notes (Addendum)
Chart reviewed.  Subjective: No complaints. Confused and does not remember me. "Did we go out? " No new issues per nursing staff.   Objective: Vital signs in last 24 hours: Filed Vitals:   12/19/11 1920 12/19/11 1927 12/20/11 0534 12/20/11 0850  BP: 134/79  112/69   Pulse: 106  85   Temp: 98.3 F (36.8 C)  97.7 F (36.5 C)   TempSrc:   Oral   Resp: 24  20   Height:      Weight:      SpO2: 95% 95% 97% 98%   Weight change:   Intake/Output Summary (Last 24 hours) at 12/20/11 1156 Last data filed at 12/20/11 1049  Gross per 24 hour  Intake      0 ml  Output    150 ml  Net   -150 ml   General: Comfortable and calm. Cooperative and pleasant. Watching TV. Confused and disoriented. Drinking coffee. Lungs clear to auscultation bilaterally without wheeze rhonchi or rales Cardiovascular regular rate rhythm without murmurs gallops rubs Abdomen soft nontender nondistended Extremities no clubbing cyanosis or edema. Asterixis is present. Psychiatric: Confused but cooperative.  Lab Results: Basic Metabolic Panel:  Lab 12/18/11 1610 12/17/11 0500  NA 142 144  K 3.9 3.9  CL 114* 114*  CO2 22 23  GLUCOSE 94 146*  BUN 12 15  CREATININE 0.84 1.03  CALCIUM 6.7* 6.7*  MG -- --  PHOS -- --   Liver Function Tests:  Lab 12/16/11 0423 12/15/11 0512  AST 108* 110*  ALT 36 35  ALKPHOS 103 95  BILITOT 4.4* 4.3*  PROT 6.2 5.9*  ALBUMIN 2.0* 1.9*   No results found for this basename: LIPASE:2,AMYLASE:2 in the last 168 hours  Lab 12/16/11 0423  AMMONIA 47   CBC:  Lab 12/20/11 0510 12/19/11 0537  WBC 7.9 10.4  NEUTROABS 5.1 --  HGB 8.8* 9.6*  HCT 26.5* 28.9*  MCV 106.0* 104.7*  PLT 95* 99*   Cardiac Enzymes:  Lab 12/13/11 2217  CKTOTAL 41  CKMB 1.4  CKMBINDEX --  TROPONINI <0.30   BNP:  Lab 12/15/11 0512 12/13/11 2217  PROBNP 3592.0* 679.8*   D-Dimer:  Lab 12/20/11 0510 12/17/11 0500  DDIMER 2.37* 6.55*   CBG:  Lab 12/20/11 1130 12/20/11 0749 12/19/11  2147 12/19/11 1648 12/19/11 1135 12/19/11 0740  GLUCAP 148* 100* 112* 85 107* 85   Hemoglobin A1C:  Lab 12/14/11 1056  HGBA1C 4.8   Fasting Lipid Panel: No results found for this basename: CHOL,HDL,LDLCALC,TRIG,CHOLHDL,LDLDIRECT in the last 960 hours Thyroid Function Tests: No results found for this basename: TSH,T4TOTAL,FREET4,T3FREE,THYROIDAB in the last 168 hours Coagulation:  Lab 12/20/11 0510 12/17/11 0500 12/15/11 0512 12/14/11 0523  LABPROT 19.4* 21.7*21.7* 19.6* 19.7*  INR 1.61* 1.85*1.85* 1.63* 1.64*   Anemia Panel: No results found for this basename: VITAMINB12,FOLATE,FERRITIN,TIBC,IRON,RETICCTPCT in the last 168 hours Urine Drug Screen: Drugs of Abuse     Component Value Date/Time   LABOPIA NONE DETECTED 12/13/2011 1102   COCAINSCRNUR NONE DETECTED 12/13/2011 1102   LABBENZ POSITIVE* 12/13/2011 1102   AMPHETMU NONE DETECTED 12/13/2011 1102   THCU NONE DETECTED 12/13/2011 1102   LABBARB NONE DETECTED 12/13/2011 1102    Alcohol Level: No results found for this basename: ETH:2 in the last 168 hours Urinalysis: No results found for this basename: COLORURINE:2,APPERANCEUR:2,LABSPEC:2,PHURINE:2,GLUCOSEU:2,HGBUR:2,BILIRUBINUR:2,KETONESUR:2,PROTEINUR:2,UROBILINOGEN:2,NITRITE:2,LEUKOCYTESUR:2 in the last 168 hours  Micro Results: Recent Results (from the past 240 hour(s))  CULTURE, BLOOD (ROUTINE X 2)     Status: Normal   Collection  Time   12/13/11 10:32 AM      Component Value Range Status Comment   Specimen Description BLOOD RIGHT ANTECUBITAL   Final    Special Requests     Final    Value: BOTTLES DRAWN AEROBIC AND ANAEROBIC AEB=6CC ANA=4CC   Culture NO GROWTH 5 DAYS   Final    Report Status 12/18/2011 FINAL   Final   CULTURE, BLOOD (ROUTINE X 2)     Status: Normal   Collection Time   12/13/11 10:33 AM      Component Value Range Status Comment   Specimen Description BLOOD LEFT HAND   Final    Special Requests     Final    Value: BOTTLES DRAWN AEROBIC AND ANAEROBIC  AEB=8CC ANA=4CC   Culture NO GROWTH 5 DAYS   Final    Report Status 12/18/2011 FINAL   Final   URINE CULTURE     Status: Normal   Collection Time   12/13/11 11:02 AM      Component Value Range Status Comment   Specimen Description URINE, CATHETERIZED   Final    Special Requests NONE   Final    Culture  Setup Time [id number]   Final    Colony Count >=100,000 COLONIES/ML   Final    Culture PSEUDOMONAS AERUGINOSA   Final    Report Status 12/16/2011 FINAL   Final    Organism ID, Bacteria PSEUDOMONAS AERUGINOSA   Final   MRSA PCR SCREENING     Status: Abnormal   Collection Time   12/13/11  3:04 PM      Component Value Range Status Comment   MRSA by PCR POSITIVE (*) NEGATIVE  Final    Scheduled Meds:   . ALPRAZolam  0.5 mg Oral TID  . antiseptic oral rinse  15 mL Mouth Rinse q12n4p  . ciprofloxacin  500 mg Oral BID  . cloNIDine  0.1 mg Oral QHS  . feeding supplement  237 mL Oral BID BM  . feeding supplement  30 mL Oral TID WC  . Fluticasone-Salmeterol  1 puff Inhalation Q12H  . folic acid  1 mg Oral Daily  . lactulose  10 g Oral BID  . levalbuterol  0.63 mg Nebulization Once  . levalbuterol  0.63 mg Nebulization Q6H  . pantoprazole  40 mg Oral BID AC  . QUEtiapine  25 mg Oral BID  . sodium chloride  10-40 mL Intracatheter Q12H  . thiamine  100 mg Oral Daily  . tiotropium  18 mcg Inhalation Daily   Continuous Infusions:  PRN Meds:.haloperidol, haloperidol lactate, haloperidol lactate, levalbuterol, morphine injection, ondansetron (ZOFRAN) IV, ondansetron, sodium chloride Assessment/Plan: Principal Problem:  *GI bleed Active Problems:  Acute blood loss anemia  Macrocytic anemia  Alcohol abuse  Jaundice  Coagulopathy  Hepatic encephalopathy  UTI (urinary tract infection)  Smoker, current status unknown  Renal insufficiency  Debility  Frequent falls  Hepatic steatosis  Compression fracture of L1 lumbar vertebra  Hypocalcemia  Acute renal failure  Hypernatremia   Hypotension  Hyperglycemia  Alcoholic hepatitis  Abdominal pain, diffuse  Thrombocytopenia  Duodenal ulcer with hemorrhage  Esophageal varices without bleeding  Cirrhosis  Hx of substance abuse  Adynamic ileus  Alcohol dependence with alcohol-induced persisting dementia  Helicobacter pylori antibody positive  Patient is confused, disoriented and lacks capacity for informed consent. He is calm and cooperative. He is medically stable for transfer to skilled nursing facility when disposition is arranged. No family members are currently available.  Patient  has cirrhosis, multiple medical problems, and possible dementia. He likely will qualify for long-term disability.   LOS: 7 days   See Beharry L 12/20/2011, 11:56 AM

## 2011-12-20 NOTE — Clinical Social Work Note (Signed)
FL2 faxed out to facilities 12-19-11.  Phone call to [facility].  Confirms facility will accept LOG for patient and can take him for SNF rehab.  Patient also offered bed at [facility] in [place].  Bed offers will be discussed with patient and family.   [name] Clinical Social Worker [phone])

## 2011-12-20 NOTE — Clinical Social Work Note (Signed)
CSW received bed offer from [facility].  Timothy Lasso, CSW Chiropodist, notified as he will be completing LOG with facility.  Message left with pt's brother requesting return call to discuss d/c plan and provide bed offers.  Planned d/c tomorrow.  MD aware of above.  Derenda Fennel, [place] [phone]

## 2011-12-20 NOTE — Clinical Social Work Psychosocial (Signed)
    Clinical Social Work Department BRIEF PSYCHOSOCIAL ASSESSMENT 12/20/2011  Patient:  FERAS, GARDELLA     Account Number:  [id number]     Admit date:  12/13/2011  Clinical Social Worker:  Santa Genera, CLINICAL SOCIAL WORKER  Date/Time:  12/19/2011 10:00 AM  Referred by:  Physician  Date Referred:   Referred for  Substance Abuse   Other Referral:   Interview type:  Patient Other interview type:    PSYCHOSOCIAL DATA Living Status:  FAMILY Admitted from facility:   Level of care:   Primary support name:  Ramon Dredge (AKA Saml) Guiles Primary support relationship to patient:  SIBLING Degree of support available:   Brother is financial power of attorney for patient. Patient lives with elderly mother who is not able to care for him.    CURRENT CONCERNS Current Concerns  Substance Abuse   Other Concerns:   Family states that patient cannot return to mother's house as she cannot manage him.    SOCIAL WORK ASSESSMENT / PLAN Will continue to assess substance abuse history and work with family to develop plan for post-acute placement of patient. Patient unable to complete SBIRT due to difficulties w concentration and current medical condition.   Assessment/plan status:  Psychosocial Support/Ongoing Assessment of Needs Other assessment/ plan:   Information/referral to community resources:   [facility] DSS for [employer] application, discussed process of initiating application for disability w brother.    PATIENT'S/FAMILY'S RESPONSE TO PLAN OF CARE: Brother is cooperating w completing paperwork for Medicaid and disability applications  Sallee Lange, LCSW Clinical Social Worker.

## 2011-12-20 NOTE — Clinical Social Work Note (Signed)
Bed offer received from [facility] in [place], [place].  Faxed FL2, H and P, Face Sheet to facility via [facility] as requested as they were not on original list.  Roberto [name] to contact admissions coordinator on LOG.  Also received tentative bed offer from [facility] in [place].  Roberto [name] requested Nurses Notes, and upon review of these documents, wanted to see notes from today before deciding whether to accept patient tomorrow. Left message for brother Roberto [name] to discuss bed offers, asked him to return call.    Roberto Genera, LCSW Clinical Social Worker [phone])

## 2011-12-20 NOTE — Progress Notes (Signed)
Nutrition Follow-up  Diet Order:  Dysphagia 2.   Per RN in progression, pt family reports mentation is better. Variable PO intake; PO: 0-75% with decline. Declining PO intake likely partly due to somnolence. RN reported that pt often refuses to eat when woken up. Pt was given a dose of Haldol this AM. Pt continues to receive Prostat TID and Ensure BID.  Per MD note, pt medically stable for discharge to SNF. Case Management and Social Work working on placement, but challenging due to pt's lack of financial resources.   Meds: Scheduled Meds:   . ALPRAZolam  0.5 mg Oral TID  . antiseptic oral rinse  15 mL Mouth Rinse q12n4p  . ciprofloxacin  500 mg Oral BID  . cloNIDine  0.1 mg Oral QHS  . feeding supplement  237 mL Oral BID BM  . feeding supplement  30 mL Oral TID WC  . Fluticasone-Salmeterol  1 puff Inhalation Q12H  . folic acid  1 mg Oral Daily  . lactulose  10 g Oral BID  . levalbuterol  0.63 mg Nebulization Once  . levalbuterol  0.63 mg Nebulization Q6H  . pantoprazole  40 mg Oral BID AC  . QUEtiapine  25 mg Oral BID  . sodium chloride  10-40 mL Intracatheter Q12H  . thiamine  100 mg Oral Daily  . tiotropium  18 mcg Inhalation Daily   Continuous Infusions:  PRN Meds:.haloperidol, haloperidol lactate, haloperidol lactate, levalbuterol, morphine injection, ondansetron (ZOFRAN) IV, ondansetron, sodium chloride  Labs:  CMP     Component Value Date/Time   NA 142 12/18/2011 0541   K 3.9 12/18/2011 0541   CL 114* 12/18/2011 0541   CO2 22 12/18/2011 0541   GLUCOSE 94 12/18/2011 0541   BUN 12 12/18/2011 0541   CREATININE 0.84 12/18/2011 0541   CALCIUM 6.7* 12/18/2011 0541   PROT 6.2 12/16/2011 0423   ALBUMIN 2.0* 12/16/2011 0423   AST 108* 12/16/2011 0423   ALT 36 12/16/2011 0423   ALKPHOS 103 12/16/2011 0423   BILITOT 4.4* 12/16/2011 0423   GFRNONAA >90 12/18/2011 0541   GFRAA >90 12/18/2011 0541     Intake/Output Summary (Last 24 hours) at 12/20/11 1238 Last data filed at 12/20/11  1049  Gross per 24 hour  Intake      0 ml  Output    150 ml  Net   -150 ml    Weight Status:  Current weight: 239#. Weight loss of -6#, 2.4% x 1 week. This is characteristic of severe weight loss.   Re-estimated needs:  2442-2664 kcals daily, 111-133 grams protein daily, 1 ml per kcal fluid daily  Nutrition Dx:  -Excessive Alcohol intake (NI-4.3) Status: Resolved. New nutrition dx: Inadequate oral intake r/t decreased PO's, somnolence AEB variable PO intake (PO: 0-75% with decline), severe weight loss (2.4% weight loss x 1 week).   Goal:  1) Pt will meet >75% of estimated nutritional needs- goal not met  Intervention:  Increase Ensure to TID.  Monitor: Continue to monitor for diet tolerance, PO intake, skin assessments, labs, and weight trends.   Orlene Plum Pager #:  [phone]

## 2011-12-21 DIAGNOSIS — D62 Acute posthemorrhagic anemia: Secondary | ICD-10-CM

## 2011-12-21 DIAGNOSIS — K7030 Alcoholic cirrhosis of liver without ascites: Secondary | ICD-10-CM

## 2011-12-21 DIAGNOSIS — K260 Acute duodenal ulcer with hemorrhage: Secondary | ICD-10-CM

## 2011-12-21 DIAGNOSIS — E782 Mixed hyperlipidemia: Secondary | ICD-10-CM

## 2011-12-21 LAB — GLUCOSE, CAPILLARY: Glucose-Capillary: 95 mg/dL (ref 70–99)

## 2011-12-21 MED ORDER — ENSURE COMPLETE PO LIQD: 237.0000 mL | Freq: Three times a day (TID) | ORAL | Status: DC

## 2011-12-21 MED ORDER — FOLIC ACID 1 MG PO TABS: 1.0000 mg | ORAL_TABLET | Freq: Every day | ORAL | Status: DC

## 2011-12-21 MED ORDER — CIPROFLOXACIN HCL 500 MG PO TABS: 500.0000 mg | ORAL_TABLET | Freq: Two times a day (BID) | ORAL | Status: AC

## 2011-12-21 MED ORDER — TIOTROPIUM BROMIDE MONOHYDRATE 18 MCG IN CAPS: 18.0000 ug | ORAL_CAPSULE | Freq: Every day | RESPIRATORY_TRACT | Status: DC

## 2011-12-21 MED ORDER — CLONIDINE HCL 0.1 MG PO TABS: 0.1000 mg | ORAL_TABLET | Freq: Every day | ORAL | Status: DC

## 2011-12-21 MED ORDER — PRO-STAT SUGAR FREE PO LIQD: 30.0000 mL | Freq: Three times a day (TID) | ORAL | Status: DC

## 2011-12-21 MED ORDER — BIS SUBCIT-METRONID-TETRACYC 140-125-125 MG PO CAPS: 3.0000 | ORAL_CAPSULE | Freq: Three times a day (TID) | ORAL | Status: DC

## 2011-12-21 MED ORDER — QUETIAPINE FUMARATE 25 MG PO TABS: 25.0000 mg | ORAL_TABLET | Freq: Two times a day (BID) | ORAL | Status: DC

## 2011-12-21 MED ORDER — LACTULOSE 10 GM/15ML PO SOLN: 30.0000 g | Freq: Two times a day (BID) | ORAL | Status: AC

## 2011-12-21 MED ORDER — OMEPRAZOLE 40 MG PO CPDR: 40.0000 mg | DELAYED_RELEASE_CAPSULE | Freq: Two times a day (BID) | ORAL | Status: DC

## 2011-12-21 MED ORDER — ALPRAZOLAM 0.5 MG PO TABS: 0.5000 mg | ORAL_TABLET | Freq: Three times a day (TID) | ORAL | Status: AC

## 2011-12-21 MED ORDER — THIAMINE HCL 100 MG PO TABS: 100.0000 mg | ORAL_TABLET | Freq: Every day | ORAL | Status: DC

## 2011-12-21 NOTE — Discharge Summary (Addendum)
Physician Discharge Summary  Patient ID: Roberto [name] MRN: [id number] DOB/AGE: 03-31-1946 66 y.o.  Admit date: 12/13/2011 Discharge date: 12/21/2011  Discharge Diagnoses:  Principal Problem:  *GI bleed secondary to duodenal ulcer   Acute blood loss anemia Chronic Macrocytic anemia with borderline folate level  Alcohol abuse  Jaundice secondary to alcoholic liver disease  Coagulopathy secondary to alcoholic liver disease  Hepatic encephalopathy  UTI (urinary tract infection), Pseudomonas aeruginosa  Smoker  Debility  Frequent falls  Compression fracture of L1 lumbar vertebra, old  Hypocalcemia  Acute renal failure  Hypernatremia, resolved  Hypotension History of hypertension  Hyperglycemia, without diabetes  Alcoholic liver disease with cirrhosis  Thrombocytopenia  Duodenal ulcer with hemorrhage  Esophageal varices without bleeding  Hx of polysubstance abuse Acute delirium, improved.  Helicobacter pylori antibody positive   Medication List  As of 12/21/2011 10:24 AM   STOP taking these medications         losartan-hydrochlorothiazide 100-25 MG per tablet      olmesartan 40 MG tablet      oxyCODONE-acetaminophen 10-325 MG per tablet         TAKE these medications         ALPRAZolam 0.5 MG tablet   Commonly known as: XANAX   Take 1 tablet (0.5 mg total) by mouth 3 (three) times daily.      bismuth-metronidazole-tetracycline 140-125-125 MG per capsule   Commonly known as: PLYERA   Take 3 capsules by mouth 4 (four) times daily -  before meals and at bedtime. For 10 days      ciprofloxacin 500 MG tablet   Commonly known as: CIPRO   Take 1 tablet (500 mg total) by mouth 2 (two) times daily. For 5 more days then stop      cloNIDine 0.1 MG tablet   Commonly known as: CATAPRES   Take 1 tablet (0.1 mg total) by mouth at bedtime.      feeding supplement Liqd   Take 237 mLs by mouth 3 (three) times daily between meals.      feeding supplement Liqd   Take 30  mLs by mouth 3 (three) times daily with meals.      Fluticasone-Salmeterol 250-50 MCG/DOSE Aepb   Commonly known as: ADVAIR   Inhale 1 puff into the lungs every 12 (twelve) hours.      folic acid 1 MG tablet   Commonly known as: FOLVITE   Take 1 tablet (1 mg total) by mouth daily.      lactulose 10 GM/15ML solution   Commonly known as: CHRONULAC   Take 45 mLs (30 g total) by mouth 2 (two) times daily.      omeprazole 40 MG capsule   Commonly known as: PRILOSEC   Take 1 capsule (40 mg total) by mouth 2 (two) times daily. Acid Reflux      QUEtiapine 25 MG tablet   Commonly known as: SEROQUEL   Take 1 tablet (25 mg total) by mouth 2 (two) times daily.      thiamine 100 MG tablet   Take 1 tablet (100 mg total) by mouth daily.      tiotropium 18 MCG inhalation capsule   Commonly known as: SPIRIVA   Place 1 capsule (18 mcg total) into inhaler and inhale daily.            Discharge Orders    Future Orders Please Complete By Expires   Diet - low sodium heart healthy      Walk  with assistance         Follow-up Information    Follow up with nursing home provider in 1 week.        Disposition: SNF  Discharged Condition: stable  Consults: Treatment Team:  Malissa Hippo, MD Randall An, MD  Labs:   Sample type    ARTERIAL           FIO2    21.00           pH, Arterial    7.499           pCO2 arterial    37.3           pO2, Arterial    95.9           Bicarbonate    28.8           TCO2    27.9           Acid-Base Excess    5.4           O2 Saturation    99.5           Patient temperature    37.0           Collection site    RIGHT RADIAL           Allens test (pass/fail)    PASS           CHEM PROFILE   Sodium    147 146 150 144 142       Potassium    4.1 3.4 3.5 3.9 3.9       Chloride    110 114 117 114 114       CO2    22 23 24 23 22        Mean Plasma Glucose     91          BUN    42 32 21 15 12        Creatinine, Ser    1.50 1.22 1.14 1.03 0.84         Calcium    6.8 6.9 7.1 6.7 6.7       GFR calc non Af Amer    48 61 67 75 >90       GFR calc Af Amer    55 71 77 87 >90       Glucose, Bld    202 164 145 146 94       Alkaline Phosphatase    120 95 103         Albumin    1.9 1.9 2.0         AST    124 110 108         ALT    39 35 36         Total Protein    5.6 5.9 6.2         Ammonia    145  47         Bilirubin, Direct    3.1           Indirect Bilirubin    1.6           Total Bilirubin    5.6 4.3 4.4         CARDIAC PROFILE   CK, MB    1.4           Total CK    41  Troponin I    <0.30           Pro B Natriuretic peptide (BNP)    679.8 3592.0          IRON /ANEMIA PROFILE   Iron    105           UIBC    <15           TIBC    NOT CALC           Saturation Ratios    NOT CALC           Ferritin    1582           Folate    5.4           OTHER CHEM   Lactic Acid, Venous    2.1           Procalcitonin    1.12           Vitamin B-12    1590           CBC   WBC    23.8  10.0 8.2 8.5 10.4 7.9     RBC    1.72  2.66 2.39 2.49 2.76 2.50     Hemoglobin    6.1 8.4 9.3 8.3 8.7 9.6 8.8     HCT    19.4 24.7 27.6 25.7 26.2 28.9 26.5     MCV    112.8  103.8 107.5 105.2 104.7 106.0     MCH    35.5  35.0 34.7 34.9 34.8 35.2     MCHC    31.4  33.7 32.3 33.2 33.2 33.2     RDW    19.9  28.2 27.3 26.3 25.1      Platelets    158  115 95 95 90 99 95     DIFFERENTIAL   Neutrophils Relative    68      65     Lymphocytes Relative    23      21     Monocytes Relative    7      10     Eosinophils Relative    1      3     Basophils Relative    1      0     Neutro Abs    9.8      5.1     Lymphs Abs    3.3      1.7     Monocytes Absolute    1.0      0.8     Eosinophils Absolute    0.1      0.2     Basophils Absolute    0.1      0.0     Smear Review       SCHISTOCYTES NOTED ON SMEAR        RBC.    1.36           Retic Ct Pct    11.5           Retic Count, Manual    156.4           COAG OTHER   D-Dimer, Quant       6.55   2.37      Fibrinogen       170        PROTIME W/ INR   Prothrombin Time  19.7 19.6  21.7 21.7   19.4     INR    1.64 1.63  1.85 1.85   1.61     PTT   aPTT    33   39   39     DIABETES   Hemoglobin A1C     4.8          Glucose, Bld    202 164 145 146 94       THYROID   TSH    1.483            HEPATITIS A   Hep A IgM    NEGATIVE   NEGATIVE        HEPATITIS B   Hepatitis B Surface Ag    NEGATIVE   NEGATIVE        Hep B C IgM    NEGATIVE   NEGATIVE        HEPATITIS C   HCV Ab    NEGATIVE   NEGATIVE        ID ANTIBODIES   H Pylori IgG      1.17         URINALYSIS   Color, Urine    AMBER           APPearance    CLOUDY           Specific Gravity, Urine    1.010           pH    6.0           Glucose, UA    NEGATIVE           Bilirubin Urine    MODERATE           Ketones, ur    NEGATIVE           Protein, ur    30           Urobilinogen, UA    4.0           Nitrite    POSITIVE           Leukocytes, UA    SMALL           Hgb urine dipstick    LARGE           WBC, UA    7-10           RBC / HPF    TOO NUMEROUS TO COUNT           Squamous Epithelial / LPF    RARE           Bacteria, UA    MANY           TOX, BLOOD   Alcohol, Ethyl (B)    <11           TOX, URINE   Amphetamines    NONE DETECTED           Barbiturates    NONE DETECTED           Benzodiazepines    POSITIVE           Opiates    NONE DETECTED           Cocaine    NONE DETECTED           Tetrahydrocannabinol    NONE DETECTED            Diagnostics:  Ct Abdomen Pelvis Wo Contrast  12/14/2011  *RADIOLOGY REPORT*  Clinical Data: Back pain, swelling, bruising, and bleeding.  CT ABDOMEN AND PELVIS WITHOUT CONTRAST  Technique:  Multidetector CT imaging of the abdomen and pelvis was performed following the standard protocol without intravenous contrast.  Comparison: None.  Findings: Consolidation or atelectasis in both lung bases. Pneumonia may be present.  Respiratory motion artifact limits visualization.  Diffuse low attenuation  change throughout the liver with enlargement consistent with fatty infiltration.  Spleen size is normal.  It is difficult to resolve, there appears to be fluid around the liver edge as well as around the spleen.  Fluid tracks along the right pericolic gutter into the pelvis.  Low Hounsfield unit measurements of the fluid suggesting ascites.  The no obvious hemorrhage.  The gallbladder is somewhat hyperdense which may be due to sludge.  No gallbladder wall thickening.  Pancreas and adrenal glands are unremarkable.  No hydronephrosis or stone in either kidney.  Calcification of the abdominal aorta without aneurysm.  Prominent visceral adipose tissues.  No free air in the abdomen.  The stomach, small bowel, and colon are decompressed.  Pelvis:  Foley catheter decompresses the bladder.  Bladder wall thickening is not excluded.  No significant pelvic lymphadenopathy. The appendix is not visualized.  Pelvic phleboliths.  No evidence of diverticulitis.  Fat in the inguinal canals.  Degenerative changes in the lumbar spine with anterior wedging of T12 and L1 vertebra.  There is associated sclerosis and hypertrophic changes suggesting that these are likely chronic.  No paraspinal soft tissue hematomas.  IMPRESSION: Consolidation or atelectasis in both lung bases.  Diffuse fatty infiltration of the liver.  Increased density in the gallbladder suggesting sludge.  Mild abdominal and pelvic ascites.  Compression fractures of T12 and L1 which appear chronic.  Foley catheter in the bladder.  Original Report Authenticated By: Marlon Pel, M.D.   Dg Abd 1 View  12/17/2011  *RADIOLOGY REPORT*  Clinical Data: Distended abdomen  ABDOMEN - 1 VIEW  Comparison: 12/15/2011  Findings: Gas filled right colon and transverse colon are similar to the prior study.  Small bowel is not dilated.  The study is limited by large patient size as well as suboptimal positioning the patient.  The upper abdomen and left abdomen are not included  in the study.  IMPRESSION: Colonic dilatation, similar to the prior study.  Most likely this is ileus.  Original Report Authenticated By: Camelia Phenes, M.D.   Ct Head Wo Contrast  12/13/2011  *RADIOLOGY REPORT*  Clinical Data: Altered mental status.  Weakness, confusion.  CT HEAD WITHOUT CONTRAST  Technique:  Contiguous axial images were obtained from the base of the skull through the vertex without contrast.  Comparison: 08/17/2011  Findings: Mild generalized atrophy.  No change. No acute intracranial abnormality.  Specifically, no hemorrhage, hydrocephalus, mass lesion, acute infarction, or significant intracranial injury.  No acute calvarial abnormality. Visualized paranasal sinuses and mastoids clear.  Orbital soft tissues unremarkable.  IMPRESSION: No acute intracranial abnormality.  Original Report Authenticated By: Cyndie Chime, M.D.   US Abdomen Complete  12/15/2011  *RADIOLOGY REPORT*  Clinical Data:  Ascites.  GI bleed.  COMPLETE ABDOMINAL ULTRASOUND  Comparison:  12/15/2011.  Findings:  Gallbladder:  No sonographic Murphy's sign.  Suboptimal visualization of the gallbladder.  The patient was combative and the study is technically degraded.  Common bile duct:  Not visualized.  Liver:  Nodular liver, compatible with micronodular cirrhosis. Heterogeneous echotexture.  No focal mass lesions.  IVC:  Not visualized.  Pancreas:  Not  visualized.  Spleen:  8.3 cm.  Normal echotexture.  Right Kidney:  Not seen due to bowel gas and patient noncompliance.  Left Kidney:  11.8 cm.  No hydronephrosis.  Abdominal aorta:  Not visualized.  IMPRESSION:  1.  Technically suboptimal study due to both patient combativeness and overlying bowel gas. 2.  Small volume of ascites. 3.  Echogenic liver with micronodular contour compatible with hepatic cirrhosis.  Original Report Authenticated By: Andreas Newport, M.D.   Dg Chest Port 1 View  12/15/2011  *RADIOLOGY REPORT*  Clinical Data: 66 year old male with abnormal  pulmonary auscultation.  Chronic obstructive pulmonary disease.  PORTABLE CHEST - 1 VIEW  Comparison: 12/14/2011 and earlier.  Findings: Portable upright AP view at 0915 hours.  Interval mildly improved lung bases and bibasilar ventilation.  Residual mild patchy opacity on the right.  No pneumothorax, pulmonary edema, pleural effusion or consolidation.  Stable cardiac size and mediastinal contours.  Right PICC line remains in place.  IMPRESSION: Interval improved bibasilar ventilation with mild residual patchy opacity.  Original Report Authenticated By: Harley Hallmark, M.D.   Dg Chest Port 1 View  12/14/2011  *RADIOLOGY REPORT*  Clinical Data: Right PICC line placement  PORTABLE CHEST - 1 VIEW  Comparison: Portable exam 1025 hours compared to 12/13/2011  Findings: Right arm PICC line, tip projecting over SVC near cavoatrial junction. Enlargement of cardiac silhouette. Tortuous aorta. Pulmonary vascular congestion. Bibasilar atelectasis greater on the right. Upper lungs clear.  IMPRESSION: Bibasilar atelectasis. Tip of right arm PICC line projects over SVC near cavoatrial junction.  Original Report Authenticated By: Lollie Marrow, M.D.   Dg Chest Portable 1 View  12/13/2011  *RADIOLOGY REPORT*  Clinical Data: Altered mental status, hypotension  PORTABLE CHEST - 1 VIEW  Comparison: Portable chest x-ray of 08/17/2011  Findings: The lungs are clear.  Minimal right basilar linear atelectasis is present.  Cardiomegaly is stable.  No bony abnormality is seen.  IMPRESSION: Stable cardiomegaly.  Mild right basilar linear atelectasis.  Original Report Authenticated By: Juline Patch, M.D.   Dg Chest Port 1v Same Day  12/13/2011  *RADIOLOGY REPORT*  Clinical Data: Shortness of breath, hypoxia.  PORTABLE CHEST - 1 VIEW SAME DAY  Comparison: 12/13/2011.  Findings: Trachea is midline.  Heart size stable.  Lungs are somewhat low in volume with atelectasis at the right lung base. Underlying scarring is seen as well.   Minimal left basilar atelectasis.  No definite pleural fluid.  IMPRESSION: Bibasilar atelectasis, right greater than left.  Original Report Authenticated By: Reyes Ivan, M.D.   Dg Abd Portable 1v  12/15/2011  *RADIOLOGY REPORT*  Clinical Data: 66 year old male with abdominal pain.  PORTABLE ABDOMEN - 1 VIEW  Comparison: CT abdomen pelvis 12/14/2011.  Findings: Supine AP portable view 0914 hours. Nonobstructed bowel gas pattern. No acute osseous abnormality identified.  No definite pneumoperitoneum on the supine view.  IMPRESSION: Nonobstructed bowel gas pattern.  Original Report Authenticated By: Harley Hallmark, M.D.    Procedures: EGD on 12/14/2001 by Dr. Dionicia Abler, which showed grade 1 esophageal varices without signs of bleeding. Large duodenal ulcer maximal diameter of 2 cm without stigmata of bleed, but felt to be the likely etiology of bleed.   EKG: Sinus rhythm with short PR Low voltage QRS Nonspecific ST abnormality  Full Code   Hospital Course: See H&P for complete admission details. Mr. Lever is a 66 year old white male with a history of polysubstance abuse, including alcohol, Xanax, opiate, and previous cocaine use. Family  called EMS because of worsening confusion and weakness. He had fallen multiple times a day at home prior to admission. He takes his benzodiazepines and Percocet as and properly, taking multiple pills at a time. He drinks more than a pint of liquor a day. Previously, he had been maintained on methadone, but "quit cold [place]". At that time, his alcohol use reportedly worsened. In the ER, patient had borderline low blood pressures, hypoxia, dry mucous membranes, abrasion to his left flank area, dark red blood on rectal exam and somnolent/confused mental status. His hemoglobin was found to be 5.3. Ammonia level CXLV. Total bilirubin 4.2. INR was 1.74. White blood cell count 14,000. Urinalysis consistent with urinary tract infection. Chest x-ray showed clear lungs. Patient  was admitted to the ICU, given transfusion of packed red blood cells, fluid resuscitation, antibiotics, proton pump inhibitor. Because of his heavy alcohol history, and abnormal coagulation, increased LFTs, he was also started on an octreotide drip empirically. GI was consulted. Patient was given bronchodilators for wheezing. His pulmonary status improved and he was able to undergo EGD eventually. It showed nonbleeding esophageal varices, and a large duodenal ulcer. Urine culture grew out Pseudomonas and his antibiotics were adjusted. The Pseudomonas was pansensitive. Patient had periods of confusion. He was kept on thiamine folate and multivitamins as well as benzodiazepines. His bleeding stabilized. Serology for H. pylori he was elevated and he will need to be treated as an outpatient. His confusion has improved. He was started on lactulose for the hepatic encephalopathy. He has worked with physical therapy and would benefit from short-term skilled nursing facility. His other is currently applying for Medicaid disability. Discharge time greater than 30 minutes  Discharge Exam:  Blood pressure 118/79, pulse 101, temperature 98.1 F (36.7 C), temperature source Oral, resp. rate 20, height 6\' 1"  (1.854 m), weight 108.5 kg (239 lb 3.2 oz), SpO2 96.00%. General: Comfortable. Sitting in a chair. More lucid and appropriate. Lungs clear to auscultation bilaterally without wheeze rhonchi or rales Cardiovascular regular rate rhythm without murmurs gas rubs Abdomen soft nontender nondistended Extremities 1+ edema   Signed: Carmin Alvidrez L 12/21/2011, 10:24 AM

## 2011-12-21 NOTE — Clinical Social Work Note (Signed)
Contacted [employer] and informed him of bed offer at [facility].  Family agreeable.  Family wants EMS to transport - gave family contact info for [facility] and asked that they make payment arrangements for the transport if desired.  Family given address of [facility], and informed that they will need to be there to sign patient into facility.  Zack Brooks, Asst Dir of SW, given information to complete LOG process.  Spoke w [facility] at [facility] [phone]) and facility is ready for patient.   [name] Clinical Social Worker [phone])

## 2011-12-21 NOTE — Plan of Care (Signed)
Problem: Phase III Progression Outcomes Goal: IV/normal saline lock discontinued Outcome: Completed/Met Date Met:  12/21/11 IV removed by pt  Problem: Discharge Progression Outcomes Goal: Other Discharge Outcomes/Goals Outcome: Completed/Met Date Met:  12/21/11 Report called to [facility] center  I Spoke to [facility] LPN and gave complete report  Pt transported by [employer] staff to facility

## 2012-01-03 ENCOUNTER — Other Ambulatory Visit (HOSPITAL_BASED_OUTPATIENT_CLINIC_OR_DEPARTMENT_OTHER): Payer: Self-pay | Admitting: Internal Medicine

## 2012-01-03 DIAGNOSIS — R9389 Abnormal findings on diagnostic imaging of other specified body structures: Secondary | ICD-10-CM

## 2012-01-08 ENCOUNTER — Ambulatory Visit (HOSPITAL_COMMUNITY)
Admission: RE | Admit: 2012-01-08 | Discharge: 2012-01-08 | Disposition: A | Discharge status: Home / Self Care | Payer: BC Managed Care – PPO | Source: Ambulatory Visit | Attending: Internal Medicine | Admitting: Internal Medicine

## 2012-01-08 DIAGNOSIS — S2249XA Multiple fractures of ribs, unspecified side, initial encounter for closed fracture: Secondary | ICD-10-CM | POA: Insufficient documentation

## 2012-01-08 DIAGNOSIS — X58XXXA Exposure to other specified factors, initial encounter: Secondary | ICD-10-CM | POA: Insufficient documentation

## 2012-01-08 DIAGNOSIS — R9389 Abnormal findings on diagnostic imaging of other specified body structures: Secondary | ICD-10-CM

## 2012-01-08 DIAGNOSIS — J984 Other disorders of lung: Secondary | ICD-10-CM | POA: Insufficient documentation

## 2012-01-08 DIAGNOSIS — I2510 Atherosclerotic heart disease of native coronary artery without angina pectoris: Secondary | ICD-10-CM | POA: Insufficient documentation

## 2012-01-08 MED ORDER — IOHEXOL 300 MG/ML  SOLN
80.0000 mL | Freq: Once | INTRAMUSCULAR | Status: AC | PRN
  Administered 2012-01-08: 80 mL via INTRAVENOUS

## 2012-01-19 ENCOUNTER — Other Ambulatory Visit (HOSPITAL_COMMUNITY): Payer: Self-pay | Admitting: Family Medicine

## 2012-01-19 DIAGNOSIS — R188 Other ascites: Secondary | ICD-10-CM

## 2012-01-23 ENCOUNTER — Ambulatory Visit (HOSPITAL_COMMUNITY)
Admission: RE | Admit: 2012-01-23 | Discharge: 2012-01-23 | Disposition: A | Discharge status: Home / Self Care | Payer: Medicaid Other | Source: Ambulatory Visit | Attending: Family Medicine | Admitting: Family Medicine

## 2012-01-23 ENCOUNTER — Other Ambulatory Visit (HOSPITAL_COMMUNITY): Payer: Self-pay | Admitting: Family Medicine

## 2012-01-23 DIAGNOSIS — K7460 Unspecified cirrhosis of liver: Secondary | ICD-10-CM | POA: Insufficient documentation

## 2012-01-23 DIAGNOSIS — R188 Other ascites: Secondary | ICD-10-CM | POA: Insufficient documentation

## 2012-01-23 DIAGNOSIS — R635 Abnormal weight gain: Secondary | ICD-10-CM | POA: Insufficient documentation

## 2012-01-31 ENCOUNTER — Encounter (INDEPENDENT_AMBULATORY_CARE_PROVIDER_SITE_OTHER): Payer: Self-pay | Admitting: *Deleted

## 2012-02-05 ENCOUNTER — Ambulatory Visit (INDEPENDENT_AMBULATORY_CARE_PROVIDER_SITE_OTHER): Payer: Self-pay | Admitting: Internal Medicine

## 2012-02-05 ENCOUNTER — Encounter (INDEPENDENT_AMBULATORY_CARE_PROVIDER_SITE_OTHER): Payer: Self-pay | Admitting: Internal Medicine

## 2012-02-05 VITALS — BP 124/86 | HR 72 | Temp 98.5°F | Ht 73.0 in | Wt 229.0 lb

## 2012-02-05 DIAGNOSIS — K7460 Unspecified cirrhosis of liver: Secondary | ICD-10-CM
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
## 2012-02-05 NOTE — Patient Instructions (Addendum)
OV in 2 months. No etoh.

## 2012-02-06 LAB — COMPREHENSIVE METABOLIC PANEL
Alkaline Phosphatase: 109 U/L (ref 39–117)
Creat: 0.83 mg/dL (ref 0.50–1.35)
Glucose, Bld: 86 mg/dL (ref 70–99)
Sodium: 136 mEq/L (ref 135–145)
Total Bilirubin: 1.2 mg/dL (ref 0.3–1.2)
Total Protein: 7.5 g/dL (ref 6.0–8.3)

## 2012-02-06 LAB — CBC WITH DIFFERENTIAL/PLATELET
Eosinophils Absolute: 0.3 10*3/uL (ref 0.0–0.7)
Eosinophils Relative: 3 % (ref 0–5)
HCT: 35.7 % — ABNORMAL LOW (ref 39.0–52.0)
Hemoglobin: 12.2 g/dL — ABNORMAL LOW (ref 13.0–17.0)
Lymphocytes Relative: 23 % (ref 12–46)
Lymphs Abs: 2.6 10*3/uL (ref 0.7–4.0)
MCH: 35.3 pg — ABNORMAL HIGH (ref 26.0–34.0)
MCV: 103.2 fL — ABNORMAL HIGH (ref 78.0–100.0)
Monocytes Absolute: 1 10*3/uL (ref 0.1–1.0)
Monocytes Relative: 9 % (ref 3–12)
RBC: 3.46 MIL/uL — ABNORMAL LOW (ref 4.22–5.81)

## 2012-02-06 LAB — AMMONIA: Ammonia: 51 umol/L (ref 11–60)

## 2012-04-08 ENCOUNTER — Ambulatory Visit (INDEPENDENT_AMBULATORY_CARE_PROVIDER_SITE_OTHER): Payer: Self-pay | Admitting: Internal Medicine

## 2012-04-09 ENCOUNTER — Ambulatory Visit (INDEPENDENT_AMBULATORY_CARE_PROVIDER_SITE_OTHER): Payer: Medicaid Other | Admitting: Internal Medicine

## 2012-04-09 ENCOUNTER — Encounter (INDEPENDENT_AMBULATORY_CARE_PROVIDER_SITE_OTHER): Payer: Self-pay | Admitting: Internal Medicine

## 2012-04-09 VITALS — BP 120/72 | HR 76 | Temp 98.8°F | Ht 73.0 in | Wt 226.8 lb

## 2012-04-09 DIAGNOSIS — R188 Other ascites: Secondary | ICD-10-CM

## 2012-04-09 DIAGNOSIS — K7030 Alcoholic cirrhosis of liver without ascites: Secondary | ICD-10-CM

## 2012-04-09 LAB — AMMONIA: Ammonia: 30 umol/L (ref 11–60)

## 2012-04-09 LAB — COMPREHENSIVE METABOLIC PANEL
ALT: 15 U/L (ref 0–53)
Albumin: 2.4 g/dL — ABNORMAL LOW (ref 3.5–5.2)
CO2: 30 mEq/L (ref 19–32)
Glucose, Bld: 100 mg/dL — ABNORMAL HIGH (ref 70–99)
Potassium: 4.1 mEq/L (ref 3.5–5.3)
Sodium: 135 mEq/L (ref 135–145)
Total Protein: 7.9 g/dL (ref 6.0–8.3)

## 2012-04-09 MED ORDER — FUROSEMIDE 20 MG PO TABS: 40.0000 mg | ORAL_TABLET | Freq: Every day | ORAL | Status: DC
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
## 2012-04-09 NOTE — Patient Instructions (Signed)
OV in 2 months.  Continue present medications. Spironolactone 50mg  BID. Lasix 40mg  daily

## 2012-04-10 LAB — AFP TUMOR MARKER: AFP-Tumor Marker: 4 ng/mL (ref 0.0–8.0)

## 2012-04-16 ENCOUNTER — Ambulatory Visit (HOSPITAL_COMMUNITY)
Admission: RE | Admit: 2012-04-16 | Discharge: 2012-04-16 | Disposition: A | Discharge status: Home / Self Care | Payer: Medicaid Other | Source: Ambulatory Visit | Attending: Family Medicine | Admitting: Family Medicine

## 2012-04-16 ENCOUNTER — Other Ambulatory Visit (HOSPITAL_COMMUNITY): Payer: Self-pay | Admitting: Family Medicine

## 2012-04-16 DIAGNOSIS — J209 Acute bronchitis, unspecified: Secondary | ICD-10-CM

## 2012-04-16 DIAGNOSIS — R059 Cough, unspecified: Secondary | ICD-10-CM | POA: Insufficient documentation

## 2012-04-16 DIAGNOSIS — I10 Essential (primary) hypertension: Secondary | ICD-10-CM | POA: Insufficient documentation

## 2012-04-16 DIAGNOSIS — R05 Cough: Secondary | ICD-10-CM | POA: Insufficient documentation

## 2012-04-19 ENCOUNTER — Other Ambulatory Visit (INDEPENDENT_AMBULATORY_CARE_PROVIDER_SITE_OTHER): Payer: Self-pay | Admitting: Internal Medicine

## 2012-04-24 ENCOUNTER — Other Ambulatory Visit (INDEPENDENT_AMBULATORY_CARE_PROVIDER_SITE_OTHER): Payer: Self-pay | Admitting: Internal Medicine

## 2012-04-24 ENCOUNTER — Encounter (HOSPITAL_COMMUNITY): Payer: Self-pay

## 2012-04-24 ENCOUNTER — Emergency Department (HOSPITAL_COMMUNITY)
Admission: EM | Admit: 2012-04-24 | Discharge: 2012-04-24 | Disposition: A | Discharge status: Home / Self Care | Payer: Medicaid Other | Attending: Emergency Medicine | Admitting: Emergency Medicine

## 2012-04-24 ENCOUNTER — Emergency Department (HOSPITAL_COMMUNITY): Payer: Medicaid Other

## 2012-04-24 DIAGNOSIS — I10 Essential (primary) hypertension: Secondary | ICD-10-CM | POA: Insufficient documentation

## 2012-04-24 DIAGNOSIS — K625 Hemorrhage of anus and rectum: Secondary | ICD-10-CM | POA: Insufficient documentation

## 2012-04-24 DIAGNOSIS — K644 Residual hemorrhoidal skin tags: Secondary | ICD-10-CM | POA: Insufficient documentation

## 2012-04-24 DIAGNOSIS — I2510 Atherosclerotic heart disease of native coronary artery without angina pectoris: Secondary | ICD-10-CM | POA: Insufficient documentation

## 2012-04-24 DIAGNOSIS — K649 Unspecified hemorrhoids: Secondary | ICD-10-CM

## 2012-04-24 DIAGNOSIS — J4489 Other specified chronic obstructive pulmonary disease: Secondary | ICD-10-CM | POA: Insufficient documentation

## 2012-04-24 DIAGNOSIS — J449 Chronic obstructive pulmonary disease, unspecified: Secondary | ICD-10-CM | POA: Insufficient documentation

## 2012-04-24 DIAGNOSIS — Z9861 Coronary angioplasty status: Secondary | ICD-10-CM | POA: Insufficient documentation

## 2012-04-24 DIAGNOSIS — F17200 Nicotine dependence, unspecified, uncomplicated: Secondary | ICD-10-CM | POA: Insufficient documentation

## 2012-04-24 DIAGNOSIS — K5900 Constipation, unspecified: Secondary | ICD-10-CM | POA: Insufficient documentation

## 2012-04-24 LAB — COMPREHENSIVE METABOLIC PANEL
ALT: 13 U/L (ref 0–53)
AST: 59 U/L — ABNORMAL HIGH (ref 0–37)
Albumin: 2.1 g/dL — ABNORMAL LOW (ref 3.5–5.2)
Alkaline Phosphatase: 109 U/L (ref 39–117)
BUN: 6 mg/dL (ref 6–23)
CO2: 28 mEq/L (ref 19–32)
Calcium: 8.7 mg/dL (ref 8.4–10.5)
Chloride: 103 mEq/L (ref 96–112)
Creatinine, Ser: 0.76 mg/dL (ref 0.50–1.35)
GFR calc Af Amer: >90 mL/min (ref >90)
GFR calc non Af Amer: >90 mL/min (ref >90)
Glucose, Bld: 99 mg/dL (ref 70–99)
Potassium: 3.6 mEq/L (ref 3.5–5.1)
Sodium: 139 mEq/L (ref 135–145)
Total Bilirubin: 0.9 mg/dL (ref 0.3–1.2)
Total Protein: 8 g/dL (ref 6.0–8.3)

## 2012-04-24 LAB — CBC WITH DIFFERENTIAL/PLATELET
Basophils Absolute: 0.1 10*3/uL (ref 0.0–0.1)
Basophils Relative: 1 % (ref 0–1)
Eosinophils Absolute: 0.1 10*3/uL (ref 0.0–0.7)
Eosinophils Relative: 1 % (ref 0–5)
HCT: 32.9 % — ABNORMAL LOW (ref 39.0–52.0)
Hemoglobin: 11.7 g/dL — ABNORMAL LOW (ref 13.0–17.0)
Lymphocytes Relative: 27 % (ref 12–46)
Lymphs Abs: 1.8 10*3/uL (ref 0.7–4.0)
MCH: 35.5 pg — ABNORMAL HIGH (ref 26.0–34.0)
MCHC: 35.6 g/dL (ref 30.0–36.0)
MCV: 99.7 fL (ref 78.0–100.0)
Monocytes Absolute: 0.3 10*3/uL (ref 0.1–1.0)
Monocytes Relative: 5 % (ref 3–12)
Neutro Abs: 4.4 10*3/uL (ref 1.7–7.7)
Neutrophils Relative %: 66 % (ref 43–77)
Platelets: 216 10*3/uL (ref 150–400)
RBC: 3.3 MIL/uL — ABNORMAL LOW (ref 4.22–5.81)
RDW: 17.9 % — ABNORMAL HIGH (ref 11.5–15.5)
WBC: 6.6 10*3/uL (ref 4.0–10.5)

## 2012-04-24 MED ORDER — DOCUSATE SODIUM 100 MG PO CAPS: 100.0000 mg | ORAL_CAPSULE | Freq: Two times a day (BID) | ORAL | Status: DC

## 2012-04-24 NOTE — ED Provider Notes (Signed)
History     CSN: [id number]  Arrival date & time 04/24/12  3474   First MD Initiated Contact with Patient 04/24/12 [phone]      Chief Complaint  Patient presents with  . Rectal Bleeding    (Consider location/radiation/quality/duration/timing/severity/associated sxs/prior treatment) HPI Comments: Had 4 BM's last PM and "strained really hard" and noted bright red blood each time.  Same thing occurred this AM.  Patient is a 66 y.o. male presenting with hematochezia. The history is provided by the patient. No language interpreter was used.  Rectal Bleeding  Episode onset: yest PM. The problem has been unchanged. The patient is experiencing no pain. The stool is described as hard and bloody. Associated symptoms include hemorrhoids and rectal pain. Pertinent negatives include no fever, no abdominal pain, no diarrhea, no hematemesis, no nausea and no vomiting. Urine output has been normal. There were no sick contacts. He has received no recent medical care.    Past Medical History  Diagnosis Date  . COPD (chronic obstructive pulmonary disease)   . Hypertension   . Hepatic steatosis 12/14/2011  . Compression fracture of L1 lumbar vertebra 12/14/2011  . C2 cervical fracture   . Duodenal ulcer with hemorrhage 12/15/2011    Likely source of bleeding.per EGD; Dr. Karilyn Cota  . Esophageal varices without bleeding 12/15/2011    per EGD  . Cirrhosis 12/16/2011    per ultrasound  . Hx of substance abuse 12/17/2011  . 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 Hepatic encephalopathy   . Helicobacter pylori antibody positive 12/19/2011    Past Surgical History  Procedure Date  . Coronary angioplasty with stent placement     No family history on file.  History  Substance Use Topics  . Smoking status: Current Every Day Smoker -- 1.0 packs/day  . Smokeless tobacco: Never Used   Comment: 1 1/2 pack a day  . Alcohol Use: Yes     3 times a week. Quit drinking 45 days  ago.  He was drinking a 5th a day, and before that he was drinking a twelve pack a day.      Review of Systems  Constitutional: Positive for fatigue. Negative for fever, chills and diaphoresis.  Gastrointestinal: Positive for constipation, blood in stool, hematochezia, rectal pain and hemorrhoids. Negative for nausea, vomiting, abdominal pain, diarrhea and hematemesis.  Hematological: Does not bruise/bleed easily.  All other systems reviewed and are negative.    Allergies  Review of patient's allergies indicates no known allergies.  Home Medications   Current Outpatient Rx  Name Route Sig Dispense Refill  . ALPRAZOLAM 1 MG PO TABS Oral Take 0.5 mg by mouth 3 (three) times daily as needed.     Marland Kitchen BIS SUBCIT-METRONID-TETRACYC 140-125-125 MG PO CAPS Oral Take 3 capsules by mouth 4 (four) times daily -  before meals and at bedtime. For 10 days    . CLONIDINE HCL 0.1 MG PO TABS Oral Take 1 tablet (0.1 mg total) by mouth at bedtime. 60 tablet   . DOCUSATE SODIUM 100 MG PO CAPS Oral Take 1 capsule (100 mg total) by mouth every 12 (twelve) hours. 30 capsule 1  . ENSURE COMPLETE PO LIQD Oral Take 237 mLs by mouth 3 (three) times daily between meals.    Marland Kitchen PRO-STAT 64 PO LIQD Oral Take 30 mLs by mouth 3 (three) times daily with meals. 900 mL   . FLUTICASONE-SALMETEROL 250-50 MCG/DOSE IN AEPB Inhalation Inhale 1 puff into the lungs every 12 (twelve)  hours.    Marland Kitchen FOLIC ACID 1 MG PO TABS Oral Take 1 tablet (1 mg total) by mouth daily.    . FUROSEMIDE 20 MG PO TABS Oral Take 2 tablets (40 mg total) by mouth daily. 30 tablet 11  . LACTULOSE 10 GM/15ML PO SOLN  TAKE 3 TABLESPOONFULS TWICE DAILY. 1920 mL 5  . NICOTINE 14 MG/24HR TD PT24 Transdermal Place 1 patch onto the skin daily.    Marland Kitchen OMEPRAZOLE 40 MG PO CPDR Oral Take 1 capsule (40 mg total) by mouth 2 (two) times daily. Acid Reflux    . QUETIAPINE FUMARATE 25 MG PO TABS Oral Take 1 tablet (25 mg total) by mouth 2 (two) times daily.    . QUETIAPINE  FUMARATE 25 MG PO TABS Oral Take 25 mg by mouth 2 (two) times daily.    Marland Kitchen SPIRONOLACTONE 50 MG PO TABS Oral Take 100 mg by mouth daily.     . THIAMINE HCL 100 MG PO TABS Oral Take 1 tablet (100 mg total) by mouth daily.    Marland Kitchen TIOTROPIUM BROMIDE MONOHYDRATE 18 MCG IN CAPS Inhalation Place 1 capsule (18 mcg total) into inhaler and inhale daily. 30 capsule   . TRAMADOL HCL 50 MG PO TABS Oral Take 50 mg by mouth 2 (two) times daily.    Marland Kitchen ZOLPIDEM TARTRATE 5 MG PO TABS Oral Take 5 mg by mouth at bedtime as needed.      BP 128/77  Pulse 92  Temp 97.4 F (36.3 C) (Oral)  Resp 19  SpO2 96%  Physical Exam  Nursing note and vitals reviewed. Constitutional: He is oriented to person, place, and time. He appears well-developed and well-nourished.  HENT:  Head: Normocephalic and atraumatic.  Eyes: EOM are normal.  Neck: Normal range of motion.  Cardiovascular: Normal rate, regular rhythm, normal heart sounds and intact distal pulses.   Pulmonary/Chest: Effort normal and breath sounds normal. No respiratory distress.  Abdominal: Soft. Bowel sounds are normal. He exhibits no distension and no mass. There is no tenderness. There is no rebound and no guarding.       Large external hemorrhoid present.  Brown stool noted around anus.  No fissure seen.  Stool on exam finger appears normal color but is guiac positive.  Musculoskeletal: Normal range of motion.  Neurological: He is alert and oriented to person, place, and time.  Skin: Skin is warm and dry.  Psychiatric: He has a normal mood and affect. Judgment normal.    ED Course  Procedures (including critical care time)  Labs Reviewed  CBC WITH DIFFERENTIAL - Abnormal; Notable for the following:    RBC 3.30 (*)     Hemoglobin 11.7 (*)     HCT 32.9 (*)     MCH 35.5 (*)     RDW 17.9 (*)     All other components within normal limits  COMPREHENSIVE METABOLIC PANEL - Abnormal; Notable for the following:    Albumin 2.1 (*)     AST 59 (*)     All  other components within normal limits   Dg Abd 1 View  04/24/2012  *RADIOLOGY REPORT*  Clinical Data: Rectal bleeding.  ABDOMEN - 1 VIEW  Comparison: 12/17/2011  Findings: There is a nonobstructive bowel gas pattern.  Relative paucity of gas throughout the abdomen.  Moderate to large stool burden throughout the colon.  No supine evidence of free air.  No organomegaly or suspicious calcification.  No acute bony abnormality.  IMPRESSION: No acute findings.  Moderate to large stool burden.   Original Report Authenticated By: Cyndie Chime, M.D.      1. Hemorrhoid   2. Rectal bleeding   3. Constipation       MDM  Avoid straining with BM rx-colace, 30 Increase fluid intake. F/u with PCP       Evalina Field, PA 04/24/12 1016

## 2012-04-24 NOTE — ED Notes (Signed)
Per ems, pt c/o bright red blood with his stools since yesterday.  Pt also reports some difficulty having a BM.  Pt reports some weakness and fatigue as well.

## 2012-04-24 NOTE — ED Provider Notes (Signed)
Medical screening examination/treatment/procedure(s) were performed by non-physician practitioner and as supervising physician I was immediately available for consultation/collaboration.   Benecio Kluger III, MD 04/24/12 2026 

## 2012-04-29 NOTE — Telephone Encounter (Signed)
This prescription as noted was called to [facility].

## 2012-05-14 ENCOUNTER — Encounter (HOSPITAL_COMMUNITY): Payer: Self-pay | Admitting: Emergency Medicine

## 2012-05-14 ENCOUNTER — Emergency Department (HOSPITAL_COMMUNITY): Payer: Medicaid Other

## 2012-05-14 ENCOUNTER — Emergency Department (HOSPITAL_COMMUNITY)
Admission: EM | Admit: 2012-05-14 | Discharge: 2012-05-14 | Disposition: A | Discharge status: Home / Self Care | Payer: Medicaid Other | Attending: Emergency Medicine | Admitting: Emergency Medicine

## 2012-05-14 DIAGNOSIS — I10 Essential (primary) hypertension: Secondary | ICD-10-CM | POA: Insufficient documentation

## 2012-05-14 DIAGNOSIS — J449 Chronic obstructive pulmonary disease, unspecified: Secondary | ICD-10-CM | POA: Insufficient documentation

## 2012-05-14 DIAGNOSIS — N390 Urinary tract infection, site not specified: Secondary | ICD-10-CM | POA: Insufficient documentation

## 2012-05-14 DIAGNOSIS — S2249XA Multiple fractures of ribs, unspecified side, initial encounter for closed fracture: Secondary | ICD-10-CM | POA: Insufficient documentation

## 2012-05-14 DIAGNOSIS — J4489 Other specified chronic obstructive pulmonary disease: Secondary | ICD-10-CM | POA: Insufficient documentation

## 2012-05-14 DIAGNOSIS — Z79899 Other long term (current) drug therapy: Secondary | ICD-10-CM | POA: Insufficient documentation

## 2012-05-14 DIAGNOSIS — S2239XA Fracture of one rib, unspecified side, initial encounter for closed fracture: Secondary | ICD-10-CM

## 2012-05-14 LAB — CBC WITH DIFFERENTIAL/PLATELET
Basophils Absolute: 0.1 10*3/uL (ref 0.0–0.1)
Basophils Relative: 1 % (ref 0–1)
Eosinophils Absolute: 0.3 10*3/uL (ref 0.0–0.7)
Eosinophils Relative: 5 % (ref 0–5)
MCH: 36 pg — ABNORMAL HIGH (ref 26.0–34.0)
MCHC: 34 g/dL (ref 30.0–36.0)
MCV: 106.1 fL — ABNORMAL HIGH (ref 78.0–100.0)
Neutrophils Relative %: 61 % (ref 43–77)
Platelets: 218 10*3/uL (ref 150–400)
RBC: 2.97 MIL/uL — ABNORMAL LOW (ref 4.22–5.81)
RDW: 18.7 % — ABNORMAL HIGH (ref 11.5–15.5)

## 2012-05-14 LAB — URINALYSIS, ROUTINE W REFLEX MICROSCOPIC
Hgb urine dipstick: NEGATIVE
Nitrite: POSITIVE — AB
Protein, ur: NEGATIVE mg/dL
Specific Gravity, Urine: 1.01 (ref 1.005–1.030)
Urobilinogen, UA: 0.2 mg/dL (ref 0.0–1.0)

## 2012-05-14 LAB — BASIC METABOLIC PANEL
Calcium: 8.3 mg/dL — ABNORMAL LOW (ref 8.4–10.5)
GFR calc Af Amer: >90 mL/min (ref >90)
GFR calc non Af Amer: >90 mL/min (ref >90)
Potassium: 3.4 mEq/L — ABNORMAL LOW (ref 3.5–5.1)
Sodium: 138 mEq/L (ref 135–145)

## 2012-05-14 LAB — URINE MICROSCOPIC-ADD ON

## 2012-05-14 MED ORDER — ONDANSETRON HCL 4 MG/2ML IJ SOLN
4.0000 mg | Freq: Once | INTRAMUSCULAR | Status: AC
  Administered 2012-05-14: 4 mg via INTRAVENOUS
  Filled 2012-05-14: qty 2

## 2012-05-14 MED ORDER — CEPHALEXIN 500 MG PO CAPS: 500.0000 mg | ORAL_CAPSULE | Freq: Four times a day (QID) | ORAL | Status: DC

## 2012-05-14 MED ORDER — HYDROMORPHONE HCL PF 1 MG/ML IJ SOLN
1.0000 mg | Freq: Once | INTRAMUSCULAR | Status: AC
  Administered 2012-05-14: 1 mg via INTRAVENOUS
  Filled 2012-05-14: qty 1

## 2012-05-14 MED ORDER — IOHEXOL 300 MG/ML  SOLN
100.0000 mL | Freq: Once | INTRAMUSCULAR | Status: AC | PRN
  Administered 2012-05-14: 100 mL via INTRAVENOUS

## 2012-05-14 MED ORDER — DEXTROSE 5 % IV SOLN
1.0000 g | Freq: Once | INTRAVENOUS | Status: AC
  Administered 2012-05-14: 1 g via INTRAVENOUS
  Filled 2012-05-14: qty 10

## 2012-05-14 MED ORDER — SODIUM CHLORIDE 0.9 % IV BOLUS (SEPSIS)
500.0000 mL | Freq: Once | INTRAVENOUS | Status: AC
  Administered 2012-05-14: 14:00:00 via INTRAVENOUS

## 2012-05-14 MED ORDER — SODIUM CHLORIDE 0.9 % IV SOLN: INTRAVENOUS | Status: DC

## 2012-05-14 MED ORDER — HYDROCODONE-ACETAMINOPHEN 5-325 MG PO TABS: 1.0000 | ORAL_TABLET | Freq: Four times a day (QID) | ORAL | Status: DC | PRN
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
## 2012-05-14 NOTE — ED Notes (Signed)
Pt yelling out for pain medication. I informed pt that the EDP needed to see him before I can administer pain medication.
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
## 2012-05-16 LAB — URINE CULTURE

## 2012-05-17 NOTE — ED Notes (Signed)
+   Urine Patient treated with Keflex-sensitive to same-Chart appended per protocol MD. 

## 2012-05-18 ENCOUNTER — Emergency Department (HOSPITAL_COMMUNITY): Payer: Medicaid Other

## 2012-05-18 ENCOUNTER — Encounter (HOSPITAL_COMMUNITY): Payer: Self-pay | Admitting: Emergency Medicine

## 2012-05-18 ENCOUNTER — Inpatient Hospital Stay (HOSPITAL_COMMUNITY)
Admission: EM | Admit: 2012-05-18 | Discharge: 2012-05-22 | DRG: 871 | Disposition: A | Discharge status: Home / Self Care | Payer: Medicaid Other | Attending: Internal Medicine | Admitting: Internal Medicine

## 2012-05-18 ENCOUNTER — Inpatient Hospital Stay (HOSPITAL_COMMUNITY): Payer: Medicaid Other

## 2012-05-18 DIAGNOSIS — N179 Acute kidney failure, unspecified: Secondary | ICD-10-CM

## 2012-05-18 DIAGNOSIS — K7682 Hepatic encephalopathy: Secondary | ICD-10-CM

## 2012-05-18 DIAGNOSIS — D696 Thrombocytopenia, unspecified: Secondary | ICD-10-CM

## 2012-05-18 DIAGNOSIS — G9340 Encephalopathy, unspecified: Secondary | ICD-10-CM

## 2012-05-18 DIAGNOSIS — R7689 Other specified abnormal immunological findings in serum: Secondary | ICD-10-CM

## 2012-05-18 DIAGNOSIS — F1027 Alcohol dependence with alcohol-induced persisting dementia: Secondary | ICD-10-CM

## 2012-05-18 DIAGNOSIS — D539 Nutritional anemia, unspecified: Secondary | ICD-10-CM

## 2012-05-18 DIAGNOSIS — N390 Urinary tract infection, site not specified: Secondary | ICD-10-CM

## 2012-05-18 DIAGNOSIS — I8500 Esophageal varices without bleeding: Secondary | ICD-10-CM

## 2012-05-18 DIAGNOSIS — K7460 Unspecified cirrhosis of liver: Secondary | ICD-10-CM

## 2012-05-18 DIAGNOSIS — J189 Pneumonia, unspecified organism: Secondary | ICD-10-CM

## 2012-05-18 DIAGNOSIS — R5381 Other malaise: Secondary | ICD-10-CM

## 2012-05-18 DIAGNOSIS — K264 Chronic or unspecified duodenal ulcer with hemorrhage: Secondary | ICD-10-CM

## 2012-05-18 DIAGNOSIS — D689 Coagulation defect, unspecified: Secondary | ICD-10-CM

## 2012-05-18 DIAGNOSIS — F10939 Alcohol use, unspecified with withdrawal, unspecified: Secondary | ICD-10-CM

## 2012-05-18 DIAGNOSIS — R17 Unspecified jaundice: Secondary | ICD-10-CM

## 2012-05-18 DIAGNOSIS — R1084 Generalized abdominal pain: Secondary | ICD-10-CM

## 2012-05-18 DIAGNOSIS — K922 Gastrointestinal hemorrhage, unspecified: Secondary | ICD-10-CM

## 2012-05-18 DIAGNOSIS — K7010 Alcoholic hepatitis without ascites: Secondary | ICD-10-CM

## 2012-05-18 DIAGNOSIS — F10239 Alcohol dependence with withdrawal, unspecified: Secondary | ICD-10-CM | POA: Diagnosis present

## 2012-05-18 DIAGNOSIS — J449 Chronic obstructive pulmonary disease, unspecified: Secondary | ICD-10-CM

## 2012-05-18 DIAGNOSIS — F1010 Alcohol abuse, uncomplicated: Secondary | ICD-10-CM

## 2012-05-18 DIAGNOSIS — S32010A Wedge compression fracture of first lumbar vertebra, initial encounter for closed fracture: Secondary | ICD-10-CM

## 2012-05-18 DIAGNOSIS — R739 Hyperglycemia, unspecified: Secondary | ICD-10-CM

## 2012-05-18 DIAGNOSIS — E43 Unspecified severe protein-calorie malnutrition: Secondary | ICD-10-CM

## 2012-05-18 DIAGNOSIS — F17200 Nicotine dependence, unspecified, uncomplicated: Secondary | ICD-10-CM

## 2012-05-18 DIAGNOSIS — K7290 Hepatic failure, unspecified without coma: Secondary | ICD-10-CM

## 2012-05-18 DIAGNOSIS — N289 Disorder of kidney and ureter, unspecified: Secondary | ICD-10-CM

## 2012-05-18 DIAGNOSIS — E870 Hyperosmolality and hypernatremia: Secondary | ICD-10-CM

## 2012-05-18 DIAGNOSIS — R188 Other ascites: Secondary | ICD-10-CM

## 2012-05-18 DIAGNOSIS — F1911 Other psychoactive substance abuse, in remission: Secondary | ICD-10-CM

## 2012-05-18 DIAGNOSIS — R768 Other specified abnormal immunological findings in serum: Secondary | ICD-10-CM

## 2012-05-18 DIAGNOSIS — E8770 Fluid overload, unspecified: Secondary | ICD-10-CM

## 2012-05-18 DIAGNOSIS — Z9861 Coronary angioplasty status: Secondary | ICD-10-CM

## 2012-05-18 DIAGNOSIS — A419 Sepsis, unspecified organism: Principal | ICD-10-CM

## 2012-05-18 DIAGNOSIS — E876 Hypokalemia: Secondary | ICD-10-CM

## 2012-05-18 DIAGNOSIS — R0600 Dyspnea, unspecified: Secondary | ICD-10-CM

## 2012-05-18 DIAGNOSIS — J4489 Other specified chronic obstructive pulmonary disease: Secondary | ICD-10-CM | POA: Diagnosis present

## 2012-05-18 DIAGNOSIS — K760 Fatty (change of) liver, not elsewhere classified: Secondary | ICD-10-CM

## 2012-05-18 DIAGNOSIS — I10 Essential (primary) hypertension: Secondary | ICD-10-CM

## 2012-05-18 DIAGNOSIS — F1020 Alcohol dependence, uncomplicated: Secondary | ICD-10-CM | POA: Diagnosis present

## 2012-05-18 DIAGNOSIS — D62 Acute posthemorrhagic anemia: Secondary | ICD-10-CM

## 2012-05-18 DIAGNOSIS — R296 Repeated falls: Secondary | ICD-10-CM

## 2012-05-18 DIAGNOSIS — Z6828 Body mass index (BMI) 28.0-28.9, adult: Secondary | ICD-10-CM

## 2012-05-18 DIAGNOSIS — B965 Pseudomonas (aeruginosa) (mallei) (pseudomallei) as the cause of diseases classified elsewhere: Secondary | ICD-10-CM | POA: Diagnosis present

## 2012-05-18 DIAGNOSIS — W19XXXA Unspecified fall, initial encounter: Secondary | ICD-10-CM

## 2012-05-18 DIAGNOSIS — J9600 Acute respiratory failure, unspecified whether with hypoxia or hypercapnia: Secondary | ICD-10-CM

## 2012-05-18 DIAGNOSIS — K560 Paralytic ileus: Secondary | ICD-10-CM

## 2012-05-18 HISTORY — DX: Urinary tract infection, site not specified: N39.0

## 2012-05-18 HISTORY — DX: Other ascites: R18.8

## 2012-05-18 HISTORY — DX: Encephalopathy, unspecified: G93.40

## 2012-05-18 LAB — BLOOD GAS, ARTERIAL
Acid-Base Excess: 2.6 mmol/L — ABNORMAL HIGH (ref 0.0–2.0)
Bicarbonate: 26.3 mEq/L — ABNORMAL HIGH (ref 20.0–24.0)
FIO2: 0.21 %
O2 Saturation: 95.6 %
Patient temperature: 37
pO2, Arterial: 72.7 mmHg — ABNORMAL LOW (ref 80.0–100.0)

## 2012-05-18 LAB — SALICYLATE LEVEL: Salicylate Lvl: 0 mg/dL — ABNORMAL LOW (ref 2.8–20.0)

## 2012-05-18 LAB — CBC WITH DIFFERENTIAL/PLATELET
Basophils Relative: 1 % (ref 0–1)
Eosinophils Absolute: 0.1 10*3/uL (ref 0.0–0.7)
Eosinophils Relative: 1 % (ref 0–5)
Hemoglobin: 11.3 g/dL — ABNORMAL LOW (ref 13.0–17.0)
MCH: 36.6 pg — ABNORMAL HIGH (ref 26.0–34.0)
MCHC: 34.6 g/dL (ref 30.0–36.0)
MCV: 105.8 fL — ABNORMAL HIGH (ref 78.0–100.0)
Monocytes Absolute: 0.8 10*3/uL (ref 0.1–1.0)
Monocytes Relative: 11 % (ref 3–12)
Neutrophils Relative %: 63 % (ref 43–77)

## 2012-05-18 LAB — COMPREHENSIVE METABOLIC PANEL
Albumin: 2.2 g/dL — ABNORMAL LOW (ref 3.5–5.2)
BUN: 6 mg/dL (ref 6–23)
Calcium: 8.6 mg/dL (ref 8.4–10.5)
Creatinine, Ser: 0.77 mg/dL (ref 0.50–1.35)
GFR calc Af Amer: >90 mL/min (ref >90)
Glucose, Bld: 83 mg/dL (ref 70–99)
Potassium: 3.4 mEq/L — ABNORMAL LOW (ref 3.5–5.1)
Total Protein: 7.1 g/dL (ref 6.0–8.3)

## 2012-05-18 LAB — URINE MICROSCOPIC-ADD ON

## 2012-05-18 LAB — PROTIME-INR: Prothrombin Time: 14.7 seconds (ref 11.6–15.2)

## 2012-05-18 LAB — RAPID URINE DRUG SCREEN, HOSP PERFORMED
Amphetamines: NOT DETECTED
Opiates: POSITIVE — AB

## 2012-05-18 LAB — URINALYSIS, ROUTINE W REFLEX MICROSCOPIC
Ketones, ur: NEGATIVE mg/dL
Leukocytes, UA: NEGATIVE
Protein, ur: NEGATIVE mg/dL
Urobilinogen, UA: 0.2 mg/dL (ref 0.0–1.0)

## 2012-05-18 LAB — CK: Total CK: 111 U/L (ref 7–232)

## 2012-05-18 LAB — AMMONIA: Ammonia: 24 umol/L (ref 11–60)

## 2012-05-18 LAB — LACTIC ACID, PLASMA
Lactic Acid, Venous: 1.3 mmol/L (ref 0.5–2.2)
Lactic Acid, Venous: 1.3 mmol/L (ref 0.5–2.2)

## 2012-05-18 LAB — ACETAMINOPHEN LEVEL: Acetaminophen (Tylenol), Serum: <15 ug/mL (ref 10–30)

## 2012-05-18 LAB — ETHANOL: Alcohol, Ethyl (B): <11 mg/dL (ref 0–11)

## 2012-05-18 MED ORDER — LORAZEPAM 2 MG/ML IJ SOLN
0.0000 mg | Freq: Four times a day (QID) | INTRAMUSCULAR | Status: AC
  Administered 2012-05-18: 2 mg via INTRAVENOUS
  Administered 2012-05-18: 4 mg via INTRAVENOUS
  Administered 2012-05-19: 2 mg via INTRAVENOUS
  Administered 2012-05-19 (×3): 4 mg via INTRAVENOUS
  Administered 2012-05-20: 2 mg via INTRAVENOUS
  Filled 2012-05-18 (×4): qty 2
  Filled 2012-05-18 (×2): qty 1
  Filled 2012-05-18: qty 2
  Filled 2012-05-18: qty 1

## 2012-05-18 MED ORDER — FUROSEMIDE 10 MG/ML IJ SOLN
40.0000 mg | Freq: Two times a day (BID) | INTRAMUSCULAR | Status: DC
  Administered 2012-05-18: 40 mg via INTRAVENOUS
  Filled 2012-05-18: qty 4

## 2012-05-18 MED ORDER — LACTULOSE 10 GM/15ML PO SOLN
30.0000 g | Freq: Two times a day (BID) | ORAL | Status: DC
  Administered 2012-05-19 – 2012-05-22 (×7): 30 g via ORAL
  Filled 2012-05-18: qty 60
  Filled 2012-05-18: qty 30
  Filled 2012-05-18 (×6): qty 60

## 2012-05-18 MED ORDER — CIPROFLOXACIN IN D5W 400 MG/200ML IV SOLN
INTRAVENOUS | Status: AC
  Filled 2012-05-18: qty 200

## 2012-05-18 MED ORDER — THIAMINE HCL 100 MG/ML IJ SOLN
100.0000 mg | Freq: Every day | INTRAMUSCULAR | Status: DC
  Administered 2012-05-19: 100 mg via INTRAVENOUS
  Filled 2012-05-18: qty 2

## 2012-05-18 MED ORDER — FLUTICASONE-SALMETEROL 250-50 MCG/DOSE IN AEPB
1.0000 | INHALATION_SPRAY | Freq: Two times a day (BID) | RESPIRATORY_TRACT | Status: DC
  Administered 2012-05-19 – 2012-05-22 (×7): 1 via RESPIRATORY_TRACT
  Filled 2012-05-18: qty 14

## 2012-05-18 MED ORDER — CLONIDINE HCL 0.1 MG PO TABS: 0.1000 mg | ORAL_TABLET | Freq: Every day | ORAL | Status: DC

## 2012-05-18 MED ORDER — SODIUM CHLORIDE 0.9 % IJ SOLN
3.0000 mL | Freq: Two times a day (BID) | INTRAMUSCULAR | Status: DC
  Administered 2012-05-19 – 2012-05-21 (×4): 3 mL via INTRAVENOUS
  Filled 2012-05-18 (×6): qty 3

## 2012-05-18 MED ORDER — SODIUM CHLORIDE 0.9 % IV BOLUS (SEPSIS)
1000.0000 mL | Freq: Once | INTRAVENOUS | Status: AC
  Administered 2012-05-18: 1000 mL via INTRAVENOUS

## 2012-05-18 MED ORDER — ACETAMINOPHEN 650 MG RE SUPP
650.0000 mg | Freq: Four times a day (QID) | RECTAL | Status: DC | PRN
  Administered 2012-05-18: 650 mg via RECTAL
  Filled 2012-05-18: qty 1

## 2012-05-18 MED ORDER — VANCOMYCIN HCL IN DEXTROSE 1-5 GM/200ML-% IV SOLN
1000.0000 mg | Freq: Three times a day (TID) | INTRAVENOUS | Status: DC
  Administered 2012-05-19 – 2012-05-20 (×5): 1000 mg via INTRAVENOUS
  Filled 2012-05-18 (×5): qty 200

## 2012-05-18 MED ORDER — LORAZEPAM 2 MG/ML IJ SOLN
1.0000 mg | Freq: Four times a day (QID) | INTRAMUSCULAR | Status: AC | PRN
  Administered 2012-05-19 – 2012-05-21 (×4): 1 mg via INTRAVENOUS
  Filled 2012-05-18 (×3): qty 1

## 2012-05-18 MED ORDER — LORAZEPAM 0.5 MG PO TABS
1.0000 mg | ORAL_TABLET | Freq: Four times a day (QID) | ORAL | Status: AC | PRN
  Administered 2012-05-19 – 2012-05-20 (×3): 1 mg via ORAL
  Filled 2012-05-18 (×3): qty 2

## 2012-05-18 MED ORDER — ENOXAPARIN SODIUM 40 MG/0.4ML ~~LOC~~ SOLN
40.0000 mg | SUBCUTANEOUS | Status: DC
  Administered 2012-05-18: 40 mg via SUBCUTANEOUS
  Filled 2012-05-18: qty 0.4

## 2012-05-18 MED ORDER — LORAZEPAM 2 MG/ML IJ SOLN
0.0000 mg | Freq: Two times a day (BID) | INTRAMUSCULAR | Status: DC
  Administered 2012-05-21: 4 mg via INTRAVENOUS
  Filled 2012-05-18: qty 2

## 2012-05-18 MED ORDER — SODIUM CHLORIDE 0.9 % IJ SOLN
INTRAMUSCULAR | Status: AC
  Filled 2012-05-18: qty 3

## 2012-05-18 MED ORDER — ONDANSETRON HCL 4 MG/2ML IJ SOLN: 4.0000 mg | Freq: Three times a day (TID) | INTRAMUSCULAR | Status: DC | PRN

## 2012-05-18 MED ORDER — HALOPERIDOL LACTATE 5 MG/ML IJ SOLN
5.0000 mg | Freq: Once | INTRAMUSCULAR | Status: AC
  Administered 2012-05-18: 5 mg via INTRAVENOUS
  Filled 2012-05-18: qty 1

## 2012-05-18 MED ORDER — VITAMIN B-1 100 MG PO TABS
100.0000 mg | ORAL_TABLET | Freq: Every day | ORAL | Status: DC
  Administered 2012-05-20 – 2012-05-22 (×3): 100 mg via ORAL
  Filled 2012-05-18 (×3): qty 1

## 2012-05-18 MED ORDER — DEXTROSE 5 % IV SOLN
1.0000 g | Freq: Three times a day (TID) | INTRAVENOUS | Status: DC
  Administered 2012-05-19 (×2): 1 g via INTRAVENOUS
  Filled 2012-05-18 (×5): qty 1

## 2012-05-18 MED ORDER — FLUTICASONE-SALMETEROL 250-50 MCG/DOSE IN AEPB
INHALATION_SPRAY | RESPIRATORY_TRACT | Status: AC
  Filled 2012-05-18: qty 14

## 2012-05-18 MED ORDER — ADULT MULTIVITAMIN W/MINERALS CH
1.0000 | ORAL_TABLET | Freq: Every day | ORAL | Status: DC
  Administered 2012-05-19 – 2012-05-22 (×4): 1 via ORAL
  Filled 2012-05-18 (×4): qty 1

## 2012-05-18 MED ORDER — FOLIC ACID 1 MG PO TABS
1.0000 mg | ORAL_TABLET | Freq: Every day | ORAL | Status: DC
  Administered 2012-05-20 – 2012-05-22 (×3): 1 mg via ORAL
  Filled 2012-05-18 (×3): qty 1

## 2012-05-18 MED ORDER — QUETIAPINE FUMARATE 25 MG PO TABS
25.0000 mg | ORAL_TABLET | Freq: Two times a day (BID) | ORAL | Status: DC
  Administered 2012-05-19 – 2012-05-22 (×6): 25 mg via ORAL
  Filled 2012-05-18 (×11): qty 1

## 2012-05-18 MED ORDER — ONDANSETRON HCL 4 MG/2ML IJ SOLN: 4.0000 mg | Freq: Four times a day (QID) | INTRAMUSCULAR | Status: DC | PRN

## 2012-05-18 MED ORDER — NALOXONE HCL 0.4 MG/ML IJ SOLN
0.4000 mg | Freq: Once | INTRAMUSCULAR | Status: AC
  Administered 2012-05-18: 0.4 mg via INTRAVENOUS
  Filled 2012-05-18: qty 1

## 2012-05-18 MED ORDER — LEVOFLOXACIN IN D5W 750 MG/150ML IV SOLN
750.0000 mg | INTRAVENOUS | Status: DC
  Administered 2012-05-19 – 2012-05-21 (×3): 750 mg via INTRAVENOUS
  Filled 2012-05-18 (×3): qty 150

## 2012-05-18 MED ORDER — CIPROFLOXACIN IN D5W 400 MG/200ML IV SOLN
400.0000 mg | Freq: Two times a day (BID) | INTRAVENOUS | Status: DC
  Filled 2012-05-18 (×4): qty 200

## 2012-05-18 MED ORDER — ONDANSETRON HCL 4 MG PO TABS: 4.0000 mg | ORAL_TABLET | Freq: Four times a day (QID) | ORAL | Status: DC | PRN

## 2012-05-18 MED ORDER — ACETAMINOPHEN 325 MG PO TABS
650.0000 mg | ORAL_TABLET | Freq: Four times a day (QID) | ORAL | Status: DC | PRN
  Administered 2012-05-19 – 2012-05-21 (×3): 650 mg via ORAL
  Filled 2012-05-18 (×3): qty 2

## 2012-05-18 MED ORDER — IOHEXOL 300 MG/ML  SOLN
100.0000 mL | Freq: Once | INTRAMUSCULAR | Status: AC | PRN
  Administered 2012-05-18: 100 mL via INTRAVENOUS

## 2012-05-18 MED ORDER — PANTOPRAZOLE SODIUM 40 MG IV SOLR
40.0000 mg | INTRAVENOUS | Status: DC
  Administered 2012-05-18 – 2012-05-21 (×4): 40 mg via INTRAVENOUS
  Filled 2012-05-18 (×4): qty 40

## 2012-05-18 MED ORDER — ALBUTEROL SULFATE (5 MG/ML) 0.5% IN NEBU
2.5000 mg | INHALATION_SOLUTION | Freq: Four times a day (QID) | RESPIRATORY_TRACT | Status: DC
  Administered 2012-05-18 – 2012-05-22 (×13): 2.5 mg via RESPIRATORY_TRACT
  Filled 2012-05-18 (×14): qty 0.5

## 2012-05-18 MED ORDER — ALBUTEROL SULFATE (5 MG/ML) 0.5% IN NEBU
2.5000 mg | INHALATION_SOLUTION | RESPIRATORY_TRACT | Status: DC | PRN
  Administered 2012-05-18 – 2012-05-19 (×2): 2.5 mg via RESPIRATORY_TRACT
  Filled 2012-05-18 (×2): qty 0.5

## 2012-05-18 MED ORDER — SODIUM CHLORIDE 0.9 % IV SOLN
INTRAVENOUS | Status: AC
  Administered 2012-05-18: 14:00:00 via INTRAVENOUS
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
## 2012-05-18 NOTE — ED Notes (Signed)
Patient found laying on floor with mother. Patient denies pain or hitting head. Patient denies fall.

## 2012-05-18 NOTE — ED Provider Notes (Signed)
History   This chart was scribed for Glynn Octave, MD by Charolett Bumpers . The patient was seen in room APA18/APA18. Patient's care was started at 0753.   CSN: [id number] Arrival date & time 05/18/12  7829  First MD Initiated Contact with Patient 05/18/12 [id number]      Chief Complaint  Patient presents with  . Fall   Level 5 Caveat: Alcohol Induced Dementia The history is provided by the patient. No language interpreter was used.   Roberto [name] is a 66 y.o. male who presents to the Emergency Department complaining of fall that occurred earlier this morning. Per EMS, pt was found on floor with just a shirt on and what appears like vomit on his shirt and around mouth. He was found with his mother, who fell trying to help him up. He denies any ETOH use, but later admits to drinking 2-3 beers. He denies any chest pain, abdominal pain. Pt is lethargic in ED, slurred speech, arousable to verbal stimuli.   Past Medical History  Diagnosis Date  . COPD (chronic obstructive pulmonary disease)   . Hypertension   . Hepatic steatosis 12/14/2011  . Compression fracture of L1 lumbar vertebra 12/14/2011  . C2 cervical fracture   . Duodenal ulcer with hemorrhage 12/15/2011    Likely source of bleeding.per EGD; Dr. Karilyn Cota  . Esophageal varices without bleeding 12/15/2011    per EGD  . Cirrhosis 12/16/2011    per ultrasound  . Hx of substance abuse 12/17/2011  . 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 Hepatic encephalopathy   . Helicobacter pylori antibody positive 12/19/2011    Past Surgical History  Procedure Date  . Coronary angioplasty with stent placement     History reviewed. No pertinent family history.  History  Substance Use Topics  . Smoking status: Current Every Day Smoker -- 1.0 packs/day  . Smokeless tobacco: Never Used   Comment: 1 1/2 pack a day  . Alcohol Use: Yes     3 times a week. Quit drinking 45 days ago.  He was drinking a 5th a  day, and before that he was drinking a twelve pack a day.      Review of Systems  Unable to perform ROS: Other  Alcohol induced dementia.   Allergies  Review of patient's allergies indicates no known allergies.  Home Medications   Current Outpatient Rx  Name Route Sig Dispense Refill  . ALPRAZOLAM 1 MG PO TABS Oral Take 0.5 mg by mouth 3 (three) times daily as needed. Anxiety.    Marland Kitchen CLONIDINE HCL 0.1 MG PO TABS Oral Take 1 tablet (0.1 mg total) by mouth at bedtime. 60 tablet   . DOCUSATE SODIUM 100 MG PO CAPS Oral Take 1 capsule (100 mg total) by mouth every 12 (twelve) hours. 30 capsule 1  . FLUTICASONE-SALMETEROL 250-50 MCG/DOSE IN AEPB Inhalation Inhale 1 puff into the lungs every 12 (twelve) hours.    Marland Kitchen FOLIC ACID 1 MG PO TABS Oral Take 1 tablet (1 mg total) by mouth daily.    Marland Kitchen HYDROCODONE-ACETAMINOPHEN 5-325 MG PO TABS Oral Take 1 tablet by mouth every 6 (six) hours as needed for pain. 30 tablet 0  . LACTULOSE 10 GM/15ML PO SOLN      . OMEPRAZOLE 40 MG PO CPDR Oral Take 1 capsule (40 mg total) by mouth 2 (two) times daily. Acid Reflux    . THIAMINE HCL 100 MG PO TABS Oral Take 1 tablet (100 mg total)  by mouth daily.    . TRAMADOL HCL 50 MG PO TABS Oral Take 50 mg by mouth 2 (two) times daily.    Marland Kitchen ZOLPIDEM TARTRATE 5 MG PO TABS Oral Take 5 mg by mouth at bedtime as needed. Sleep    . ENSURE COMPLETE PO LIQD Oral Take 237 mLs by mouth 3 (three) times daily between meals.    Marland Kitchen PRO-STAT 64 PO LIQD Oral Take 30 mLs by mouth 3 (three) times daily with meals. 900 mL     BP 149/116  Pulse 71  Temp 97.8 F (36.6 C) (Oral)  Resp 16  Ht 6\' 1"  (1.854 m)  Wt 182 lb (82.555 kg)  BMI 24.01 kg/m2  SpO2 99%  Physical Exam  Nursing note and vitals reviewed. Constitutional: He is oriented to person, place, and time. He appears well-developed and well-nourished. He appears lethargic. No distress.       Arousable to verbal stimula. Follows commands inconsistently.   HENT:  Head:  Normocephalic and atraumatic.       Vomit on face and oropharynx.   Eyes: EOM are normal. Pupils are equal, round, and reactive to light.  Neck: Normal range of motion. Neck supple. No tracheal deviation present.  Cardiovascular: Normal rate, regular rhythm and normal heart sounds.   No murmur heard. Pulmonary/Chest: Effort normal and breath sounds normal. No respiratory distress. He has no wheezes.  Abdominal: Soft. Bowel sounds are normal. He exhibits distension. There is no tenderness.  Musculoskeletal: Normal range of motion. He exhibits edema.       +1 pitting edema.   Neurological: He is oriented to person, place, and time. He appears lethargic.       Grip strength normal.   Skin: Skin is warm and dry.       Elliptical skin tear to dorsum of right hand. Old ecchymosis to right forearm. Large ecchymosis to left flank that appears to be old.   Psychiatric: He has a normal mood and affect. His behavior is normal.    ED Course  Procedures (including critical care time)  DIAGNOSTIC STUDIES: Oxygen Saturation is 98% on room air, normal by my interpretation.    COORDINATION OF CARE:  08:15-Preformed physical exam. Will order IV fluids, blood work, UA, x-rays of chest, right forearm, CT scans of head, neck and abdomen.   08:30-Medication Orders: Sodium chloride 0.9% bolus 1,000 mL-once.   11:22-Pt is ambulatory in ED.  Labs Reviewed  CBC WITH DIFFERENTIAL - Abnormal; Notable for the following:    RBC 3.09 (*)     Hemoglobin 11.3 (*)     HCT 32.7 (*)     MCV 105.8 (*)     MCH 36.6 (*)     RDW 18.2 (*)     All other components within normal limits  COMPREHENSIVE METABOLIC PANEL - Abnormal; Notable for the following:    Potassium 3.4 (*)     Albumin 2.2 (*)     AST 47 (*)     All other components within normal limits  URINALYSIS, ROUTINE W REFLEX MICROSCOPIC - Abnormal; Notable for the following:    Hgb urine dipstick TRACE (*)     All other components within normal limits    URINE RAPID DRUG SCREEN (HOSP PERFORMED) - Abnormal; Notable for the following:    Opiates POSITIVE (*)     Benzodiazepines POSITIVE (*)     All other components within normal limits  SALICYLATE LEVEL - Abnormal; Notable for the following:  Salicylate Lvl 0.0 (*)     All other components within normal limits  BLOOD GAS, ARTERIAL - Abnormal; Notable for the following:    pO2, Arterial 72.7 (*)     Bicarbonate 26.3 (*)     Acid-Base Excess 2.6 (*)     All other components within normal limits  GLUCOSE, CAPILLARY  ETHANOL  LACTIC ACID, PLASMA  AMMONIA  ACETAMINOPHEN LEVEL  PROTIME-INR  CK  URINE MICROSCOPIC-ADD ON   Dg Chest 1 View  05/18/2012  *RADIOLOGY REPORT*  Clinical Data: Fall.  CHEST - 1 VIEW  Comparison: 04/16/2012  Findings: Subsegmental atelectasis noted in the right lower lobe and left mid lung. The patient is rotated to the right on today's exam, resulting in reduced diagnostic sensitivity and specificity. Heart size within normal limits given the projection and rotation. No pneumothorax observed.  IMPRESSION:  1. Subsegmental subsegmental atelectasis in the right lower lobe and left mid lung.   Original Report Authenticated By: Dellia Cloud, M.D.    Dg Forearm Right  05/18/2012  *RADIOLOGY REPORT*  Clinical Data: Fall.  RIGHT FOREARM - 2 VIEW  Comparison: None.  Findings: Mildly irregular spurring noted along the capitellum.  No right forearm fracture is identified.  IMPRESSION:  1.  Mild spurring along the capitellum.  No forearm fracture identified.   Original Report Authenticated By: Dellia Cloud, M.D.    Ct Head Wo Contrast  05/18/2012  *RADIOLOGY REPORT*  Clinical Data:  Fall.  Hypertension.  Prior C2 cervical fracture.  CT HEAD WITHOUT CONTRAST CT CERVICAL SPINE WITHOUT CONTRAST  Technique:  Multidetector CT imaging of the head and cervical spine was performed following the standard protocol without intravenous contrast.  Multiplanar CT image  reconstructions of the cervical spine were also generated.  Comparison:  Multiple exams, including 12/13/2011 and 08/12/2011  CT HEAD  Findings: The brain stem, cerebellum, cerebral peduncles, thalami, basal ganglia, basilar cisterns, and ventricular system appear unremarkable.  No intracranial hemorrhage, mass lesion, or acute infarction is identified.  IMPRESSION:  No significant abnormality identified.  CT CERVICAL SPINE  Findings: Today's imaging excludes the anterior inferior endplate of C7 and the T1 level.  Facet arthropathy noted on the right at multiple levels, most particularly at C2-3 and C3-4.  Bridging bone across the old fracture of the anterior base of the CT noted.  There is 1 mm of degenerative anterior subluxation of C3 on C4. Uncinate spurring at C5-6 contributes to mild osseous foraminal narrowing.  There is also mild osseous foraminal narrowing on the right at C3-4 due to intervertebral and facet spurring.  No prevertebral soft tissue swelling noted.  No acute fracture observed.  Disc bulges and disc protrusions at all levels between C3 and C7 likely contribute to central stenosis.  Bilateral carotid atherosclerotic calcification noted.  IMPRESSION:  1.  No new fracture observed.  Bony bridging across the old fracture of the anterior base of C2 noted. 2.  Cervical spondylosis and degenerative disc disease likely contributing to multilevel impingement, but not thought to be acute. 3.  Today's imaging exclude the anterior inferior endplate of C7, and the T1 level.   Original Report Authenticated By: Dellia Cloud, M.D.    Ct Cervical Spine Wo Contrast  05/18/2012  *RADIOLOGY REPORT*  Clinical Data:  Fall.  Hypertension.  Prior C2 cervical fracture.  CT HEAD WITHOUT CONTRAST CT CERVICAL SPINE WITHOUT CONTRAST  Technique:  Multidetector CT imaging of the head and cervical spine was performed following the standard protocol  without intravenous contrast.  Multiplanar CT image  reconstructions of the cervical spine were also generated.  Comparison:  Multiple exams, including 12/13/2011 and 08/12/2011  CT HEAD  Findings: The brain stem, cerebellum, cerebral peduncles, thalami, basal ganglia, basilar cisterns, and ventricular system appear unremarkable.  No intracranial hemorrhage, mass lesion, or acute infarction is identified.  IMPRESSION:  No significant abnormality identified.  CT CERVICAL SPINE  Findings: Today's imaging excludes the anterior inferior endplate of C7 and the T1 level.  Facet arthropathy noted on the right at multiple levels, most particularly at C2-3 and C3-4.  Bridging bone across the old fracture of the anterior base of the CT noted.  There is 1 mm of degenerative anterior subluxation of C3 on C4. Uncinate spurring at C5-6 contributes to mild osseous foraminal narrowing.  There is also mild osseous foraminal narrowing on the right at C3-4 due to intervertebral and facet spurring.  No prevertebral soft tissue swelling noted.  No acute fracture observed.  Disc bulges and disc protrusions at all levels between C3 and C7 likely contribute to central stenosis.  Bilateral carotid atherosclerotic calcification noted.  IMPRESSION:  1.  No new fracture observed.  Bony bridging across the old fracture of the anterior base of C2 noted. 2.  Cervical spondylosis and degenerative disc disease likely contributing to multilevel impingement, but not thought to be acute. 3.  Today's imaging exclude the anterior inferior endplate of C7, and the T1 level.   Original Report Authenticated By: Dellia Cloud, M.D.    Ct Abdomen Pelvis W Contrast  05/18/2012  *RADIOLOGY REPORT*  Clinical Data: Fall.  History of cirrhosis  CT ABDOMEN AND PELVIS WITH CONTRAST  Technique:  Multidetector CT imaging of the abdomen and pelvis was performed following the standard protocol during bolus administration of intravenous contrast.  Contrast: OMNIPAQUE IOHEXOL 300 MG/ML  SOLN  Comparison:  05/14/2012  Findings:  Atelectasis noted within the lung bases.  No pericardial or pleural effusion identified.  Morphologic features of the liver are identified.  No focal liver abnormality identified.  Gallbladder appears collapsed and increased in density likely reflecting vicarious excretion of contrast material.  No biliary dilatation.  The pancreas appears normal.  The spleen is enlarged measuring 14 cm in length.  Both adrenal glands are normal.  Normal appearance of the kidneys. The urinary bladder appears normal.  Calcifications within an enlarged prostate gland identified.  Seminal vesicles appear normal.  No adenopathy identified within the abdomen or pelvis.  There is a marked amount of ascites identified within the abdomen and pelvis.  The volume is unchanged from previous examination. 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  There is recanalization of the umbilical vein.  The stomach appears normal.  The small bowel loops are unremarkable. Normal appearance of the colon  Review of the visualized bony structures is significant for mild multilevel spondylosis within the thoracic spine.  Wedge compression deformities involving the T12 and L1 vertebra are similar to prior exam. Stable left posterior rib fractures.  IMPRESSION:  1.  No acute findings and no significant change from 05/14/2012. 2.  Cirrhosis with portal venous hypertension. 3.  Stable T12 and L1 compression deformities.  4.  Stable fractures involving the left tenth and eleventh ribs.   Original Report Authenticated By: Rosealee Albee, M.D.      No diagnosis found.    MDM  History of alcohol abuse, cirrhosis found down at home under unknown circumstances. Somnolence, arouses to pain  follows commands intermittently. protecting airway.  CT head negative.  No acute findings on CT abdomen.  Patient remains altered with waxing and waning mental status. Loud and belligerent at times. Requiring  sedation meds for safetly. Old ecchymosis on flank and arm. No evidence of acute significant injury. Small laceration to hand.  Etoh negative. ASA, APAP negative. UA, CXR clear.  UDS positive for opiates and benzos. Ammonia wnl.  Consider drug effect v alcohol withdrawal though no tachycardia or hypertension. Admission for further care.   Date: 05/18/2012  Rate: 71  Rhythm: normal sinus rhythm  QRS Axis: normal  Intervals: normal  ST/T Wave abnormalities: normal  Conduction Disutrbances:none  Narrative Interpretation:   Old EKG Reviewed: unchanged   CRITICAL CARE Performed by: Glynn Octave   Total critical care time: 30  Critical care time was exclusive of separately billable procedures and treating other patients.  Critical care was necessary to treat or prevent imminent or life-threatening deterioration.  Critical care was time spent personally by me on the following activities: development of treatment plan with patient and/or surrogate as well as nursing, discussions with consultants, evaluation of patient's response to treatment, examination of patient, obtaining history from patient or surrogate, ordering and performing treatments and interventions, ordering and review of laboratory studies, ordering and review of radiographic studies, pulse oximetry and re-evaluation of patient's condition.   I personally performed the services described in this documentation, which was scribed in my presence.  The recorded information has been reviewed and considered.       Glynn Octave, MD 05/18/12 [id number]

## 2012-05-18 NOTE — Progress Notes (Signed)
Started Pt on nebs , gave 1 pre ntsx gave 1 after with albuterol unable to give advair mdi. Pt resp40-32 still sats 99

## 2012-05-18 NOTE — Progress Notes (Signed)
Report called to [facility] in ICU

## 2012-05-18 NOTE — ED Notes (Signed)
Patient with slurred speech, excessively loud speech. Arouseable with verbal stimuli, but poor focus, needs constant redirection when asked questions. Patient denies ETOH use. Per EMS patient found in floor this morning with just a shirt on and what appears like vomit on his shirt and around mouth. Patient placed on cardiac monitor.

## 2012-05-18 NOTE — ED Notes (Signed)
Per EMS patient was treated last night for hypoglycemia. Per EMS blood glucose 100.

## 2012-05-18 NOTE — Progress Notes (Signed)
Patient vital signs - BP 103/63 , Resp 42 (irregular and swallow), Temp 102.4 axillary, HR 112, O2 97% on 4L, called and spoke with Dr. Orvan Falconer about vital signs and concerns, new orders given for patient to be transferred to ICU

## 2012-05-18 NOTE — Progress Notes (Signed)
ANTIBIOTIC CONSULT NOTE - INITIAL  Pharmacy Consult for vancomycin, levofloxacin, and cefepime Indication: suspected pneumonia  No Known Allergies  Patient Measurements: Height: 6\' 1"  (185.4 cm) Weight: 182 lb (82.555 kg) IBW/kg (Calculated) : 79.9    Vital Signs: Temp: 102.4 F (39.1 C) (10/19 2214) Temp src: Axillary (10/19 2214) BP: 103/63 mmHg (10/19 2214) Pulse Rate: 112  (10/19 2214) Intake/Output from previous day:   Intake/Output from this shift: Total I/O In: -  Out: 1800 [Urine:1800]  Labs:  Basename 05/18/12 0830  WBC 6.7  HGB 11.3*  PLT 205  LABCREA --  CREATININE 0.77   Estimated Creatinine Clearance: 106.8 ml/min (by C-G formula based on Cr of 0.77). No results found for this basename: VANCOTROUGH:2,VANCOPEAK:2,VANCORANDOM:2,GENTTROUGH:2,GENTPEAK:2,GENTRANDOM:2,TOBRATROUGH:2,TOBRAPEAK:2,TOBRARND:2,AMIKACINPEAK:2,AMIKACINTROU:2,AMIKACIN:2, in the last 72 hours   Microbiology: Recent Results (from the past 720 hour(s))  URINE CULTURE     Status: Normal   Collection Time   05/14/12  1:50 PM      Component Value Range Status Comment   Specimen Description URINE, CLEAN CATCH   Final    Special Requests NONE   Final    Culture  Setup Time 05/15/2012 01:46   Final    Colony Count >=100,000 COLONIES/ML   Final    Culture PSEUDOMONAS AERUGINOSA   Final    Report Status 05/16/2012 FINAL   Final    Organism ID, Bacteria PSEUDOMONAS AERUGINOSA   Final     Medical History: Past Medical History  Diagnosis Date  . COPD (chronic obstructive pulmonary disease)   . Hypertension   . Hepatic steatosis 12/14/2011  . Compression fracture of L1 lumbar vertebra 12/14/2011  . C2 cervical fracture   . Duodenal ulcer with hemorrhage 12/15/2011    Likely source of bleeding.per EGD; Dr. Karilyn Cota  . Esophageal varices without bleeding 12/15/2011    per EGD  . Cirrhosis 12/16/2011    per ultrasound  . Hx of substance abuse 12/17/2011  . Alcohol dependence with  alcohol-induced persisting dementia 12/18/2011  . Hepatic encephalopathy   . Helicobacter pylori antibody positive 12/19/2011    Medications:  Scheduled:    . sodium chloride   Intravenous STAT  . albuterol  2.5 mg Nebulization Q6H  . ceFEPime (MAXIPIME) IV  1 g Intravenous Q8H  . cloNIDine  0.1 mg Oral QHS  . enoxaparin (LOVENOX) injection  40 mg Subcutaneous Q24H  . Fluticasone-Salmeterol  1 puff Inhalation Q12H  . folic acid  1 mg Oral Daily  . furosemide  40 mg Intravenous BID  . haloperidol lactate  5 mg Intravenous Once  . haloperidol lactate  5 mg Intravenous Once  . lactulose  30 g Oral BID  . levofloxacin (LEVAQUIN) IV  750 mg Intravenous Q24H  . LORazepam  0-4 mg Intravenous Q6H   Followed by  . LORazepam  0-4 mg Intravenous Q12H  . multivitamin with minerals  1 tablet Oral Daily  . naloxone  0.4 mg Intravenous Once  . pantoprazole (PROTONIX) IV  40 mg Intravenous Q24H  . QUEtiapine  25 mg Oral BID  . sodium chloride  1,000 mL Intravenous Once  . sodium chloride  3 mL Intravenous Q12H  . sodium chloride      . sodium chloride      . thiamine  100 mg Oral Daily   Or  . thiamine  100 mg Intravenous Daily  . vancomycin  1,000 mg Intravenous Q8H  . DISCONTD: ciprofloxacin  400 mg Intravenous Q12H   Assessment: Roberto [name] is a 66 yo  male with suspected pneumonia admitted after a fall this morning and being confused and lethargic in the ED.  He has a history of alcohol abuse and is currently on CIWA protocol.   Of note- Pt had positive urine Cx on 10/15 for Psudomonas aeruginosa.  Goal of Therapy:  Vancomycin trough level 15-20 mcg/ml  Plan:  Start Vancomycin 1 gram IV Q 8 hours and Measure antibiotic drug levels at steady state. Also start Cefepime 1 gram IV Q 8 hours and Levofloxacin 750 mg IV Q 24 hours.   Monitor BUN/SCr  Thank you,   Isaias Sakai Scarlett 05/18/2012,11:51 PM

## 2012-05-18 NOTE — Progress Notes (Signed)
Called and spoke with [name] (patients brother), and informed them that he was being transferred to ICU

## 2012-05-18 NOTE — ED Notes (Signed)
Got bedside commode, said he could not use it

## 2012-05-19 DIAGNOSIS — E43 Unspecified severe protein-calorie malnutrition: Secondary | ICD-10-CM | POA: Diagnosis present

## 2012-05-19 DIAGNOSIS — R188 Other ascites: Secondary | ICD-10-CM

## 2012-05-19 DIAGNOSIS — R4182 Altered mental status, unspecified: Secondary | ICD-10-CM

## 2012-05-19 DIAGNOSIS — A419 Sepsis, unspecified organism: Secondary | ICD-10-CM | POA: Diagnosis present

## 2012-05-19 DIAGNOSIS — J9600 Acute respiratory failure, unspecified whether with hypoxia or hypercapnia: Secondary | ICD-10-CM

## 2012-05-19 LAB — COMPREHENSIVE METABOLIC PANEL
AST: 46 U/L — ABNORMAL HIGH (ref 0–37)
Albumin: 1.8 g/dL — ABNORMAL LOW (ref 3.5–5.2)
BUN: 8 mg/dL (ref 6–23)
Creatinine, Ser: 0.88 mg/dL (ref 0.50–1.35)
Potassium: 3.3 mEq/L — ABNORMAL LOW (ref 3.5–5.1)
Total Protein: 6.5 g/dL (ref 6.0–8.3)

## 2012-05-19 LAB — CBC
HCT: 29.8 % — ABNORMAL LOW (ref 39.0–52.0)
MCH: 36.4 pg — ABNORMAL HIGH (ref 26.0–34.0)
MCV: 106.4 fL — ABNORMAL HIGH (ref 78.0–100.0)
RBC: 2.8 MIL/uL — ABNORMAL LOW (ref 4.22–5.81)
WBC: 21.5 10*3/uL — ABNORMAL HIGH (ref 4.0–10.5)

## 2012-05-19 LAB — STREP PNEUMONIAE URINARY ANTIGEN: Strep Pneumo Urinary Antigen: NEGATIVE

## 2012-05-19 MED ORDER — DEXTROSE 5 % IV SOLN
2.0000 g | Freq: Three times a day (TID) | INTRAVENOUS | Status: DC
  Administered 2012-05-19 – 2012-05-20 (×2): 2 g via INTRAVENOUS
  Filled 2012-05-19 (×6): qty 2

## 2012-05-19 MED ORDER — SODIUM CHLORIDE 0.9 % IV BOLUS (SEPSIS)
1000.0000 mL | Freq: Once | INTRAVENOUS | Status: AC
  Administered 2012-05-19: 1000 mL via INTRAVENOUS

## 2012-05-19 MED ORDER — NICOTINE 21 MG/24HR TD PT24
21.0000 mg | MEDICATED_PATCH | Freq: Every day | TRANSDERMAL | Status: DC
  Administered 2012-05-19 – 2012-05-22 (×4): 21 mg via TRANSDERMAL
  Filled 2012-05-19 (×4): qty 1

## 2012-05-19 MED ORDER — DEXTROSE 5 % IV SOLN
INTRAVENOUS | Status: AC
  Filled 2012-05-19: qty 1

## 2012-05-19 MED ORDER — FUROSEMIDE 40 MG PO TABS
40.0000 mg | ORAL_TABLET | Freq: Every day | ORAL | Status: DC
  Administered 2012-05-19 – 2012-05-22 (×4): 40 mg via ORAL
  Filled 2012-05-19 (×4): qty 1

## 2012-05-19 MED ORDER — VANCOMYCIN HCL IN DEXTROSE 1-5 GM/200ML-% IV SOLN
INTRAVENOUS | Status: AC
  Filled 2012-05-19: qty 200

## 2012-05-19 MED ORDER — SPIRONOLACTONE 25 MG PO TABS
100.0000 mg | ORAL_TABLET | Freq: Every day | ORAL | Status: DC
  Administered 2012-05-19 – 2012-05-20 (×2): 100 mg via ORAL
  Filled 2012-05-19 (×2): qty 4

## 2012-05-19 MED ORDER — LEVOFLOXACIN IN D5W 750 MG/150ML IV SOLN
INTRAVENOUS | Status: AC
  Filled 2012-05-19: qty 150

## 2012-05-19 NOTE — Consult Note (Addendum)
Referring Provider: No ref. provider found Primary Care Physician:  Kirk Ruths, MD Primary Gastroenterologist:  DR. Karilyn Cota  Reason for Consultation:  MENTAL STATUS CHANGES, ASCITES  HPI:  PT UNABLE TO GIVE A RELIABLE hX DUE TO MS CHANGES. NOTES REVIEWED IN EMR. PT HAS HX: ETOH CIRRHOSIS. FOUND DOWN WITH EMESIS ON HIS FACE. FAMILY STATES HIS ABD IS GETTING BIGGER. PT DENIES PRIOR PARACENTESIS. BEING TREATED WITH CEFEPIME AND LEVAQUIN FOR ? PNA.   Past Medical History  Diagnosis Date  . COPD (chronic obstructive pulmonary disease)   . Hypertension   . Hepatic steatosis 12/14/2011  . Compression fracture of L1 lumbar vertebra 12/14/2011  . C2 cervical fracture   . Duodenal ulcer with hemorrhage 12/15/2011    Likely source of bleeding.per EGD; Dr. Karilyn Cota  . Esophageal varices without bleeding 12/15/2011    per EGD  . Cirrhosis 12/16/2011    per ultrasound  . Hx of substance abuse 12/17/2011  . 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 Hepatic encephalopathy   . Helicobacter pylori antibody positive 12/19/2011    Past Surgical History  Procedure Date  . Coronary angioplasty with stent placement     Prior to Admission medications   Medication Sig Start Date End Date Taking? Authorizing Provider  ALPRAZolam Prudy Feeler) 1 MG tablet Take 0.5 mg by mouth 3 (three) times daily as needed. Anxiety.   Yes Historical Provider, MD  cloNIDine (CATAPRES) 0.1 MG tablet Take 1 tablet (0.1 mg total) by mouth at bedtime. 12/21/11 12/20/12 Yes Christiane Ha, MD  docusate sodium (COLACE) 100 MG capsule Take 1 capsule (100 mg total) by mouth every 12 (twelve) hours. 04/24/12  Yes Richard Hyacinth Meeker, PA  Fluticasone-Salmeterol (ADVAIR) 250-50 MCG/DOSE AEPB Inhale 1 puff into the lungs every 12 (twelve) hours.   Yes Historical Provider, MD  folic acid (FOLVITE) 1 MG tablet Take 1 tablet (1 mg total) by mouth daily. 12/21/11 12/20/12 Yes Christiane Ha, MD    HYDROcodone-acetaminophen (NORCO/VICODIN) 5-325 MG per tablet Take 1 tablet by mouth every 6 (six) hours as needed for pain. 05/14/12  Yes Benny Lennert, MD  lactulose (CHRONULAC) 10 GM/15ML solution  04/24/12  Yes Malissa Hippo, MD  omeprazole (PRILOSEC) 40 MG capsule Take 1 capsule (40 mg total) by mouth 2 (two) times daily. Acid Reflux 12/21/11  Yes Christiane Ha, MD  thiamine 100 MG tablet Take 1 tablet (100 mg total) by mouth daily. 12/21/11 12/20/12 Yes Christiane Ha, MD  traMADol (ULTRAM) 50 MG tablet Take 50 mg by mouth 2 (two) times daily.   Yes Historical Provider, MD  zolpidem (AMBIEN) 5 MG tablet Take 5 mg by mouth at bedtime as needed. Sleep   Yes Historical Provider, MD  feeding supplement (ENSURE COMPLETE) LIQD Take 237 mLs by mouth 3 (three) times daily between meals. 12/21/11   Christiane Ha, MD  feeding supplement (PRO-STAT SUGAR FREE 64) LIQD Take 30 mLs by mouth 3 (three) times daily with meals. 12/21/11   Christiane Ha, MD    Current Facility-Administered Medications  Medication Dose Route Frequency Provider Last Rate Last Dose  . 0.9 %  sodium chloride infusion   Intravenous STAT Glynn Octave, MD 75 mL/hr at 05/19/12 0200    . acetaminophen (TYLENOL) tablet 650 mg  650 mg Oral Q6H PRN Erick Blinks, MD   650 mg at 05/19/12 1348   Or  . acetaminophen (TYLENOL) suppository 650 mg  650 mg Rectal Q6H PRN Erick Blinks, MD  650 mg at 05/18/12 2256  . albuterol (PROVENTIL) (5 MG/ML) 0.5% nebulizer solution 2.5 mg  2.5 mg Nebulization Q6H Erick Blinks, MD   2.5 mg at 05/19/12 1305  . albuterol (PROVENTIL) (5 MG/ML) 0.5% nebulizer solution 2.5 mg  2.5 mg Nebulization Q2H PRN Erick Blinks, MD   2.5 mg at 05/18/12 2003  . ceFEPIme (MAXIPIME) 2 g in dextrose 5 % 50 mL IVPB  2 g Intravenous Q8H Mauriana Dann L Macon Lesesne, MD      . Fluticasone-Salmeterol (ADVAIR) 250-50 MCG/DOSE inhaler 1 puff  1 puff Inhalation Q12H Erick Blinks, MD   1 puff at 05/19/12 1312  .  folic acid (FOLVITE) tablet 1 mg  1 mg Oral Daily Erick Blinks, MD      . haloperidol lactate (HALDOL) injection 5 mg  5 mg Intravenous Once Glynn Octave, MD   5 mg at 05/18/12 1503  . haloperidol lactate (HALDOL) injection 5 mg  5 mg Intravenous Once Glynn Octave, MD   5 mg at 05/18/12 1520  . lactulose (CHRONULAC) 10 GM/15ML solution 30 g  30 g Oral BID Erick Blinks, MD   30 g at 05/19/12 1010  . levofloxacin (LEVAQUIN) IVPB 750 mg  750 mg Intravenous Q24H Erick Blinks, MD   750 mg at 05/19/12 0015  . LORazepam (ATIVAN) injection 0-4 mg  0-4 mg Intravenous Q6H Erick Blinks, MD   4 mg at 05/19/12 1235   Followed by  . LORazepam (ATIVAN) injection 0-4 mg  0-4 mg Intravenous Q12H Erick Blinks, MD      . LORazepam (ATIVAN) tablet 1 mg  1 mg Oral Q6H PRN Erick Blinks, MD       Or  . LORazepam (ATIVAN) injection 1 mg  1 mg Intravenous Q6H PRN Erick Blinks, MD   1 mg at 05/19/12 0909  . multivitamin with minerals tablet 1 tablet  1 tablet Oral Daily Erick Blinks, MD   1 tablet at 05/19/12 1010  . nicotine (NICODERM CQ - dosed in mg/24 hours) patch 21 mg  21 mg Transdermal Daily Erick Blinks, MD   21 mg at 05/19/12 1343  . ondansetron (ZOFRAN) tablet 4 mg  4 mg Oral Q6H PRN Erick Blinks, MD       Or  . ondansetron (ZOFRAN) injection 4 mg  4 mg Intravenous Q6H PRN Erick Blinks, MD      . pantoprazole (PROTONIX) injection 40 mg  40 mg Intravenous Q24H Erick Blinks, MD   40 mg at 05/18/12 2241  . QUEtiapine (SEROQUEL) tablet 25 mg  25 mg Oral BID Erick Blinks, MD      . sodium chloride 0.9 % bolus 1,000 mL  1,000 mL Intravenous Once Vania Rea, MD   1,000 mL at 05/19/12 0314  . sodium chloride 0.9 % injection 3 mL  3 mL Intravenous Q12H Erick Blinks, MD      . sodium chloride 0.9 % injection           . sodium chloride 0.9 % injection           . thiamine (VITAMIN B-1) tablet 100 mg  100 mg Oral Daily Erick Blinks, MD       Or  . thiamine (B-1) injection 100  mg  100 mg Intravenous Daily Erick Blinks, MD   100 mg at 05/19/12 1010  . vancomycin (VANCOCIN) IVPB 1000 mg/200 mL premix  1,000 mg Intravenous Q8H Erick Blinks, MD   1,000 mg at 05/19/12 0900  . DISCONTD: ceFEPIme (MAXIPIME) 1  g in dextrose 5 % 50 mL IVPB  1 g Intravenous Q8H Erick Blinks, MD   1 g at 05/19/12 1020  . DISCONTD: ciprofloxacin (CIPRO) IVPB 400 mg  400 mg Intravenous Q12H Erick Blinks, MD      . DISCONTD: cloNIDine (CATAPRES) tablet 0.1 mg  0.1 mg Oral QHS Erick Blinks, MD      . DISCONTD: enoxaparin (LOVENOX) injection 40 mg  40 mg Subcutaneous Q24H Erick Blinks, MD   40 mg at 05/18/12 1941  . DISCONTD: furosemide (LASIX) injection 40 mg  40 mg Intravenous BID Erick Blinks, MD   40 mg at 05/18/12 1942  . DISCONTD: ondansetron (ZOFRAN) injection 4 mg  4 mg Intravenous Q8H PRN Glynn Octave, MD        Allergies as of 05/18/2012  . (No Known Allergies)   History reviewed. No pertinent family history.  History   Social History  . Marital Status: Divorced    Spouse Name: N/A    Number of Children: N/A  . Years of Education: N/A   Occupational History  . Not on file.   Social History Main Topics  . Smoking status: Current Every Day Smoker -- 1.0 packs/day  . Smokeless tobacco: Never Used   Comment: 1 1/2 pack a day  . Alcohol Use: Yes     3 times a week. Quit drinking 45 days ago.  He was drinking a 5th a day, and before that he was drinking a twelve pack a day.  . Drug Use: No  . Sexually Active: Not Currently   Other Topics Concern  . Not on file   Social History Narrative  . No narrative on file    Review of Systems: LIMITED DUE TO PT COGNITIVE DYSFUNCTION  Vitals: Blood pressure 112/74, pulse 89, temperature 98.9 F (37.2 C), temperature source Oral, resp. rate 23, height 6\' 1"  (1.854 m), weight 227 lb 11.8 oz (103.3 kg), SpO2 100.00%.  Physical Exam: General:   Alert,  cooperative in NAD Head:  Normocephalic and atraumatic. Eyes:   Sclera no icterus.    Neck:  Supple;  Lungs:  Clear throughout to auscultation.   No wheezes. No acute distress. Heart:  Regular rate and rhythm; no murmurs. Abdomen:  Soft, nontender and distended. Normal bowel sounds, without guarding, and without rebound.   Neurologic:  NO NEW FOCAL DEFICITS Skin:  Intact without significant lesions or rashes. Psych:  CONFUSED, cooperative. FLAT affect.   Lab Results:  Basename 05/19/12 0459 05/18/12 0830  WBC 21.5* 6.7  HGB 10.2* 11.3*  HCT 29.8* 32.7*  PLT 175 205   BMET  Basename 05/19/12 0459 05/18/12 0830  NA 137 138  K 3.3* 3.4*  CL 103 104  CO2 25 28  GLUCOSE 102* 83  BUN 8 6  CREATININE 0.88 0.77  CALCIUM 7.6* 8.6   LFT  Basename 05/19/12 0459  PROT 6.5  ALBUMIN 1.8*  AST 46*  ALT 12  ALKPHOS 94  BILITOT 0.9  BILIDIR --  IBILI --     Studies/Results: CT A/P NL GB/PANCREAS, ASCITES CXR: NACPD-ATELECTASIS, LIMITED EXAM  Impression: MENTAL STATUS CHANGES IN A PT WITH NEW ONSET ASCITES AND NL AMMONIA LEVEL MOST LIKELY DUE TO SBP. LESS LIKELY ETOH WITHDRAWAL, MEDICATIONS, PNA OR DEHYDRATION. NO EVIDENCE FOR GIB.  Plan: 1. US PARACENTESIS 10/21, FLUID TO BE SENT FOR CELL COUNT/DIFF, CYTOLOGY/ CX 2. INCREASE CEFEPIME TO 2 GM IV Q8H 3. SOFT MECH DIET. ASSIST PT WITH MEALS 4. DAILY PPI 5. CONTINUE  LACTULOSE BID 6. RESTRICT NA AND FLUID. START ALDACTONE AND LASIX. 7. CHILD PUGH C/MELD 8-EGD UTD.    LOS: 1 day   Jrake Rodriquez  05/19/2012, 1:52 PM

## 2012-05-19 NOTE — Progress Notes (Signed)
Patient transferred to ICU via bed, O2 on via [place], no voiced complaints at this time

## 2012-05-19 NOTE — Progress Notes (Signed)
[facility]             Progress Note   Subjective: Events from overnight noted. Patient transferred to ICU for higher level of care due to respiratory distress and fever.  He remains confused, although appears less agitated at this time.  He was recently medicated. Still cannot provide any significant history.  Objective: Vital signs in last 24 hours: Temp:  [97.6 F (36.4 C)-102.4 F (39.1 C)] 98.5 F (36.9 C) (10/20 0800) Pulse Rate:  [71-112] 80  (10/20 0800) Resp:  [16-42] 32  (10/20 0800) BP: (86-149)/(55-116) 106/73 mmHg (10/20 0800) SpO2:  [93 %-100 %] 100 % (10/20 0832) Weight:  [82.555 kg (182 lb)-103.3 kg (227 lb 11.8 oz)] 103.3 kg (227 lb 11.8 oz) (10/20 0500) Weight change:  Last BM Date: 05/18/12  Intake/Output from previous day: 10/19 0701 - 10/20 0700 In: 2046.7 [I.V.:880; IV Piggyback:1166.7] Out: 1950 [Urine:1950]     Physical Exam: General: awake, says he wants to go home, confused, does not appear to be in distress HEENT: No bruits, no goiter. Heart: Regular rate and rhythm, without murmurs, rubs, gallops. Lungs: Clear to auscultation bilaterally. Abdomen: Soft, nontender, distended, positive bowel sounds. Extremities: 1-2+ edema b/l Neuro: Grossly intact, nonfocal.    Lab Results: Basic Metabolic Panel:  Basename 05/19/12 0459 05/18/12 0830  NA 137 138  K 3.3* 3.4*  CL 103 104  CO2 25 28  GLUCOSE 102* 83  BUN 8 6  CREATININE 0.88 0.77  CALCIUM 7.6* 8.6  MG -- --  PHOS -- --   Liver Function Tests:  Basename 05/19/12 0459 05/18/12 0830  AST 46* 47*  ALT 12 12  ALKPHOS 94 115  BILITOT 0.9 0.9  PROT 6.5 7.1  ALBUMIN 1.8* 2.2*   No results found for this basename: LIPASE:2,AMYLASE:2 in the last 72 hours  Basename 05/18/12 0831  AMMONIA 24   CBC:  Basename 05/19/12 0459 05/18/12 0830  WBC 21.5* 6.7  NEUTROABS -- 4.3  HGB 10.2* 11.3*  HCT 29.8* 32.7*  MCV 106.4* 105.8*  PLT 175 205   Cardiac  Enzymes:  Basename 05/18/12 0830  CKTOTAL 111  CKMB --  CKMBINDEX --  TROPONINI --   BNP:  Basename 05/18/12 0830  PROBNP 420.7*   D-Dimer: No results found for this basename: DDIMER:2 in the last 72 hours CBG:  Basename 05/18/12 0805  GLUCAP 90   Hemoglobin A1C: No results found for this basename: HGBA1C in the last 72 hours Fasting Lipid Panel: No results found for this basename: CHOL,HDL,LDLCALC,TRIG,CHOLHDL,LDLDIRECT in the last 72 hours Thyroid Function Tests: No results found for this basename: TSH,T4TOTAL,FREET4,T3FREE,THYROIDAB in the last 72 hours Anemia Panel: No results found for this basename: VITAMINB12,FOLATE,FERRITIN,TIBC,IRON,RETICCTPCT in the last 72 hours Coagulation:  Basename 05/18/12 0830  LABPROT 14.7  INR 1.17   Urine Drug Screen: Drugs of Abuse     Component Value Date/Time   LABOPIA POSITIVE* 05/18/2012 1053   COCAINSCRNUR NONE DETECTED 05/18/2012 1053   LABBENZ POSITIVE* 05/18/2012 1053   AMPHETMU NONE DETECTED 05/18/2012 1053   THCU NONE DETECTED 05/18/2012 1053   LABBARB NONE DETECTED 05/18/2012 1053    Alcohol Level:  Basename 05/18/12 0830  ETH <11   Urinalysis:  Basename 05/18/12 1053  COLORURINE YELLOW  LABSPEC 1.010  PHURINE 7.5  GLUCOSEU NEGATIVE  HGBUR TRACE*  BILIRUBINUR NEGATIVE  KETONESUR NEGATIVE  PROTEINUR NEGATIVE  UROBILINOGEN 0.2  NITRITE NEGATIVE  LEUKOCYTESUR NEGATIVE    Recent Results (from the past 240 hour(s))  URINE  CULTURE     Status: Normal   Collection Time   05/14/12  1:50 PM      Component Value Range Status Comment   Specimen Description URINE, CLEAN CATCH   Final    Special Requests NONE   Final    Culture  Setup Time 05/15/2012 01:46   Final    Colony Count >=100,000 COLONIES/ML   Final    Culture PSEUDOMONAS AERUGINOSA   Final    Report Status 05/16/2012 FINAL   Final    Organism ID, Bacteria PSEUDOMONAS AERUGINOSA   Final   CULTURE, BLOOD (ROUTINE X 2)     Status: Normal  (Preliminary result)   Collection Time   05/18/12 10:44 PM      Component Value Range Status Comment   Specimen Description Blood RIGHT ANTECUBITAL   Final    Special Requests BOTTLES DRAWN AEROBIC AND ANAEROBIC 7CC   Final    Culture NO GROWTH 1 DAY   Final    Report Status PENDING   Incomplete   CULTURE, BLOOD (ROUTINE X 2)     Status: Normal (Preliminary result)   Collection Time   05/18/12 10:48 PM      Component Value Range Status Comment   Specimen Description Blood BLOOD LEFT HAND   Final    Special Requests BOTTLES DRAWN AEROBIC AND ANAEROBIC 6CC   Final    Culture NO GROWTH 1 DAY   Final    Report Status PENDING   Incomplete   MRSA PCR SCREENING     Status: Normal   Collection Time   05/19/12  3:00 AM      Component Value Range Status Comment   MRSA by PCR NEGATIVE  NEGATIVE Final     Studies/Results: Dg Chest 1 View  05/18/2012  *RADIOLOGY REPORT*  Clinical Data: Fall.  CHEST - 1 VIEW  Comparison: 04/16/2012  Findings: Subsegmental atelectasis noted in the right lower lobe and left mid lung. The patient is rotated to the right on today's exam, resulting in reduced diagnostic sensitivity and specificity. Heart size within normal limits given the projection and rotation. No pneumothorax observed.  IMPRESSION:  1. Subsegmental subsegmental atelectasis in the right lower lobe and left mid lung.   Original Report Authenticated By: Dellia Cloud, M.D.    Dg Forearm Right  05/18/2012  *RADIOLOGY REPORT*  Clinical Data: Fall.  RIGHT FOREARM - 2 VIEW  Comparison: None.  Findings: Mildly irregular spurring noted along the capitellum.  No right forearm fracture is identified.  IMPRESSION:  1.  Mild spurring along the capitellum.  No forearm fracture identified.   Original Report Authenticated By: Dellia Cloud, M.D.    Ct Head Wo Contrast  05/18/2012  *RADIOLOGY REPORT*  Clinical Data:  Fall.  Hypertension.  Prior C2 cervical fracture.  CT HEAD WITHOUT CONTRAST CT  CERVICAL SPINE WITHOUT CONTRAST  Technique:  Multidetector CT imaging of the head and cervical spine was performed following the standard protocol without intravenous contrast.  Multiplanar CT image reconstructions of the cervical spine were also generated.  Comparison:  Multiple exams, including 12/13/2011 and 08/12/2011  CT HEAD  Findings: The brain stem, cerebellum, cerebral peduncles, thalami, basal ganglia, basilar cisterns, and ventricular system appear unremarkable.  No intracranial hemorrhage, mass lesion, or acute infarction is identified.  IMPRESSION:  No significant abnormality identified.  CT CERVICAL SPINE  Findings: Today's imaging excludes the anterior inferior endplate of C7 and the T1 level.  Facet arthropathy noted on the right at  multiple levels, most particularly at C2-3 and C3-4.  Bridging bone across the old fracture of the anterior base of the CT noted.  There is 1 mm of degenerative anterior subluxation of C3 on C4. Uncinate spurring at C5-6 contributes to mild osseous foraminal narrowing.  There is also mild osseous foraminal narrowing on the right at C3-4 due to intervertebral and facet spurring.  No prevertebral soft tissue swelling noted.  No acute fracture observed.  Disc bulges and disc protrusions at all levels between C3 and C7 likely contribute to central stenosis.  Bilateral carotid atherosclerotic calcification noted.  IMPRESSION:  1.  No new fracture observed.  Bony bridging across the old fracture of the anterior base of C2 noted. 2.  Cervical spondylosis and degenerative disc disease likely contributing to multilevel impingement, but not thought to be acute. 3.  Today's imaging exclude the anterior inferior endplate of C7, and the T1 level.   Original Report Authenticated By: Dellia Cloud, M.D.    Ct Cervical Spine Wo Contrast  05/18/2012  *RADIOLOGY REPORT*  Clinical Data:  Fall.  Hypertension.  Prior C2 cervical fracture.  CT HEAD WITHOUT CONTRAST CT CERVICAL SPINE  WITHOUT CONTRAST  Technique:  Multidetector CT imaging of the head and cervical spine was performed following the standard protocol without intravenous contrast.  Multiplanar CT image reconstructions of the cervical spine were also generated.  Comparison:  Multiple exams, including 12/13/2011 and 08/12/2011  CT HEAD  Findings: The brain stem, cerebellum, cerebral peduncles, thalami, basal ganglia, basilar cisterns, and ventricular system appear unremarkable.  No intracranial hemorrhage, mass lesion, or acute infarction is identified.  IMPRESSION:  No significant abnormality identified.  CT CERVICAL SPINE  Findings: Today's imaging excludes the anterior inferior endplate of C7 and the T1 level.  Facet arthropathy noted on the right at multiple levels, most particularly at C2-3 and C3-4.  Bridging bone across the old fracture of the anterior base of the CT noted.  There is 1 mm of degenerative anterior subluxation of C3 on C4. Uncinate spurring at C5-6 contributes to mild osseous foraminal narrowing.  There is also mild osseous foraminal narrowing on the right at C3-4 due to intervertebral and facet spurring.  No prevertebral soft tissue swelling noted.  No acute fracture observed.  Disc bulges and disc protrusions at all levels between C3 and C7 likely contribute to central stenosis.  Bilateral carotid atherosclerotic calcification noted.  IMPRESSION:  1.  No new fracture observed.  Bony bridging across the old fracture of the anterior base of C2 noted. 2.  Cervical spondylosis and degenerative disc disease likely contributing to multilevel impingement, but not thought to be acute. 3.  Today's imaging exclude the anterior inferior endplate of C7, and the T1 level.   Original Report Authenticated By: Dellia Cloud, M.D.    Ct Abdomen Pelvis W Contrast  05/18/2012  *RADIOLOGY REPORT*  Clinical Data: Fall.  History of cirrhosis  CT ABDOMEN AND PELVIS WITH CONTRAST  Technique:  Multidetector CT imaging of the  abdomen and pelvis was performed following the standard protocol during bolus administration of intravenous contrast.  Contrast: OMNIPAQUE IOHEXOL 300 MG/ML  SOLN  Comparison: 05/14/2012  Findings:  Atelectasis noted within the lung bases.  No pericardial or pleural effusion identified.  Morphologic features of the liver are identified.  No focal liver abnormality identified.  Gallbladder appears collapsed and increased in density likely reflecting vicarious excretion of contrast material.  No biliary dilatation.  The pancreas appears normal.  The  spleen is enlarged measuring 14 cm in length.  Both adrenal glands are normal.  Normal appearance of the kidneys. The urinary bladder appears normal.  Calcifications within an enlarged prostate gland identified.  Seminal vesicles appear normal.  No adenopathy identified within the abdomen or pelvis.  There is a marked amount of ascites identified within the abdomen and pelvis.  The volume is unchanged from previous examination. 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  There is recanalization of the umbilical vein.  The stomach appears normal.  The small bowel loops are unremarkable. Normal appearance of the colon  Review of the visualized bony structures is significant for mild multilevel spondylosis within the thoracic spine.  Wedge compression deformities involving the T12 and L1 vertebra are similar to prior exam. Stable left posterior rib fractures.  IMPRESSION:  1.  No acute findings and no significant change from 05/14/2012. 2.  Cirrhosis with portal venous hypertension. 3.  Stable T12 and L1 compression deformities.  4.  Stable fractures involving the left tenth and eleventh ribs.   Original Report Authenticated By: Rosealee Albee, M.D.    Dg Chest Port 1 View  05/18/2012  *RADIOLOGY REPORT*  Clinical Data: Fever and respiratory distress.  PORTABLE CHEST - 1 VIEW  Comparison: 10/19/2013at 0938 hours  Findings: Shallow  inspiration.  Borderline heart size with normal pulmonary vascularity.  Linear atelectasis in the left mid lung and right base.  No developing focal airspace disease.  No blunting of costophrenic angles.  No pneumothorax.  Mediastinal contours appear intact.  No significant change since previous study.  IMPRESSION: Shallow inspiration with linear atelectasis in the left mid lung and right base.  No significant change.   Original Report Authenticated By: Marlon Pel, M.D.     Medications: Scheduled Meds:    . sodium chloride   Intravenous STAT  . albuterol  2.5 mg Nebulization Q6H  . ceFEPime (MAXIPIME) IV  1 g Intravenous Q8H  . cloNIDine  0.1 mg Oral QHS  . enoxaparin (LOVENOX) injection  40 mg Subcutaneous Q24H  . Fluticasone-Salmeterol  1 puff Inhalation Q12H  . folic acid  1 mg Oral Daily  . furosemide  40 mg Intravenous BID  . haloperidol lactate  5 mg Intravenous Once  . haloperidol lactate  5 mg Intravenous Once  . lactulose  30 g Oral BID  . levofloxacin (LEVAQUIN) IV  750 mg Intravenous Q24H  . LORazepam  0-4 mg Intravenous Q6H   Followed by  . LORazepam  0-4 mg Intravenous Q12H  . multivitamin with minerals  1 tablet Oral Daily  . naloxone  0.4 mg Intravenous Once  . pantoprazole (PROTONIX) IV  40 mg Intravenous Q24H  . QUEtiapine  25 mg Oral BID  . sodium chloride  1,000 mL Intravenous Once  . sodium chloride  1,000 mL Intravenous Once  . sodium chloride  3 mL Intravenous Q12H  . sodium chloride      . sodium chloride      . thiamine  100 mg Oral Daily   Or  . thiamine  100 mg Intravenous Daily  . vancomycin  1,000 mg Intravenous Q8H  . DISCONTD: ciprofloxacin  400 mg Intravenous Q12H   Continuous Infusions:  PRN Meds:.acetaminophen, acetaminophen, albuterol, iohexol, LORazepam, LORazepam, ondansetron (ZOFRAN) IV, ondansetron, DISCONTD: ondansetron (ZOFRAN) IV  Assessment/Plan:  Principal Problem:  *Encephalopathy Active Problems:  HTN (hypertension)   COPD (chronic obstructive pulmonary disease)  Alcohol abuse  Macrocytic anemia  Alcohol withdrawal  Dyspnea  Ascites  Volume overload  Acute respiratory failure  Severe protein-calorie malnutrition  1. Encephalopathy. Possibly secondary to alcohol withdrawal syndrome/sepsis. Patient required transfer to the intensive care unit for closer monitoring overnight. Possible etiologies of AMS could be alcohol withdrawal syndrome vs. Sepsis.  Ammonia was normal. He was noted to have a fever last night, became acutely short of breath and had a significant increase in his WBC count. Will continue with Ativan per CIWA protocol, although if his agitation continues, he may need a continuous infusion.  He was also started on IV broad spectrum antibiotics and blood cultures were sent.  Will follow up. 2. Acute respiratory failure. Patient was complaining of shortness of breath yesterday.  Initially, it was thought that this may be due to volume overload.  His chest xray does not show any significant pulmonary edema and his bnp is only mildly elevated.  It is possible that the patient aspirated when he was found down. He is on antibiotics at this time. He does not have any significant wheezing.  We will continue with nebulizer treatments, advair and abx.  If his wheezing returns, then he may need to be started on steroids for COPD. 3. Sepsis vs. Sirs.  Patient developed fever, leukocytosis, hypotension and tachypnea overnight.  Chest xray does not reveal any significant findings and lactic acid still in normal range.  There is concern that he may have aspirated while he was found down.  He has been started on broad spectrum abx and blood cultures have been sent. Will follow up. 4. Ascites and volume overload. Patient started on IV lasix yesterday for worsening ascites and pedal edema.  It does not appear that he was taking any diuretics at home, since they were not on his home med list.  Although he remains edematous,  we will hold off on administering lasix for the time being due to concern for developing sepsis. GI has been consulted to evaluate for possible paracentesis, although we will likely have to hold off on this until his respiratory issues stabilize. 5. Alcohol abuse and withdrawal. Patient was placed on CIWA protocol. We will ask social work to see the patient to discuss alcohol abuse treatment options 6. COPD. Patient on advair and scheduled neb treatments. 7. Macrocytic anemia, from alcohol use, continue folic acid  8. Pseudomonas urinary tract infection. When patient was seen in the emergency room on 10/15 he had a positive urine culture for Pseudomonas. He was given one dose of Rocephin and per records, he was given a course of Keflex to be taken as an outpatient. I do not see Keflex on his home medication list, and am unclear whether he has been taking this. He has currently been placed on broad spectrum abx. Repeat urine culture. 9. Severe protein calorie malnutrition.  Likely due to liver disease.  May need albumin infusions once restarted on diuretics.  Patient status and plan of care was discussed with patient's son Amiir Wooding.  [name] has requested to be the main contact person for the family.  Time spent coordinating care: critical care:   LOS: 1 day   Yonis Carreon [facility] Pager: [phone] 05/19/2012, 9:44 AM

## 2012-05-19 NOTE — Progress Notes (Signed)
Patient's son Danieljoseph Ulven) called to check on patient, informed son that patient was moved to ICU, [name] asked that we call him first in regards to any changes in patient's status, note apply to chart to call him first, will call Gaynelle Adu in ICU to make aware as well.

## 2012-05-19 NOTE — Progress Notes (Signed)
Consent for paracentesis for tomorrow placed in chart. Pt unable to sign consent due to confusion and altered mental status. Pt brother Edman Lipsey called and verbal consent obtained via Tory Emerald, RN and Alto Denver, RN.

## 2012-05-20 ENCOUNTER — Inpatient Hospital Stay (HOSPITAL_COMMUNITY): Payer: Medicaid Other

## 2012-05-20 DIAGNOSIS — E876 Hypokalemia: Secondary | ICD-10-CM | POA: Diagnosis not present

## 2012-05-20 DIAGNOSIS — K7460 Unspecified cirrhosis of liver: Secondary | ICD-10-CM

## 2012-05-20 LAB — CBC
MCHC: 34.9 g/dL (ref 30.0–36.0)
MCV: 104.7 fL — ABNORMAL HIGH (ref 78.0–100.0)
Platelets: 140 10*3/uL — ABNORMAL LOW (ref 150–400)
RDW: 18.8 % — ABNORMAL HIGH (ref 11.5–15.5)
WBC: 8.2 10*3/uL (ref 4.0–10.5)

## 2012-05-20 LAB — COMPREHENSIVE METABOLIC PANEL
AST: 53 U/L — ABNORMAL HIGH (ref 0–37)
Albumin: 1.7 g/dL — ABNORMAL LOW (ref 3.5–5.2)
Chloride: 104 mEq/L (ref 96–112)
Creatinine, Ser: 0.88 mg/dL (ref 0.50–1.35)
Potassium: 3.1 mEq/L — ABNORMAL LOW (ref 3.5–5.1)
Total Bilirubin: 0.4 mg/dL (ref 0.3–1.2)
Total Protein: 6.2 g/dL (ref 6.0–8.3)

## 2012-05-20 LAB — BODY FLUID CELL COUNT WITH DIFFERENTIAL: Total Nucleated Cell Count, Fluid: 254 cu mm (ref 0–1000)

## 2012-05-20 LAB — URINE CULTURE

## 2012-05-20 LAB — PROTIME-INR
INR: 1.35 (ref 0.00–1.49)
Prothrombin Time: 16.4 seconds — ABNORMAL HIGH (ref 11.6–15.2)

## 2012-05-20 LAB — LEGIONELLA ANTIGEN, URINE

## 2012-05-20 MED ORDER — VANCOMYCIN HCL IN DEXTROSE 1-5 GM/200ML-% IV SOLN
1000.0000 mg | Freq: Two times a day (BID) | INTRAVENOUS | Status: DC
  Administered 2012-05-20: 1000 mg via INTRAVENOUS
  Filled 2012-05-20 (×2): qty 200

## 2012-05-20 MED ORDER — TRAZODONE HCL 50 MG PO TABS
50.0000 mg | ORAL_TABLET | Freq: Every evening | ORAL | Status: DC | PRN
  Administered 2012-05-20 – 2012-05-21 (×3): 50 mg via ORAL
  Filled 2012-05-20 (×4): qty 1

## 2012-05-20 MED ORDER — SPIRONOLACTONE 25 MG PO TABS
100.0000 mg | ORAL_TABLET | Freq: Two times a day (BID) | ORAL | Status: DC
  Administered 2012-05-21 – 2012-05-22 (×3): 100 mg via ORAL
  Filled 2012-05-20 (×3): qty 4

## 2012-05-20 MED ORDER — DEXTROSE 5 % IV SOLN
2.0000 g | Freq: Three times a day (TID) | INTRAVENOUS | Status: DC
  Administered 2012-05-20 – 2012-05-21 (×3): 2 g via INTRAVENOUS
  Filled 2012-05-20 (×4): qty 2

## 2012-05-20 MED ORDER — POTASSIUM CHLORIDE CRYS ER 20 MEQ PO TBCR
40.0000 meq | EXTENDED_RELEASE_TABLET | Freq: Every day | ORAL | Status: DC
  Administered 2012-05-20 – 2012-05-22 (×3): 40 meq via ORAL
  Filled 2012-05-20 (×3): qty 2

## 2012-05-20 NOTE — Progress Notes (Addendum)
Patient ID: Roberto [name], male   DOB: 16-Sep-1945, 66 y.o.   MRN: [id number] More alert today. Answer questions appropriately.  Abdomen is distended Filed Vitals:   05/20/12 0900 05/20/12 1000 05/20/12 1200 05/20/12 1300  BP: 100/73 112/77 133/83 129/71  Pulse: 137 79 77 41  Temp:      TempSrc:      Resp: 20 21 20 22   Height:      Weight:      SpO2: 99% 99% 100% 98%   Filed Vitals:   05/20/12 1300  BP: 129/71  Pulse: 41  Temp:   Resp: 22   Plan: Agree with present antibiotics. Further recommendations once cultures are back.    GI attending note; Patient underwent ultrasound-guided abdominal paracenteses with removal of 7.8 mL of clear yellow fluid. Cell count;WBC of 254 and 14% neutrophil. This count not consistent with SBP. Will continue furosemide at 40 mg by mouth daily and increase spironolactone to 100 mg by twice a day

## 2012-05-20 NOTE — Progress Notes (Signed)
[facility]             Progress Note   Subjective: Patient's mental status appears to be significantly improved this morning.  He is calm, able to answer questions, does not appear to be significantly confused.  He denies any shortness of breath or cough.  He feels neb treatments are helping him.  Objective: Vital signs in last 24 hours: Temp:  [97.8 F (36.6 C)-98.9 F (37.2 C)] 98.3 F (36.8 C) (10/21 0000) Pulse Rate:  [41-100] 83  (10/21 0800) Resp:  [16-34] 19  (10/21 0800) BP: (79-134)/(52-98) 117/98 mmHg (10/21 0800) SpO2:  [94 %-100 %] 100 % (10/21 0800) Weight:  [105.1 kg (231 lb 11.3 oz)] 105.1 kg (231 lb 11.3 oz) (10/21 0500) Weight change: 22.545 kg (49 lb 11.3 oz) Last BM Date: 05/19/12  Intake/Output from previous day: 10/20 0701 - 10/21 0700 In: 1640 [P.O.:840; IV Piggyback:800] Out: 1300 [Urine:1300] Total I/O In: 200 [P.O.:200] Out: -    Physical Exam: General: awake, does not appear to be in distress, calm HEENT: No bruits, no goiter. Heart: Regular rate and rhythm, without murmurs, rubs, gallops. Lungs: Clear to auscultation bilaterally. Abdomen: Soft, nontender, distended, positive bowel sounds. Extremities: 1-2+ edema b/l Neuro: Grossly intact, nonfocal.    Lab Results: Basic Metabolic Panel:  Basename 05/20/12 0454 05/19/12 0459  NA 137 137  K 3.1* 3.3*  CL 104 103  CO2 26 25  GLUCOSE 88 102*  BUN 10 8  CREATININE 0.88 0.88  CALCIUM 7.8* 7.6*  MG -- --  PHOS -- --   Liver Function Tests:  [facility] 05/20/12 0454 05/19/12 0459  AST 53* 46*  ALT 13 12  ALKPHOS 87 94  BILITOT 0.4 0.9  PROT 6.2 6.5  ALBUMIN 1.7* 1.8*   No results found for this basename: LIPASE:2,AMYLASE:2 in the last 72 hours  Basename 05/18/12 0831  AMMONIA 24   CBC:  Basename 05/20/12 0454 05/19/12 0459 05/18/12 0830  WBC 8.2 21.5* --  NEUTROABS -- -- 4.3  HGB 9.4* 10.2* --  HCT 26.9* 29.8* --  MCV 104.7* 106.4* --  PLT 140* 175 --    Cardiac Enzymes:  Basename 05/18/12 0830  CKTOTAL 111  CKMB --  CKMBINDEX --  TROPONINI --   BNP:  Basename 05/18/12 0830  PROBNP 420.7*   D-Dimer: No results found for this basename: DDIMER:2 in the last 72 hours CBG:  Basename 05/18/12 0805  GLUCAP 90   Hemoglobin A1C: No results found for this basename: HGBA1C in the last 72 hours Fasting Lipid Panel: No results found for this basename: CHOL,HDL,LDLCALC,TRIG,CHOLHDL,LDLDIRECT in the last 72 hours Thyroid Function Tests:  Basename 05/18/12 0830  TSH 0.905  T4TOTAL --  FREET4 --  T3FREE --  THYROIDAB --   Anemia Panel: No results found for this basename: VITAMINB12,FOLATE,FERRITIN,TIBC,IRON,RETICCTPCT in the last 72 hours Coagulation:  Basename 05/20/12 0454 05/18/12 0830  LABPROT 16.4* 14.7  INR 1.35 1.17   Urine Drug Screen: Drugs of Abuse     Component Value Date/Time   LABOPIA POSITIVE* 05/18/2012 1053   COCAINSCRNUR NONE DETECTED 05/18/2012 1053   LABBENZ POSITIVE* 05/18/2012 1053   AMPHETMU NONE DETECTED 05/18/2012 1053   THCU NONE DETECTED 05/18/2012 1053   LABBARB NONE DETECTED 05/18/2012 1053    Alcohol Level:  Basename 05/18/12 0830  ETH <11   Urinalysis:  Basename 05/18/12 1053  COLORURINE YELLOW  LABSPEC 1.010  PHURINE 7.5  GLUCOSEU NEGATIVE  HGBUR TRACE*  BILIRUBINUR NEGATIVE  KETONESUR NEGATIVE  PROTEINUR NEGATIVE  UROBILINOGEN 0.2  NITRITE NEGATIVE  LEUKOCYTESUR NEGATIVE    Recent Results (from the past 240 hour(s))  URINE CULTURE     Status: Normal   Collection Time   05/14/12  1:50 PM      Component Value Range Status Comment   Specimen Description URINE, CLEAN CATCH   Final    Special Requests NONE   Final    Culture  Setup Time 05/15/2012 01:46   Final    Colony Count >=100,000 COLONIES/ML   Final    Culture PSEUDOMONAS AERUGINOSA   Final    Report Status 05/16/2012 FINAL   Final    Organism ID, Bacteria PSEUDOMONAS AERUGINOSA   Final   CULTURE, BLOOD  (ROUTINE X 2)     Status: Normal (Preliminary result)   Collection Time   05/18/12 10:44 PM      Component Value Range Status Comment   Specimen Description Blood RIGHT ANTECUBITAL   Final    Special Requests BOTTLES DRAWN AEROBIC AND ANAEROBIC 7CC   Final    Culture NO GROWTH 1 DAY   Final    Report Status PENDING   Incomplete   CULTURE, BLOOD (ROUTINE X 2)     Status: Normal (Preliminary result)   Collection Time   05/18/12 10:48 PM      Component Value Range Status Comment   Specimen Description Blood BLOOD LEFT HAND   Final    Special Requests BOTTLES DRAWN AEROBIC AND ANAEROBIC 6CC   Final    Culture NO GROWTH 1 DAY   Final    Report Status PENDING   Incomplete   MRSA PCR SCREENING     Status: Normal   Collection Time   05/19/12  3:00 AM      Component Value Range Status Comment   MRSA by PCR NEGATIVE  NEGATIVE Final     Studies/Results: Dg Chest 1 View  05/18/2012  *RADIOLOGY REPORT*  Clinical Data: Fall.  CHEST - 1 VIEW  Comparison: 04/16/2012  Findings: Subsegmental atelectasis noted in the right lower lobe and left mid lung. The patient is rotated to the right on today's exam, resulting in reduced diagnostic sensitivity and specificity. Heart size within normal limits given the projection and rotation. No pneumothorax observed.  IMPRESSION:  1. Subsegmental subsegmental atelectasis in the right lower lobe and left mid lung.   Original Report Authenticated By: Dellia Cloud, M.D.    Dg Forearm Right  05/18/2012  *RADIOLOGY REPORT*  Clinical Data: Fall.  RIGHT FOREARM - 2 VIEW  Comparison: None.  Findings: Mildly irregular spurring noted along the capitellum.  No right forearm fracture is identified.  IMPRESSION:  1.  Mild spurring along the capitellum.  No forearm fracture identified.   Original Report Authenticated By: Dellia Cloud, M.D.    Dg Chest Port 1 View  05/18/2012  *RADIOLOGY REPORT*  Clinical Data: Fever and respiratory distress.  PORTABLE CHEST - 1  VIEW  Comparison: 10/19/2013at 0938 hours  Findings: Shallow inspiration.  Borderline heart size with normal pulmonary vascularity.  Linear atelectasis in the left mid lung and right base.  No developing focal airspace disease.  No blunting of costophrenic angles.  No pneumothorax.  Mediastinal contours appear intact.  No significant change since previous study.  IMPRESSION: Shallow inspiration with linear atelectasis in the left mid lung and right base.  No significant change.   Original Report Authenticated By: Marlon Pel, M.D.     Medications: Scheduled Meds:    .  albuterol  2.5 mg Nebulization Q6H  . ceFEPime (MAXIPIME) IV  2 g Intravenous Q8H  . Fluticasone-Salmeterol  1 puff Inhalation Q12H  . folic acid  1 mg Oral Daily  . furosemide  40 mg Oral Daily  . lactulose  30 g Oral BID  . levofloxacin (LEVAQUIN) IV  750 mg Intravenous Q24H  . LORazepam  0-4 mg Intravenous Q6H   Followed by  . LORazepam  0-4 mg Intravenous Q12H  . multivitamin with minerals  1 tablet Oral Daily  . nicotine  21 mg Transdermal Daily  . pantoprazole (PROTONIX) IV  40 mg Intravenous Q24H  . QUEtiapine  25 mg Oral BID  . sodium chloride  3 mL Intravenous Q12H  . spironolactone  100 mg Oral Daily  . thiamine  100 mg Oral Daily   Or  . thiamine  100 mg Intravenous Daily  . vancomycin  1,000 mg Intravenous Q12H  . DISCONTD: ceFEPime (MAXIPIME) IV  1 g Intravenous Q8H  . DISCONTD: cloNIDine  0.1 mg Oral QHS  . DISCONTD: enoxaparin (LOVENOX) injection  40 mg Subcutaneous Q24H  . DISCONTD: furosemide  40 mg Intravenous BID  . DISCONTD: vancomycin  1,000 mg Intravenous Q8H   Continuous Infusions:  PRN Meds:.acetaminophen, acetaminophen, albuterol, LORazepam, LORazepam, ondansetron (ZOFRAN) IV, ondansetron, traZODone  Assessment/Plan:  Principal Problem:  *Encephalopathy Active Problems:  HTN (hypertension)  COPD (chronic obstructive pulmonary disease)  Alcohol abuse  Macrocytic anemia  Alcohol  withdrawal  Dyspnea  Ascites  Volume overload  Acute respiratory failure  Severe protein-calorie malnutrition  Sepsis  1. Encephalopathy. Possibly secondary to alcohol withdrawal syndrome/sepsis. Patient was initially admitted to the medical floor, but required transfer to the intensive care unit for closer monitoring. Possible etiologies of AMS could be alcohol withdrawal syndrome vs. Sepsis.  Ammonia was normal. His mental status has significantly improved and appears to be returning to baseline. I suspect that alcohol withdrawal was the main culprit, which has since improved. He was started on antibiotics yesterday, but I would not expect a rapid improvement in mental status overnight if etiology was an infectious process. 2. Acute respiratory failure. This may have been related to his alcohol withdrawal. His chest xray has been unremarkable, he does not have any significant wheezing, and it appears that his respiratory status has also significantly improved since yesterday.  Will keep on antibiotics for now, since there is a possibility he may have aspirated, but my suspicion for a pulmonary infection is low. Continue to wean oxygen off as tolerated. 3. Sepsis vs. Sirs.  Patient developed fever, leukocytosis, hypotension and tachypnea.  Chest xray did not reveal any significant findings and lactic acid was in normal range.  Blood cultures have shown no growth.  He is on broad spectrum antibiotics. His leukocytosis has normalized today.  It is unlikely that the elevated wbc count was secondary to an infectious process. Will continue antibiotics now and monitor for further clinical improvement.  If blood cultures remain negative tomorrow, then I think his antibiotics can be de-escalated. 4. Ascites and volume overload. Patient seen by GI, appreciate assistance. He is on lasix and aldactone. There is concern for development of spontaneous bacterial peritonitis.  He is on schedule for a paracentesis  today. 5. Alcohol abuse and withdrawal. Patient is on CIWA protocol. We will ask social work to see the patient to discuss alcohol abuse treatment options 6. COPD. Patient on advair and scheduled neb treatments. 7. Macrocytic anemia, from alcohol use, continue folic acid  8. Pseudomonas urinary tract infection. When patient was seen in the emergency room on 10/15 he had a positive urine culture for Pseudomonas. He was given one dose of Rocephin and per records, he was given a course of Keflex to be taken as an outpatient. I do not see Keflex on his home medication list, and am unclear whether he has been taking this. He has currently been placed on broad spectrum abx. Repeat urine culture. 9. Severe protein calorie malnutrition.  Likely due to liver disease.  10. Hypokalemia, replete   Left voicemail for patient's son Denzel Etienne.  Patient can likely transfer to telemetry today  Time spent coordinating care:   LOS: 2 days   Laquan Beier [facility] Pager: [phone] 05/20/2012, 9:56 AM

## 2012-05-20 NOTE — Clinical Social Work Note (Signed)
Patient undergoing procedure, unable to assess, will return tomorrow.  Santa Genera, LCSW Clinical Social Worker [phone])

## 2012-05-20 NOTE — Progress Notes (Signed)
Patient has been very restless this afternoon, keeps getting out of bed and trying to leave. Medicated with Ativan, bed alarm in place. Will continue to monitor patient closely.

## 2012-05-20 NOTE — Progress Notes (Signed)
Lidocaine 1% 7ml given by MD for paracentesis in RLQ. 7780 ml abdominal fluid removed.

## 2012-05-20 NOTE — Progress Notes (Signed)
Patient taken down for paracentesis.

## 2012-05-20 NOTE — Procedures (Signed)
PreOperative Dx: Cirrhosis, ascites Postoperative Dx: Cirrhosis, ascites Procedure:   US guided paracentesis Radiologist:  Tyron Russell Anesthesia:  6 ml of 1% lidocaone Specimen:  7880 ml of clear yellow fluid EBL:   < 1 ml Complications: None

## 2012-05-20 NOTE — Progress Notes (Signed)
ANTIBIOTIC CONSULT NOTE  Pharmacy Consult for vancomycin, levofloxacin, and cefepime Indication: suspected pneumonia, SBP  No Known Allergies  Patient Measurements: Height: 6\' 1"  (185.4 cm) Weight: 231 lb 11.3 oz (105.1 kg) IBW/kg (Calculated) : 79.9    Vital Signs: Temp: 98.3 F (36.8 C) (10/21 0000) Temp src: Axillary (10/21 0000) BP: 117/98 mmHg (10/21 0800) Pulse Rate: 83  (10/21 0800) Intake/Output from previous day: 10/20 0701 - 10/21 0700 In: 1640 [P.O.:840; IV Piggyback:800] Out: 1300 [Urine:1300] Intake/Output from this shift: Total I/O In: 200 [P.O.:200] Out: -   Labs:  Basename 05/20/12 0454 05/19/12 0459 05/18/12 0830  WBC 8.2 21.5* 6.7  HGB 9.4* 10.2* 11.3*  PLT 140* 175 205  LABCREA -- -- --  CREATININE 0.88 0.88 0.77   Estimated Creatinine Clearance: 109.4 ml/min (by C-G formula based on Cr of 0.88).  Basename 05/20/12 0625  VANCOTROUGH 23.6*  VANCOPEAK --  Drue Dun --  GENTTROUGH --  GENTPEAK --  GENTRANDOM --  TOBRATROUGH --  TOBRAPEAK --  TOBRARND --  AMIKACINPEAK --  AMIKACINTROU --  AMIKACIN --     Microbiology: Recent Results (from the past 720 hour(s))  URINE CULTURE     Status: Normal   Collection Time   05/14/12  1:50 PM      Component Value Range Status Comment   Specimen Description URINE, CLEAN CATCH   Final    Special Requests NONE   Final    Culture  Setup Time 05/15/2012 01:46   Final    Colony Count >=100,000 COLONIES/ML   Final    Culture PSEUDOMONAS AERUGINOSA   Final    Report Status 05/16/2012 FINAL   Final    Organism ID, Bacteria PSEUDOMONAS AERUGINOSA   Final   CULTURE, BLOOD (ROUTINE X 2)     Status: Normal (Preliminary result)   Collection Time   05/18/12 10:44 PM      Component Value Range Status Comment   Specimen Description Blood RIGHT ANTECUBITAL   Final    Special Requests BOTTLES DRAWN AEROBIC AND ANAEROBIC 7CC   Final    Culture NO GROWTH 1 DAY   Final    Report Status PENDING   Incomplete     CULTURE, BLOOD (ROUTINE X 2)     Status: Normal (Preliminary result)   Collection Time   05/18/12 10:48 PM      Component Value Range Status Comment   Specimen Description Blood BLOOD LEFT HAND   Final    Special Requests BOTTLES DRAWN AEROBIC AND ANAEROBIC 6CC   Final    Culture NO GROWTH 1 DAY   Final    Report Status PENDING   Incomplete   MRSA PCR SCREENING     Status: Normal   Collection Time   05/19/12  3:00 AM      Component Value Range Status Comment   MRSA by PCR NEGATIVE  NEGATIVE Final     Medical History: Past Medical History  Diagnosis Date  . COPD (chronic obstructive pulmonary disease)   . Hypertension   . Hepatic steatosis 12/14/2011  . Compression fracture of L1 lumbar vertebra 12/14/2011  . C2 cervical fracture   . Duodenal ulcer with hemorrhage 12/15/2011    Likely source of bleeding.per EGD; Dr. Karilyn Cota  . Esophageal varices without bleeding 12/15/2011    per EGD  . Cirrhosis 12/16/2011    per ultrasound  . Hx of substance abuse 12/17/2011  . 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 Hepatic encephalopathy   .  Helicobacter pylori antibody positive 12/19/2011    Medications:  Scheduled:     . albuterol  2.5 mg Nebulization Q6H  . ceFEPime (MAXIPIME) IV  2 g Intravenous Q8H  . Fluticasone-Salmeterol  1 puff Inhalation Q12H  . folic acid  1 mg Oral Daily  . furosemide  40 mg Oral Daily  . lactulose  30 g Oral BID  . levofloxacin (LEVAQUIN) IV  750 mg Intravenous Q24H  . LORazepam  0-4 mg Intravenous Q6H   Followed by  . LORazepam  0-4 mg Intravenous Q12H  . multivitamin with minerals  1 tablet Oral Daily  . nicotine  21 mg Transdermal Daily  . pantoprazole (PROTONIX) IV  40 mg Intravenous Q24H  . QUEtiapine  25 mg Oral BID  . sodium chloride  3 mL Intravenous Q12H  . spironolactone  100 mg Oral Daily  . thiamine  100 mg Oral Daily   Or  . thiamine  100 mg Intravenous Daily  . vancomycin  1,000 mg Intravenous Q12H  .  DISCONTD: ceFEPime (MAXIPIME) IV  1 g Intravenous Q8H  . DISCONTD: cloNIDine  0.1 mg Oral QHS  . DISCONTD: enoxaparin (LOVENOX) injection  40 mg Subcutaneous Q24H  . DISCONTD: furosemide  40 mg Intravenous BID  . DISCONTD: vancomycin  1,000 mg Intravenous Q8H   Assessment: Roberto [name] is a 66 yo male with suspected pneumonia admitted after a fall this morning and being confused and lethargic in the ED.  He has a history of alcohol abuse and is currently on CIWA protocol.   Of note- Pt had positive urine Cx on 10/15 for Psudomonas aeruginosa. Cefepime was increased yesterday for possible SBP.   Pt is currently afebrile and WBC has normalized on current regimen. Renal function has been stable. Cx data pending.  Vancomycin trough level above desired goal range.  Goal of Therapy:  Vancomycin trough level 15-20 mcg/ml  Plan:  1) Decrease Vancomycin 1 gram IV Q12 hours  2) Continue Cefepime 2 gram IV Q 8 hours  3) Continue Levofloxacin 750 mg IV Q 24 hours.   4) Weekly Vancomycin trough while on Vanc 5) Monitor renal function and cx data   Thank you,   Filipe Greathouse, [facility] 05/20/2012,8:32 AM

## 2012-05-21 LAB — BASIC METABOLIC PANEL
BUN: 8 mg/dL (ref 6–23)
Chloride: 104 mEq/L (ref 96–112)
GFR calc Af Amer: >90 mL/min (ref >90)
GFR calc non Af Amer: 89 mL/min — ABNORMAL LOW (ref >90)
Potassium: 3.6 mEq/L (ref 3.5–5.1)
Sodium: 138 mEq/L (ref 135–145)

## 2012-05-21 LAB — CBC
HCT: 29.6 % — ABNORMAL LOW (ref 39.0–52.0)
Hemoglobin: 10.2 g/dL — ABNORMAL LOW (ref 13.0–17.0)
MCHC: 34.5 g/dL (ref 30.0–36.0)
RBC: 2.86 MIL/uL — ABNORMAL LOW (ref 4.22–5.81)
WBC: 7.2 10*3/uL (ref 4.0–10.5)

## 2012-05-21 MED ORDER — TUBERCULIN PPD 5 UNIT/0.1ML ID SOLN
5.0000 [IU] | Freq: Once | INTRADERMAL | Status: AC
  Administered 2012-05-21: 5 [IU] via INTRADERMAL
  Filled 2012-05-21: qty 0.1

## 2012-05-21 MED ORDER — HALOPERIDOL LACTATE 5 MG/ML IJ SOLN
5.0000 mg | Freq: Four times a day (QID) | INTRAMUSCULAR | Status: DC | PRN
Start: 2012-05-21 — End: 2012-05-22

## 2012-05-21 MED ORDER — LORAZEPAM 0.5 MG PO TABS
2.0000 mg | ORAL_TABLET | Freq: Four times a day (QID) | ORAL | Status: DC
  Administered 2012-05-21 – 2012-05-22 (×3): 2 mg via ORAL
  Filled 2012-05-21 (×3): qty 4

## 2012-05-21 MED ORDER — LORAZEPAM 2 MG/ML IJ SOLN
1.0000 mg | Freq: Four times a day (QID) | INTRAMUSCULAR | Status: DC | PRN
  Administered 2012-05-21: 2 mg via INTRAVENOUS
  Filled 2012-05-21 (×2): qty 1

## 2012-05-21 MED ORDER — BIOTENE DRY MOUTH MT LIQD
15.0000 mL | Freq: Two times a day (BID) | OROMUCOSAL | Status: DC
  Administered 2012-05-21 – 2012-05-22 (×3): 15 mL via OROMUCOSAL

## 2012-05-21 NOTE — Plan of Care (Signed)
Problem: Phase II Progression Outcomes Goal: Progress activity as tolerated unless otherwise ordered Outcome: Progressing Up to side of bed, unsteady.  Fall Risk Goal: Discharge plan established Outcome: Progressing Lives at home with mother, Windell Moulding

## 2012-05-21 NOTE — Progress Notes (Signed)
Incident this am, patient woke up, confused and combative trying get out of bed.  Assisted onto bedside commode, High fall risk.  Insisting he is going home.  Threatening to staff.  Patient stated he was going to "kick my ass".  Security called, a threat to be harmed by hitting.  Ativan retrieved and patient safely returned to bed when he viewed security as the police department.  4mg  IV ativan given. Vitals remain stable for this patient

## 2012-05-21 NOTE — Progress Notes (Signed)
[facility]             Progress Note   Subjective: Patient became agitated and confused earlier this morning. He was trying to leave and became combative. Security had to be called. He was given Ativan and has since calmed down. He does recall trying to leave earlier this morning. He does not appear agitated at this time. He wants to go home. He denies any complaints.  Objective: Vital signs in last 24 hours: Temp:  [97.4 F (36.3 C)-98.7 F (37.1 C)] 98.2 F (36.8 C) (10/22 0839) Pulse Rate:  [41-96] 80  (10/22 0700) Resp:  [20-29] 25  (10/22 0600) BP: (110-139)/(65-84) 117/81 mmHg (10/22 0600) SpO2:  [93 %-100 %] 95 % (10/22 0849) FiO2 (%):  [21 %] 21 % (10/22 0849) Weight:  [97.3 kg (214 lb 8.1 oz)] 97.3 kg (214 lb 8.1 oz) (10/22 0439) Weight change: -7.8 kg (-17 lb 3.1 oz) Last BM Date: 05/19/12  Intake/Output from previous day: 10/21 0701 - 10/22 0700 In: 1393 [P.O.:690; I.V.:3; IV Piggyback:700] Out: 4700 [Urine:4700] Total I/O In: 120 [P.O.:120] Out: -    Physical Exam: General: awake, does not appear to be in distress, calm HEENT: No bruits, no goiter. Heart: Regular rate and rhythm, without murmurs, rubs, gallops. Lungs: Clear to auscultation bilaterally. Abdomen: Soft, nontender, distended but improved, positive bowel sounds. Extremities: 1-2+ edema b/l Neuro: Grossly intact, nonfocal.    Lab Results: Basic Metabolic Panel:  Basename 05/21/12 0432 05/20/12 0454  NA 138 137  K 3.6 3.1*  CL 104 104  CO2 28 26  GLUCOSE 91 88  BUN 8 10  CREATININE 0.89 0.88  CALCIUM 7.8* 7.8*  MG -- --  PHOS -- --   Liver Function Tests:  Basename 05/20/12 0454 05/19/12 0459  AST 53* 46*  ALT 13 12  ALKPHOS 87 94  BILITOT 0.4 0.9  PROT 6.2 6.5  ALBUMIN 1.7* 1.8*   No results found for this basename: LIPASE:2,AMYLASE:2 in the last 72 hours No results found for this basename: AMMONIA:2 in the last 72 hours CBC:  Basename 05/21/12 0432 05/20/12  0454  WBC 7.2 8.2  NEUTROABS -- --  HGB 10.2* 9.4*  HCT 29.6* 26.9*  MCV 103.5* 104.7*  PLT 135* 140*   Cardiac Enzymes: No results found for this basename: CKTOTAL:3,CKMB:3,CKMBINDEX:3,TROPONINI:3 in the last 72 hours BNP: No results found for this basename: PROBNP:3 in the last 72 hours D-Dimer: No results found for this basename: DDIMER:2 in the last 72 hours CBG: No results found for this basename: GLUCAP:6 in the last 72 hours Hemoglobin A1C: No results found for this basename: HGBA1C in the last 72 hours Fasting Lipid Panel: No results found for this basename: CHOL,HDL,LDLCALC,TRIG,CHOLHDL,LDLDIRECT in the last 72 hours Thyroid Function Tests: No results found for this basename: TSH,T4TOTAL,FREET4,T3FREE,THYROIDAB in the last 72 hours Anemia Panel: No results found for this basename: VITAMINB12,FOLATE,FERRITIN,TIBC,IRON,RETICCTPCT in the last 72 hours Coagulation:  Basename 05/20/12 0454  LABPROT 16.4*  INR 1.35   Urine Drug Screen: Drugs of Abuse     Component Value Date/Time   LABOPIA POSITIVE* 05/18/2012 1053   COCAINSCRNUR NONE DETECTED 05/18/2012 1053   LABBENZ POSITIVE* 05/18/2012 1053   AMPHETMU NONE DETECTED 05/18/2012 1053   THCU NONE DETECTED 05/18/2012 1053   LABBARB NONE DETECTED 05/18/2012 1053    Alcohol Level: No results found for this basename: ETH:2 in the last 72 hours Urinalysis:  Basename 05/18/12 1053  COLORURINE YELLOW  LABSPEC 1.010  PHURINE 7.5  GLUCOSEU NEGATIVE  HGBUR TRACE*  BILIRUBINUR NEGATIVE  KETONESUR NEGATIVE  PROTEINUR NEGATIVE  UROBILINOGEN 0.2  NITRITE NEGATIVE  LEUKOCYTESUR NEGATIVE    Recent Results (from the past 240 hour(s))  URINE CULTURE     Status: Normal   Collection Time   05/14/12  1:50 PM      Component Value Range Status Comment   Specimen Description URINE, CLEAN CATCH   Final    Special Requests NONE   Final    Culture  Setup Time 05/15/2012 01:46   Final    Colony Count >=100,000 COLONIES/ML    Final    Culture PSEUDOMONAS AERUGINOSA   Final    Report Status 05/16/2012 FINAL   Final    Organism ID, Bacteria PSEUDOMONAS AERUGINOSA   Final   CULTURE, BLOOD (ROUTINE X 2)     Status: Normal (Preliminary result)   Collection Time   05/18/12 10:44 PM      Component Value Range Status Comment   Specimen Description Blood RIGHT ANTECUBITAL   Final    Special Requests BOTTLES DRAWN AEROBIC AND ANAEROBIC 7CC   Final    Culture NO GROWTH 2 DAYS   Final    Report Status PENDING   Incomplete   CULTURE, BLOOD (ROUTINE X 2)     Status: Normal (Preliminary result)   Collection Time   05/18/12 10:48 PM      Component Value Range Status Comment   Specimen Description Blood BLOOD LEFT HAND   Final    Special Requests BOTTLES DRAWN AEROBIC AND ANAEROBIC 6CC   Final    Culture NO GROWTH 2 DAYS   Final    Report Status PENDING   Incomplete   MRSA PCR SCREENING     Status: Normal   Collection Time   05/19/12  3:00 AM      Component Value Range Status Comment   MRSA by PCR NEGATIVE  NEGATIVE Final   URINE CULTURE     Status: Normal   Collection Time   05/19/12 11:34 AM      Component Value Range Status Comment   Specimen Description URINE, CATHETERIZED   Final    Special Requests NONE   Final    Culture  Setup Time 05/19/2012 23:17   Final    Colony Count NO GROWTH   Final    Culture NO GROWTH   Final    Report Status 05/20/2012 FINAL   Final   BODY FLUID CULTURE     Status: Normal (Preliminary result)   Collection Time   05/20/12 11:45 AM      Component Value Range Status Comment   Specimen Description FLUID ASCITIC   Final    Special Requests NONE   Final    Gram Stain NWBC NO ORGANISMS SEEN   Final    Culture PENDING   Incomplete    Report Status PENDING   Incomplete     Studies/Results: US Paracentesis  05/20/2012  *RADIOLOGY REPORT*  ULTRASOUND GUIDED PARACENTESIS:  Clinical Data:  Cirrhosis, ascites  Technique: After explanation of procedure, benefits, and risks, written  informed consent was obtained. Time-out protocol was followed. Collection of ascites in right lower quadrant localized by ultrasound. Skin prepped and draped in usual sterile fashion. Skin and soft tissues anesthestized with 6 ml of 1% lidocaine. 5-French Yueh catheter placed into peritoneal cavity. 7880 ml of clear yellow fluid aspirated by vacuum pump suction. Procedure tolerated well by patient without immediate complication. Fluid sent to laboratory for requested  analysis.  IMPRESSION: Ultrasound-guided paracentesis of 7880 ml of ascitic fluid.   Original Report Authenticated By: Lollie Marrow, M.D.     Medications: Scheduled Meds:    . albuterol  2.5 mg Nebulization Q6H  . antiseptic oral rinse  15 mL Mouth Rinse BID  . ceFEPime (MAXIPIME) IV  2 g Intravenous Q8H  . Fluticasone-Salmeterol  1 puff Inhalation Q12H  . folic acid  1 mg Oral Daily  . furosemide  40 mg Oral Daily  . lactulose  30 g Oral BID  . levofloxacin (LEVAQUIN) IV  750 mg Intravenous Q24H  . LORazepam  0-4 mg Intravenous Q6H   Followed by  . LORazepam  0-4 mg Intravenous Q12H  . multivitamin with minerals  1 tablet Oral Daily  . nicotine  21 mg Transdermal Daily  . pantoprazole (PROTONIX) IV  40 mg Intravenous Q24H  . potassium chloride  40 mEq Oral Daily  . QUEtiapine  25 mg Oral BID  . sodium chloride  3 mL Intravenous Q12H  . spironolactone  100 mg Oral BID  . thiamine  100 mg Oral Daily   Or  . thiamine  100 mg Intravenous Daily  . vancomycin  1,000 mg Intravenous Q12H  . DISCONTD: ceFEPime (MAXIPIME) IV  2 g Intravenous Q8H  . DISCONTD: spironolactone  100 mg Oral Daily   Continuous Infusions:  PRN Meds:.acetaminophen, acetaminophen, albuterol, LORazepam, LORazepam, ondansetron (ZOFRAN) IV, ondansetron, traZODone  Assessment/Plan:  Principal Problem:  *Encephalopathy Active Problems:  HTN (hypertension)  COPD (chronic obstructive pulmonary disease)  Alcohol abuse  Macrocytic anemia  Alcohol  withdrawal  Dyspnea  Ascites  Volume overload  Acute respiratory failure  Severe protein-calorie malnutrition  Sepsis  Hypokalemia  This 66 year old gentleman with a history of heavy alcohol use, was brought to the emergency room and he was found down for an unknown period of time at home. Vomit was found on his face and pressure. Patient was confused and combative on arrival to the emergency room. He was admitted to the hospital for further evaluation treatment  1. Encephalopathy. Likely due to alcohol withdrawal. Patient was initially admitted to the medical floor, but required transfer to the intensive care unit for closer monitoring. Ammonia was normal. His mental status has significantly improved and appears to be returning to baseline. He still becomes agitated and requires Ativan. His mental status improved without any significant antibiotic use. Therefore I am doubtful that his change in mental status was secondary to an infectious cause.  2. Acute respiratory failure. This may have been related to his alcohol withdrawal. His chest xray has been unremarkable, he does not have any significant wheezing, and it appears that his respiratory status has also improved back to baseline.  It is possible that he had some aspiration when he was found down. His chest x-ray is unremarkable, and clinically he appears back to his baseline respiratory status. We will discontinue antibiotics.  3. Sepsis vs. Sirs.  Patient developed fever, leukocytosis, hypotension and tachypnea.  Chest xray did not reveal any significant findings and lactic acid was in normal range.  Blood cultures have shown no growth to date.  He is on broad spectrum antibiotics. His leukocytosis has normalized.  It is unlikely that the elevated wbc count was secondary to an infectious process. We will discontinue antibiotics today.  4. Ascites and volume overload. Patient seen by GI, appreciate assistance. He is on lasix and aldactone. He  underwent paracentesis yesterday with removal of 7.8 L of fluid.  Fluid analysis was reviewed by Dr. Karilyn Cota, and it did not appear that it was consistent with spontaneous bacterial peritonitis. He is continued on diuretics. Further recommendations per GI 5. Alcohol abuse and withdrawal. Patient is on CIWA protocol. We will ask social work to see the patient to discuss alcohol abuse treatment options 6. COPD. Patient on advair and scheduled neb treatments. 7. Macrocytic anemia, from alcohol use, continue folic acid  8. Pseudomonas urinary tract infection. When patient was seen in the emergency room on 10/15 he had a positive urine culture for Pseudomonas. He was given one dose of Rocephin and per records, he was given a course of Keflex to be taken as an outpatient. I do not see Keflex on his home medication list, and am unclear whether he had been taking this.Repeat urine culture showed no growth.  It appears that this has been adequately treated. 9. Severe protein calorie malnutrition.  Likely due to liver disease.  10. Hypokalemia, replete 11. Disposition. Patient be transferred out of the intensive care units a day to the regular floor. Nursing staff reports that he is very unsteady on his feet. We will ask for physical therapy evaluation to assess the level of therapy he will require post discharge. His family has concerns about him returning home regarding both his safety and his mother safety. Patient does often becomes confused at times and this appears to be his baseline. If he does qualify for skilled nursing facility, may need to have a capacity evaluation. Anticipate discharge in the next one to 2 days.   Time spent coordinating care:   LOS: 3 days   MEMON,JEHANZEB [facility] Pager: [phone] 05/21/2012, 10:43 AM

## 2012-05-21 NOTE — Evaluation (Signed)
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 At this time he is ambulating and completing mobility at supervision level with a RW.  His berg score is a 34/56.  recommend ambulating with a RW at home and recommend OP PT to improve balance.     PT Assessment       Follow Up Recommendations  Outpatient PT    Does the patient have the potential to tolerate intense rehabilitation      Barriers to Discharge        Equipment Recommendations  Tub/shower seat;None recommended by PT    Recommendations for Other Services     Frequency      Precautions / Restrictions Precautions Precautions: Fall   Pertinent Vitals/Pain none      Mobility  Transfers Transfers: Sit to Stand;Stand to Sit Sit to Stand: 6: Modified independent (Device/Increase time) Stand to Sit: 6: Modified independent (Device/Increase time) Details for Transfer Assistance: uncontrolled descent to lowered surface.  Ambulation/Gait Ambulation/Gait Assistance: 5: Supervision Ambulation Distance (Feet): 250 Feet Assistive device: Rolling walker Gait velocity: decreased  General Gait Details: staggering to the Right side (may be due to unable to find glasses)     Shoulder Instructions     Exercises     PT Diagnosis:    PT Problem List:   PT Treatment Interventions:     PT Goals    Visit Information  Last PT Received On: 05/21/12 Assistance Needed: +1 PT/OT Co-Evaluation/Treatment: Yes    Subjective Data      Prior Functioning  Home Living Lives With: Son;Other (Comment) (Mother) Available Help at Discharge: Family;Available 24 hours/day Type of Home: Apartment Home Access: Level entry Home  Layout: One level Bathroom Shower/Tub: Engineer, manufacturing systems: Standard Home Adaptive Equipment: Walker - rolling Additional Comments: patient states that he didn't use the walker all the time, but if it was available he would. 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 Dominant Hand: Right    Cognition  Overall Cognitive Status: History of cognitive impairments - at baseline Arousal/Alertness: Awake/alert Orientation Level: Person;Place Behavior During Session: Restless    Extremity/Trunk Assessment Right Upper Extremity Assessment RUE ROM/Strength/Tone: Within functional levels RUE Sensation: WFL - Light Touch RUE Coordination: WFL - gross/fine motor Left Upper Extremity Assessment LUE ROM/Strength/Tone: Within functional levels LUE Sensation: WFL - Light Touch LUE Coordination: WFL - gross/fine motor Right Lower Extremity Assessment RLE ROM/Strength/Tone: WFL for tasks assessed RLE Sensation: WFL - Light Touch RLE Coordination: WFL - gross/fine motor Left Lower Extremity Assessment LLE ROM/Strength/Tone: WFL for tasks assessed LLE Sensation: WFL - Light Touch LLE Coordination: WFL - gross/fine motor   Balance Standardized Balance Assessment Standardized Balance Assessment: Berg Balance Test Berg Balance Test Sit to Stand: Able to stand  independently using hands Standing Unsupported: Able to stand 2 minutes with supervision Sitting with Back Unsupported but Feet Supported on Floor or Stool: Able to sit safely and securely 2 minutes Stand to Sit: Uses backs of legs against chair to control descent Transfers: Able to transfer safely, definite need of hands Standing Unsupported with Eyes Closed: Able to stand 10 seconds with  supervision Standing Ubsupported with Feet Together: Able to place feet together  independently and stand for 1 minute with supervision From Standing, Reach Forward with Outstretched Arm: Can reach forward >12 cm safely (5") From Standing Position, Pick up Object from Floor: Able to pick up shoe, needs supervision From Standing Position, Turn to Look Behind Over each Shoulder: Looks behind one side only/other side shows less weight shift Turn 360 Degrees: Able to turn 360 degrees safely but slowly Standing Unsupported, Alternately Place Feet on Step/Stool: Able to complete >2 steps/needs minimal assist Standing Unsupported, One Foot in Front: Needs help to step but can hold 15 seconds Standing on One Leg: Unable to try or needs assist to prevent fall Total Score: 34   End of Session PT - End of Session Equipment Utilized During Treatment: Gait belt Activity Tolerance: Patient tolerated treatment well Patient left: in chair;with call bell/phone within reach;with chair alarm set Nurse Communication: Mobility status     Tavia Stave, PT 05/21/2012, 4:01 PM

## 2012-05-21 NOTE — Progress Notes (Signed)
Tele psych complete. MD notified and given papers faxed back from [facility]. New orders received. Will continue to monitor.

## 2012-05-21 NOTE — Progress Notes (Signed)
TB skin test placed in RIGHT forearm and circled with black marker. 

## 2012-05-21 NOTE — Progress Notes (Signed)
Pt up in chair. Pt ambulated in hallway >1106ft with walker with physical therapy and tolerated well. Pt agitated and saying "I am going home now and you can't stop me." Pt received IV Ativan 1 mg at 1100. MD paged and notified. New orders received.  At 1345 family member at bedside trying to calm pt down. Will continue to monitor.
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
## 2012-05-22 ENCOUNTER — Encounter (HOSPITAL_COMMUNITY): Payer: Self-pay | Admitting: Internal Medicine

## 2012-05-22 LAB — BASIC METABOLIC PANEL
BUN: 8 mg/dL (ref 6–23)
Calcium: 8.2 mg/dL — ABNORMAL LOW (ref 8.4–10.5)
Chloride: 103 mEq/L (ref 96–112)
Creatinine, Ser: 0.88 mg/dL (ref 0.50–1.35)
GFR calc Af Amer: >90 mL/min (ref >90)
Potassium: 3.8 mEq/L (ref 3.5–5.1)
Sodium: 137 mEq/L (ref 135–145)

## 2012-05-22 LAB — CBC
Hemoglobin: 10.8 g/dL — ABNORMAL LOW (ref 13.0–17.0)
MCHC: 34.7 g/dL (ref 30.0–36.0)
RBC: 2.99 MIL/uL — ABNORMAL LOW (ref 4.22–5.81)

## 2012-05-22 MED ORDER — SPIRONOLACTONE 100 MG PO TABS: 100.0000 mg | ORAL_TABLET | Freq: Two times a day (BID) | ORAL | Status: DC

## 2012-05-22 MED ORDER — LACTULOSE 10 GM/15ML PO SOLN: 30.0000 g | Freq: Two times a day (BID) | ORAL | Status: DC

## 2012-05-22 MED ORDER — SODIUM CHLORIDE 0.9 % IN NEBU
INHALATION_SOLUTION | RESPIRATORY_TRACT | Status: AC
  Filled 2012-05-22: qty 3

## 2012-05-22 MED ORDER — POTASSIUM CHLORIDE CRYS ER 20 MEQ PO TBCR: 20.0000 meq | EXTENDED_RELEASE_TABLET | Freq: Every day | ORAL | Status: DC

## 2012-05-22 MED ORDER — QUETIAPINE FUMARATE 25 MG PO TABS: 25.0000 mg | ORAL_TABLET | Freq: Two times a day (BID) | ORAL | Status: AC

## 2012-05-22 MED ORDER — ADULT MULTIVITAMIN W/MINERALS CH: 1.0000 | ORAL_TABLET | Freq: Every day | ORAL | Status: DC

## 2012-05-22 MED ORDER — FUROSEMIDE 40 MG PO TABS: 40.0000 mg | ORAL_TABLET | Freq: Every day | ORAL | Status: DC

## 2012-05-22 MED ORDER — PANTOPRAZOLE SODIUM 40 MG PO TBEC: 40.0000 mg | DELAYED_RELEASE_TABLET | Freq: Every day | ORAL | Status: DC

## 2012-05-22 NOTE — Clinical Social Work Note (Signed)
CSW spoke w mother, Voncille Lo, who is agreeable to patient returning this afternoon as long as he "behaves."  Ms Providence Lanius states that "he will go right back where he came from" if he begins drinking again, says patient has agreed to stop drinking as a condition of returning home.  Patient's son given list of ALFs in [place] to consider w father if home placement does not work out.  Santa Genera, LCSW Clinical Social Worker [phone])

## 2012-05-22 NOTE — Progress Notes (Signed)
The patient is receiving Protonix by the intravenous route.  Based on criteria approved by the Pharmacy and Therapeutics Committee and the Medical Executive Committee, the medication is being converted to the equivalent oral dose form.  These criteria include: -No Active GI bleeding -Able to tolerate diet of full liquids (or better) or tube feeding OR able to tolerate other medications by the oral or enteral route  If you have any questions about this conversion, please contact the Pharmacy Department (ext [phone]).  Thank you.  Roberto [name], [place] 05/22/2012 10:42 AM

## 2012-05-22 NOTE — Clinical Social Work Note (Signed)
CSW spoke w son, [name], who asked for list of ALF facilities to be mailed to him for future reference.  List mailed.  Son advised that family or patient can contact ALF facilities directly for placement as needed.    Santa Genera, LCSW Clinical Social Worker [phone])

## 2012-05-22 NOTE — Discharge Summary (Signed)
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 0830  AST 53* 46* 47*  ALT 13 12 12   ALKPHOS 87 94 115  BILITOT 0.4 0.9 0.9  PROT 6.2 6.5 7.1  ALBUMIN 1.7* 1.8* 2.2*   No results found for this basename: LIPASE:5,AMYLASE:5 in the last 168 hours  Lab 05/18/12 0831  AMMONIA 24   CBC:  Lab 05/22/12 0437 05/21/12 0432 05/20/12 0454 05/19/12 0459 05/18/12 0830  WBC 7.5 7.2 8.2 21.5* 6.7  NEUTROABS -- -- -- -- 4.3  HGB 10.8* 10.2* 9.4* 10.2* 11.3*  HCT 31.1* 29.6* 26.9* 29.8* 32.7*  MCV 104.0* 103.5* 104.7* 106.4* 105.8*  PLT 143* 135* 140* 175 205   Cardiac Enzymes:  Lab 05/18/12 0830  CKTOTAL 111  CKMB --  CKMBINDEX --  TROPONINI --   BNP: BNP (last 3 results)  Basename 05/18/12 0830 12/15/11 0512 12/13/11 2217  PROBNP 420.7* 3592.0* 679.8*   CBG:  Lab 05/18/12 0805  GLUCAP 90       Signed:  Ledora Delker  [facility] 05/22/2012, 2:49 PM
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
## 2012-05-22 NOTE — Clinical Social Work Note (Signed)
PT note indicates patient is ambulatory and not needing SNF at discharge.  Patient appropriate for ALF placement, if desired.  Spoke w son, Zeth Buday, who is currently trying to help patient since uncle, Ed/Sam Tamburo is hospitalized and cannot function as legal/financiall POA as he has in past.  Sathvik Tiedt is also sick at this time and cannot come to hospital, but hopes to be better tomorrow.  Has spoken w patient's mother who now feels that she "can handle him" on basis of recent phone conversations between patient and mother.  In addition, patient may be appropriate for ALF placement, son will be provided w list of ALFs in area.  Understands patient will need to pay for this out of his $1700/month current income.  CSW also asking B Ratliff to complete Medicaid application w patient, as Medicaid terminated on 04-29-12 after patient was discharged from [facility] where he was placed at last discharge from [facility] for rehab.  Santa Genera, LCSW Clinical Social Worker [phone]) .

## 2012-05-22 NOTE — Progress Notes (Signed)
Pt to be discharged home per MD order. Discharge instructions, prescriptions, and follow up appoitments gone over with patient and patients family member at bedside. Pt was given information on ETOH abuse and encouraged to stop drinking. Pt acknowledge he wants to get better. Also called and went over discharge instructions with pts son [name] and pts mother Windell Moulding. Both verbalized understanding. Pts family members have talked to the social worker and have numbers if home placement does not work.

## 2012-05-23 LAB — CULTURE, BLOOD (ROUTINE X 2): Culture: NO GROWTH

## 2012-05-24 LAB — BODY FLUID CULTURE: Gram Stain: NONE SEEN

## 2012-06-04 ENCOUNTER — Ambulatory Visit (INDEPENDENT_AMBULATORY_CARE_PROVIDER_SITE_OTHER): Payer: Self-pay | Admitting: Internal Medicine

## 2012-06-10 ENCOUNTER — Ambulatory Visit (INDEPENDENT_AMBULATORY_CARE_PROVIDER_SITE_OTHER): Payer: Self-pay | Admitting: Internal Medicine

## 2012-06-13 ENCOUNTER — Telehealth (INDEPENDENT_AMBULATORY_CARE_PROVIDER_SITE_OTHER): Payer: Self-pay | Admitting: Internal Medicine

## 2012-06-13 ENCOUNTER — Encounter (INDEPENDENT_AMBULATORY_CARE_PROVIDER_SITE_OTHER): Payer: Self-pay | Admitting: Internal Medicine

## 2012-06-13 ENCOUNTER — Ambulatory Visit (INDEPENDENT_AMBULATORY_CARE_PROVIDER_SITE_OTHER): Payer: Medicaid Other | Admitting: Internal Medicine

## 2012-06-13 VITALS — BP 106/72 | HR 92 | Temp 97.8°F | Ht 73.0 in | Wt 198.0 lb

## 2012-06-13 DIAGNOSIS — K7460 Unspecified cirrhosis of liver: Secondary | ICD-10-CM

## 2012-06-13 LAB — CBC WITH DIFFERENTIAL/PLATELET
Basophils Absolute: 0.1 10*3/uL (ref 0.0–0.1)
Basophils Relative: 1 % (ref 0–1)
Eosinophils Relative: 5 % (ref 0–5)
HCT: 36.6 % — ABNORMAL LOW (ref 39.0–52.0)
MCHC: 34.2 g/dL (ref 30.0–36.0)
MCV: 104.9 fL — ABNORMAL HIGH (ref 78.0–100.0)
Monocytes Absolute: 0.6 10*3/uL (ref 0.1–1.0)
Neutro Abs: 4.3 10*3/uL (ref 1.7–7.7)
Platelets: 196 10*3/uL (ref 150–400)
RDW: 15.3 % (ref 11.5–15.5)

## 2012-06-13 LAB — COMPREHENSIVE METABOLIC PANEL
AST: 49 U/L — ABNORMAL HIGH (ref 0–37)
Alkaline Phosphatase: 85 U/L (ref 39–117)
BUN: 14 mg/dL (ref 6–23)
Creat: 1.1 mg/dL (ref 0.50–1.35)
Total Bilirubin: 0.5 mg/dL (ref 0.3–1.2)

## 2012-06-13 LAB — PROTIME-INR: INR: 1.13 (ref <1.50)

## 2012-06-13 NOTE — Telephone Encounter (Signed)
Meld score 9 
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
## 2012-06-13 NOTE — Patient Instructions (Addendum)
OV in 3 months. No drinking.

## 2012-08-02 NOTE — Progress Notes (Signed)
UR Chart Review Completed  

## 2012-09-16 ENCOUNTER — Encounter (INDEPENDENT_AMBULATORY_CARE_PROVIDER_SITE_OTHER): Payer: Self-pay | Admitting: Internal Medicine

## 2012-09-16 ENCOUNTER — Ambulatory Visit (INDEPENDENT_AMBULATORY_CARE_PROVIDER_SITE_OTHER): Payer: Medicaid Other | Admitting: Internal Medicine

## 2012-09-16 VITALS — BP 112/90 | HR 72 | Temp 96.0°F | Resp 14 | Ht 73.0 in | Wt 209.6 lb

## 2012-09-16 DIAGNOSIS — Z87898 Personal history of other specified conditions: Secondary | ICD-10-CM

## 2012-09-16 DIAGNOSIS — K7030 Alcoholic cirrhosis of liver without ascites: Secondary | ICD-10-CM

## 2012-09-16 NOTE — Patient Instructions (Addendum)
Physician will contact you with results of blood work. Please do not take more Tylenol other than what sure taking with hydrocodone/acetaminophen.

## 2012-09-16 NOTE — Progress Notes (Signed)
Presenting complaint;  Followup for alcoholic cirrhosis.  Subjective:  Patient is 67 year old Caucasian male who has history of alcoholic cirrhosis complicated by ascites who presents for scheduled visit. He was last seen 3 months ago by Ms. Dorene Ar NP. He has gained 13 pounds since his last visit. He has good appetite. He denies heartburn nausea vomiting melena or rectal bleeding. On most days he has 1-2 bowel movements. Every now and then he may go a day without a bowel movement and on some days he has 3. He continues to complain of feeling weak and at times has difficulty naming objects. He continues to have low back pain. He is still drinking alcohol usually on weekends when he is watching  ball game. He denies abdominal pain. He does not take any NSAIDs.  Current Medications: Current Outpatient Prescriptions  Medication Sig Dispense Refill  . ALPRAZolam (XANAX) 1 MG tablet Take 0.5 mg by mouth 3 (three) times daily as needed. Anxiety.      . cloNIDine (CATAPRES) 0.1 MG tablet Take 1 tablet (0.1 mg total) by mouth at bedtime.  60 tablet    . docusate sodium (COLACE) 100 MG capsule Take 1 capsule (100 mg total) by mouth every 12 (twelve) hours.  30 capsule  1  . feeding supplement (ENSURE COMPLETE) LIQD Take 237 mLs by mouth 3 (three) times daily between meals.      . Fluticasone-Salmeterol (ADVAIR) 250-50 MCG/DOSE AEPB Inhale 1 puff into the lungs every 12 (twelve) hours.      . furosemide (LASIX) 40 MG tablet Take 1 tablet (40 mg total) by mouth daily. Medication to decrease fluid accumulation.  30 tablet  2  . HYDROcodone-acetaminophen (NORCO/VICODIN) 5-325 MG per tablet Take 1 tablet by mouth every 6 (six) hours as needed for pain.  30 tablet  0  . lactulose (CHRONULAC) 10 GM/15ML solution Take 45 mLs (30 g total) by mouth 2 (two) times daily.  240 mL  2  . omeprazole (PRILOSEC) 40 MG capsule Take 1 capsule (40 mg total) by mouth 2 (two) times daily. Acid Reflux      . potassium  chloride SA (K-DUR,KLOR-CON) 20 MEQ tablet Take 1 tablet (20 mEq total) by mouth daily. Take with furosemide (Lasix).  30 tablet  2  . spironolactone (ALDACTONE) 100 MG tablet Take 1 tablet (100 mg total) by mouth 2 (two) times daily. For treatment of fluid accumulation from cirrhosis.  60 tablet  2  . thiamine 100 MG tablet Take 1 tablet (100 mg total) by mouth daily.       No current facility-administered medications for this visit.     Objective: Blood pressure 112/90, pulse 72, temperature 96 F (35.6 C), temperature source Oral, resp. rate 14, height 6\' 1"  (1.854 m), weight 209 lb 9.6 oz (95.074 kg). Patient is alert and in no acute distress. Asterixis absent. He ambulates with quad cane. Conjunctiva is pink. Sclera is nonicteric Oropharyngeal mucosa is normal. No neck masses or thyromegaly noted. Cardiac exam with regular rhythm normal S1 and S2. No murmur or gallop noted. Lungs are clear to auscultation. Abdomen is symmetrical. Small umbilicus hernia which is soft and reducible. Abdomen is soft and nontender. Been tip is palpable. No LE edema or clubbing noted.   Assessment:  #1. Alcoholic cirrhosis complicated by ascites. He has gained 13 pounds in the last 3 months but clinically he does not appear to have significant ascites. Therefore will monitor his weight. #2. High-risk medications. Patient is on Vicodin  10/325 and alprazolam and at risk for hepatic encephalopathy. #3. History of peptic ulcer. Patient remains on PPI. Patient aware that he cannot take OTC or prescription NSAIDs.   Plan:  Patient will go to the lab for CBC, comprehensive chemistry panel INR and alpha-fetoprotein. Patient advised not to take any more Tylenol than he is taking which is 1.3 g per day(Vicodin). He will discuss with Dr. Anice Paganini if he could be switched to lorazepam did diminish risk of encephalopathy. Once again patient advised that he should not drink alcohol. Office visit in 4  months.

## 2012-09-17 LAB — PROTIME-INR
INR: 1.19 (ref <1.50)
Prothrombin Time: 15 s (ref 11.6–15.2)

## 2012-09-18 LAB — CBC
HCT: 39.2 % (ref 39.0–52.0)
Hemoglobin: 13.4 g/dL (ref 13.0–17.0)
RBC: 3.98 MIL/uL — ABNORMAL LOW (ref 4.22–5.81)
RDW: 16 % — ABNORMAL HIGH (ref 11.5–15.5)
WBC: 7.7 10*3/uL (ref 4.0–10.5)

## 2012-09-18 LAB — COMPREHENSIVE METABOLIC PANEL
AST: 38 U/L — ABNORMAL HIGH (ref 0–37)
Albumin: 3.5 g/dL (ref 3.5–5.2)
BUN: 13 mg/dL (ref 6–23)
CO2: 27 mEq/L (ref 19–32)
Calcium: 8.8 mg/dL (ref 8.4–10.5)
Chloride: 104 mEq/L (ref 96–112)
Glucose, Bld: 111 mg/dL — ABNORMAL HIGH (ref 70–99)
Potassium: 4.4 mEq/L (ref 3.5–5.3)

## 2012-09-18 LAB — AFP TUMOR MARKER: AFP-Tumor Marker: 4.7 ng/mL (ref 0.0–8.0)

## 2012-10-24 ENCOUNTER — Telehealth (INDEPENDENT_AMBULATORY_CARE_PROVIDER_SITE_OTHER): Payer: Self-pay | Admitting: *Deleted

## 2012-10-24 NOTE — Telephone Encounter (Signed)
Can stop KCL since he is taking spironolactone.

## 2012-10-24 NOTE — Telephone Encounter (Signed)
Patient was made aware of the results by Dr. Karilyn Cota in February : Notes Recorded by Malissa Hippo, MD on 09/21/2012 at 1:43 PM Results reviewed with patient. H&H is now normal. AFP is normal. AST is mildly elevated. Patient once again reminded that he should not drink alcohol. Reports to PCP   I will make Dr.Rehman aware that the patient wants him to review his medications, after reviewed with Dr.Rehman I will call the patient back.

## 2012-10-24 NOTE — Telephone Encounter (Signed)
Dr.Rehman patient wants you to review his medications and see if there any medications he can be taken off any? Is this not something that he should see his PCP for?

## 2012-10-24 NOTE — Telephone Encounter (Signed)
Patient has not heard back from lab results and also would like Dr. Karilyn Cota to look at his medications to see if there something he can do without. He is on so much that he can not afford them all.  Roberto [name] he would need to contact the prescribing provider and/or PCP about his medications. Roberto [name] is still wanting Dr. Karilyn Cota to take a look at this. The return phone number is [phone].

## 2012-10-24 NOTE — Telephone Encounter (Signed)
He will need to talk with PCP

## 2012-10-25 NOTE — Telephone Encounter (Signed)
Attempted to call Patient line busy

## 2012-10-29 NOTE — Telephone Encounter (Signed)
Patient was called and given Dr.Rehman's recommendation. 

## 2012-10-31 ENCOUNTER — Telehealth (INDEPENDENT_AMBULATORY_CARE_PROVIDER_SITE_OTHER): Payer: Self-pay | Admitting: *Deleted

## 2012-10-31 NOTE — Telephone Encounter (Signed)
LM asking if we could please send a list of medications he takes to [employer] . They are going to give him a price list. The return phone number is [phone].

## 2012-10-31 NOTE — Telephone Encounter (Signed)
Patient called Roberto [name] back and stated that he had found his list.

## 2012-11-14 ENCOUNTER — Other Ambulatory Visit (INDEPENDENT_AMBULATORY_CARE_PROVIDER_SITE_OTHER): Payer: Self-pay | Admitting: Internal Medicine

## 2012-11-14 MED ORDER — CLONIDINE HCL 0.1 MG PO TABS: 0.1000 mg | ORAL_TABLET | Freq: Every day | ORAL | Status: DC

## 2012-11-18 ENCOUNTER — Emergency Department (HOSPITAL_COMMUNITY)
Admission: EM | Admit: 2012-11-18 | Discharge: 2012-11-18 | Disposition: A | Discharge status: Home / Self Care | Payer: Self-pay | Attending: Emergency Medicine | Admitting: Emergency Medicine

## 2012-11-18 ENCOUNTER — Encounter (HOSPITAL_COMMUNITY): Payer: Self-pay | Admitting: *Deleted

## 2012-11-18 ENCOUNTER — Emergency Department (HOSPITAL_COMMUNITY): Payer: Self-pay

## 2012-11-18 DIAGNOSIS — Z8679 Personal history of other diseases of the circulatory system: Secondary | ICD-10-CM | POA: Insufficient documentation

## 2012-11-18 DIAGNOSIS — Z8781 Personal history of (healed) traumatic fracture: Secondary | ICD-10-CM | POA: Insufficient documentation

## 2012-11-18 DIAGNOSIS — R0989 Other specified symptoms and signs involving the circulatory and respiratory systems: Secondary | ICD-10-CM | POA: Insufficient documentation

## 2012-11-18 DIAGNOSIS — Z79899 Other long term (current) drug therapy: Secondary | ICD-10-CM | POA: Insufficient documentation

## 2012-11-18 DIAGNOSIS — R110 Nausea: Secondary | ICD-10-CM | POA: Insufficient documentation

## 2012-11-18 DIAGNOSIS — R059 Cough, unspecified: Secondary | ICD-10-CM | POA: Insufficient documentation

## 2012-11-18 DIAGNOSIS — R05 Cough: Secondary | ICD-10-CM | POA: Insufficient documentation

## 2012-11-18 DIAGNOSIS — N390 Urinary tract infection, site not specified: Secondary | ICD-10-CM | POA: Insufficient documentation

## 2012-11-18 DIAGNOSIS — J449 Chronic obstructive pulmonary disease, unspecified: Secondary | ICD-10-CM | POA: Insufficient documentation

## 2012-11-18 DIAGNOSIS — R269 Unspecified abnormalities of gait and mobility: Secondary | ICD-10-CM | POA: Insufficient documentation

## 2012-11-18 DIAGNOSIS — R0609 Other forms of dyspnea: Secondary | ICD-10-CM | POA: Insufficient documentation

## 2012-11-18 DIAGNOSIS — R5381 Other malaise: Secondary | ICD-10-CM | POA: Insufficient documentation

## 2012-11-18 DIAGNOSIS — F1020 Alcohol dependence, uncomplicated: Secondary | ICD-10-CM | POA: Insufficient documentation

## 2012-11-18 DIAGNOSIS — Z8719 Personal history of other diseases of the digestive system: Secondary | ICD-10-CM | POA: Insufficient documentation

## 2012-11-18 DIAGNOSIS — J4489 Other specified chronic obstructive pulmonary disease: Secondary | ICD-10-CM | POA: Insufficient documentation

## 2012-11-18 DIAGNOSIS — Z9861 Coronary angioplasty status: Secondary | ICD-10-CM | POA: Insufficient documentation

## 2012-11-18 DIAGNOSIS — I10 Essential (primary) hypertension: Secondary | ICD-10-CM | POA: Insufficient documentation

## 2012-11-18 DIAGNOSIS — F1027 Alcohol dependence with alcohol-induced persisting dementia: Secondary | ICD-10-CM | POA: Insufficient documentation

## 2012-11-18 DIAGNOSIS — R0602 Shortness of breath: Secondary | ICD-10-CM | POA: Insufficient documentation

## 2012-11-18 DIAGNOSIS — Z8669 Personal history of other diseases of the nervous system and sense organs: Secondary | ICD-10-CM | POA: Insufficient documentation

## 2012-11-18 DIAGNOSIS — F17200 Nicotine dependence, unspecified, uncomplicated: Secondary | ICD-10-CM | POA: Insufficient documentation

## 2012-11-18 DIAGNOSIS — R5383 Other fatigue: Secondary | ICD-10-CM

## 2012-11-18 DIAGNOSIS — F1910 Other psychoactive substance abuse, uncomplicated: Secondary | ICD-10-CM | POA: Insufficient documentation

## 2012-11-18 LAB — URINALYSIS, ROUTINE W REFLEX MICROSCOPIC
Bilirubin Urine: NEGATIVE
Ketones, ur: NEGATIVE mg/dL
Nitrite: POSITIVE — AB
Protein, ur: NEGATIVE mg/dL
Specific Gravity, Urine: 1.01 (ref 1.005–1.030)
Urobilinogen, UA: 4 mg/dL — ABNORMAL HIGH (ref 0.0–1.0)

## 2012-11-18 LAB — CBC WITH DIFFERENTIAL/PLATELET
Basophils Absolute: 0.1 10*3/uL (ref 0.0–0.1)
Basophils Relative: 1 % (ref 0–1)
Eosinophils Relative: 3 % (ref 0–5)
HCT: 41.8 % (ref 39.0–52.0)
MCHC: 34.9 g/dL (ref 30.0–36.0)
Monocytes Absolute: 0.7 10*3/uL (ref 0.1–1.0)
Neutro Abs: 5.1 10*3/uL (ref 1.7–7.7)
RDW: 15.8 % — ABNORMAL HIGH (ref 11.5–15.5)

## 2012-11-18 LAB — HEPATIC FUNCTION PANEL
Albumin: 3.3 g/dL — ABNORMAL LOW (ref 3.5–5.2)
Bilirubin, Direct: 0.2 mg/dL (ref 0.0–0.3)
Indirect Bilirubin: 0.3 mg/dL (ref 0.3–0.9)
Total Bilirubin: 0.5 mg/dL (ref 0.3–1.2)

## 2012-11-18 LAB — BASIC METABOLIC PANEL
Calcium: 9.3 mg/dL (ref 8.4–10.5)
Chloride: 103 mEq/L (ref 96–112)
Creatinine, Ser: 1.24 mg/dL (ref 0.50–1.35)
GFR calc Af Amer: 69 mL/min — ABNORMAL LOW (ref >90)
GFR calc non Af Amer: 60 mL/min — ABNORMAL LOW (ref >90)

## 2012-11-18 LAB — TROPONIN I: Troponin I: <0.3 ng/mL (ref <0.30)

## 2012-11-18 LAB — PRO B NATRIURETIC PEPTIDE: Pro B Natriuretic peptide (BNP): 25.9 pg/mL (ref 0–125)

## 2012-11-18 MED ORDER — CIPROFLOXACIN HCL 250 MG PO TABS: 250.0000 mg | ORAL_TABLET | Freq: Two times a day (BID) | ORAL | Status: DC

## 2012-11-18 NOTE — ED Notes (Signed)
Pt up ambulatory in room; gait steady; in no distress.

## 2012-11-18 NOTE — Progress Notes (Signed)
  Echocardiogram Echocardiogram Transesophageal has been performed.  Margreta Journey 11/18/2012, 3:12 PM

## 2012-11-18 NOTE — ED Notes (Signed)
Patient is unable to give a urine sample at this time.  

## 2012-11-18 NOTE — ED Notes (Signed)
MD at bedside. 

## 2012-11-18 NOTE — ED Provider Notes (Signed)
History  This chart was scribed for Roberto Racer, MD by Shari Heritage, ED Scribe. The patient was seen in room APA18/APA18. Patient's care was started at 1310.   CSN: [id number]  Arrival date & time 11/18/12  1041   First MD Initiated Contact with Patient 11/18/12 1310      Chief Complaint  Patient presents with  . Fatigue  . Gait Problem     Patient is a 67 y.o. male presenting with weakness. The history is provided by the patient. No language interpreter was used.  Weakness This is a recurrent problem. The current episode started more than 1 week ago. The problem occurs constantly. The problem has not changed since onset.Associated symptoms include shortness of breath. Pertinent negatives include no chest pain, no abdominal pain and no headaches. The symptoms are aggravated by walking.    HPI Comments: Roberto [name] is a 67 y.o. male with history of COPD, cirrhosis, hypertension,  who presents to the Emergency Department complaining of increased generalized weakness and fatigue for the past 4 months. Patient states that he has been less active. He reports mild dyspnea on exertion and increased cough from baseline. He denies orthopnea. Patient states the he is eating normally. He denies fever, chills, leg swelling, chest pain, palpitations, nausea, vomiting or any pain. Other medical history includes substance abuse, cervical fracture, lumbar fracture, encephalopathy and hepatic steatosis.  PCP - [facility]  Past Medical History  Diagnosis Date  . COPD (chronic obstructive pulmonary disease)   . Hypertension   . Hepatic steatosis 12/14/2011  . Compression fracture of L1 lumbar vertebra 12/14/2011  . C2 cervical fracture   . Duodenal ulcer with hemorrhage 12/15/2011    Likely source of bleeding.per EGD; Dr. Karilyn Cota  . Esophageal varices without bleeding 12/15/2011    per EGD  . Cirrhosis 12/16/2011    per ultrasound  . Hx of substance abuse 12/17/2011  . Alcohol dependence with  alcohol-induced persisting dementia 12/18/2011  . Hepatic encephalopathy   . Helicobacter pylori antibody positive 12/19/2011  . Encephalopathy 05/18/2012  . UTI (urinary tract infection) 12/13/2011  . Ascites 05/18/2012    S/p paracentesis yielding 7880 mL    Past Surgical History  Procedure Laterality Date  . Coronary angioplasty with stent placement      No family history on file.  History  Substance Use Topics  . Smoking status: Current Every Day Smoker -- 1.00 packs/day    Types: Cigarettes  . Smokeless tobacco: Never Used     Comment: 1 1/2 pack a day  . Alcohol Use: Yes     Comment: Patient states that he drinks 1/2 beer every 3-4 weeks      Review of Systems  Constitutional: Positive for fatigue.  Eyes: Negative for visual disturbance.  Respiratory: Positive for cough and shortness of breath.   Cardiovascular: Negative for chest pain, palpitations and leg swelling.  Gastrointestinal: Positive for nausea. Negative for vomiting and abdominal pain.  Genitourinary: Negative for difficulty urinating.  Musculoskeletal: Negative for back pain.  Skin: Negative for rash.  Neurological: Positive for weakness. Negative for headaches.  All other systems reviewed and are negative.    Allergies  Review of patient's allergies indicates no known allergies.  Home Medications   Current Outpatient Rx  Name  Route  Sig  Dispense  Refill  . ALPRAZolam (XANAX) 1 MG tablet   Oral   Take 0.5 mg by mouth 3 (three) times daily as needed. Anxiety.         Marland Kitchen  feeding supplement (ENSURE COMPLETE) LIQD   Oral   Take 237 mLs by mouth 3 (three) times daily between meals.         . Fluticasone-Salmeterol (ADVAIR) 250-50 MCG/DOSE AEPB   Inhalation   Inhale 1 puff into the lungs every 12 (twelve) hours.         . furosemide (LASIX) 40 MG tablet   Oral   Take 1 tablet (40 mg total) by mouth daily. Medication to decrease fluid accumulation.   30 tablet   2   .  HYDROcodone-acetaminophen (NORCO) 10-325 MG per tablet   Oral   Take 1 tablet by mouth every 6 (six) hours as needed for pain.         Marland Kitchen lactulose (CHRONULAC) 10 GM/15ML solution   Oral   Take 45 mLs (30 g total) by mouth 2 (two) times daily.   240 mL   2   . Multiple Vitamin (MULTIVITAMIN WITH MINERALS) TABS   Oral   Take 1 tablet by mouth daily.         Marland Kitchen olmesartan (BENICAR) 40 MG tablet   Oral   Take 40 mg by mouth daily.         . potassium chloride SA (K-DUR,KLOR-CON) 20 MEQ tablet   Oral   Take 1 tablet (20 mEq total) by mouth daily. Take with furosemide (Lasix).   30 tablet   2   . spironolactone (ALDACTONE) 100 MG tablet   Oral   Take 1 tablet (100 mg total) by mouth 2 (two) times daily. For treatment of fluid accumulation from cirrhosis.   60 tablet   2   . ciprofloxacin (CIPRO) 250 MG tablet   Oral   Take 1 tablet (250 mg total) by mouth every 12 (twelve) hours.   14 tablet   0     Triage Vitals: BP 118/80  Pulse 68  Temp(Src) 97.7 F (36.5 C) (Oral)  Resp 24  Ht 6\' 1"  (1.854 m)  Wt 230 lb (104.327 kg)  BMI 30.35 kg/m2  SpO2 95%  Physical Exam  Constitutional: He is oriented to person, place, and time. He appears well-developed and well-nourished.  HENT:  Head: Normocephalic and atraumatic.  Eyes: Conjunctivae and EOM are normal. Pupils are equal, round, and reactive to light.  Neck: Normal range of motion. Neck supple.  Cardiovascular: Normal rate, regular rhythm and normal heart sounds.   Pulmonary/Chest: Effort normal.  Bilateral crackles.  Abdominal: Soft. There is no tenderness.  Musculoskeletal: Normal range of motion.  No calf swelling or tenderness.  Neurological: He is alert and oriented to person, place, and time.  Skin: Skin is warm and dry.    ED Course  Procedures (including critical care time) DIAGNOSTIC STUDIES: Oxygen Saturation is 95% on room air, adequate by my interpretation.    COORDINATION OF CARE: 2:13 PM-  Patient informed of current plan for treatment and evaluation and agrees with plan at this time.   4:07 PM- Patient UA is consistent with a UTI. Advised patient to follow up with PCP to ensure a resolution. Patient verbalizes understanding and agrees to treatment plan.    Labs Reviewed  CBC WITH DIFFERENTIAL - Abnormal; Notable for the following:    RBC 4.18 (*)    MCH 34.9 (*)    RDW 15.8 (*)    All other components within normal limits  BASIC METABOLIC PANEL - Abnormal; Notable for the following:    Glucose, Bld 107 (*)    GFR calc  non Af Amer 60 (*)    GFR calc Af Amer 69 (*)    All other components within normal limits  HEPATIC FUNCTION PANEL - Abnormal; Notable for the following:    Albumin 3.3 (*)    AST 48 (*)    All other components within normal limits  URINALYSIS, ROUTINE W REFLEX MICROSCOPIC - Abnormal; Notable for the following:    Urobilinogen, UA 4.0 (*)    Nitrite POSITIVE (*)    Leukocytes, UA SMALL (*)    All other components within normal limits  URINE MICROSCOPIC-ADD ON - Abnormal; Notable for the following:    Bacteria, UA FEW (*)    All other components within normal limits  URINE CULTURE  TROPONIN I  PRO B NATRIURETIC PEPTIDE    Dg Chest 2 View  11/18/2012  *RADIOLOGY REPORT*  Clinical Data: Short of breath  CHEST - 2 VIEW  Comparison: 05/18/2012 and multiple previous  Findings: Heart size is normal.  Coronary arteries then is noted. The pulmonary vascularity is normal.  There are areas of interstitial pulmonary scarring.  No sign of active infiltrate, mass, effusion or collapse.  No acute bony finding.  IMPRESSION: No active disease.  Coronary artery stent.  pulmonary scarring.   Original Report Authenticated By: Paulina Fusi, M.D.      1. UTI (urinary tract infection)   2. Fatigue      Date: 11/18/2012  Rate: 65  Rhythm: normal sinus rhythm  QRS Axis: normal  Intervals: normal  ST/T Wave abnormalities: normal  Conduction Disutrbances:none   Narrative Interpretation:   Old EKG Reviewed: unchanged    MDM  I personally performed the services described in this documentation, which was scribed in my presence. The recorded information has been reviewed and is accurate.  Pt is well appearing. Walking in ED without difficulty. Advised to f/u with PMD in 1 week to assure resolution of UTI.   Roberto Racer, MD 11/18/12 [phone]

## 2012-11-18 NOTE — ED Notes (Signed)
Increased weakness, fatigue, and unsteady gait x 2 months.  Denies pain.

## 2012-11-20 LAB — URINE CULTURE: Colony Count: >=100000

## 2012-11-21 ENCOUNTER — Telehealth (HOSPITAL_COMMUNITY): Payer: Self-pay | Admitting: Emergency Medicine

## 2012-11-22 ENCOUNTER — Telehealth (HOSPITAL_COMMUNITY): Payer: Self-pay | Admitting: Emergency Medicine

## 2013-01-13 ENCOUNTER — Ambulatory Visit (INDEPENDENT_AMBULATORY_CARE_PROVIDER_SITE_OTHER): Payer: Self-pay | Admitting: Internal Medicine

## 2013-01-13 ENCOUNTER — Encounter (INDEPENDENT_AMBULATORY_CARE_PROVIDER_SITE_OTHER): Payer: Self-pay | Admitting: Internal Medicine

## 2013-01-13 VITALS — BP 138/70 | HR 74 | Temp 98.6°F | Resp 16 | Ht 73.0 in | Wt 219.9 lb

## 2013-01-13 DIAGNOSIS — R5383 Other fatigue: Secondary | ICD-10-CM

## 2013-01-13 DIAGNOSIS — K7460 Unspecified cirrhosis of liver: Secondary | ICD-10-CM

## 2013-01-13 DIAGNOSIS — K7682 Hepatic encephalopathy: Secondary | ICD-10-CM

## 2013-01-13 DIAGNOSIS — K7290 Hepatic failure, unspecified without coma: Secondary | ICD-10-CM

## 2013-01-13 DIAGNOSIS — R531 Weakness: Secondary | ICD-10-CM

## 2013-01-13 LAB — HEPATIC FUNCTION PANEL
ALT: 21 U/L (ref 0–53)
AST: 35 U/L (ref 0–37)
Total Protein: 7.3 g/dL (ref 6.0–8.3)

## 2013-01-13 LAB — AMMONIA: Ammonia: 30 umol/L (ref 11–60)

## 2013-01-13 MED ORDER — LACTULOSE 10 GM/15ML PO SOLN: 10.0000 g | Freq: Two times a day (BID) | ORAL | Status: DC

## 2013-01-13 NOTE — Patient Instructions (Signed)
Physician/office  will contact her with results of blood work. Take lactulose twice daily. Ultrasound to be scheduled.

## 2013-01-13 NOTE — Progress Notes (Signed)
Presenting complaint;  Followup for alcoholic cirrhosis.  Subjective:  Patient is a 67 year old Caucasian male who has history of alcoholic cirrhosis complicated by ascites and hepatic encephalopathy. He states he does not drink alcohol anymore. He has gained almost 11 pounds since his last visit 4 months ago. He denies abdominal distention or lower extremity edema. He states he eats a lot of junk food. He denies abdominal pain melena or rectal bleeding. He is not taking lactulose. His bowels move every other day. He does give history of intermittent confusion. He is also complaining repeatedly of having no energy and wants to try Adderall and he also wonders if he needs to be on testosterone. He also complains of being depressed. He was given Zoloft by Dr. MICU but he had side effects. He is taking pain medication 4 times a day and believes he needs higher dose.   Current Medications: Current Outpatient Prescriptions  Medication Sig Dispense Refill  . ALPRAZolam (XANAX) 1 MG tablet Take 0.5 mg by mouth 3 (three) times daily as needed. Anxiety.      . docusate sodium (COLACE) 100 MG capsule Take 100 mg by mouth daily. Patient states that he takes 3-4 tablets a day      . Fluticasone-Salmeterol (ADVAIR) 250-50 MCG/DOSE AEPB Inhale 1 puff into the lungs every 12 (twelve) hours.      Marland Kitchen HYDROcodone-acetaminophen (NORCO) 10-325 MG per tablet Take 1 tablet by mouth every 6 (six) hours as needed for pain.      Marland Kitchen lactulose (CHRONULAC) 10 GM/15ML solution Take 45 mLs (30 g total) by mouth 2 (two) times daily.  240 mL  2  . Multiple Vitamin (MULTIVITAMIN WITH MINERALS) TABS Take 1 tablet by mouth daily.      Marland Kitchen olmesartan (BENICAR) 40 MG tablet Take 40 mg by mouth daily.      . furosemide (LASIX) 40 MG tablet Take 1 tablet (40 mg total) by mouth daily. Medication to decrease fluid accumulation.  30 tablet  2  . potassium chloride SA (K-DUR,KLOR-CON) 20 MEQ tablet Take 1 tablet (20 mEq total) by mouth daily.  Take with furosemide (Lasix).  30 tablet  2  . spironolactone (ALDACTONE) 100 MG tablet Take 1 tablet (100 mg total) by mouth 2 (two) times daily. For treatment of fluid accumulation from cirrhosis.  60 tablet  2   No current facility-administered medications for this visit.     Objective: Blood pressure 138/70, pulse 74, temperature 98.6 F (37 C), temperature source Oral, resp. rate 16, height 6\' 1"  (1.854 m), weight 219 lb 14.4 oz (99.746 kg). Patient appears quite anxious and at times in tears. He is alert and does not have asterixis. He has slight tremors to both hands. Conjunctiva is pink. Sclera is nonicteric Oropharyngeal mucosa is normal. No neck masses or thyromegaly noted. Cardiac exam with regular rhythm normal S1 and S2. No murmur or gallop noted. Lungs are clear to auscultation. Abdomen is full but soft and nontender. He has small unlikely hernia. Liver edge is indistinct below RCM but spleen tip is palpable. No shifting dullness noted. No LE edema or clubbing noted.  Labs/studies Results: LFTs from 11/18/2012 bilirubin oh 0.5 AP 64, AST 48 and ALT 16. Albumin was 3.3.  Assessment:  #1. Alcoholic cirrhosis, located by ascites and he also has history of hepatic encephalopathy. He does not appear to be encephalopathic although he complains of intermittent confusion. His confusion may be secondary to his medications and or depression. He has gained over  20 pounds in the last 7 months but clinically he does not have ascites. His hepatic function has improved greatly we'll since last year but his AST remains elevated. #2. Weakness. The symptoms seem to bother him greatly. H&H less than 2 months ago was normal last TSH was almost 10 months ago. #3. Depression.    Plan:  Patient advised to take lactulose daily. Prescription given for lactulose 10 g by mouth twice a day. Patient will go to the lab TSH, serum pneumonia and LFT. Abdominal ultrasound for HCC screening and also  to determine if ascites is re-accumulating. Next AFP will be in August 2014. Patient's condition was discussed with Dr. Yetta Numbers over the phone and patient encouraged to make an appointment to see him as soon as possible. Office visit in six months.

## 2013-04-01 ENCOUNTER — Encounter (HOSPITAL_COMMUNITY): Payer: Self-pay | Admitting: *Deleted

## 2013-04-01 ENCOUNTER — Emergency Department (HOSPITAL_COMMUNITY)
Admission: EM | Admit: 2013-04-01 | Discharge: 2013-04-01 | Disposition: A | Discharge status: Home / Self Care | Payer: Medicare Other | Attending: Emergency Medicine | Admitting: Emergency Medicine

## 2013-04-01 DIAGNOSIS — J4489 Other specified chronic obstructive pulmonary disease: Secondary | ICD-10-CM | POA: Insufficient documentation

## 2013-04-01 DIAGNOSIS — M79609 Pain in unspecified limb: Secondary | ICD-10-CM | POA: Insufficient documentation

## 2013-04-01 DIAGNOSIS — M545 Low back pain, unspecified: Secondary | ICD-10-CM | POA: Insufficient documentation

## 2013-04-01 DIAGNOSIS — J449 Chronic obstructive pulmonary disease, unspecified: Secondary | ICD-10-CM | POA: Insufficient documentation

## 2013-04-01 DIAGNOSIS — I10 Essential (primary) hypertension: Secondary | ICD-10-CM | POA: Insufficient documentation

## 2013-04-01 DIAGNOSIS — F17200 Nicotine dependence, unspecified, uncomplicated: Secondary | ICD-10-CM | POA: Insufficient documentation

## 2013-04-01 DIAGNOSIS — Z87440 Personal history of urinary (tract) infections: Secondary | ICD-10-CM | POA: Insufficient documentation

## 2013-04-01 DIAGNOSIS — Z8719 Personal history of other diseases of the digestive system: Secondary | ICD-10-CM | POA: Insufficient documentation

## 2013-04-01 DIAGNOSIS — F1021 Alcohol dependence, in remission: Secondary | ICD-10-CM | POA: Insufficient documentation

## 2013-04-01 DIAGNOSIS — Z79899 Other long term (current) drug therapy: Secondary | ICD-10-CM | POA: Insufficient documentation

## 2013-04-01 DIAGNOSIS — G8929 Other chronic pain: Secondary | ICD-10-CM | POA: Insufficient documentation

## 2013-04-01 DIAGNOSIS — Z8711 Personal history of peptic ulcer disease: Secondary | ICD-10-CM | POA: Insufficient documentation

## 2013-04-01 DIAGNOSIS — Z87828 Personal history of other (healed) physical injury and trauma: Secondary | ICD-10-CM | POA: Insufficient documentation

## 2013-04-01 DIAGNOSIS — M549 Dorsalgia, unspecified: Secondary | ICD-10-CM

## 2013-04-01 MED ORDER — MORPHINE SULFATE 4 MG/ML IJ SOLN
4.0000 mg | Freq: Once | INTRAMUSCULAR | Status: AC
  Administered 2013-04-01: 4 mg via INTRAMUSCULAR
  Filled 2013-04-01: qty 1

## 2013-04-01 MED ORDER — METHOCARBAMOL 500 MG PO TABS
1000.0000 mg | ORAL_TABLET | Freq: Once | ORAL | Status: AC
  Administered 2013-04-01: 1000 mg via ORAL
  Filled 2013-04-01: qty 2

## 2013-04-01 NOTE — ED Notes (Signed)
Patient with no complaints at this time. Respirations even and unlabored. Skin warm/dry. Discharge instructions reviewed with patient at this time. Patient given opportunity to voice concerns/ask questions. Patient discharged at this time and left Emergency Department with steady gait.   

## 2013-04-01 NOTE — ED Notes (Signed)
Discharge instructions reviewed with patient at this time. Awaiting ride home.

## 2013-04-01 NOTE — ED Notes (Signed)
Lower back pain x 2 months after attempting to pick disabled mother off floor.   Hx of chronic back pain for which pt takes Hydrocodone for.  Took 3-4 tabs x "a few hours ago" with some relief.

## 2013-04-03 NOTE — ED Provider Notes (Signed)
CSN: [id number]     Arrival date & time 04/01/13  0848 History   First MD Initiated Contact with Patient 04/01/13 [phone]     Chief Complaint  Patient presents with  . Back Pain   (Consider location/radiation/quality/duration/timing/severity/associated sxs/prior Treatment) Patient is a 67 y.o. male presenting with back pain. The history is provided by the patient.  Back Pain Location:  Lumbar spine Quality:  Aching Radiates to:  L posterior upper leg and R posterior upper leg Pain severity:  Moderate Pain is:  Same all the time Onset quality:  Gradual Duration:  2 months Timing:  Constant Progression:  Unchanged Chronicity:  Chronic Context: lifting heavy objects and twisting   Context comment:  Patient has hx of chronic low back pain since bending over to pick up his mother who had fallen Relieved by: some relief with oral narcotics.   Worsened by:  Standing, twisting and bending Ineffective treatments:  Heating pad, bed rest, OTC medications and NSAIDs Associated symptoms: leg pain   Associated symptoms: no abdominal pain, no abdominal swelling, no bladder incontinence, no bowel incontinence, no chest pain, no dysuria, no fever, no headaches, no numbness, no paresthesias, no pelvic pain, no perianal numbness, no tingling, no weakness and no weight loss     Past Medical History  Diagnosis Date  . COPD (chronic obstructive pulmonary disease)   . Hypertension   . Hepatic steatosis 12/14/2011  . Compression fracture of L1 lumbar vertebra 12/14/2011  . C2 cervical fracture   . Duodenal ulcer with hemorrhage 12/15/2011    Likely source of bleeding.per EGD; Dr. Karilyn Cota  . Esophageal varices without bleeding 12/15/2011    per EGD  . Cirrhosis 12/16/2011    per ultrasound  . Hx of substance abuse 12/17/2011  . 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 Hepatic encephalopathy   . Helicobacter pylori antibody positive 12/19/2011  . Encephalopathy 05/18/2012  .  UTI (urinary tract infection) 12/13/2011  . Ascites 05/18/2012    S/p paracentesis yielding 7880 mL   Past Surgical History  Procedure Laterality Date  . Coronary angioplasty with stent placement     No family history on file. History  Substance Use Topics  . Smoking status: Current Every Day Smoker -- 1.00 packs/day    Types: Cigarettes  . Smokeless tobacco: Never Used     Comment: 1 1/2 pack a day  . Alcohol Use: Yes     Comment: Patient states that he drinks 1 beer per weeks    Review of Systems  Constitutional: Negative for fever and weight loss.  Respiratory: Negative for shortness of breath.   Cardiovascular: Negative for chest pain.  Gastrointestinal: Negative for vomiting, abdominal pain, constipation and bowel incontinence.  Genitourinary: Negative for bladder incontinence, dysuria, hematuria, flank pain, decreased urine volume, difficulty urinating and pelvic pain.       No perineal numbness or incontinence of urine or feces  Musculoskeletal: Positive for back pain. Negative for joint swelling.  Skin: Negative for rash.  Neurological: Negative for tingling, weakness, numbness, headaches and paresthesias.  All other systems reviewed and are negative.    Allergies  Review of patient's allergies indicates no known allergies.  Home Medications   Current Outpatient Rx  Name  Route  Sig  Dispense  Refill  . ALPRAZolam (XANAX) 1 MG tablet   Oral   Take 0.5-1 mg by mouth at bedtime. Anxiety.         . Fluticasone-Salmeterol (ADVAIR) 250-50 MCG/DOSE AEPB  Inhalation   Inhale 1 puff into the lungs every 12 (twelve) hours.         Marland Kitchen HYDROcodone-acetaminophen (NORCO) 10-325 MG per tablet   Oral   Take 1 tablet by mouth every 6 (six) hours as needed for pain.         Marland Kitchen lactulose (CHRONULAC) 10 GM/15ML solution   Oral   Take 10 g by mouth 2 (two) times daily as needed (Constipation).         . Multiple Vitamin (MULTIVITAMIN WITH MINERALS) TABS   Oral    Take 1 tablet by mouth daily.         Marland Kitchen olmesartan (BENICAR) 40 MG tablet   Oral   Take 40 mg by mouth daily.          BP 108/66  Pulse 71  Temp(Src) 98 F (36.7 C) (Oral)  Resp 16  Ht 6\' 1"  (1.854 m)  Wt 230 lb (104.327 kg)  BMI 30.35 kg/m2  SpO2 93% Physical Exam  Nursing note and vitals reviewed. Constitutional: He is oriented to person, place, and time. He appears well-developed and well-nourished. No distress.  HENT:  Head: Normocephalic and atraumatic.  Neck: Normal range of motion. Neck supple.  Cardiovascular: Normal rate, regular rhythm, normal heart sounds and intact distal pulses.   No murmur heard. Pulmonary/Chest: Effort normal and breath sounds normal. No respiratory distress.  Abdominal: Soft. He exhibits no distension. There is no tenderness.  Musculoskeletal: He exhibits tenderness. He exhibits no edema.       Lumbar back: He exhibits tenderness, bony tenderness and pain. He exhibits normal range of motion, no swelling, no deformity, no laceration and normal pulse.       Back:  ttp of the  lumbar spine and paraspinal muscles. DP pulses are brisk and symmetrical.  Distal sensation intact.  Hip Flexors/Extensors are intact  Neurological: He is alert and oriented to person, place, and time. He has normal strength. No sensory deficit. He exhibits normal muscle tone. Coordination and gait normal.  Reflex Scores:      Patellar reflexes are 2+ on the right side and 2+ on the left side.      Achilles reflexes are 2+ on the right side and 2+ on the left side. Skin: Skin is warm and dry. No rash noted.    ED Course  Procedures (including critical care time) Labs Review Labs Reviewed - No data to display Imaging Review No results found.  MDM   1. Back pain    Patient has ttp of the lumbar paraspinal muscles.  No focal neuro deficits on exam.  Ambulates with a steady gait.   No concerning sx's for emergent neurological or infectious process.  Patient has hx  of same and has prescription narcotics from his PMD for his chronic back pain.    Pt advised to f/u with his PMD for recheck and verbalized understanding that ED is not proper location for chronic pain mamangement.  VSS.  Appears stable for d/c    Alexyia Guarino L. Miranda Frese, PA-C 04/03/13 1622

## 2013-04-04 NOTE — ED Provider Notes (Signed)
Medical screening examination/treatment/procedure(s) were performed by non-physician practitioner and as supervising physician I was immediately available for consultation/collaboration.     Lottie Siska R Kirtan Sada, MD 04/04/13 1547 

## 2013-04-08 ENCOUNTER — Emergency Department (HOSPITAL_COMMUNITY): Payer: Medicare Other

## 2013-04-08 ENCOUNTER — Encounter (HOSPITAL_COMMUNITY): Payer: Self-pay | Admitting: *Deleted

## 2013-04-08 ENCOUNTER — Emergency Department (HOSPITAL_COMMUNITY)
Admission: EM | Admit: 2013-04-08 | Discharge: 2013-04-08 | Disposition: A | Discharge status: Home / Self Care | Payer: Medicare Other | Attending: Emergency Medicine | Admitting: Emergency Medicine

## 2013-04-08 DIAGNOSIS — I10 Essential (primary) hypertension: Secondary | ICD-10-CM | POA: Insufficient documentation

## 2013-04-08 DIAGNOSIS — Z87440 Personal history of urinary (tract) infections: Secondary | ICD-10-CM | POA: Insufficient documentation

## 2013-04-08 DIAGNOSIS — Z87828 Personal history of other (healed) physical injury and trauma: Secondary | ICD-10-CM | POA: Insufficient documentation

## 2013-04-08 DIAGNOSIS — J449 Chronic obstructive pulmonary disease, unspecified: Secondary | ICD-10-CM | POA: Insufficient documentation

## 2013-04-08 DIAGNOSIS — Z8719 Personal history of other diseases of the digestive system: Secondary | ICD-10-CM | POA: Insufficient documentation

## 2013-04-08 DIAGNOSIS — M545 Low back pain, unspecified: Secondary | ICD-10-CM | POA: Insufficient documentation

## 2013-04-08 DIAGNOSIS — Z79899 Other long term (current) drug therapy: Secondary | ICD-10-CM | POA: Insufficient documentation

## 2013-04-08 DIAGNOSIS — F17200 Nicotine dependence, unspecified, uncomplicated: Secondary | ICD-10-CM | POA: Insufficient documentation

## 2013-04-08 DIAGNOSIS — Z8669 Personal history of other diseases of the nervous system and sense organs: Secondary | ICD-10-CM | POA: Insufficient documentation

## 2013-04-08 DIAGNOSIS — F1021 Alcohol dependence, in remission: Secondary | ICD-10-CM | POA: Insufficient documentation

## 2013-04-08 DIAGNOSIS — J4489 Other specified chronic obstructive pulmonary disease: Secondary | ICD-10-CM | POA: Insufficient documentation

## 2013-04-08 DIAGNOSIS — Z9861 Coronary angioplasty status: Secondary | ICD-10-CM | POA: Insufficient documentation

## 2013-04-08 DIAGNOSIS — G8929 Other chronic pain: Secondary | ICD-10-CM | POA: Insufficient documentation

## 2013-04-08 LAB — URINALYSIS, ROUTINE W REFLEX MICROSCOPIC
Bilirubin Urine: NEGATIVE
Glucose, UA: NEGATIVE mg/dL
Ketones, ur: NEGATIVE mg/dL
Leukocytes, UA: NEGATIVE
Nitrite: NEGATIVE
Protein, ur: NEGATIVE mg/dL

## 2013-04-08 MED ORDER — CYCLOBENZAPRINE HCL 5 MG PO TABS: 5.0000 mg | ORAL_TABLET | Freq: Three times a day (TID) | ORAL | Status: DC | PRN

## 2013-04-08 MED ORDER — OXYCODONE-ACETAMINOPHEN 5-325 MG PO TABS
2.0000 | ORAL_TABLET | Freq: Once | ORAL | Status: AC
  Administered 2013-04-08: 2 via ORAL
  Filled 2013-04-08: qty 2

## 2013-04-08 NOTE — ED Provider Notes (Signed)
CSN: [id number]     Arrival date & time 04/08/13  1312 History   First MD Initiated Contact with Patient 04/08/13 1419     Chief Complaint  Patient presents with  . Back Pain   (Consider location/radiation/quality/duration/timing/severity/associated sxs/prior Treatment) HPI Comments: Roberto [name] is a 67 y.o. Male presenting with acute on chronic low back pain which has which has been present for the past 3 months since he had to lift his mother off the floor after a fall.  He has been seen for this complaint last week here, then followed up with his pcp 5 days ago at which time he was prescribed hydrocodone.  He reports this medicine has not relieved his pain, and he took his last tablet this morning.   Patient denies any new injury specifically.  There is radiation of pain into his bilateral posterior upper thighs.  There has been no weakness or numbness in the lower extremities and no urinary or bowel retention or incontinence.  Patient does not have a history of cancer or IVDU. He does have a history of liver cirrhosis and history of etoh abuse.         The history is provided by the patient.    Past Medical History  Diagnosis Date  . COPD (chronic obstructive pulmonary disease)   . Hypertension   . Hepatic steatosis 12/14/2011  . Compression fracture of L1 lumbar vertebra 12/14/2011  . C2 cervical fracture   . Duodenal ulcer with hemorrhage 12/15/2011    Likely source of bleeding.per EGD; Dr. Karilyn Cota  . Esophageal varices without bleeding 12/15/2011    per EGD  . Cirrhosis 12/16/2011    per ultrasound  . Hx of substance abuse 12/17/2011  . 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 Hepatic encephalopathy   . Helicobacter pylori antibody positive 12/19/2011  . Encephalopathy 05/18/2012  . UTI (urinary tract infection) 12/13/2011  . Ascites 05/18/2012    S/p paracentesis yielding 7880 mL   Past Surgical History  Procedure Laterality Date  . Coronary  angioplasty with stent placement     No family history on file. History  Substance Use Topics  . Smoking status: Current Every Day Smoker -- 1.00 packs/day    Types: Cigarettes  . Smokeless tobacco: Never Used     Comment: 1 1/2 pack a day  . Alcohol Use: Yes     Comment: Patient states that he drinks 1 beer per weeks    Review of Systems  Constitutional: Negative for fever.  Respiratory: Negative for shortness of breath.   Cardiovascular: Negative for chest pain and leg swelling.  Gastrointestinal: Negative for abdominal pain, constipation and abdominal distention.  Genitourinary: Negative for dysuria, urgency, frequency, flank pain and difficulty urinating.  Musculoskeletal: Positive for back pain. Negative for joint swelling and gait problem.  Skin: Negative for rash.  Neurological: Negative for weakness and numbness.    Allergies  Review of patient's allergies indicates no known allergies.  Home Medications   Current Outpatient Rx  Name  Route  Sig  Dispense  Refill  . ALPRAZolam (XANAX) 1 MG tablet   Oral   Take 0.5-1 mg by mouth at bedtime. Anxiety.         . Fluticasone-Salmeterol (ADVAIR) 250-50 MCG/DOSE AEPB   Inhalation   Inhale 1 puff into the lungs every 12 (twelve) hours.         Marland Kitchen HYDROcodone-acetaminophen (NORCO) 10-325 MG per tablet   Oral   Take 1 tablet  by mouth every 6 (six) hours as needed for pain.         Marland Kitchen lactulose (CHRONULAC) 10 GM/15ML solution   Oral   Take 10 g by mouth 2 (two) times daily as needed (Constipation).         . Multiple Vitamin (MULTIVITAMIN WITH MINERALS) TABS   Oral   Take 1 tablet by mouth daily.         Marland Kitchen olmesartan (BENICAR) 40 MG tablet   Oral   Take 40 mg by mouth daily.         . cyclobenzaprine (FLEXERIL) 5 MG tablet   Oral   Take 1 tablet (5 mg total) by mouth 3 (three) times daily as needed for muscle spasms.   15 tablet   0    BP 139/73  Pulse 64  Temp(Src) 98 F (36.7 C) (Oral)  Resp  18  SpO2 97% Physical Exam  Nursing note and vitals reviewed. Constitutional: He appears well-developed and well-nourished.  Unkempt appearance.  HENT:  Head: Normocephalic.  Eyes: Conjunctivae are normal. No scleral icterus.  Neck: Normal range of motion. Neck supple.  Cardiovascular: Normal rate and intact distal pulses.   Pedal pulses normal.  Pulmonary/Chest: Effort normal.  Abdominal: Soft. Bowel sounds are normal. He exhibits no distension and no mass.  Musculoskeletal: Normal range of motion. He exhibits no edema.       Lumbar back: He exhibits tenderness and bony tenderness. He exhibits no swelling, no edema and no spasm.  Midline ttp lumbar spine.  Neurological: He is alert. He has normal strength. He displays no atrophy and no tremor. No sensory deficit. Gait normal.  Reflex Scores:      Patellar reflexes are 2+ on the right side and 2+ on the left side.      Achilles reflexes are 2+ on the right side and 2+ on the left side. No strength deficit noted in hip and knee flexor and extensor muscle groups.  Ankle flexion and extension intact.  Skin: Skin is warm and dry.  Psychiatric: He has a normal mood and affect.    ED Course  Procedures (including critical care time) Labs Review Labs Reviewed  URINALYSIS, ROUTINE W REFLEX MICROSCOPIC   Imaging Review Dg Lumbar Spine Complete  04/08/2013   *RADIOLOGY REPORT*  Clinical Data: Injured lower back trying to pick up his mother from the floor, severe low back pain  LUMBAR SPINE - COMPLETE 4+ VIEW  Comparison: None Correlation:  CT abdomen and pelvis 05/18/2012  Findings: Osseous demineralization. Five non-rib bearing lumbar vertebrae. Minimal disc space narrowing and endplate spur formation at L3-L4. Disc space narrowing at L4-L5. Anterior height losses at T12 and L1 superior endplates, not significantly changed from prior CT. No definite acute fracture, subluxation or bone destruction. Facet degenerative changes lower lumbar  spine. Scattered atherosclerotic calcifications aorta. No spondylolysis. SI joints preserved.  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 Osseous demineralization with chronic superior endplate height losses at T12-L1 unchanged. No acute abnormalities.   Original Report Authenticated By: Ulyses Southward, M.D.    MDM   1. Chronic back pain    Pt with chronic low back pain.  He has not contacted his pcp to inform him that his current tx has not been helpful.  He was encouraged to do this.  Pt was given percocet while in ed.  He was prescribed flexeril which he states has help some in the past.  Advised that the ed cannot  prescribe him narcotics for this chronic problem and he needs to f/u with his pcp.   Patients labs and/or radiological studies were viewed and considered during the medical decision making and disposition process.  No neuro deficit on exam or by history to suggest emergent or surgical presentation.  Also discussed worsened sx that should prompt immediate re-evaluation including distal weakness, bowel/bladder retention/incontinence.          Burgess Amor, PA-C 04/08/13 2127

## 2013-04-08 NOTE — ED Notes (Signed)
Pt speaks with eyes closed and occ opens his eyes, unkempt , with food in his beard. Says he injured his back 3 mos ago when trying to lift his mother from floor.  "I'm an alcoholic , I had a beer today".  Says that the hydrocodone ordered by Dr Regino Schultze does not help.  Low back pain is c/o

## 2013-04-08 NOTE — ED Notes (Signed)
Pt refused to go to x-ray until he received med for pain

## 2013-04-08 NOTE — ED Notes (Signed)
Pt moaning with pain, alert,.

## 2013-04-08 NOTE — ED Notes (Signed)
Pt c/o lower back pain that started three months ago after attempting to lift his mother out of the floor, pain became worse two days ago, was seen in on 04/03/2013 for same back pain, given IM shot and felt better, did follow up with pcp this past Thursday, was given hydrocodone 10/325mg  with no relief in pain per pt. Last dose was this am.

## 2013-04-10 NOTE — ED Provider Notes (Signed)
Medical screening examination/treatment/procedure(s) were performed by non-physician practitioner and as supervising physician I was immediately available for consultation/collaboration.   Varetta Chavers L Perl Folmar, MD 04/10/13 0802 

## 2013-04-30 ENCOUNTER — Encounter (INDEPENDENT_AMBULATORY_CARE_PROVIDER_SITE_OTHER): Payer: Self-pay | Admitting: *Deleted

## 2013-05-21 ENCOUNTER — Other Ambulatory Visit (HOSPITAL_COMMUNITY): Payer: Self-pay | Admitting: Family Medicine

## 2013-05-21 ENCOUNTER — Ambulatory Visit (HOSPITAL_COMMUNITY)
Admission: RE | Admit: 2013-05-21 | Discharge: 2013-05-21 | Disposition: A | Discharge status: Home / Self Care | Payer: Medicare Other | Source: Ambulatory Visit | Attending: Family Medicine | Admitting: Family Medicine

## 2013-05-21 DIAGNOSIS — M549 Dorsalgia, unspecified: Secondary | ICD-10-CM | POA: Insufficient documentation

## 2013-05-21 DIAGNOSIS — IMO0002 Reserved for concepts with insufficient information to code with codable children: Secondary | ICD-10-CM | POA: Insufficient documentation

## 2013-05-21 DIAGNOSIS — J449 Chronic obstructive pulmonary disease, unspecified: Secondary | ICD-10-CM

## 2013-05-21 DIAGNOSIS — R918 Other nonspecific abnormal finding of lung field: Secondary | ICD-10-CM | POA: Insufficient documentation

## 2013-05-21 DIAGNOSIS — M129 Arthropathy, unspecified: Secondary | ICD-10-CM | POA: Insufficient documentation

## 2013-05-23 ENCOUNTER — Other Ambulatory Visit (HOSPITAL_COMMUNITY): Payer: Self-pay | Admitting: Family Medicine

## 2013-05-23 DIAGNOSIS — R9389 Abnormal findings on diagnostic imaging of other specified body structures: Secondary | ICD-10-CM

## 2013-05-23 DIAGNOSIS — J449 Chronic obstructive pulmonary disease, unspecified: Secondary | ICD-10-CM

## 2013-05-23 DIAGNOSIS — M549 Dorsalgia, unspecified: Secondary | ICD-10-CM

## 2013-05-27 ENCOUNTER — Ambulatory Visit (HOSPITAL_COMMUNITY): Payer: Medicare Other

## 2013-06-09 ENCOUNTER — Ambulatory Visit (HOSPITAL_COMMUNITY): Payer: Medicare Other

## 2013-06-30 ENCOUNTER — Ambulatory Visit (HOSPITAL_COMMUNITY)
Admission: RE | Admit: 2013-06-30 | Discharge: 2013-06-30 | Disposition: A | Discharge status: Home / Self Care | Payer: Medicare Other | Source: Ambulatory Visit | Attending: Internal Medicine | Admitting: Internal Medicine

## 2013-06-30 DIAGNOSIS — R161 Splenomegaly, not elsewhere classified: Secondary | ICD-10-CM | POA: Insufficient documentation

## 2013-06-30 DIAGNOSIS — K8020 Calculus of gallbladder without cholecystitis without obstruction: Secondary | ICD-10-CM | POA: Insufficient documentation

## 2013-06-30 DIAGNOSIS — K7460 Unspecified cirrhosis of liver: Secondary | ICD-10-CM | POA: Insufficient documentation

## 2013-06-30 DIAGNOSIS — R160 Hepatomegaly, not elsewhere classified: Secondary | ICD-10-CM | POA: Insufficient documentation

## 2013-07-15 ENCOUNTER — Encounter (INDEPENDENT_AMBULATORY_CARE_PROVIDER_SITE_OTHER): Payer: Self-pay | Admitting: Internal Medicine

## 2013-07-15 ENCOUNTER — Ambulatory Visit (INDEPENDENT_AMBULATORY_CARE_PROVIDER_SITE_OTHER): Payer: Medicare Other | Admitting: Internal Medicine

## 2013-07-15 VITALS — BP 128/66 | HR 68 | Temp 97.4°F | Resp 16 | Ht 73.0 in | Wt 207.9 lb

## 2013-07-15 DIAGNOSIS — K7030 Alcoholic cirrhosis of liver without ascites: Secondary | ICD-10-CM

## 2013-07-15 NOTE — Progress Notes (Signed)
Presenting complaint;  Followup for alcoholic cirrhosis.  Subjective:  Patient is 67 year old Caucasian male who has alcoholic cirrhosis complicated by ascites and hepatic encephalopathy who presents for scheduled visit. He was last seen 6 months ago. He complains of feeling nervous all the time. He also complains of back pain and weakness. He is taking pain medication 4-5 times a day. He has lost 12 pounds since his last visit. He states he drinks no more than one can of beer a day. He denies nausea vomiting abdominal pain melena or rectal bleeding. He has intermittent constipation. He does not take lactulose regularly.  Current Medications: Current Outpatient Prescriptions  Medication Sig Dispense Refill  . ALPRAZolam (XANAX) 1 MG tablet Take 0.5-1 mg by mouth at bedtime. Anxiety.      . Fluticasone-Salmeterol (ADVAIR) 250-50 MCG/DOSE AEPB Inhale 1 puff into the lungs every 12 (twelve) hours.      Marland Kitchen HYDROcodone-acetaminophen (NORCO) 10-325 MG per tablet Take 1 tablet by mouth every 6 (six) hours as needed for pain.      Marland Kitchen lactulose (CHRONULAC) 10 GM/15ML solution Take 10 g by mouth 2 (two) times daily as needed (Constipation).      . Multiple Vitamin (MULTIVITAMIN WITH MINERALS) TABS Take 1 tablet by mouth.       Marland Kitchen NITROSTAT 0.4 MG SL tablet Place under the tongue every 5 (five) minutes as needed for chest pain.        No current facility-administered medications for this visit.     Objective: Blood pressure 128/66, pulse 68, temperature 97.4 F (36.3 C), temperature source Oral, resp. rate 16, height 6\' 1"  (1.854 m), weight 207 lb 14.4 oz (94.303 kg). Patient is alert and in no acute distress. He does not have asterixis. Conjunctiva is pink. Sclera is nonicteric Oropharyngeal mucosa is normal. No neck masses or thyromegaly noted. Cardiac exam with regular rhythm normal S1 and S2. No murmur or gallop noted. Lungs are clear to auscultation. Abdomen is full but soft and nontender  without organomegaly or masses. Shifting dullness is absent. No LE edema or clubbing noted.  Labs/studies Results: Ultrasound from 07-14-2013 reveals echogenic liver with slightly nodular contour mild hepatomegaly, cholelithiasis and splenomegaly. No ascites noted. Serum ammonia on 01/13/2013 was 30. AFP on 09/17/2012 was 4.7    Assessment:  #1. Alcoholic cirrhosis complicated by ascites and hepatic encephalopathy. Ascites and hepatic encephalopathy have resolved with improvement in hepatic function. He has gone back to drinking alcohol again he is therefore at risk for disease progression.    Plan:  Patient advised that he must not drink alcohol in order to improve his prognosis. He should take lactulose daily; can titrate dose in order to have at least one stool daily. AFP and INR later this month or next. Office visit in 6 months.

## 2013-11-10 ENCOUNTER — Encounter (INDEPENDENT_AMBULATORY_CARE_PROVIDER_SITE_OTHER): Payer: Self-pay

## 2013-11-25 ENCOUNTER — Telehealth (INDEPENDENT_AMBULATORY_CARE_PROVIDER_SITE_OTHER): Payer: Self-pay | Admitting: *Deleted

## 2013-11-25 NOTE — Telephone Encounter (Signed)
[name] Would like the results from Dr. Karilyn Cotaehman. His return phone number [phone].

## 2013-11-26 NOTE — Telephone Encounter (Signed)
There are labs scanned in from April 7,2015. Forwarded to Dr.Rehman to review and advise.I will call patient with his recommendations.

## 2013-12-01 NOTE — Telephone Encounter (Signed)
Patient called and results reviewed with him. LFTs are normal except AST is 51. Hemoglobin is 12.9. Patient with known alcoholic cirrhosis but now with compensated function.

## 2014-01-05 ENCOUNTER — Telehealth (INDEPENDENT_AMBULATORY_CARE_PROVIDER_SITE_OTHER): Payer: Self-pay | Admitting: *Deleted

## 2014-01-05 ENCOUNTER — Ambulatory Visit (INDEPENDENT_AMBULATORY_CARE_PROVIDER_SITE_OTHER): Payer: Medicare Other | Admitting: Internal Medicine

## 2014-01-05 NOTE — Telephone Encounter (Signed)
Phone call left on machine here on Saturday at 2:30  Has a personal emergency and will call back to reschedule  When he is able.

## 2014-01-13 ENCOUNTER — Ambulatory Visit (INDEPENDENT_AMBULATORY_CARE_PROVIDER_SITE_OTHER): Payer: Medicare Other | Admitting: Internal Medicine

## 2014-07-13 ENCOUNTER — Encounter (HOSPITAL_COMMUNITY): Payer: Self-pay | Admitting: Emergency Medicine

## 2014-07-13 ENCOUNTER — Emergency Department (HOSPITAL_COMMUNITY)
Admission: EM | Admit: 2014-07-13 | Discharge: 2014-07-13 | Disposition: A | Discharge status: Home / Self Care | Payer: Medicare Other | Attending: Emergency Medicine | Admitting: Emergency Medicine

## 2014-07-13 DIAGNOSIS — F1193 Opioid use, unspecified with withdrawal: Secondary | ICD-10-CM

## 2014-07-13 DIAGNOSIS — Z008 Encounter for other general examination: Secondary | ICD-10-CM | POA: Diagnosis present

## 2014-07-13 DIAGNOSIS — F1410 Cocaine abuse, uncomplicated: Secondary | ICD-10-CM | POA: Diagnosis not present

## 2014-07-13 DIAGNOSIS — F1110 Opioid abuse, uncomplicated: Secondary | ICD-10-CM | POA: Insufficient documentation

## 2014-07-13 DIAGNOSIS — Z720 Tobacco use: Secondary | ICD-10-CM | POA: Insufficient documentation

## 2014-07-13 DIAGNOSIS — Z7952 Long term (current) use of systemic steroids: Secondary | ICD-10-CM | POA: Diagnosis not present

## 2014-07-13 DIAGNOSIS — J449 Chronic obstructive pulmonary disease, unspecified: Secondary | ICD-10-CM | POA: Insufficient documentation

## 2014-07-13 DIAGNOSIS — Z8781 Personal history of (healed) traumatic fracture: Secondary | ICD-10-CM | POA: Diagnosis not present

## 2014-07-13 DIAGNOSIS — I10 Essential (primary) hypertension: Secondary | ICD-10-CM | POA: Insufficient documentation

## 2014-07-13 DIAGNOSIS — Z9861 Coronary angioplasty status: Secondary | ICD-10-CM | POA: Insufficient documentation

## 2014-07-13 DIAGNOSIS — Z8739 Personal history of other diseases of the musculoskeletal system and connective tissue: Secondary | ICD-10-CM | POA: Insufficient documentation

## 2014-07-13 DIAGNOSIS — F1510 Other stimulant abuse, uncomplicated: Secondary | ICD-10-CM | POA: Insufficient documentation

## 2014-07-13 DIAGNOSIS — F1123 Opioid dependence with withdrawal: Secondary | ICD-10-CM

## 2014-07-13 DIAGNOSIS — F1910 Other psychoactive substance abuse, uncomplicated: Secondary | ICD-10-CM

## 2014-07-13 DIAGNOSIS — K5900 Constipation, unspecified: Secondary | ICD-10-CM | POA: Insufficient documentation

## 2014-07-13 DIAGNOSIS — Z8669 Personal history of other diseases of the nervous system and sense organs: Secondary | ICD-10-CM | POA: Diagnosis not present

## 2014-07-13 DIAGNOSIS — Z79899 Other long term (current) drug therapy: Secondary | ICD-10-CM | POA: Diagnosis not present

## 2014-07-13 DIAGNOSIS — Z87440 Personal history of urinary (tract) infections: Secondary | ICD-10-CM | POA: Diagnosis not present

## 2014-07-13 LAB — CBC WITH DIFFERENTIAL/PLATELET
Basophils Absolute: 0.1 10*3/uL (ref 0.0–0.1)
Basophils Relative: 1 % (ref 0–1)
EOS PCT: 3 % (ref 0–5)
Eosinophils Absolute: 0.2 10*3/uL (ref 0.0–0.7)
HCT: 38.9 % — ABNORMAL LOW (ref 39.0–52.0)
Hemoglobin: 13.6 g/dL (ref 13.0–17.0)
LYMPHS PCT: 34 % (ref 12–46)
Lymphs Abs: 2.1 10*3/uL (ref 0.7–4.0)
MCH: 35.4 pg — ABNORMAL HIGH (ref 26.0–34.0)
MCHC: 35 g/dL (ref 30.0–36.0)
MCV: 101.3 fL — ABNORMAL HIGH (ref 78.0–100.0)
Monocytes Absolute: 0.5 10*3/uL (ref 0.1–1.0)
Monocytes Relative: 8 % (ref 3–12)
NEUTROS PCT: 54 % (ref 43–77)
Neutro Abs: 3.2 10*3/uL (ref 1.7–7.7)
PLATELETS: 101 10*3/uL — AB (ref 150–400)
RBC: 3.84 MIL/uL — AB (ref 4.22–5.81)
RDW: 14 % (ref 11.5–15.5)
WBC: 6.1 10*3/uL (ref 4.0–10.5)

## 2014-07-13 LAB — COMPREHENSIVE METABOLIC PANEL
ALK PHOS: 69 U/L (ref 39–117)
ALT: 25 U/L (ref 0–53)
AST: 48 U/L — AB (ref 0–37)
Albumin: 3.3 g/dL — ABNORMAL LOW (ref 3.5–5.2)
Anion gap: 9 (ref 5–15)
BUN: 8 mg/dL (ref 6–23)
CALCIUM: 8.9 mg/dL (ref 8.4–10.5)
CHLORIDE: 107 meq/L (ref 96–112)
CO2: 27 meq/L (ref 19–32)
Creatinine, Ser: 1.03 mg/dL (ref 0.50–1.35)
GFR calc Af Amer: 85 mL/min — ABNORMAL LOW (ref >90)
GFR, EST NON AFRICAN AMERICAN: 74 mL/min — AB (ref >90)
Glucose, Bld: 90 mg/dL (ref 70–99)
POTASSIUM: 3.7 meq/L (ref 3.7–5.3)
SODIUM: 143 meq/L (ref 137–147)
Total Bilirubin: 1 mg/dL (ref 0.3–1.2)
Total Protein: 7.2 g/dL (ref 6.0–8.3)

## 2014-07-13 LAB — RAPID URINE DRUG SCREEN, HOSP PERFORMED
AMPHETAMINES: NOT DETECTED
BARBITURATES: NOT DETECTED
Benzodiazepines: POSITIVE — AB
Cocaine: POSITIVE — AB
OPIATES: POSITIVE — AB
TETRAHYDROCANNABINOL: NOT DETECTED

## 2014-07-13 LAB — ETHANOL: Alcohol, Ethyl (B): <11 mg/dL (ref 0–11)

## 2014-07-13 MED ORDER — ONDANSETRON 8 MG PO TBDP: 8.0000 mg | ORAL_TABLET | Freq: Three times a day (TID) | ORAL | Status: DC | PRN

## 2014-07-13 NOTE — ED Notes (Signed)
Dr Wickline at bedside,  

## 2014-07-13 NOTE — Discharge Instructions (Signed)
[facility] Resources  [facility]  [employer] [facility] [address]                 [address]         [address]  [place]                                               [place]                              [place] Phone:  [phone]                                  Phone:  [phone]                   Phone:  [phone]  [facility], [phone] - [facility] - [phone]- [phone]       -     [facility] in [place], [address],                                  [phone], [facility]  [place] Child Abuse Hotline [phone]) [phone] or [phone] (After Hours)   Behavioral Health Services  Substance Abuse Resources: - [facility]  [phone] - [facility] [phone] - [facility] [phone] Floydene Flock- [facility] [phone] - Residential & Outpatient Substance Abuse Program  [phone]  Psychological Services: Tressie Ellis- [facility]  [phone] - [facility]  [phone] - [facility], [phone] [place] [address], [place], [place]: [phone] or [phone], [email or web address]  -  - [facility]- [phone] [facility]- [phone] [phone]- [phone]      Finding Treatment for Alcohol and Drug Addiction It can be hard to find the right place to get professional treatment. Here are some important things to consider:  There are different types of treatment to choose from.  Some programs are live-in (residential) while others are not (outpatient). Sometimes a combination is offered.  No single type of program is right for everyone.  Most treatment programs involve a combination of education, counseling, and a 12-step, spiritually-based  approach.  There are non-spiritually based programs (not 12-step).  Some treatment programs are government sponsored. They are geared for patients without private insurance.  Treatment programs can vary in many respects such as:  Cost and types of insurance accepted.  Types of on-site medical services offered.  Length of stay, setting, and size.  Overall philosophy of treatment. A person may need specialized treatment or have needs not addressed by all programs. For example, adolescents need treatment appropriate for their age. Other people have secondary disorders that must be managed as well. Secondary conditions can include mental illness, such as depression or diabetes. Often, a period of detoxification from alcohol or drugs is needed. This requires medical supervision and not all programs offer this. THINGS TO CONSIDER WHEN SELECTING A TREATMENT PROGRAM   Is the program certified by the appropriate government agency? Even private programs must be certified  and employ certified professionals.  Does the program accept your insurance? If not, can a payment plan be set up?  Is the facility clean, organized, and well run? Do they allow you to speak with graduates who can share their treatment experience with you? Can you tour the facility? Can you meet with staff?  Does the program meet the full range of individual needs?  Does the treatment program address sexual orientation and physical disabilities? Do they provide age, gender, and culturally appropriate treatment services?  Is treatment available in languages other than English?  Is long-term aftercare support or guidance encouraged and provided?  Is assessment of an individual's treatment plan ongoing to ensure it meets changing needs?  Does the program use strategies to encourage reluctant patients to remain in treatment long enough to increase the likelihood of success?  Does the program offer counseling (individual or  group) and other behavioral therapies?  Does the program offer medicine as part of the treatment regimen, if needed?  Is there ongoing monitoring of possible relapse? Is there a defined relapse prevention program? Are services or referrals offered to family members to ensure they understand addiction and the recovery process? This would help them support the recovering individual.  Are 12-step meetings held at the center or is transport available for patients to attend outside meetings? In countries outside of the [place], Magazine features editorsee local directories for contact information for services in your area. Document Released: 06/15/2005 Document Revised: 10/09/2011 Document Reviewed: 12/26/2007 [facility] Patient Information 2015 [employer] [place]. This information is not intended to replace advice given to you by your health care provider. Make sure you discuss any questions you have with your health care provider.

## 2014-07-13 NOTE — ED Notes (Signed)
Pt ambulatory in room, gait steady, pt called for ride while RN in room, Ed Carola FrostHandy will be coming to pick pt up, pt refused offer of wheelchair, ambulatory to waiting room,

## 2014-07-13 NOTE — ED Notes (Signed)
Patient reports drinking two 24oz beers a day; last night drank ten 12oz beers in an attempt to fall asleep due to insomnia. Denies SI/HI. Also takes Hydrocodone and Xanax, last dose of hydrocodone was 3 days ago, last Xanax was today. He is requesting detox for pain medication.

## 2014-07-13 NOTE — ED Notes (Signed)
Pt reports he is out of pain meds and anxiety medication and needs detox. Pt also reports problems with alcohol, last drink last night.

## 2014-07-13 NOTE — ED Notes (Signed)
Belongings:  1 pair of black shoes 1 pair of black pants 1 pair of broken glasses 1 [employer] cell phone 1 black wallet with 1 dollar bill, ID card, and RX card Green collar shirt 1 pair of socks  All locked up in locker

## 2014-07-13 NOTE — ED Provider Notes (Signed)
CSN: [id number]     Arrival date & time 07/13/14  1451 History   First MD Initiated Contact with Patient 07/13/14 1720     Chief Complaint  Patient presents with  . Addiction Problem     Patient is a 68 y.o. male presenting with drug problem. The history is provided by the patient.  Drug Problem This is a chronic problem. The current episode started more than 1 week ago. The problem occurs daily. Pertinent negatives include no chest pain, no abdominal pain, no headaches and no shortness of breath. Nothing aggravates the symptoms. Nothing relieves the symptoms.  Patient presents requesting detox from multiple medications  He reports he takes hydrocodone daily for chronic pain, last dose 2 days ago He reports he uses xanax daily and last dose today He reports he drinks ETOH but not every day, last drink was last night.  He reports nausea but no vomiting No fever No CP/SOB No seizures are reported  He denies suicidal ideation He denies access to weapons He denies previous suicide attempt   Past Medical History  Diagnosis Date  . COPD (chronic obstructive pulmonary disease)   . Hypertension   . Hepatic steatosis 12/14/2011  . Compression fracture of L1 lumbar vertebra 12/14/2011  . C2 cervical fracture   . Duodenal ulcer with hemorrhage 12/15/2011    Likely source of bleeding.per EGD; Dr. Karilyn Cotaehman  . Esophageal varices without bleeding 12/15/2011    per EGD  . Cirrhosis 12/16/2011    per ultrasound  . Hx of substance abuse 12/17/2011  . 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 Hepatic encephalopathy   . Helicobacter pylori antibody positive 12/19/2011  . Encephalopathy 05/18/2012  . UTI (urinary tract infection) 12/13/2011  . Ascites 05/18/2012    S/p paracentesis yielding 7880 mL   Past Surgical History  Procedure Laterality Date  . Coronary angioplasty with stent placement     History reviewed. No pertinent family history. History  Substance  Use Topics  . Smoking status: Current Every Day Smoker -- 1.00 packs/day    Types: Cigarettes  . Smokeless tobacco: Never Used     Comment: 1 1/2 pack a day  . Alcohol Use: Yes     Comment: Patient states that he drinks 15 beers a week    Review of Systems  Constitutional: Negative for fever.  Respiratory: Negative for shortness of breath.   Cardiovascular: Negative for chest pain.  Gastrointestinal: Positive for constipation. Negative for vomiting and abdominal pain.  Neurological: Negative for seizures and headaches.  All other systems reviewed and are negative.     Allergies  Review of patient's allergies indicates no known allergies.  Home Medications   Prior to Admission medications   Medication Sig Start Date End Date Taking? Authorizing Provider  ALPRAZolam Prudy Feeler(XANAX) 1 MG tablet Take 0.5-1 mg by mouth at bedtime. Anxiety.    Historical Provider, MD  Fluticasone-Salmeterol (ADVAIR) 250-50 MCG/DOSE AEPB Inhale 1 puff into the lungs every 12 (twelve) hours.    Historical Provider, MD  HYDROcodone-acetaminophen (NORCO) 10-325 MG per tablet Take 1 tablet by mouth every 6 (six) hours as needed for pain.    Historical Provider, MD  lactulose (CHRONULAC) 10 GM/15ML solution Take 10 g by mouth 2 (two) times daily as needed (Constipation). 01/13/13   Malissa HippoNajeeb U Rehman, MD  Multiple Vitamin (MULTIVITAMIN WITH MINERALS) TABS Take 1 tablet by mouth.     Historical Provider, MD  NITROSTAT 0.4 MG SL tablet Place under the tongue  every 5 (five) minutes as needed for chest pain.  06/28/13   Historical Provider, MD   BP 136/81 mmHg  Pulse 56  Temp(Src) 97.5 F (36.4 C) (Oral)  Resp 16  Ht 6\' 1"  (1.854 m)  Wt 210 lb (95.255 kg)  BMI 27.71 kg/m2  SpO2 99% Physical Exam CONSTITUTIONAL: elderly, no distress noted HEAD: Normocephalic/atraumatic EYES: EOMI/PERRL ENMT: Mucous membranes moist NECK: supple no meningeal signs CV: S1/S2 noted, no murmurs/rubs/gallops noted LUNGS: Lungs are  clear to auscultation bilaterally, no apparent distress ABDOMEN: soft, nontender, no rebound or guarding, bowel sounds noted throughout abdomen NEURO: Pt is awake/alert/appropriate, moves all extremitiesx4.  No tremor noted EXTREMITIES: pulses normal/equal, full ROM SKIN: warm, color normal PSYCH: no abnormalities of mood noted, alert and oriented to situation  ED Course  Procedures  Labs Review Labs Reviewed  CBC WITH DIFFERENTIAL - Abnormal; Notable for the following:    RBC 3.84 (*)    HCT 38.9 (*)    MCV 101.3 (*)    MCH 35.4 (*)    Platelets 101 (*)    All other components within normal limits  COMPREHENSIVE METABOLIC PANEL - Abnormal; Notable for the following:    Albumin 3.3 (*)    AST 48 (*)    GFR calc non Af Amer 74 (*)    GFR calc Af Amer 85 (*)    All other components within normal limits  URINE RAPID DRUG SCREEN (HOSP PERFORMED) - Abnormal; Notable for the following:    Opiates POSITIVE (*)    Cocaine POSITIVE (*)    Benzodiazepines POSITIVE (*)    All other components within normal limits  ETHANOL    7:15 PM Pt is not showing any significant signs of withdrawal He has no tremor.  He is not vomiting.   He denies seizure It appears his main issue is opiate withdrawal He does not appear to have any acute life threatening withdrawal He is awake/alert and ambulatory He denies SI He was given referral to outpatient followup  MDM   Final diagnoses:  Polysubstance abuse  Opioid withdrawal    Nursing notes including past medical history and social history reviewed and considered in documentation Labs/vital reviewed myself and considered during evaluation     Joya Gaskinsonald W Mercadez Heitman, MD 07/13/14 Ernestina Columbia1922

## 2014-07-23 ENCOUNTER — Emergency Department (HOSPITAL_COMMUNITY)
Admission: EM | Admit: 2014-07-23 | Discharge: 2014-07-23 | Disposition: A | Discharge status: Home / Self Care | Payer: Medicare Other | Attending: Emergency Medicine | Admitting: Emergency Medicine

## 2014-07-23 ENCOUNTER — Emergency Department (HOSPITAL_COMMUNITY): Payer: Medicare Other

## 2014-07-23 ENCOUNTER — Encounter (HOSPITAL_COMMUNITY): Payer: Self-pay

## 2014-07-23 DIAGNOSIS — Z720 Tobacco use: Secondary | ICD-10-CM | POA: Diagnosis not present

## 2014-07-23 DIAGNOSIS — F10120 Alcohol abuse with intoxication, uncomplicated: Secondary | ICD-10-CM | POA: Insufficient documentation

## 2014-07-23 DIAGNOSIS — Z8669 Personal history of other diseases of the nervous system and sense organs: Secondary | ICD-10-CM | POA: Diagnosis not present

## 2014-07-23 DIAGNOSIS — Z79899 Other long term (current) drug therapy: Secondary | ICD-10-CM | POA: Insufficient documentation

## 2014-07-23 DIAGNOSIS — Z8719 Personal history of other diseases of the digestive system: Secondary | ICD-10-CM | POA: Diagnosis not present

## 2014-07-23 DIAGNOSIS — J449 Chronic obstructive pulmonary disease, unspecified: Secondary | ICD-10-CM | POA: Insufficient documentation

## 2014-07-23 DIAGNOSIS — Z8781 Personal history of (healed) traumatic fracture: Secondary | ICD-10-CM | POA: Diagnosis not present

## 2014-07-23 DIAGNOSIS — D696 Thrombocytopenia, unspecified: Secondary | ICD-10-CM | POA: Diagnosis not present

## 2014-07-23 DIAGNOSIS — R079 Chest pain, unspecified: Secondary | ICD-10-CM

## 2014-07-23 DIAGNOSIS — F0390 Unspecified dementia without behavioral disturbance: Secondary | ICD-10-CM | POA: Diagnosis not present

## 2014-07-23 DIAGNOSIS — D7589 Other specified diseases of blood and blood-forming organs: Secondary | ICD-10-CM | POA: Diagnosis not present

## 2014-07-23 DIAGNOSIS — R740 Nonspecific elevation of levels of transaminase and lactic acid dehydrogenase [LDH]: Secondary | ICD-10-CM

## 2014-07-23 DIAGNOSIS — F10920 Alcohol use, unspecified with intoxication, uncomplicated: Secondary | ICD-10-CM

## 2014-07-23 DIAGNOSIS — I10 Essential (primary) hypertension: Secondary | ICD-10-CM | POA: Insufficient documentation

## 2014-07-23 DIAGNOSIS — Z87440 Personal history of urinary (tract) infections: Secondary | ICD-10-CM | POA: Insufficient documentation

## 2014-07-23 DIAGNOSIS — Z7952 Long term (current) use of systemic steroids: Secondary | ICD-10-CM | POA: Diagnosis not present

## 2014-07-23 DIAGNOSIS — Z9861 Coronary angioplasty status: Secondary | ICD-10-CM | POA: Insufficient documentation

## 2014-07-23 DIAGNOSIS — R7401 Elevation of levels of liver transaminase levels: Secondary | ICD-10-CM

## 2014-07-23 LAB — COMPREHENSIVE METABOLIC PANEL
ALBUMIN: 3.5 g/dL (ref 3.5–5.2)
ALK PHOS: 67 U/L (ref 39–117)
ALT: 40 U/L (ref 0–53)
AST: 64 U/L — ABNORMAL HIGH (ref 0–37)
Anion gap: 7 (ref 5–15)
BUN: 8 mg/dL (ref 6–23)
CO2: 24 mmol/L (ref 19–32)
Calcium: 8.1 mg/dL — ABNORMAL LOW (ref 8.4–10.5)
Chloride: 107 mEq/L (ref 96–112)
Creatinine, Ser: 0.85 mg/dL (ref 0.50–1.35)
GFR calc Af Amer: >90 mL/min (ref >90)
GFR calc non Af Amer: 89 mL/min — ABNORMAL LOW (ref >90)
GLUCOSE: 93 mg/dL (ref 70–99)
POTASSIUM: 3.5 mmol/L (ref 3.5–5.1)
SODIUM: 138 mmol/L (ref 135–145)
TOTAL PROTEIN: 7.1 g/dL (ref 6.0–8.3)
Total Bilirubin: 0.6 mg/dL (ref 0.3–1.2)

## 2014-07-23 LAB — CBC WITH DIFFERENTIAL/PLATELET
BASOS PCT: 1 % (ref 0–1)
Basophils Absolute: 0.1 10*3/uL (ref 0.0–0.1)
Eosinophils Absolute: 0.2 10*3/uL (ref 0.0–0.7)
Eosinophils Relative: 2 % (ref 0–5)
HCT: 39.7 % (ref 39.0–52.0)
Hemoglobin: 13.7 g/dL (ref 13.0–17.0)
LYMPHS ABS: 2.7 10*3/uL (ref 0.7–4.0)
Lymphocytes Relative: 32 % (ref 12–46)
MCH: 35.1 pg — ABNORMAL HIGH (ref 26.0–34.0)
MCHC: 34.5 g/dL (ref 30.0–36.0)
MCV: 101.8 fL — ABNORMAL HIGH (ref 78.0–100.0)
Monocytes Absolute: 0.8 10*3/uL (ref 0.1–1.0)
Monocytes Relative: 9 % (ref 3–12)
NEUTROS PCT: 56 % (ref 43–77)
Neutro Abs: 4.8 10*3/uL (ref 1.7–7.7)
PLATELETS: 126 10*3/uL — AB (ref 150–400)
RBC: 3.9 MIL/uL — AB (ref 4.22–5.81)
RDW: 14.1 % (ref 11.5–15.5)
WBC: 8.6 10*3/uL (ref 4.0–10.5)

## 2014-07-23 LAB — ETHANOL: Alcohol, Ethyl (B): 195 mg/dL — ABNORMAL HIGH (ref 0–9)

## 2014-07-23 LAB — TROPONIN I: Troponin I: <0.03 ng/mL (ref <0.031)

## 2014-07-23 LAB — D-DIMER, QUANTITATIVE: D-Dimer, Quant: 0.63 ug/mL-FEU — ABNORMAL HIGH (ref 0.00–0.48)

## 2014-07-23 MED ORDER — GI COCKTAIL ~~LOC~~
30.0000 mL | Freq: Once | ORAL | Status: AC
  Administered 2014-07-23: 30 mL via ORAL
  Filled 2014-07-23: qty 30

## 2014-07-23 MED ORDER — ASPIRIN 81 MG PO CHEW
324.0000 mg | CHEWABLE_TABLET | Freq: Once | ORAL | Status: AC
  Administered 2014-07-23: 324 mg via ORAL

## 2014-07-23 MED ORDER — CLONIDINE HCL 0.2 MG PO TABS: 0.2000 mg | ORAL_TABLET | Freq: Two times a day (BID) | ORAL | Status: DC

## 2014-07-23 MED ORDER — IOHEXOL 350 MG/ML SOLN
100.0000 mL | Freq: Once | INTRAVENOUS | Status: AC | PRN
Start: 2014-07-23 — End: 2014-07-23
  Administered 2014-07-23: 100 mL via INTRAVENOUS

## 2014-07-23 MED ORDER — KETOROLAC TROMETHAMINE 30 MG/ML IJ SOLN
30.0000 mg | Freq: Once | INTRAMUSCULAR | Status: AC
  Administered 2014-07-23: 30 mg via INTRAVENOUS
  Filled 2014-07-23: qty 1

## 2014-07-23 MED ORDER — NITROGLYCERIN 0.4 MG SL SUBL
0.4000 mg | SUBLINGUAL_TABLET | SUBLINGUAL | Status: DC | PRN
  Administered 2014-07-23 (×2): 0.4 mg via SUBLINGUAL
  Filled 2014-07-23: qty 1

## 2014-07-23 MED ORDER — HYDROCODONE-ACETAMINOPHEN 5-325 MG PO TABS
2.0000 | ORAL_TABLET | Freq: Once | ORAL | Status: AC
  Administered 2014-07-23: 2 via ORAL
  Filled 2014-07-23: qty 2

## 2014-07-23 MED ORDER — PANTOPRAZOLE SODIUM 40 MG PO TBEC: 40.0000 mg | DELAYED_RELEASE_TABLET | Freq: Every day | ORAL | Status: DC

## 2014-07-23 MED ORDER — CLONIDINE HCL 0.2 MG PO TABS
0.2000 mg | ORAL_TABLET | Freq: Once | ORAL | Status: AC
  Administered 2014-07-23: 0.2 mg via ORAL
  Filled 2014-07-23: qty 1

## 2014-07-23 MED ORDER — NAPROXEN 500 MG PO TABS: 500.0000 mg | ORAL_TABLET | Freq: Two times a day (BID) | ORAL | Status: DC

## 2014-07-23 NOTE — ED Notes (Signed)
Pt requesting 2nd dose of GI cocktail.  Dr Preston FleetingGlick informed.

## 2014-07-23 NOTE — ED Notes (Addendum)
Onset mid chest pain while at rest 3pm. Denies n/v or sob. States he's trying to "detox " himself off vicodin. Last one was 1 week ago.  Has been drinking wine tonight ' Probably about a fifth"

## 2014-07-23 NOTE — ED Provider Notes (Signed)
CSN: [id number]     Arrival date & time 07/23/14  0140 History   None    Chief Complaint  Patient presents with  . Chest Pain     (Consider location/radiation/quality/duration/timing/severity/associated sxs/prior Treatment) Patient is a 68 y.o. male presenting with chest pain. The history is provided by the patient.  Chest Pain He complains of waxing and waning left anterior chest pain since about 3 PM. Pain is dull and he rates it at 8/10. Nothing seems to make it better nothing seems to make worse. He denies associated dyspnea, nausea, diaphoresis. He does state that he had a cardiac stent placed 10 years ago. He is also trying to work drawn from narcotics and states she has not had any hydrocodone in the last 5 days. However, he has had cocaine today and he admits to drinking a fifth of wine today. He still smokes 1 pack of cigarettes a day. He denies history of hypertension, diabetes, hyperlipidemia.  Past Medical History  Diagnosis Date  . COPD (chronic obstructive pulmonary disease)   . Hypertension   . Hepatic steatosis 12/14/2011  . Compression fracture of L1 lumbar vertebra 12/14/2011  . C2 cervical fracture   . Duodenal ulcer with hemorrhage 12/15/2011    Likely source of bleeding.per EGD; Dr. Karilyn Cota  . Esophageal varices without bleeding 12/15/2011    per EGD  . Cirrhosis 12/16/2011    per ultrasound  . Hx of substance abuse 12/17/2011  . 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 Hepatic encephalopathy   . Helicobacter pylori antibody positive 12/19/2011  . Encephalopathy 05/18/2012  . UTI (urinary tract infection) 12/13/2011  . Ascites 05/18/2012    S/p paracentesis yielding 7880 mL   Past Surgical History  Procedure Laterality Date  . Coronary angioplasty with stent placement     No family history on file. History  Substance Use Topics  . Smoking status: Current Every Day Smoker -- 1.00 packs/day    Types: Cigarettes  . Smokeless  tobacco: Never Used     Comment: 1 1/2 pack a day  . Alcohol Use: Yes     Comment: Patient states that he drinks 15 beers a week    Review of Systems  Cardiovascular: Positive for chest pain.  All other systems reviewed and are negative.     Allergies  Review of patient's allergies indicates no known allergies.  Home Medications   Prior to Admission medications   Medication Sig Start Date End Date Taking? Authorizing Provider  ALPRAZolam Prudy Feeler) 1 MG tablet Take 0.5-1 mg by mouth at bedtime. Anxiety.    Historical Provider, MD  Fluticasone-Salmeterol (ADVAIR) 250-50 MCG/DOSE AEPB Inhale 1 puff into the lungs every 12 (twelve) hours.    Historical Provider, MD  HYDROcodone-acetaminophen (NORCO) 10-325 MG per tablet Take 1 tablet by mouth every 6 (six) hours as needed for pain.    Historical Provider, MD  lactulose (CHRONULAC) 10 GM/15ML solution Take 10 g by mouth 2 (two) times daily as needed (Constipation). 01/13/13   Malissa Hippo, MD  Multiple Vitamin (MULTIVITAMIN WITH MINERALS) TABS Take 1 tablet by mouth.     Historical Provider, MD  NITROSTAT 0.4 MG SL tablet Place under the tongue every 5 (five) minutes as needed for chest pain.  06/28/13   Historical Provider, MD  ondansetron (ZOFRAN ODT) 8 MG disintegrating tablet Take 1 tablet (8 mg total) by mouth every 8 (eight) hours as needed. 8mg  ODT q4 hours prn nausea 07/13/14   Suella Grove  Wickline, MD   BP 122/87 mmHg  Pulse 81  Temp(Src) 98.2 F (36.8 C) (Oral)  Resp 15  Ht 6' (1.829 m)  Wt 210 lb (95.255 kg)  BMI 28.47 kg/m2  SpO2 95% Physical Exam  Nursing note and vitals reviewed.  [age] year old male, is clinically intoxicated, but resting comfortably and in no acute distress. Vital signs are normal. Oxygen saturation is 95%, which is normal. Head is normocephalic and atraumatic. PERRLA, EOMI. Oropharynx is clear. Neck is nontender and supple without adenopathy or JVD. Back is nontender and there is no CVA  tenderness. Lungs are clear without rales, wheezes, or rhonchi. Chest is nontender. Heart has regular rate and rhythm without murmur. Abdomen is soft, flat, nontender without masses or hepatosplenomegaly and peristalsis is normoactive. Extremities have no cyanosis or edema, full range of motion is present. Skin is warm and dry without rash. Neurologic: He is clinically intoxicated, but speech is normal, cranial nerves are intact, there are no motor or sensory deficits.  ED Course  Procedures (including critical care time) Labs Review Results for orders placed or performed during the hospital encounter of 07/23/14  CBC with Differential  Result Value Ref Range   WBC 8.6 4.0 - 10.5 K/uL   RBC 3.90 (L) 4.22 - 5.81 MIL/uL   Hemoglobin 13.7 13.0 - 17.0 g/dL   HCT 95.639.7 21.339.0 - 08.652.0 %   MCV 101.8 (H) 78.0 - 100.0 fL   MCH 35.1 (H) 26.0 - 34.0 pg   MCHC 34.5 30.0 - 36.0 g/dL   RDW 57.814.1 46.911.5 - 62.915.5 %   Platelets 126 (L) 150 - 400 K/uL   Neutrophils Relative % 56 43 - 77 %   Neutro Abs 4.8 1.7 - 7.7 K/uL   Lymphocytes Relative 32 12 - 46 %   Lymphs Abs 2.7 0.7 - 4.0 K/uL   Monocytes Relative 9 3 - 12 %   Monocytes Absolute 0.8 0.1 - 1.0 K/uL   Eosinophils Relative 2 0 - 5 %   Eosinophils Absolute 0.2 0.0 - 0.7 K/uL   Basophils Relative 1 0 - 1 %   Basophils Absolute 0.1 0.0 - 0.1 K/uL  Comprehensive metabolic panel  Result Value Ref Range   Sodium 138 135 - 145 mmol/L   Potassium 3.5 3.5 - 5.1 mmol/L   Chloride 107 96 - 112 mEq/L   CO2 24 19 - 32 mmol/L   Glucose, Bld 93 70 - 99 mg/dL   BUN 8 6 - 23 mg/dL   Creatinine, Ser 5.280.85 0.50 - 1.35 mg/dL   Calcium 8.1 (L) 8.4 - 10.5 mg/dL   Total Protein 7.1 6.0 - 8.3 g/dL   Albumin 3.5 3.5 - 5.2 g/dL   AST 64 (H) 0 - 37 U/L   ALT 40 0 - 53 U/L   Alkaline Phosphatase 67 39 - 117 U/L   Total Bilirubin 0.6 0.3 - 1.2 mg/dL   GFR calc non Af Amer 89 (L) >90 mL/min   GFR calc Af Amer >90 >90 mL/min   Anion gap 7 5 - 15  Troponin I   Result Value Ref Range   Troponin I <0.03 <0.031 ng/mL  D-dimer, quantitative  Result Value Ref Range   D-Dimer, Quant 0.63 (H) 0.00 - 0.48 ug/mL-FEU  Ethanol  Result Value Ref Range   Alcohol, Ethyl (B) 195 (H) 0 - 9 mg/dL  Troponin I  Result Value Ref Range   Troponin I <0.03 <0.031 ng/mL   Imaging Review Dg  Chest 2 View  07/23/2014   CLINICAL DATA:  Acute onset of generalized chest pain and left arm pain. Initial encounter.  EXAM: CHEST  2 VIEW  COMPARISON:  Chest radiograph performed 05/21/2013  FINDINGS: The lungs are well-aerated. A nodular density is again suggested at the left lung base, difficult to fully assess on this study. There is no evidence of focal opacification, pleural effusion or pneumothorax. The lung bases are incompletely imaged on this study.  The heart is normal in size; the mediastinal contour is within normal limits. No acute osseous abnormalities are seen.  IMPRESSION: 1. No acute cardiopulmonary process seen. 2. Nodular density again suggested at the lung base, as noted in 2014, difficult to fully assess on this study. CT of the chest would be helpful for further evaluation, on an elective nonemergent basis.   Electronically Signed   By: Roanna Raider M.D.   On: 07/23/2014 03:23   Ct Angio Chest Pe W/cm &/or Wo Cm  07/23/2014   CLINICAL DATA:  Acute onset of generalized chest pain and left arm pain. Elevated D-dimer. Initial encounter.  EXAM: CT ANGIOGRAPHY CHEST WITH CONTRAST  TECHNIQUE: Multidetector CT imaging of the chest was performed using the standard protocol during bolus administration of intravenous contrast. Multiplanar CT image reconstructions and MIPs were obtained to evaluate the vascular anatomy.  CONTRAST:  OMNIPAQUE IOHEXOL 350 MG/ML SOLN  COMPARISON:  Chest radiograph performed earlier today at 2:41 a.m., and CT of the chest performed 01/08/2012  FINDINGS: There is no evidence of pulmonary embolus.  Minimal opacity at the lower lobes  bilaterally is thought to reflect atelectasis. A 6 mm hazy nodule is noted at the left lung apex (image 15 of 104); this is stable from 2013 and likely benign. There is no evidence of significant focal consolidation, pleural effusion or pneumothorax. No masses are identified; no abnormal focal contrast enhancement is seen.  There is mild dilatation of the ascending thoracic aorta to 3.9 cm in AP dimension without evidence of aneurysmal dilatation. Diffuse coronary artery calcifications are seen. The mediastinum is otherwise unremarkable. No mediastinal lymphadenopathy is seen. No pericardial effusion is identified. Scattered calcific atherosclerotic disease is noted at the proximal great vessels. No axillary lymphadenopathy is seen. The visualized portions of the thyroid gland are unremarkable in appearance.  There is a diffusely nodular contour to the liver, with hepatic cirrhosis. The liver appears mildly enlarged. The spleen also appears enlarged.  No acute osseous abnormalities are seen.  Review of the MIP images confirms the above findings.  IMPRESSION: 1. No evidence of pulmonary embolus. 2. Minimal opacity at the lower lobes bilaterally is thought to reflect atelectasis. 3. 6 mm hazy nodule at the left lung apex is stable from 2013 and likely benign. 4. Mild dilatation of the ascending thoracic aorta to 3.9 cm in AP dimension, without evidence of aneurysmal dilatation. Recommend annual imaging followup by CTA or MRA. This recommendation follows 2010 ACCF/AHA/AATS/ACR/ASA/SCA/SCAI/SIR/STS/SVM Guidelines for the Diagnosis and Management of Patients With Thoracic Aortic Disease. Circulation. 2010; 121: e266-e369 5. Diffuse coronary artery calcifications seen. 6. Findings of hepatic cirrhosis noted. Mild splenomegaly and hepatomegaly noted.   Electronically Signed   By: Roanna Raider M.D.   On: 07/23/2014 05:27     EKG Interpretation   Date/Time:  Thursday July 23 2014 01:42:19 EST Ventricular Rate:   80 PR Interval:  189 QRS Duration: 89 QT Interval:  449 QTC Calculation: 518 R Axis:   19 Text Interpretation:  Sinus rhythm Prolonged  QT interval When compared  with ECG of 11/18/2012, QT has lengthened Confirmed by [facility]  MD, Traniyah Hallett  ([id number]) on 07/23/2014 1:46:17 AM      MDM   Final diagnoses:  Chest pain, unspecified chest pain type  Alcohol intoxication, uncomplicated  Elevated AST (SGOT)  Thrombocytopenia  Macrocytosis    Chest pain of uncertain cause. No ECG evidence of ischemia. At the moment, I doubt cardiac etiology. He is given aspirin and will be given a trial of ketorolac. Old records are reviewed and I do not see any visits for chest pain but he has been in the ED for substance abuse problems.  He did not get pain relief from ketorolac and Nordette to get relief from nitroglycerin. Troponin came back normal. D-dimer is come back elevated present for CT angiogram which showed no evidence of pulmonary embolism or acute cardiac or pulmonary pathology. Troponin was repeated and is still normal. I went in to talk with the patient and he states that the reason that he has not taken his hydrocodone in the last 5 days is that he had run out and his prescription is due to be refilled in 2 days at which time he will get another 150 hydrocodone tablets. I believe at this point that most of the symptoms are related to narcotic withdrawal. He is discharged with a prescription for clonidine, naproxen, and pantoprazole and is referred back to his PCP.  Dione Boozeavid Tranae [place], MD 07/23/14 [phone]

## 2014-07-23 NOTE — ED Notes (Signed)
ermd notified of pt's pain

## 2014-07-23 NOTE — ED Notes (Signed)
Now requesting something to help him burp. ermd notified

## 2014-07-23 NOTE — Discharge Instructions (Signed)
Chest Pain (Nonspecific) °It is often hard to give a specific diagnosis for the cause of chest pain. There is always a chance that your pain could be related to something serious, such as a heart attack or a blood clot in the lungs. You need to follow up with your health care provider for further evaluation. °CAUSES  °· Heartburn. °· Pneumonia or bronchitis. °· Anxiety or stress. °· Inflammation around your heart (pericarditis) or lung (pleuritis or pleurisy). °· A blood clot in the lung. °· A collapsed lung (pneumothorax). It can develop suddenly on its own (spontaneous pneumothorax) or from trauma to the chest. °· Shingles infection (herpes zoster virus). °The chest wall is composed of bones, muscles, and cartilage. Any of these can be the source of the pain. °· The bones can be bruised by injury. °· The muscles or cartilage can be strained by coughing or overwork. °· The cartilage can be affected by inflammation and become sore (costochondritis). °DIAGNOSIS  °Lab tests or other studies may be needed to find the cause of your pain. Your health care provider may have you take a test called an ambulatory electrocardiogram (ECG). An ECG records your heartbeat patterns over a 24-hour period. You may also have other tests, such as: °· Transthoracic echocardiogram (TTE). During echocardiography, sound waves are used to evaluate how blood flows through your heart. °· Transesophageal echocardiogram (TEE). °· Cardiac monitoring. This allows your health care provider to monitor your heart rate and rhythm in real time. °· Holter monitor. This is a portable device that records your heartbeat and can help diagnose heart arrhythmias. It allows your health care provider to track your heart activity for several days, if needed. °· Stress tests by exercise or by giving medicine that makes the heart beat faster. °TREATMENT  °· Treatment depends on what may be causing your chest pain. Treatment may include: °¨ Acid blockers for  heartburn. °¨ Anti-inflammatory medicine. °¨ Pain medicine for inflammatory conditions. °¨ Antibiotics if an infection is present. °· You may be advised to change lifestyle habits. This includes stopping smoking and avoiding alcohol, caffeine, and chocolate. °· You may be advised to keep your head raised (elevated) when sleeping. This reduces the chance of acid going backward from your stomach into your esophagus. °Most of the time, nonspecific chest pain will improve within 2-3 days with rest and mild pain medicine.  °HOME CARE INSTRUCTIONS  °· If antibiotics were prescribed, take them as directed. Finish them even if you start to feel better. °· For the next few days, avoid physical activities that bring on chest pain. Continue physical activities as directed. °· Do not use any tobacco products, including cigarettes, chewing tobacco, or electronic cigarettes. °· Avoid drinking alcohol. °· Only take medicine as directed by your health care provider. °· Follow your health care provider's suggestions for further testing if your chest pain does not go away. °· Keep any follow-up appointments you made. If you do not go to an appointment, you could develop lasting (chronic) problems with pain. If there is any problem keeping an appointment, call to reschedule. °SEEK MEDICAL CARE IF:  °· Your chest pain does not go away, even after treatment. °· You have a rash with blisters on your chest. °· You have a fever. °SEEK IMMEDIATE MEDICAL CARE IF:  °· You have increased chest pain or pain that spreads to your arm, neck, jaw, back, or abdomen. °· You have shortness of breath. °· You have an increasing cough, or you cough   up blood.  You have severe back or abdominal pain.  You feel nauseous or vomit.  You have severe weakness.  You faint.  You have chills. This is an emergency. Do not wait to see if the pain will go away. Get medical help at once. Call your local emergency services (911 in [place] Do not drive  yourself to the hospital. MAKE SURE YOU:   Understand these instructions.  Will watch your condition.  Will get help right away if you are not doing well or get worse. Document Released: 04/26/2005 Document Revised: 07/22/2013 Document Reviewed: 02/20/2008 [facility] Patient Information 2015 [employer], [place]. This information is not intended to replace advice given to you by your health care provider. Make sure you discuss any questions you have with your health care provider.  Alcohol Use Disorder Alcohol use disorder is a mental disorder. It is not a one-time incident of heavy drinking. Alcohol use disorder is the excessive and uncontrollable use of alcohol over time that leads to problems with functioning in one or more areas of daily living. People with this disorder risk harming themselves and others when they drink to excess. Alcohol use disorder also can cause other mental disorders, such as mood and anxiety disorders, and serious physical problems. People with alcohol use disorder often misuse other drugs.  Alcohol use disorder is common and widespread. Some people with this disorder drink alcohol to cope with or escape from negative life events. Others drink to relieve chronic pain or symptoms of mental illness. People with a family history of alcohol use disorder are at higher risk of losing control and using alcohol to excess.  SYMPTOMS  Signs and symptoms of alcohol use disorder may include the following:   Consumption ofalcohol inlarger amounts or over a longer period of time than intended.  Multiple unsuccessful attempts to cutdown or control alcohol use.   A great deal of time spent obtaining alcohol, using alcohol, or recovering from the effects of alcohol (hangover).  A strong desire or urge to use alcohol (cravings).   Continued use of alcohol despite problems at work, school, or home because of alcohol use.   Continued use of alcohol despite problems in relationships  because of alcohol use.  Continued use of alcohol in situations when it is physically hazardous, such as driving a car.  Continued use of alcohol despite awareness of a physical or psychological problem that is likely related to alcohol use. Physical problems related to alcohol use can involve the brain, heart, liver, stomach, and intestines. Psychological problems related to alcohol use include intoxication, depression, anxiety, psychosis, delirium, and dementia.   The need for increased amounts of alcohol to achieve the same desired effect, or a decreased effect from the consumption of the same amount of alcohol (tolerance).  Withdrawal symptoms upon reducing or stopping alcohol use, or alcohol use to reduce or avoid withdrawal symptoms. Withdrawal symptoms include:  Racing heart.  Hand tremor.  Difficulty sleeping.  Nausea.  Vomiting.  Hallucinations.  Restlessness.  Seizures. DIAGNOSIS Alcohol use disorder is diagnosed through an assessment by your health care provider. Your health care provider may start by asking three or four questions to screen for excessive or problematic alcohol use. To confirm a diagnosis of alcohol use disorder, at least two symptoms must be present within a 2337-month period. The severity of alcohol use disorder depends on the number of symptoms:  Mild--two or three.  Moderate--four or five.  Severe--six or more. Your health care provider may perform  a physical exam or use results from lab tests to see if you have physical problems resulting from alcohol use. Your health care provider may refer you to a mental health professional for evaluation. TREATMENT  Some people with alcohol use disorder are able to reduce their alcohol use to low-risk levels. Some people with alcohol use disorder need to quit drinking alcohol. When necessary, mental health professionals with specialized training in substance use treatment can help. Your health care provider can  help you decide how severe your alcohol use disorder is and what type of treatment you need. The following forms of treatment are available:   Detoxification. Detoxification involves the use of prescription medicines to prevent alcohol withdrawal symptoms in the first week after quitting. This is important for people with a history of symptoms of withdrawal and for heavy drinkers who are likely to have withdrawal symptoms. Alcohol withdrawal can be dangerous and, in severe cases, cause death. Detoxification is usually provided in a hospital or in-patient substance use treatment facility.  Counseling or talk therapy. Talk therapy is provided by substance use treatment counselors. It addresses the reasons people use alcohol and ways to keep them from drinking again. The goals of talk therapy are to help people with alcohol use disorder find healthy activities and ways to cope with life stress, to identify and avoid triggers for alcohol use, and to handle cravings, which can cause relapse.  Medicines.Different medicines can help treat alcohol use disorder through the following actions:  Decrease alcohol cravings.  Decrease the positive reward response felt from alcohol use.  Produce an uncomfortable physical reaction when alcohol is used (aversion therapy).  Support groups. Support groups are run by people who have quit drinking. They provide emotional support, advice, and guidance. These forms of treatment are often combined. Some people with alcohol use disorder benefit from intensive combination treatment provided by specialized substance use treatment centers. Both inpatient and outpatient treatment programs are available. Document Released: 08/24/2004 Document Revised: 12/01/2013 Document Reviewed: 10/24/2012 [facility]Exit[facility] Patient Information 2015 [employer], [place]. This information is not intended to replace advice given to you by your health care provider. Make sure you discuss any questions you have  with your health care provider.  Clonidine tablets What is this medicine? CLONIDINE (KLOE ni deen) is used to treat high blood pressure. This medicine may be used for other purposes; ask your health care provider or pharmacist if you have questions. COMMON BRAND NAME(S): Catapres What should I tell my health care provider before I take this medicine? They need to know if you have any of these conditions: -kidney disease -an unusual or allergic reaction to clonidine, other medicines, foods, dyes, or preservatives -pregnant or trying to get pregnant -breast-feeding How should I use this medicine? Take this medicine by mouth with a glass of water. Follow the directions on the prescription label. Take your doses at regular intervals. Do not take your medicine more often than directed. Do not suddenly stop taking this medicine. You must gradually reduce the dose or you may get a dangerous increase in blood pressure. Ask your doctor or health care professional for advice. Talk to your pediatrician regarding the use of this medicine in children. Special care may be needed. Overdosage: If you think you have taken too much of this medicine contact a poison control center or emergency room at once. NOTE: This medicine is only for you. Do not share this medicine with others. What if I miss a dose? If you miss a  dose, take it as soon as you can. If it is almost time for your next dose, take only that dose. Do not take double or extra doses. What may interact with this medicine? Do not take this medicine with any of the following medications: -MAOIs like Carbex, Eldepryl, Marplan, Nardil, and Parnate This medicine may also interact with the following medications: -barbiturate medicines for inducing sleep or treating seizures like phenobarbital -certain medicines for blood pressure, heart disease, irregular heart beat -certain medicines for depression, anxiety, or psychotic disturbances -prescription  pain medicines This list may not describe all possible interactions. Give your health care provider a list of all the medicines, herbs, non-prescription drugs, or dietary supplements you use. Also tell them if you smoke, drink alcohol, or use illegal drugs. Some items may interact with your medicine. What should I watch for while using this medicine? Visit your doctor or health care professional for regular checks on your progress. Check your heart rate and blood pressure regularly while you are taking this medicine. Ask your doctor or health care professional what your heart rate should be and when you should contact him or her. You may get drowsy or dizzy. Do not drive, use machinery, or do anything that needs mental alertness until you know how this medicine affects you. To avoid dizzy or fainting spells, do not stand or sit up quickly, especially if you are an older person. Alcohol can make you more drowsy and dizzy. Avoid alcoholic drinks. Your mouth may get dry. Chewing sugarless gum or sucking hard candy, and drinking plenty of water will help. Do not treat yourself for coughs, colds, or pain while you are taking this medicine without asking your doctor or health care professional for advice. Some ingredients may increase your blood pressure. If you are going to have surgery tell your doctor or health care professional that you are taking this medicine. What side effects may I notice from receiving this medicine? Side effects that you should report to your doctor or health care professional as soon as possible: -allergic reactions like skin rash, itching or hives, swelling of the face, lips, or tongue -anxiety, nervousness -chest pain -depression -fast, irregular heartbeat -swelling of feet or legs -unusually weak or tired Side effects that usually do not require medical attention (report to your doctor or health care professional if they continue or are bothersome): -change in sex drive or  performance -constipation -headache This list may not describe all possible side effects. Call your doctor for medical advice about side effects. You may report side effects to FDA at 1-800-FDA-1088. Where should I keep my medicine? Keep out of the reach of children. Store at room temperature between 15 and 30 degrees C (59 and 86 degrees F). Protect from light. Keep container tightly closed. Throw away any unused medicine after the expiration date. NOTE: This sheet is a summary. It may not cover all possible information. If you have questions about this medicine, talk to your doctor, pharmacist, or health care provider.  2015, [employer] Standard. (2011-01-11 13:01:28)  Pantoprazole tablets What is this medicine? PANTOPRAZOLE (pan TOE pra zole) prevents the production of acid in the stomach. It is used to treat gastroesophageal reflux disease (GERD), inflammation of the esophagus, and Zollinger-Ellison syndrome. This medicine may be used for other purposes; ask your health care provider or pharmacist if you have questions. COMMON BRAND NAME(S): Protonix What should I tell my health care provider before I take this medicine? They need to know if you  have any of these conditions: -liver disease -low levels of magnesium in the blood -an unusual or allergic reaction to omeprazole, lansoprazole, pantoprazole, rabeprazole, other medicines, foods, dyes, or preservatives -pregnant or trying to get pregnant -breast-feeding How should I use this medicine? Take this medicine by mouth. Swallow the tablets whole with a drink of water. Follow the directions on the prescription label. Do not crush, break, or chew. Take your medicine at regular intervals. Do not take your medicine more often than directed. Talk to your pediatrician regarding the use of this medicine in children. While this drug may be prescribed for children as young as 5 years for selected conditions, precautions do apply. Overdosage:  If you think you have taken too much of this medicine contact a poison control center or emergency room at once. NOTE: This medicine is only for you. Do not share this medicine with others. What if I miss a dose? If you miss a dose, take it as soon as you can. If it is almost time for your next dose, take only that dose. Do not take double or extra doses. What may interact with this medicine? Do not take this medicine with any of the following medications: -atazanavir -nelfinavir This medicine may also interact with the following medications: -ampicillin -delavirdine -digoxin -diuretics -iron salts -medicines for fungal infections like ketoconazole, itraconazole and voriconazole -warfarin This list may not describe all possible interactions. Give your health care provider a list of all the medicines, herbs, non-prescription drugs, or dietary supplements you use. Also tell them if you smoke, drink alcohol, or use illegal drugs. Some items may interact with your medicine. What should I watch for while using this medicine? It can take several days before your stomach pain gets better. Check with your doctor or health care professional if your condition does not start to get better, or if it gets worse. You may need blood work done while you are taking this medicine. What side effects may I notice from receiving this medicine? Side effects that you should report to your doctor or health care professional as soon as possible: -allergic reactions like skin rash, itching or hives, swelling of the face, lips, or tongue -bone, muscle or joint pain -breathing problems -chest pain or chest tightness -dark yellow or brown urine -dizziness -fast, irregular heartbeat -feeling faint or lightheaded -fever or sore throat -muscle spasm -palpitations -redness, blistering, peeling or loosening of the skin, including inside the mouth -seizures -tremors -unusual bleeding or bruising -unusually weak or  tired -yellowing of the eyes or skin Side effects that usually do not require medical attention (Report these to your doctor or health care professional if they continue or are bothersome.): -constipation -diarrhea -dry mouth -headache -nausea This list may not describe all possible side effects. Call your doctor for medical advice about side effects. You may report side effects to FDA at 1-800-FDA-1088. Where should I keep my medicine? Keep out of the reach of children. Store at room temperature between 15 and 30 degrees C (59 and 86 degrees F). Protect from light and moisture. Throw away any unused medicine after the expiration date. NOTE: This sheet is a summary. It may not cover all possible information. If you have questions about this medicine, talk to your doctor, pharmacist, or health care provider.  2015, Elsevier/Gold Standard. (2012-05-15 16:40:16)  Naproxen and naproxen sodium oral immediate-release tablets What is this medicine? NAPROXEN (na PROX en) is a non-steroidal anti-inflammatory drug (NSAID). It is used to reduce swelling  and to treat pain. This medicine may be used for dental pain, headache, or painful monthly periods. It is also used for painful joint and muscular problems such as arthritis, tendinitis, bursitis, and gout. This medicine may be used for other purposes; ask your health care provider or pharmacist if you have questions. COMMON BRAND NAME(S): Aflaxen, Aleve, Aleve Arthritis, All Day Relief, Anaprox, Anaprox DS, Naprosyn What should I tell my health care provider before I take this medicine? They need to know if you have any of these conditions: -asthma -cigarette smoker -drink more than 3 alcohol containing drinks a day -heart disease or circulation problems such as heart failure or leg edema (fluid retention) -high blood pressure -kidney disease -liver disease -stomach bleeding or ulcers -an unusual or allergic reaction to naproxen, aspirin, other  NSAIDs, other medicines, foods, dyes, or preservatives -pregnant or trying to get pregnant -breast-feeding How should I use this medicine? Take this medicine by mouth with a glass of water. Follow the directions on the prescription label. Take it with food if your stomach gets upset. Try to not lie down for at least 10 minutes after you take it. Take your medicine at regular intervals. Do not take your medicine more often than directed. Long-term, continuous use may increase the risk of heart attack or stroke. A special MedGuide will be given to you by the pharmacist with each prescription and refill. Be sure to read this information carefully each time. Talk to your pediatrician regarding the use of this medicine in children. Special care may be needed. Overdosage: If you think you have taken too much of this medicine contact a poison control center or emergency room at once. NOTE: This medicine is only for you. Do not share this medicine with others. What if I miss a dose? If you miss a dose, take it as soon as you can. If it is almost time for your next dose, take only that dose. Do not take double or extra doses. What may interact with this medicine? -alcohol -aspirin -cidofovir -diuretics -lithium -methotrexate -other drugs for inflammation like ketorolac or prednisone -pemetrexed -probenecid -warfarin This list may not describe all possible interactions. Give your health care provider a list of all the medicines, herbs, non-prescription drugs, or dietary supplements you use. Also tell them if you smoke, drink alcohol, or use illegal drugs. Some items may interact with your medicine. What should I watch for while using this medicine? Tell your doctor or health care professional if your pain does not get better. Talk to your doctor before taking another medicine for pain. Do not treat yourself. This medicine does not prevent heart attack or stroke. In fact, this medicine may increase the  chance of a heart attack or stroke. The chance may increase with longer use of this medicine and in people who have heart disease. If you take aspirin to prevent heart attack or stroke, talk with your doctor or health care professional. Do not take other medicines that contain aspirin, ibuprofen, or naproxen with this medicine. Side effects such as stomach upset, nausea, or ulcers may be more likely to occur. Many medicines available without a prescription should not be taken with this medicine. This medicine can cause ulcers and bleeding in the stomach and intestines at any time during treatment. Do not smoke cigarettes or drink alcohol. These increase irritation to your stomach and can make it more susceptible to damage from this medicine. Ulcers and bleeding can happen without warning symptoms and can cause  death. You may get drowsy or dizzy. Do not drive, use machinery, or do anything that needs mental alertness until you know how this medicine affects you. Do not stand or sit up quickly, especially if you are an older patient. This reduces the risk of dizzy or fainting spells. This medicine can cause you to bleed more easily. Try to avoid damage to your teeth and gums when you brush or floss your teeth. What side effects may I notice from receiving this medicine? Side effects that you should report to your doctor or health care professional as soon as possible: -black or bloody stools, blood in the urine or vomit -blurred vision -chest pain -difficulty breathing or wheezing -nausea or vomiting -severe stomach pain -skin rash, skin redness, blistering or peeling skin, hives, or itching -slurred speech or weakness on one side of the body -swelling of eyelids, throat, lips -unexplained weight gain or swelling -unusually weak or tired -yellowing of eyes or skin Side effects that usually do not require medical attention (report to your doctor or health care professional if they continue or are  bothersome): -constipation -headache -heartburn This list may not describe all possible side effects. Call your doctor for medical advice about side effects. You may report side effects to FDA at 1-800-FDA-1088. Where should I keep my medicine? Keep out of the reach of children. Store at room temperature between 15 and 30 degrees C (59 and 86 degrees F). Keep container tightly closed. Throw away any unused medicine after the expiration date. NOTE: This sheet is a summary. It may not cover all possible information. If you have questions about this medicine, talk to your doctor, pharmacist, or health care provider.  2015, Elsevier/Gold Standard. (2009-07-19 20:10:16)

## 2014-09-19 ENCOUNTER — Inpatient Hospital Stay (HOSPITAL_COMMUNITY)
Admission: EM | Admit: 2014-09-19 | Discharge: 2014-09-21 | DRG: 896 | Disposition: A | Discharge status: Home / Self Care | Payer: Medicare Other | Attending: Family Medicine | Admitting: Family Medicine

## 2014-09-19 ENCOUNTER — Emergency Department (HOSPITAL_COMMUNITY): Payer: Medicare Other

## 2014-09-19 ENCOUNTER — Encounter (HOSPITAL_COMMUNITY): Payer: Self-pay

## 2014-09-19 DIAGNOSIS — F1027 Alcohol dependence with alcohol-induced persisting dementia: Secondary | ICD-10-CM | POA: Diagnosis present

## 2014-09-19 DIAGNOSIS — D696 Thrombocytopenia, unspecified: Secondary | ICD-10-CM | POA: Diagnosis present

## 2014-09-19 DIAGNOSIS — F10931 Alcohol use, unspecified with withdrawal delirium: Secondary | ICD-10-CM

## 2014-09-19 DIAGNOSIS — E872 Acidosis: Secondary | ICD-10-CM | POA: Diagnosis present

## 2014-09-19 DIAGNOSIS — F1310 Sedative, hypnotic or anxiolytic abuse, uncomplicated: Secondary | ICD-10-CM | POA: Diagnosis present

## 2014-09-19 DIAGNOSIS — F10231 Alcohol dependence with withdrawal delirium: Principal | ICD-10-CM | POA: Diagnosis present

## 2014-09-19 DIAGNOSIS — K7030 Alcoholic cirrhosis of liver without ascites: Secondary | ICD-10-CM | POA: Diagnosis present

## 2014-09-19 DIAGNOSIS — G8929 Other chronic pain: Secondary | ICD-10-CM | POA: Diagnosis present

## 2014-09-19 DIAGNOSIS — G9340 Encephalopathy, unspecified: Secondary | ICD-10-CM

## 2014-09-19 DIAGNOSIS — F919 Conduct disorder, unspecified: Secondary | ICD-10-CM | POA: Diagnosis present

## 2014-09-19 DIAGNOSIS — K7460 Unspecified cirrhosis of liver: Secondary | ICD-10-CM | POA: Diagnosis present

## 2014-09-19 DIAGNOSIS — G92 Toxic encephalopathy: Secondary | ICD-10-CM | POA: Diagnosis present

## 2014-09-19 DIAGNOSIS — K7010 Alcoholic hepatitis without ascites: Secondary | ICD-10-CM | POA: Diagnosis present

## 2014-09-19 DIAGNOSIS — J449 Chronic obstructive pulmonary disease, unspecified: Secondary | ICD-10-CM | POA: Diagnosis present

## 2014-09-19 DIAGNOSIS — Z8249 Family history of ischemic heart disease and other diseases of the circulatory system: Secondary | ICD-10-CM | POA: Diagnosis not present

## 2014-09-19 DIAGNOSIS — D6959 Other secondary thrombocytopenia: Secondary | ICD-10-CM | POA: Diagnosis present

## 2014-09-19 DIAGNOSIS — I10 Essential (primary) hypertension: Secondary | ICD-10-CM | POA: Diagnosis present

## 2014-09-19 DIAGNOSIS — F17210 Nicotine dependence, cigarettes, uncomplicated: Secondary | ICD-10-CM | POA: Diagnosis present

## 2014-09-19 DIAGNOSIS — F1110 Opioid abuse, uncomplicated: Secondary | ICD-10-CM | POA: Diagnosis present

## 2014-09-19 DIAGNOSIS — F1010 Alcohol abuse, uncomplicated: Secondary | ICD-10-CM | POA: Diagnosis present

## 2014-09-19 DIAGNOSIS — R4182 Altered mental status, unspecified: Secondary | ICD-10-CM

## 2014-09-19 DIAGNOSIS — K760 Fatty (change of) liver, not elsewhere classified: Secondary | ICD-10-CM | POA: Diagnosis present

## 2014-09-19 DIAGNOSIS — E8729 Other acidosis: Secondary | ICD-10-CM

## 2014-09-19 LAB — COMPREHENSIVE METABOLIC PANEL
ALBUMIN: 3.6 g/dL (ref 3.5–5.2)
ALT: 34 U/L (ref 0–53)
AST: 79 U/L — ABNORMAL HIGH (ref 0–37)
Alkaline Phosphatase: 82 U/L (ref 39–117)
Anion gap: 8 (ref 5–15)
BILIRUBIN TOTAL: 3.3 mg/dL — AB (ref 0.3–1.2)
BUN: 18 mg/dL (ref 6–23)
CALCIUM: 9 mg/dL (ref 8.4–10.5)
CO2: 25 mmol/L (ref 19–32)
Chloride: 102 mmol/L (ref 96–112)
Creatinine, Ser: 0.95 mg/dL (ref 0.50–1.35)
GFR calc non Af Amer: 85 mL/min — ABNORMAL LOW (ref >90)
GLUCOSE: 113 mg/dL — AB (ref 70–99)
Potassium: 5 mmol/L (ref 3.5–5.1)
SODIUM: 135 mmol/L (ref 135–145)
TOTAL PROTEIN: 7.3 g/dL (ref 6.0–8.3)

## 2014-09-19 LAB — CBC WITH DIFFERENTIAL/PLATELET
Basophils Absolute: 0.1 10*3/uL (ref 0.0–0.1)
Basophils Relative: 1 % (ref 0–1)
EOS ABS: 0 10*3/uL (ref 0.0–0.7)
Eosinophils Relative: 0 % (ref 0–5)
HCT: 40.6 % (ref 39.0–52.0)
HEMOGLOBIN: 14.3 g/dL (ref 13.0–17.0)
Lymphocytes Relative: 12 % (ref 12–46)
Lymphs Abs: 1.1 10*3/uL (ref 0.7–4.0)
MCH: 35.8 pg — ABNORMAL HIGH (ref 26.0–34.0)
MCHC: 35.2 g/dL (ref 30.0–36.0)
MCV: 101.5 fL — AB (ref 78.0–100.0)
MONO ABS: 0.6 10*3/uL (ref 0.1–1.0)
Monocytes Relative: 6 % (ref 3–12)
NEUTROS PCT: 81 % — AB (ref 43–77)
Neutro Abs: 7.5 10*3/uL (ref 1.7–7.7)
PLATELETS: 88 10*3/uL — AB (ref 150–400)
RBC: 4 MIL/uL — ABNORMAL LOW (ref 4.22–5.81)
RDW: 15 % (ref 11.5–15.5)
WBC: 9.3 10*3/uL (ref 4.0–10.5)

## 2014-09-19 LAB — RAPID URINE DRUG SCREEN, HOSP PERFORMED
Amphetamines: NOT DETECTED
BENZODIAZEPINES: POSITIVE — AB
Barbiturates: NOT DETECTED
COCAINE: NOT DETECTED
OPIATES: POSITIVE — AB
TETRAHYDROCANNABINOL: NOT DETECTED

## 2014-09-19 LAB — URINE MICROSCOPIC-ADD ON

## 2014-09-19 LAB — URINALYSIS, ROUTINE W REFLEX MICROSCOPIC
GLUCOSE, UA: NEGATIVE mg/dL
Ketones, ur: 15 mg/dL — AB
LEUKOCYTES UA: NEGATIVE
Nitrite: NEGATIVE
Protein, ur: 30 mg/dL — AB
Specific Gravity, Urine: 1.02 (ref 1.005–1.030)
Urobilinogen, UA: 0.2 mg/dL (ref 0.0–1.0)
pH: 6.5 (ref 5.0–8.0)

## 2014-09-19 LAB — ETHANOL: Alcohol, Ethyl (B): <5 mg/dL (ref 0–9)

## 2014-09-19 LAB — AMMONIA: Ammonia: 22 umol/L (ref 11–32)

## 2014-09-19 LAB — I-STAT CG4 LACTIC ACID, ED: Lactic Acid, Venous: 2.85 mmol/L (ref 0.5–2.0)

## 2014-09-19 LAB — LIPASE, BLOOD: Lipase: 23 U/L (ref 11–59)

## 2014-09-19 LAB — TROPONIN I

## 2014-09-19 MED ORDER — THIAMINE HCL 100 MG/ML IJ SOLN
100.0000 mg | Freq: Once | INTRAMUSCULAR | Status: AC
  Administered 2014-09-19: 100 mg via INTRAVENOUS
  Filled 2014-09-19: qty 2

## 2014-09-19 MED ORDER — DIAZEPAM 5 MG/ML IJ SOLN
5.0000 mg | Freq: Three times a day (TID) | INTRAMUSCULAR | Status: DC | PRN
  Administered 2014-09-19: 5 mg via INTRAVENOUS
  Filled 2014-09-19: qty 2

## 2014-09-19 MED ORDER — SODIUM CHLORIDE 0.9 % IV SOLN
INTRAVENOUS | Status: DC
  Administered 2014-09-19 – 2014-09-20 (×2): via INTRAVENOUS

## 2014-09-19 MED ORDER — LORAZEPAM 2 MG/ML IJ SOLN
0.0000 mg | Freq: Two times a day (BID) | INTRAMUSCULAR | Status: DC
  Filled 2014-09-19: qty 1

## 2014-09-19 MED ORDER — ENOXAPARIN SODIUM 40 MG/0.4ML ~~LOC~~ SOLN
40.0000 mg | SUBCUTANEOUS | Status: DC
  Administered 2014-09-19: 40 mg via SUBCUTANEOUS
  Filled 2014-09-19: qty 0.4

## 2014-09-19 MED ORDER — ONDANSETRON HCL 4 MG/2ML IJ SOLN
4.0000 mg | Freq: Four times a day (QID) | INTRAMUSCULAR | Status: DC | PRN
  Administered 2014-09-20 – 2014-09-21 (×3): 4 mg via INTRAVENOUS
  Filled 2014-09-19 (×3): qty 2

## 2014-09-19 MED ORDER — SODIUM CHLORIDE 0.9 % IV SOLN: 250.0000 mL | INTRAVENOUS | Status: DC | PRN

## 2014-09-19 MED ORDER — LORAZEPAM 2 MG/ML IJ SOLN
0.0000 mg | Freq: Four times a day (QID) | INTRAMUSCULAR | Status: DC
  Administered 2014-09-19: 2 mg via INTRAVENOUS

## 2014-09-19 MED ORDER — LORAZEPAM 1 MG PO TABS: 0.0000 mg | ORAL_TABLET | Freq: Four times a day (QID) | ORAL | Status: DC

## 2014-09-19 MED ORDER — HYDROCODONE-ACETAMINOPHEN 10-325 MG PO TABS
1.0000 | ORAL_TABLET | ORAL | Status: DC | PRN
  Administered 2014-09-21: 1 via ORAL
  Filled 2014-09-19: qty 1

## 2014-09-19 MED ORDER — LORAZEPAM 1 MG PO TABS: 0.0000 mg | ORAL_TABLET | Freq: Two times a day (BID) | ORAL | Status: DC

## 2014-09-19 MED ORDER — ASPIRIN 300 MG RE SUPP
300.0000 mg | RECTAL | Status: AC
  Administered 2014-09-19: 300 mg via RECTAL
  Filled 2014-09-19: qty 1

## 2014-09-19 MED ORDER — LORAZEPAM 2 MG/ML IJ SOLN
2.0000 mg | INTRAMUSCULAR | Status: DC | PRN
Start: 2014-09-19 — End: 2014-09-21
  Administered 2014-09-20: 2 mg via INTRAVENOUS
  Filled 2014-09-19: qty 1

## 2014-09-19 MED ORDER — ASPIRIN 81 MG PO CHEW: 324.0000 mg | CHEWABLE_TABLET | ORAL | Status: AC

## 2014-09-19 MED ORDER — PANTOPRAZOLE SODIUM 40 MG IV SOLR
40.0000 mg | Freq: Every day | INTRAVENOUS | Status: DC
  Administered 2014-09-19: 40 mg via INTRAVENOUS
  Filled 2014-09-19: qty 40

## 2014-09-19 MED ORDER — CLONIDINE HCL 0.2 MG PO TABS
0.2000 mg | ORAL_TABLET | Freq: Two times a day (BID) | ORAL | Status: DC
Start: 2014-09-19 — End: 2014-09-21
  Administered 2014-09-19 – 2014-09-21 (×4): 0.2 mg via ORAL
  Filled 2014-09-19 (×4): qty 1

## 2014-09-19 NOTE — ED Notes (Signed)
Pt cleaned and repositioned in bed.

## 2014-09-19 NOTE — ED Notes (Signed)
Pt given diet coke. Pt refused meal tray.

## 2014-09-19 NOTE — ED Notes (Signed)
Pt very anxious, moving around in bed, and messing with leads and cords.

## 2014-09-19 NOTE — H&P (Signed)
History and Physical  Roberto [name] JYN:[id number] DOB: June 08, 1946 DOA: 09/19/2014  Referring physician: Dr Blinda LeatherwoodPollina, ED physician PCP: Kirk RuthsMCGOUGH,WILLIAM M, MD   Chief Complaint: Alcohol withdrawal  HPI: Roberto [name] is a 69 y.o. male  With a history of COPD, hypertension, hepatic steatosis, chronic pain, history of substance abuse. Patient was brought to the emergency department due to confusion and shaking. The patient is prescribed hydrocodone and Xanax - he receives 120 tablets of Xanax 1 mg every month, which lasts him approximately 15 days according to his brother. [facility]  The police responded to a well check due to his yelling. He was found to be hallucinating and confused and covered with a soft stool. Upon arrival, the patient was unable to answer questions appropriately. His brother reports the patient's abuse history. The patient drinks 2-3 16 ounce cans of beer when he has his medication. When the patient runs out of his medication he drinks 4-5 cans of beer a day. The patient complains of shaking, visual hallucinations, tactile sensations. The patient is also quite confused and is unable to provide an adequate history, although is able to answer some questions regarding review of systems.   Review of Systems:   Pt complains of  Abdominal pain, nausea, one episode of vomiting last night, diarrhea   Pt denies any fevers, chills, chest pain, shortness of breath, difficulty breathing, constipation, melena, hematemesis, headache.  Review of systems are otherwise negative  Past Medical History  Diagnosis Date  . COPD (chronic obstructive pulmonary disease)   . Hypertension   . Hepatic steatosis 12/14/2011  . Compression fracture of L1 lumbar vertebra 12/14/2011  . C2 cervical fracture   . Duodenal ulcer with hemorrhage 12/15/2011    Likely source of bleeding.per EGD; Dr. Karilyn Cotaehman  . Esophageal varices without bleeding 12/15/2011    per EGD  . Cirrhosis  12/16/2011    per ultrasound  . Hx of substance abuse 12/17/2011  . 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 Hepatic encephalopathy   . Helicobacter pylori antibody positive 12/19/2011  . Encephalopathy 05/18/2012  . UTI (urinary tract infection) 12/13/2011  . Ascites 05/18/2012    S/p paracentesis yielding 7880 mL   Past Surgical History  Procedure Laterality Date  . Coronary angioplasty with stent placement     Social History:  reports that he has been smoking Cigarettes.  He has been smoking about 1.00 pack per day. He has never used smokeless tobacco. He reports that he drinks alcohol. He reports that he uses illicit drugs (Cocaine). [facility]  & is able to participate in activities of daily living  No Known Allergies  No family history on file.  Family history of hypertension   Prior to Admission medications   Medication Sig Start Date End Date Taking? Authorizing Provider  ALPRAZolam Prudy Feeler(XANAX) 1 MG tablet Take 1 mg by mouth 4 (four) times daily as needed for anxiety or sleep. Anxiety.   Yes Historical Provider, MD  HYDROcodone-acetaminophen (NORCO) 10-325 MG per tablet Take 1 tablet by mouth every 4 (four) hours as needed for moderate pain.    Yes Historical Provider, MD  omeprazole (PRILOSEC) 20 MG capsule Take 20 mg by mouth daily as needed (for acid reflux).   Yes Historical Provider, MD  cloNIDine (CATAPRES) 0.2 MG tablet Take 1 tablet (0.2 mg total) by mouth 2 (two) times daily. Patient not taking: Reported on 09/19/2014 07/23/14   Dione Boozeavid Glick, MD  Fluticasone-Salmeterol (ADVAIR) 250-50 MCG/DOSE AEPB Inhale 1 puff into the lungs every 12 (twelve) hours.    Historical Provider, MD  lactulose (CHRONULAC) 10 GM/15ML solution Take 10 g by mouth 2 (two) times daily as needed (Constipation). 01/13/13   Malissa Hippo, MD  naproxen (NAPROSYN) 500 MG tablet Take 1 tablet (500 mg total) by mouth 2 (two) times daily. Patient not taking:  Reported on 09/19/2014 07/23/14   Dione Booze, MD  NITROSTAT 0.4 MG SL tablet Place under the tongue every 5 (five) minutes as needed for chest pain.  06/28/13   Historical Provider, MD  ondansetron (ZOFRAN ODT) 8 MG disintegrating tablet Take 1 tablet (8 mg total) by mouth every 8 (eight) hours as needed.  ODT q4 hours prn nausea Patient not taking: Reported on 09/19/2014 07/13/14   Joya Gaskins, MD  pantoprazole (PROTONIX) 40 MG tablet Take 1 tablet (40 mg total) by mouth daily. Patient not taking: Reported on 09/19/2014 07/23/14   Dione Booze, MD    Physical Exam: BP 170/94 mmHg  Pulse 89  Temp(Src) 98.1 F (36.7 C) (Oral)  Resp 18  Ht  (1.854 m)  Wt 81.647 kg (180 lb)  BMI 23.75 kg/m2  SpO2 96%  General:  elderly Caucasian male. Awake and alert, but fairly disoriented - oriented to person and reports place as [place]. No acute cardiopulmonary distress.  Eyes: Pupils equal, round, reactive to light. Extraocular muscles are intact. Sclerae anicteric and noninjected.  ENT: Moist mucosal membranes. No mucosal lesions. Teeth poor repair  Neck: Neck supple without lymphadenopathy. No carotid bruits. No masses palpated.  Cardiovascular: Regular rate with normal S1-S2 sounds. No murmurs, rubs, gallops auscultated. No JVD.  Respiratory: Good respiratory effort with no wheezes, rales, rhonchi. Lungs clear to auscultation bilaterally.  Abdomen: Soft, nontender, nondistended. Active bowel sounds. No masses or hepatosplenomegaly  Skin: Dry, warm to touch. 2+ dorsalis pedis and radial pulses. Musculoskeletal: No calf or leg pain. All major joints not erythematous nontender.  Psychiatric: Intact judgment and insight.  Neurologic: No focal neurological deficits. Cranial nerves II through XII are grossly intact.           Labs on Admission:  Basic Metabolic Panel:  Recent Labs Lab 09/19/14 1744  NA 135  K 5.0  CL 102  CO2 25  GLUCOSE 113*  BUN 18  CREATININE 0.95    CALCIUM 9.0   Liver Function Tests:  Recent Labs Lab 09/19/14 1744  AST 79*  ALT 34  ALKPHOS 82  BILITOT 3.3*  PROT 7.3  ALBUMIN 3.6    Recent Labs Lab 09/19/14 1744  LIPASE 23    Recent Labs Lab 09/19/14 1744  AMMONIA 22   CBC:  Recent Labs Lab 09/19/14 1744  WBC 9.3  NEUTROABS 7.5  HGB 14.3  HCT 40.6  MCV 101.5*  PLT 88*   Cardiac Enzymes:  Recent Labs Lab 09/19/14 1744  TROPONINI <0.03    BNP (last 3 results) No results for input(s): BNP in the last 8760 hours.  ProBNP (last 3 results) No results for input(s): PROBNP in the last 8760 hours.  CBG: No results for input(s): GLUCAP in the last 168 hours.  Radiological Exams on Admission: Ct Head Wo Contrast  09/19/2014   CLINICAL DATA:  Mental status change. Elevated ammonia. Hallucinations.  EXAM: CT HEAD WITHOUT CONTRAST  TECHNIQUE: Contiguous axial images were obtained from the base of the skull through the vertex without intravenous contrast.  COMPARISON:  05/18/2012  FINDINGS: Ventricles  are normal in configuration. There is ventricular and sulcal enlargement reflecting mild volume loss. No hydrocephalus.  There are no parenchymal masses or mass effect. There is no evidence of a cortical infarct. Minor periventricular white matter hypoattenuation is noted consistent with chronic microvascular ischemic change.  There are no extra-axial masses or abnormal fluid collections.  There is no intracranial hemorrhage.  Visualized sinuses and mastoid air cells are clear.  IMPRESSION: 1. No acute intracranial abnormalities. 2. Mild volume loss and minor chronic microvascular ischemic change, stable from the prior CT.   Electronically Signed   By: Amie Portland M.D.   On: 09/19/2014 18:52    Assessment/Plan Present on Admission:  . Delirium tremens . HTN (hypertension)   #1 delirium tremens #2 Hypertension #3 substance abuse #4 chronic alcoholism  Admit to stepdown   continue to treat withdrawal  symptoms with Ativan Protonix for GI protection Patient given thiamine in the urgency department Catapres for hypertension and anxiety symptoms  DVT prophylaxis:Lovenox   Consultants: None   Code Status: full code   Family Communication: brother in the room    Disposition Plan:  pending  Time spent:50 minutes  was spent with face-to-face time with patient with at least 50% with counseling and coordination of care  Levie Heritage, DO [facility] Pager [phone]

## 2014-09-19 NOTE — ED Notes (Addendum)
Pt very confused when I went in to introduce myself. Man in room states pt has had 9-16oz beers. Last one was Friday morning.

## 2014-09-19 NOTE — ED Provider Notes (Signed)
CSN: [id number]     Arrival date & time 09/19/14  1702 History   First MD Initiated Contact with Patient 09/19/14 1712     Chief Complaint  Patient presents with  . Abdominal Pain     (Consider location/radiation/quality/duration/timing/severity/associated sxs/prior Treatment) HPI Comments: Patient brought to the ER for evaluation of mental status changes. Patient does have a history of abuse of hydrocodone, Xanax and alcohol. Patient lives alone in a local hotel. He was heard yelling today and the police responded for a well check. Patient was found to be disheveled and confused. He was covered in his own stool. Patient was hallucinating and confused. Upon arrival he cannot answer questions appropriately. He is accompanied by his brother who informs us of the patient's abuse history. Mother reports that he uses hydrocodone and Xanax primarily. When he runs out of these medications, then he starts drinking heavily to cover his withdrawal symptoms. He has been out of his medications for several days. There were at least 9 empty 16 ounce beer cans present in the room when he was found.  Patient is a 69 y.o. male presenting with abdominal pain.  Abdominal Pain   Past Medical History  Diagnosis Date  . COPD (chronic obstructive pulmonary disease)   . Hypertension   . Hepatic steatosis 12/14/2011  . Compression fracture of L1 lumbar vertebra 12/14/2011  . C2 cervical fracture   . Duodenal ulcer with hemorrhage 12/15/2011    Likely source of bleeding.per EGD; Dr. Karilyn Cotaehman  . Esophageal varices without bleeding 12/15/2011    per EGD  . Cirrhosis 12/16/2011    per ultrasound  . Hx of substance abuse 12/17/2011  . 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 Hepatic encephalopathy   . Helicobacter pylori antibody positive 12/19/2011  . Encephalopathy 05/18/2012  . UTI (urinary tract infection) 12/13/2011  . Ascites 05/18/2012    S/p paracentesis yielding 7880 mL   Past  Surgical History  Procedure Laterality Date  . Coronary angioplasty with stent placement     No family history on file. History  Substance Use Topics  . Smoking status: Current Every Day Smoker -- 1.00 packs/day    Types: Cigarettes  . Smokeless tobacco: Never Used     Comment: 1 1/2 pack a day  . Alcohol Use: Yes     Comment: Patient states that he drinks 15 beers a week    Review of Systems  Unable to perform ROS: Mental status change  Gastrointestinal: Positive for abdominal pain.      Allergies  Review of patient's allergies indicates no known allergies.  Home Medications   Prior to Admission medications   Medication Sig Start Date End Date Taking? Authorizing Provider  ALPRAZolam Prudy Feeler(XANAX) 1 MG tablet Take 0.5-1 mg by mouth at bedtime. Anxiety.    Historical Provider, MD  cloNIDine (CATAPRES) 0.2 MG tablet Take 1 tablet (0.2 mg total) by mouth 2 (two) times daily. 07/23/14   Dione Boozeavid Glick, MD  Fluticasone-Salmeterol (ADVAIR) 250-50 MCG/DOSE AEPB Inhale 1 puff into the lungs every 12 (twelve) hours.    Historical Provider, MD  HYDROcodone-acetaminophen (NORCO) 10-325 MG per tablet Take 1 tablet by mouth every 6 (six) hours as needed for pain.    Historical Provider, MD  lactulose (CHRONULAC) 10 GM/15ML solution Take 10 g by mouth 2 (two) times daily as needed (Constipation). 01/13/13   Malissa HippoNajeeb U Rehman, MD  Multiple Vitamin (MULTIVITAMIN WITH MINERALS) TABS Take 1 tablet by mouth.     Historical  Provider, MD  naproxen (NAPROSYN) 500 MG tablet Take 1 tablet (500 mg total) by mouth 2 (two) times daily. 07/23/14   Dione Booze, MD  NITROSTAT 0.4 MG SL tablet Place under the tongue every 5 (five) minutes as needed for chest pain.  06/28/13   Historical Provider, MD  ondansetron (ZOFRAN ODT) 8 MG disintegrating tablet Take 1 tablet (8 mg total) by mouth every 8 (eight) hours as needed.  ODT q4 hours prn nausea 07/13/14   Joya Gaskins, MD  pantoprazole (PROTONIX) 40 MG tablet  Take 1 tablet (40 mg total) by mouth daily. 07/23/14   Dione Booze, MD   BP 163/101 mmHg  Pulse 91  Temp(Src) 98.1 F (36.7 C) (Oral)  Resp 21  Ht  (1.854 m)  Wt 180 lb (81.647 kg)  BMI 23.75 kg/m2  SpO2 93% Physical Exam  Constitutional: He appears well-developed and well-nourished. No distress.  HENT:  Head: Normocephalic and atraumatic.  Right Ear: Hearing normal.  Left Ear: Hearing normal.  Nose: Nose normal.  Mouth/Throat: Oropharynx is clear and moist and mucous membranes are normal.  Eyes: Conjunctivae and EOM are normal. Pupils are equal, round, and reactive to light.  Neck: Normal range of motion. Neck supple.  Cardiovascular: Regular rhythm, S1 normal and S2 normal.  Exam reveals no gallop and no friction rub.   No murmur heard. Pulmonary/Chest: Effort normal and breath sounds normal. No respiratory distress. He exhibits no tenderness.  Abdominal: Soft. Normal appearance and bowel sounds are normal. There is no hepatosplenomegaly. There is no tenderness. There is no rebound, no guarding, no tenderness at McBurney's point and negative Murphy's sign. No hernia.  Musculoskeletal: Normal range of motion.  Neurological: He is alert. He has normal strength. He is disoriented. He displays tremor. No cranial nerve deficit or sensory deficit. Coordination normal. GCS eye subscore is 4. GCS verbal subscore is 4. GCS motor subscore is 6.  Skin: Skin is warm, dry and intact. No rash noted. No cyanosis.  Psychiatric: He has a normal mood and affect. His speech is normal and behavior is normal. Thought content normal.  Nursing note and vitals reviewed.   ED Course  Procedures (including critical care time) Labs Review Labs Reviewed  CBC WITH DIFFERENTIAL/PLATELET - Abnormal; Notable for the following:    RBC 4.00 (*)    MCV 101.5 (*)    MCH 35.8 (*)    Platelets 88 (*)    Neutrophils Relative % 81 (*)    All other components within normal limits  COMPREHENSIVE METABOLIC  PANEL - Abnormal; Notable for the following:    Glucose, Bld 113 (*)    AST 79 (*)    Total Bilirubin 3.3 (*)    GFR calc non Af Amer 85 (*)    All other components within normal limits  URINALYSIS, ROUTINE W REFLEX MICROSCOPIC - Abnormal; Notable for the following:    Color, Urine AMBER (*)    Hgb urine dipstick TRACE (*)    Bilirubin Urine SMALL (*)    Ketones, ur 15 (*)    Protein, ur 30 (*)    All other components within normal limits  URINE RAPID DRUG SCREEN (HOSP PERFORMED) - Abnormal; Notable for the following:    Opiates POSITIVE (*)    Benzodiazepines POSITIVE (*)    All other components within normal limits  I-STAT CG4 LACTIC ACID, ED - Abnormal; Notable for the following:    Lactic Acid, Venous 2.85 (*)    All other  components within normal limits  LIPASE, BLOOD  ETHANOL  TROPONIN I  AMMONIA  URINE MICROSCOPIC-ADD ON    Imaging Review Ct Head Wo Contrast  09/19/2014   CLINICAL DATA:  Mental status change. Elevated ammonia. Hallucinations.  EXAM: CT HEAD WITHOUT CONTRAST  TECHNIQUE: Contiguous axial images were obtained from the base of the skull through the vertex without intravenous contrast.  COMPARISON:  05/18/2012  FINDINGS: Ventricles are normal in configuration. There is ventricular and sulcal enlargement reflecting mild volume loss. No hydrocephalus.  There are no parenchymal masses or mass effect. There is no evidence of a cortical infarct. Minor periventricular white matter hypoattenuation is noted consistent with chronic microvascular ischemic change.  There are no extra-axial masses or abnormal fluid collections.  There is no intracranial hemorrhage.  Visualized sinuses and mastoid air cells are clear.  IMPRESSION: 1. No acute intracranial abnormalities. 2. Mild volume loss and minor chronic microvascular ischemic change, stable from the prior CT.   Electronically Signed   By: Amie Portland M.D.   On: 09/19/2014 18:52     EKG Interpretation None      MDM    Final diagnoses:  Mental status change  DTs (delirium tremens)    Patient presents to the ER for evaluation of acute mental status changes. Patient has been agitated, confused. He has been experiencing hallucinations. Patient is very shaky and tremulous upon arrival to the ER. He has a history of polysubstance abuse including alcohol. He was not clear if he had been binging and was acutely intoxicated and experiencing hepatic encephalopathy, or if he was withdrawing. Patient's blood work, however, shows no elevation of ammonia. He also has a negative alcohol level, indicating that he is likely withdrawing. Patient experiencing delirium tremens, will require hospitalization for further management.    Gilda Crease, MD 09/19/14 [phone]

## 2014-09-19 NOTE — ED Notes (Signed)
Pt has random bruising on body.

## 2014-09-19 NOTE — ED Notes (Signed)
Pt gets a prescription of 120-1mg  xanax every month. Pt's brother states that pt runs out of those half way through the month. Pt also takes (according to brother) 5-10-325mg  hydrocodones a day.

## 2014-09-19 NOTE — ED Notes (Signed)
EMS reports pt has history of etoh abuse and prescription drug abuse.  Reports last etoh was 2 nights ago and last time took prescription drugs was 5-6 days ago.  Today pt was heard yelling.  [employer] came to check on pt and called EMS.  Pt has history of cirrhosis of the liver.  Pt c/o abd pain.  Has history of elevated ammonia.  Pt presently alert and oriented, very shakey.  Pt c/o tingling in hands and toes and abd pain.  Pt also was incontinent of stool prior to arrival.  CBG per ems was 108.  EMS reports pt has been hallucinating today as well.

## 2014-09-20 ENCOUNTER — Encounter (HOSPITAL_COMMUNITY): Payer: Self-pay | Admitting: *Deleted

## 2014-09-20 DIAGNOSIS — G9340 Encephalopathy, unspecified: Secondary | ICD-10-CM

## 2014-09-20 DIAGNOSIS — E8729 Other acidosis: Secondary | ICD-10-CM

## 2014-09-20 DIAGNOSIS — K7030 Alcoholic cirrhosis of liver without ascites: Secondary | ICD-10-CM

## 2014-09-20 DIAGNOSIS — F1010 Alcohol abuse, uncomplicated: Secondary | ICD-10-CM

## 2014-09-20 DIAGNOSIS — D696 Thrombocytopenia, unspecified: Secondary | ICD-10-CM

## 2014-09-20 DIAGNOSIS — E872 Acidosis: Secondary | ICD-10-CM

## 2014-09-20 DIAGNOSIS — K7010 Alcoholic hepatitis without ascites: Secondary | ICD-10-CM

## 2014-09-20 LAB — CBC
HEMATOCRIT: 36.6 % — AB (ref 39.0–52.0)
Hemoglobin: 12.5 g/dL — ABNORMAL LOW (ref 13.0–17.0)
MCH: 34.6 pg — ABNORMAL HIGH (ref 26.0–34.0)
MCHC: 34.2 g/dL (ref 30.0–36.0)
MCV: 101.4 fL — ABNORMAL HIGH (ref 78.0–100.0)
PLATELETS: 76 10*3/uL — AB (ref 150–400)
RBC: 3.61 MIL/uL — ABNORMAL LOW (ref 4.22–5.81)
RDW: 14.4 % (ref 11.5–15.5)
WBC: 7.4 10*3/uL (ref 4.0–10.5)

## 2014-09-20 LAB — BASIC METABOLIC PANEL
Anion gap: 7 (ref 5–15)
BUN: 25 mg/dL — ABNORMAL HIGH (ref 6–23)
CHLORIDE: 107 mmol/L (ref 96–112)
CO2: 23 mmol/L (ref 19–32)
Calcium: 8.4 mg/dL (ref 8.4–10.5)
Creatinine, Ser: 0.89 mg/dL (ref 0.50–1.35)
GFR calc Af Amer: >90 mL/min (ref >90)
GFR calc non Af Amer: 87 mL/min — ABNORMAL LOW (ref >90)
Glucose, Bld: 106 mg/dL — ABNORMAL HIGH (ref 70–99)
POTASSIUM: 4.1 mmol/L (ref 3.5–5.1)
Sodium: 137 mmol/L (ref 135–145)

## 2014-09-20 LAB — MAGNESIUM: Magnesium: 1.8 mg/dL (ref 1.5–2.5)

## 2014-09-20 LAB — CLOSTRIDIUM DIFFICILE BY PCR: CDIFFPCR: NEGATIVE

## 2014-09-20 LAB — MRSA PCR SCREENING

## 2014-09-20 LAB — PHOSPHORUS: Phosphorus: 3.7 mg/dL (ref 2.3–4.6)

## 2014-09-20 MED ORDER — PANTOPRAZOLE SODIUM 40 MG PO TBEC
40.0000 mg | DELAYED_RELEASE_TABLET | Freq: Every day | ORAL | Status: DC
  Administered 2014-09-20: 40 mg via ORAL
  Filled 2014-09-20: qty 1

## 2014-09-20 MED ORDER — ADULT MULTIVITAMIN W/MINERALS CH
1.0000 | ORAL_TABLET | Freq: Every day | ORAL | Status: DC
  Administered 2014-09-20 – 2014-09-21 (×2): 1 via ORAL
  Filled 2014-09-20 (×2): qty 1

## 2014-09-20 MED ORDER — MUPIROCIN 2 % EX OINT
1.0000 "application " | TOPICAL_OINTMENT | Freq: Two times a day (BID) | CUTANEOUS | Status: DC
  Administered 2014-09-20 – 2014-09-21 (×3): 1 via NASAL
  Filled 2014-09-20: qty 22

## 2014-09-20 MED ORDER — DIAZEPAM 5 MG/ML IJ SOLN
5.0000 mg | Freq: Once | INTRAMUSCULAR | Status: AC
  Administered 2014-09-20: 5 mg via INTRAVENOUS
  Filled 2014-09-20: qty 2

## 2014-09-20 MED ORDER — FOLIC ACID 1 MG PO TABS
1.0000 mg | ORAL_TABLET | Freq: Every day | ORAL | Status: DC
  Administered 2014-09-20 – 2014-09-21 (×2): 1 mg via ORAL
  Filled 2014-09-20 (×2): qty 1

## 2014-09-20 MED ORDER — CHLORHEXIDINE GLUCONATE CLOTH 2 % EX PADS
6.0000 | MEDICATED_PAD | Freq: Every day | CUTANEOUS | Status: DC
  Administered 2014-09-21: 6 via TOPICAL

## 2014-09-20 MED ORDER — VITAMIN B-1 100 MG PO TABS
100.0000 mg | ORAL_TABLET | Freq: Every day | ORAL | Status: DC
  Administered 2014-09-20 – 2014-09-21 (×2): 100 mg via ORAL
  Filled 2014-09-20 (×2): qty 1

## 2014-09-20 MED ORDER — MUPIROCIN 2 % EX OINT: 1.0000 "application " | TOPICAL_OINTMENT | Freq: Two times a day (BID) | CUTANEOUS | Status: DC

## 2014-09-20 NOTE — Progress Notes (Signed)
Key Points: Use following P&T approved IV to PO antibiotic change policy.  Description contains the criteria that are approved Note: Policy Excludes:  Esophagectomy patientsPHARMACIST - PHYSICIAN COMMUNICATION DR:    CONCERNING: IV to Oral Route Change Policy  RECOMMENDATION: This patient is receiving Protonix by the intravenous route.  Based on criteria approved by the Pharmacy and Therapeutics Committee, the intravenous medication(s) is/are being converted to the equivalent oral dose form(s).   DESCRIPTION: These criteria include:  The patient is eating (either orally or via tube) and/or has been taking other orally administered medications for a least 24 hours  The patient has no evidence of active gastrointestinal bleeding or impaired GI absorption (gastrectomy, short bowel, patient on TNA or NPO).  If you have questions about this conversion, please contact the Pharmacy Department  [x]   [phone]( [phone] )  [facility] []   [phone]( [phone] )  [facility]  []   [phone]( [phone] )  [facility] []   [phone]( [phone] )  [facility]  [place], Roberto Nonaka RomneyBennett, [facility] 09/20/2014 11:00 AM

## 2014-09-20 NOTE — Progress Notes (Signed)
PROGRESS NOTE  Honor JunesRonald J Bally ZHY:[id number] DOB: 04-Dec-1945 DOA: 09/19/2014 PCP: Kirk RuthsMCGOUGH,WILLIAM M, MD  Summary: 69 year old man with h/o alcohol abuse, possible dependence, Xanax abuse, chronic pain on narcotics with h/o misuse of Xanax and alcohol presented with visual hallucinations and AMS. Admitted for delirium tremens.  Assessment/Plan: 1. Delirium tremens. Improved--no apparent hallucinations. No seizure activity. 2. Acute encephalopathy with hallucinations and behavioral disturbance; much improved. 3. Alcoholic ketoacidosis. 4. 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 5. Suspected alcoholic hepatitis  6. Polysubstance abuse--alcohol, Xanax, hydrocodone 7. Hepatic steatosis, alcoholic cirrhosis with h/o hepatic encephalopathy, esophageal varices, ascites. On lactulose 8. COPD on Advair 9. Chronic pain 10. Alcohol-induced dementia per chart   Appears better; hemodynamics stable, calm and cooperative.  Continue CIWA, supportive care; currently has loose stools, resume lactulose when improved  Check CMP in AM  SW consult for substance abuse  Stop Naproxen indefinitely  Code Status: full code DVT prophylaxis: SCDs Family Communication: none Disposition Plan: home  Brendia Sacksaniel Goodrich, MD  [facility]  Pager [phone] If 7PM-7AM, please contact night-coverage at [email or web address], password [email or web address] 09/20/2014, 8:58 AM  LOS: 1 day   Consultants:    Procedures:    Antibiotics:    HPI/Subjective: "I'm so confused, don't know how I ended up here. I quit Xanax and hydrocodone cold [place]." Denies recent alcohol.  Objective: Filed Vitals:   09/20/14 0500 09/20/14 0600 09/20/14 0700 09/20/14 0800  BP: 135/76 143/75 150/76 113/78  Pulse: 86 76 70 78  Temp:      TempSrc:      Resp: 18 22 16 21   Height:      Weight: 84 kg (185 lb 3 oz)     SpO2: 95% 94% 98% 98%    Intake/Output Summary (Last 24 hours) at 09/20/14 0858 Last data  filed at 09/20/14 0743  Gross per 24 hour  Intake 1133.33 ml  Output    540 ml  Net 593.33 ml     Filed Weights   09/19/14 1707 09/19/14 2145 09/20/14 0500  Weight: 81.647 kg (180 lb) 81.4 kg (179 lb 7.3 oz) 84 kg (185 lb 3 oz)    Exam:     Afebrile, VSS  General: Appears calm and comfortable Eyes: PERRL, normal lids, irises  ENT: grossly normal hearing Cardiovascular: RRR, no m/r/g. No LE edema. Telemetry: SR, no arrhythmias  Respiratory: CTA bilaterally, no w/r/r. Normal respiratory effort. Abdomen: soft, ntnd Skin: no rash or induration seen  Musculoskeletal: grossly normal tone BUE/BLE; moves all extremities to command with grossly normal and symmetric strength arms and legs. Psychiatric: grossly normal mood and affect, speech fluent and appropriate. Disoriented to month, year; oriented to location Neurologic: CN appear intact  Data Reviewed:  BMP unremarkable  Pertinent data:  Labs  AST 79, T. Bili 3.3 on admit  U/A negative  UDS positive for BZD and opiates Imaging   CT head no acute abnormalities Other    Pending data:    Scheduled Meds: . [START ON 09/21/2014] Chlorhexidine Gluconate Cloth  6 each Topical Q0600  . cloNIDine  0.2 mg Oral BID  . enoxaparin (LOVENOX) injection  40 mg Subcutaneous Q24H  . mupirocin ointment  1 application Nasal BID  . pantoprazole (PROTONIX) IV  40 mg Intravenous QHS   Continuous Infusions: . sodium chloride 125 mL/hr at 09/20/14 [id number]    Principal Problem:   Delirium tremens Active Problems:   HTN (hypertension)   COPD (chronic obstructive pulmonary disease)   Alcohol abuse   Hepatic steatosis  Alcoholic hepatitis   Thrombocytopenia   Cirrhosis   Alcohol dependence with alcohol-induced persisting dementia   Acute encephalopathy   Alcoholic ketoacidosis   Time spent 25 minutes

## 2014-09-20 NOTE — Progress Notes (Signed)
Called report to Johnsie CancelLisa Bullins, RN on dept 300. Verbalized understanding.  Pt transferred to room 302 in safe and stable condition. Schonewitz, Candelaria StagersLeigh Anne 09/20/2014

## 2014-09-20 NOTE — Progress Notes (Signed)
Valium 5 mg IV given for increased restlessness, agitation, visual hallucination and attempting to get oob.

## 2014-09-20 NOTE — Progress Notes (Signed)
Utilization review completed.  

## 2014-09-20 NOTE — Progress Notes (Signed)
Alert to self only, restless, visual hallucination, pulling at medical equipment ( telemetry, foley, rectal tube) hand mitts remains on, given ativan 2 mg CIWA score 16-17.

## 2014-09-20 NOTE — Progress Notes (Signed)
Nasal gastric Tube unsuccessful during insertion via both nostrils.

## 2014-09-21 LAB — COMPREHENSIVE METABOLIC PANEL
ALBUMIN: 2.9 g/dL — AB (ref 3.5–5.2)
ALT: 26 U/L (ref 0–53)
ANION GAP: 5 (ref 5–15)
AST: 64 U/L — ABNORMAL HIGH (ref 0–37)
Alkaline Phosphatase: 65 U/L (ref 39–117)
BUN: 20 mg/dL (ref 6–23)
CALCIUM: 8.4 mg/dL (ref 8.4–10.5)
CO2: 25 mmol/L (ref 19–32)
Chloride: 105 mmol/L (ref 96–112)
Creatinine, Ser: 0.78 mg/dL (ref 0.50–1.35)
Glucose, Bld: 178 mg/dL — ABNORMAL HIGH (ref 70–99)
POTASSIUM: 3.4 mmol/L — AB (ref 3.5–5.1)
Sodium: 135 mmol/L (ref 135–145)
Total Bilirubin: 2 mg/dL — ABNORMAL HIGH (ref 0.3–1.2)
Total Protein: 5.9 g/dL — ABNORMAL LOW (ref 6.0–8.3)

## 2014-09-21 LAB — HEPATITIS PANEL, ACUTE
HCV Ab: NEGATIVE
HEP A IGM: NONREACTIVE
Hep B C IgM: NONREACTIVE
Hepatitis B Surface Ag: NEGATIVE

## 2014-09-21 MED ORDER — FOLIC ACID 1 MG PO TABS: 1.0000 mg | ORAL_TABLET | Freq: Every day | ORAL | Status: DC

## 2014-09-21 MED ORDER — POTASSIUM CHLORIDE CRYS ER 20 MEQ PO TBCR: 40.0000 meq | EXTENDED_RELEASE_TABLET | Freq: Once | ORAL | Status: DC

## 2014-09-21 MED ORDER — THIAMINE HCL 100 MG PO TABS: 100.0000 mg | ORAL_TABLET | Freq: Every day | ORAL | Status: DC

## 2014-09-21 MED ORDER — ADULT MULTIVITAMIN W/MINERALS CH: 1.0000 | ORAL_TABLET | Freq: Every day | ORAL | Status: DC

## 2014-09-21 NOTE — Discharge Summary (Signed)
Physician Discharge Summary  Roberto [name] HQI:[id number] DOB: 1945/11/13 DOA: 09/19/2014  [facility]  Admit date: 09/19/2014 Discharge date: 09/21/2014  Recommendations for Outpatient Follow-up:  1. Continue to encourage alcohol abstinence 2. Recommend stopping smoking 3. Continue to encourage abstinence from Xanax and narcotics 4. Follow-up sequela of alcohol abuse including cirrhosis 5. Home health RN, physical therapy   Follow-up Information    Follow up with [facility].   Contact information:   [address] [place] [address] [phone]       [facility].   Why:  10am Eligability appointment   Contact information:   [address] [place] [address] [phone]      Discharge Diagnoses:  1. Delirium tremens 2. Acute alcohol withdrawal, suspect benzodiazepine and narcotic withdrawal 3. Acute encephalopathy with hallucinations and behavioral disturbance 4. Alcoholic ketoacidosis 5. 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 Suspected alcoholic hepatitis 7. Polysubstance abuse 8. 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 COPD  Discharge Condition: Improved Disposition: Home  Diet recommendation: Heart healthy diet  Filed Weights   09/19/14 2145 09/20/14 0500 09/21/14 0527  Weight: 81.4 kg (179 lb 7.3 oz) 84 kg (185 lb 3 oz) 88.4 kg (194 lb 14.2 oz)    History of present illness:  69 year old man with h/o alcohol abuse, possible dependence, Xanax abuse, chronic pain on narcotics with h/o misuse of Xanax and alcohol presented with visual hallucinations and AMS. Admitted for delirium tremens.  Hospital Course:  [name] was treated with supportive care and benzodiazepines. His condition rapidly improved with resolution of hallucinations and return of normal mental status within approximately 24 hours. Over the last 24 hours he  has had no benzodiazepines. Mental status appears stable. Hospitalization was uncomplicated. Patient has not had benzodiazepines, alcohol or narcotics for over a week. He was encouraged to maintain abstinence.   Delirium tremens. Resolved. No seizure activity.  Acute encephalopathy with hallucinations and behavioral disturbance; resolved. Secondary to alcohol withdrawal.  Alcoholic ketoacidosis. Clinically resolved.  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 Stable.  Suspected alcoholic hepatitis , improving. Spontaneous resolution expected.  Polysubstance abuse--alcohol, Xanax, hydrocodone. Appreciate social work Administrator, sportsconsultation.  Hepatic steatosis, alcoholic cirrhosis with h/o hepatic encephalopathy, esophageal varices, ascites. On lactulose.  COPD on Advair  Chronic pain  Alcohol-induced dementia per chart  Consultants:  none  Procedures: none  Discharge Instructions  Discharge Instructions    Diet - low sodium heart healthy    Complete by:  As directed      Discharge instructions    Complete by:  As directed   Call your physician or seek immediate medical attention for hallucinations, fever or worsening of condition.     Increase activity slowly    Complete by:  As directed           Current Discharge Medication List    START taking these medications   Details  folic acid (FOLVITE) 1 MG tablet Take 1 tablet (1 mg total) by mouth daily.    Multiple Vitamin (MULTIVITAMIN WITH MINERALS) TABS tablet Take 1 tablet by mouth daily.    thiamine 100 MG tablet Take 1 tablet (100 mg total) by mouth daily.      CONTINUE these medications which have NOT CHANGED   Details  omeprazole (PRILOSEC) 20 MG capsule Take 20 mg by mouth daily as needed (for acid reflux).    Fluticasone-Salmeterol (ADVAIR) 250-50 MCG/DOSE  AEPB Inhale 1 puff into the lungs every 12 (twelve) hours.    lactulose (CHRONULAC) 10 GM/15ML solution Take 10 g by mouth 2  (two) times daily as needed (Constipation).    NITROSTAT 0.4 MG SL tablet Place under the tongue every 5 (five) minutes as needed for chest pain.       STOP taking these medications     ALPRAZolam (XANAX) 1 MG tablet      HYDROcodone-acetaminophen (NORCO) 10-325 MG per tablet      cloNIDine (CATAPRES) 0.2 MG tablet      naproxen (NAPROSYN) 500 MG tablet      ondansetron (ZOFRAN ODT) 8 MG disintegrating tablet      pantoprazole (PROTONIX) 40 MG tablet        No Known Allergies  The results of significant diagnostics from this hospitalization (including imaging, microbiology, ancillary and laboratory) are listed below for reference.    Significant Diagnostic Studies: Ct Head Wo Contrast  09/19/2014   CLINICAL DATA:  Mental status change. Elevated ammonia. Hallucinations.  EXAM: CT HEAD WITHOUT CONTRAST  TECHNIQUE: Contiguous axial images were obtained from the base of the skull through the vertex without intravenous contrast.  COMPARISON:  05/18/2012  FINDINGS: Ventricles are normal in configuration. There is ventricular and sulcal enlargement reflecting mild volume loss. No hydrocephalus.  There are no parenchymal masses or mass effect. There is no evidence of a cortical infarct. Minor periventricular white matter hypoattenuation is noted consistent with chronic microvascular ischemic change.  There are no extra-axial masses or abnormal fluid collections.  There is no intracranial hemorrhage.  Visualized sinuses and mastoid air cells are clear.  IMPRESSION: 1. No acute intracranial abnormalities. 2. Mild volume loss and minor chronic microvascular ischemic change, stable from the prior CT.   Electronically Signed   By: Amie Portland M.D.   On: 09/19/2014 18:52    Microbiology: Recent Results (from the past 240 hour(s))  MRSA PCR Screening     Status: Abnormal   Collection Time: 09/19/14  9:30 PM  Result Value Ref Range Status   MRSA by PCR RESULT CALLED TO, READ BACK BY AND VERIFIED  WITH: (A) NEGATIVE Final    Comment:  [name],A @ 0309 ON 09/20/14 BY WOODIE,J        The GeneXpert MRSA Assay (FDA approved for NASAL specimens only), is one component of a comprehensive MRSA colonization surveillance program. It is not intended to diagnose MRSA infection nor to guide or monitor treatment for MRSA infections.   Clostridium Difficile by PCR     Status: None   Collection Time: 09/19/14 10:25 PM  Result Value Ref Range Status   C difficile by pcr NEGATIVE NEGATIVE Final     Labs: Basic Metabolic Panel:  Recent Labs Lab 09/19/14 1744 09/20/14 0516 09/21/14 0558  NA 135 137 135  K 5.0 4.1 3.4*  CL 102 107 105  CO2 GLUCOSE 113* 106* 178*  BUN 18 25* 20  CREATININE 0.95 0.89 0.78  CALCIUM 9.0 8.4 8.4  MG  --  1.8  --   PHOS  --  3.7  --    Liver Function Tests:  Recent Labs Lab 09/19/14 1744 09/21/14 0558  AST 79* 64*  ALT 34 26  ALKPHOS 82 65  BILITOT 3.3* 2.0*  PROT 7.3 5.9*  ALBUMIN 3.6 2.9*    Recent Labs Lab 09/19/14 1744  LIPASE 23    Recent Labs Lab 09/19/14 1744  AMMONIA 22   CBC:  Recent Labs Lab 09/19/14 1744 09/20/14 0516  WBC 9.3 7.4  NEUTROABS 7.5  --   HGB 14.3 12.5*  HCT 40.6 36.6*  MCV 101.5* 101.4*  PLT 88* 76*   Cardiac Enzymes:  Recent Labs Lab 09/19/14 1744  TROPONINI <0.03    Principal Problem:   Delirium tremens Active Problems:   HTN (hypertension)   COPD (chronic obstructive pulmonary disease)   Alcohol abuse   Hepatic steatosis   Alcoholic hepatitis   Thrombocytopenia   Cirrhosis   Alcohol dependence with alcohol-induced persisting dementia   Acute encephalopathy   Alcoholic ketoacidosis   Time coordinating discharge: 35 minutes  Signed:  Brendia Sacks, MD [facility] 09/21/2014, 4:04 PM

## 2014-09-21 NOTE — Progress Notes (Signed)
Foley catheter and Flexiseal removed. Discharge instruction reviewed with patient. IV removed. No distress noted. Taken to lobby via wheelchair.

## 2014-09-21 NOTE — Progress Notes (Signed)
PROGRESS NOTE  Roberto [name] ZOX:[id number] DOB: 05/16/1946 DOA: 09/19/2014 PCP: Kirk Ruths, MD [facility]  Summary: 69 year old man with h/o alcohol abuse, possible dependence, Xanax abuse, chronic pain on narcotics with h/o misuse of Xanax and alcohol presented with visual hallucinations and AMS. Admitted for delirium tremens.  Assessment/Plan: 1. Delirium tremens. Resolved. No seizure activity. 2. Acute encephalopathy with hallucinations and behavioral disturbance; resolved. Secondary to alcohol withdrawal. 3. Alcoholic ketoacidosis. Clinically resolved. 4. 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 Stable. 5. Suspected alcoholic hepatitis , improving. Spontaneous resolution expected. 6. Polysubstance abuse--alcohol, Xanax, hydrocodone. Appreciate social work Administrator, sports. 7. Hepatic steatosis, alcoholic cirrhosis with h/o hepatic encephalopathy, esophageal varices, ascites. On lactulose. 8. COPD on Advair 9. Chronic pain 10. Alcohol-induced dementia per chart   Overall doing quite well. Delirium tremens has resolved. He has required no lorazepam in the last 24 hours and remains alert and conversant. He was seen by social work with outpatient resources suggested. Acute illness related to "cold [place]" abstinence from Xanax and alcohol or last week.  Stop Naproxen indefinitely given liver disease  Discharge today  Brendia Sacks, MD  [facility]  Pager [phone] If 7PM-7AM, please contact night-coverage at [email or web address], password [facility] 09/21/2014, 3:39 PM  LOS: 2 days   Consultants:    Procedures:    Antibiotics:    HPI/Subjective: Doing well, ready to go home.  Objective: Filed Vitals:   09/20/14 1634 09/20/14 2033 09/21/14 0527 09/21/14 0900  BP:  116/65 116/64 112/69  Pulse:  62 55 61  Temp: 98.2 F (36.8 C) 99.8 F (37.7 C) 98.7 F (37.1 C) 98.6 F (37 C)  TempSrc: Oral Oral Oral Oral    Resp:  Height:      Weight:   88.4 kg (194 lb 14.2 oz)   SpO2:  97% 96% 93%    Intake/Output Summary (Last 24 hours) at 09/21/14 1539 Last data filed at 09/21/14 1000  Gross per 24 hour  Intake    600 ml  Output   1050 ml  Net   -450 ml     Filed Weights   09/19/14 2145 09/20/14 0500 09/21/14 0527  Weight: 81.4 kg (179 lb 7.3 oz) 84 kg (185 lb 3 oz) 88.4 kg (194 lb 14.2 oz)    Exam:     Afebrile, VSS, no hypoxia General:  Appears comfortable, calm. Cardiovascular: Regular rate and rhythm, no murmur, rub or gallop. No lower extremity edema. Telemetry: Sinus rhythm, no arrhythmias  Respiratory: Clear to auscultation bilaterally, no wheezes, rales or rhonchi. Normal respiratory effort. Psychiatric: grossly normal mood and affect, speech fluent and appropriate  Data Reviewed:  BMP unremarkable except K+ 3.4. AST elevated but trending down  T bili trending done  Plts stable 76  Pertinent data:  Labs  AST 79, T. Bili 3.3 on admit  U/A negative  UDS positive for BZD and opiates Imaging   CT head no acute abnormalities Other    Pending data:    Scheduled Meds: . Chlorhexidine Gluconate Cloth  6 each Topical Q0600  . cloNIDine  0.2 mg Oral BID  . folic acid  1 mg Oral Daily  . multivitamin with minerals  1 tablet Oral Daily  . mupirocin ointment  1 application Nasal BID  . pantoprazole  40 mg Oral QHS  . thiamine  100 mg Oral Daily   Continuous Infusions:    Principal Problem:   Delirium tremens Active Problems:   HTN (  hypertension)   COPD (chronic obstructive pulmonary disease)   Alcohol abuse   Hepatic steatosis   Alcoholic hepatitis   Thrombocytopenia   Cirrhosis   Alcohol dependence with alcohol-induced persisting dementia   Acute encephalopathy   Alcoholic ketoacidosis

## 2014-09-21 NOTE — Clinical Social Work Psychosocial (Signed)
Clinical Social Work Department BRIEF PSYCHOSOCIAL ASSESSMENT 09/21/2014  Patient:  Roberto [name], Roberto [name]     Account Number:  [id number]     Admit date:  09/19/2014  Clinical Social Worker:  Roberto [name] Date/Time:  09/21/2014 03:07 PM  Referred by:  Physician  Date Referred:  09/21/2014 Referred for  Substance Abuse   Other Referral:   Interview type:  Patient Other interview type:    PSYCHOSOCIAL DATA Living Status:  ALONE Admitted from facility:   Level of care:   Primary support name:  Roberto [name] Primary support relationship to patient:  SIBLING Degree of support available:   supportive    CURRENT CONCERNS Current Concerns  Substance Abuse   Other Concerns:    SOCIAL WORK ASSESSMENT / PLAN CSW met with pt at bedside following referral from MD for substance abuse. Pt alert and oriented and reports he has been living at the [facility] for the past 3 months. 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 Pt describes his best support as his brother, Roberto [name].    Pt reports that 9 days ago he stopped alcohol and pain medication cold [place]. He came to Roberto [name] due to shaking and hallucinations. Pt states these were very "scary" to him and he does not want to go through that again. Pt was a social drinker for many years and started drinking more heavily about 15 years ago. He reports a divorce and other relationship issues as a primary reason for this. While pt was caring for mother, he reports caregiver fatigue and tended to drink more during this time as well.  Most recently, pt admits to drinking 2-3 beers a day and a 6 pack on the weekend. Pt also admits to occasional wine and liquor, but could not come up with amounts. He has been sober for a week "here and there," but he intends to remain sober now. Pt states that he stopped for his family and they have provided a lot of encouragement for him. He does not feel he will need any treatment at this time. Pt accepted  information on AA meetings and [facility] if needed. SBIRT completed and pt scored 16, indicating high risk.    Pt states his primary concern right now is weakness. He feels he will get stronger every day, but is not at his baseline. CSW discussed d/c plan with pt and he does not want to go to SNF. He states that home health should be sufficient. CM notified.   Assessment/plan status:  Referral to [employer] Other assessment/ plan:   Information/referral to community resources:   CM for home health  Rethinking Drinking booklet  AA meeting schedule  [facility]    PATIENT'S/FAMILY'S RESPONSE TO PLAN OF CARE: Pt accepted information provided by CSW, but indicated that he did not need any assistance for substance abuse. CSW will sign off, but can be reconsulted if needed.       Roberto [name], [place]

## 2014-09-21 NOTE — Care Management Note (Signed)
CARE MANAGEMENT NOTE 09/21/2014  Patient:  Roberto [name],Roberto [name]   Account Number:  [id number]  Date Initiated:  09/21/2014  Documentation initiated by:  Kathyrn SheriffHILDRESS,Alleene Stoy  Subjective/Objective Assessment:   Pt admitted for delerium tremons. Pt lives alone in a local hotel. Pt plans to return to the hotal at discharge. Pt has no HH services, DME's or med needs prior to admission.     Action/Plan:   Pt says he is no longer active with his PCP because he failed a drug screen. [facility] and [facility] Al[facility]inda [facility]othian [facility]'s choice, notified of referral and [facility]   Anticipated DC Date:  09/21/2014   Anticipated DC Plan:  HOME W HOME HEALTH SERVICES      DC Planning Services  CM consult      [facility]  [facility]ALTH   Choice offered to / List presented to:  C-1 Patient        HH arranged  HH-1 RN      [facility]  [facility]   Status of service:  Completed, signed off Medicare Important Message given?   (If response is "NO", the following Medicare IM given date fields will be blank) Date Medicare IM given:   Medicare IM given by:   Date Additional Medicare IM given:   Additional Medicare IM given by:    Discharge Disposition:  HOME W HOME HEALTH SERVICES  Per UR Regulation:  Reviewed for med. necessity/level of care/duration of stay  If discussed at Long Length of Stay Meetings, dates discussed:    Comments:  09/21/2014 1100 Kathyrn SheriffJessica Laray Rivkin, RN, MSN, CM [facility]. SW consult made for substance abuse. CSW is aware. No further CM needs identified.

## 2014-09-22 LAB — HIV ANTIBODY (ROUTINE TESTING W REFLEX): HIV Screen 4th Generation wRfx: NONREACTIVE

## 2014-09-30 ENCOUNTER — Encounter (INDEPENDENT_AMBULATORY_CARE_PROVIDER_SITE_OTHER): Payer: Self-pay | Admitting: *Deleted

## 2014-10-08 ENCOUNTER — Ambulatory Visit (INDEPENDENT_AMBULATORY_CARE_PROVIDER_SITE_OTHER): Payer: Medicare Other | Admitting: Internal Medicine

## 2014-12-04 ENCOUNTER — Encounter (HOSPITAL_COMMUNITY): Payer: Self-pay | Admitting: *Deleted

## 2014-12-04 ENCOUNTER — Emergency Department (HOSPITAL_COMMUNITY)
Admission: EM | Admit: 2014-12-04 | Discharge: 2014-12-04 | Disposition: A | Discharge status: Home / Self Care | Payer: Medicare Other | Attending: Emergency Medicine | Admitting: Emergency Medicine

## 2014-12-04 ENCOUNTER — Emergency Department (HOSPITAL_COMMUNITY): Payer: Medicare Other

## 2014-12-04 DIAGNOSIS — R079 Chest pain, unspecified: Secondary | ICD-10-CM | POA: Diagnosis not present

## 2014-12-04 DIAGNOSIS — R202 Paresthesia of skin: Secondary | ICD-10-CM

## 2014-12-04 DIAGNOSIS — H9209 Otalgia, unspecified ear: Secondary | ICD-10-CM | POA: Insufficient documentation

## 2014-12-04 DIAGNOSIS — Z720 Tobacco use: Secondary | ICD-10-CM | POA: Insufficient documentation

## 2014-12-04 DIAGNOSIS — M542 Cervicalgia: Secondary | ICD-10-CM | POA: Diagnosis not present

## 2014-12-04 DIAGNOSIS — Z8669 Personal history of other diseases of the nervous system and sense organs: Secondary | ICD-10-CM | POA: Insufficient documentation

## 2014-12-04 DIAGNOSIS — Z79899 Other long term (current) drug therapy: Secondary | ICD-10-CM | POA: Diagnosis not present

## 2014-12-04 DIAGNOSIS — Z87440 Personal history of urinary (tract) infections: Secondary | ICD-10-CM | POA: Diagnosis not present

## 2014-12-04 DIAGNOSIS — Z8781 Personal history of (healed) traumatic fracture: Secondary | ICD-10-CM | POA: Diagnosis not present

## 2014-12-04 DIAGNOSIS — I10 Essential (primary) hypertension: Secondary | ICD-10-CM | POA: Diagnosis not present

## 2014-12-04 DIAGNOSIS — Z9861 Coronary angioplasty status: Secondary | ICD-10-CM | POA: Diagnosis not present

## 2014-12-04 DIAGNOSIS — Z8719 Personal history of other diseases of the digestive system: Secondary | ICD-10-CM | POA: Diagnosis not present

## 2014-12-04 DIAGNOSIS — R200 Anesthesia of skin: Secondary | ICD-10-CM | POA: Insufficient documentation

## 2014-12-04 DIAGNOSIS — J441 Chronic obstructive pulmonary disease with (acute) exacerbation: Secondary | ICD-10-CM | POA: Insufficient documentation

## 2014-12-04 DIAGNOSIS — F1027 Alcohol dependence with alcohol-induced persisting dementia: Secondary | ICD-10-CM | POA: Diagnosis not present

## 2014-12-04 LAB — COMPREHENSIVE METABOLIC PANEL
ALBUMIN: 3.6 g/dL (ref 3.5–5.0)
ALT: 32 U/L (ref 17–63)
ANION GAP: 7 (ref 5–15)
AST: 54 U/L — ABNORMAL HIGH (ref 15–41)
Alkaline Phosphatase: 70 U/L (ref 38–126)
BILIRUBIN TOTAL: 0.7 mg/dL (ref 0.3–1.2)
BUN: 9 mg/dL (ref 6–20)
CO2: 28 mmol/L (ref 22–32)
Calcium: 8.6 mg/dL — ABNORMAL LOW (ref 8.9–10.3)
Chloride: 105 mmol/L (ref 101–111)
Creatinine, Ser: 0.93 mg/dL (ref 0.61–1.24)
GFR calc non Af Amer: >60 mL/min (ref >60)
GLUCOSE: 171 mg/dL — AB (ref 70–99)
Potassium: 3.4 mmol/L — ABNORMAL LOW (ref 3.5–5.1)
SODIUM: 140 mmol/L (ref 135–145)
Total Protein: 7.2 g/dL (ref 6.5–8.1)

## 2014-12-04 LAB — TROPONIN I

## 2014-12-04 LAB — PROTIME-INR
INR: 1.18 (ref 0.00–1.49)
Prothrombin Time: 15.1 seconds (ref 11.6–15.2)

## 2014-12-04 LAB — CBC WITH DIFFERENTIAL/PLATELET
Basophils Absolute: 0.1 10*3/uL (ref 0.0–0.1)
Basophils Relative: 1 % (ref 0–1)
EOS ABS: 0.3 10*3/uL (ref 0.0–0.7)
Eosinophils Relative: 4 % (ref 0–5)
HCT: 41.8 % (ref 39.0–52.0)
HEMOGLOBIN: 14 g/dL (ref 13.0–17.0)
Lymphocytes Relative: 35 % (ref 12–46)
Lymphs Abs: 2.2 10*3/uL (ref 0.7–4.0)
MCH: 34.4 pg — ABNORMAL HIGH (ref 26.0–34.0)
MCHC: 33.5 g/dL (ref 30.0–36.0)
MCV: 102.7 fL — ABNORMAL HIGH (ref 78.0–100.0)
MONO ABS: 0.4 10*3/uL (ref 0.1–1.0)
Monocytes Relative: 7 % (ref 3–12)
Neutro Abs: 3.2 10*3/uL (ref 1.7–7.7)
Neutrophils Relative %: 52 % (ref 43–77)
PLATELETS: 107 10*3/uL — AB (ref 150–400)
RBC: 4.07 MIL/uL — AB (ref 4.22–5.81)
RDW: 14.5 % (ref 11.5–15.5)
WBC: 6.2 10*3/uL (ref 4.0–10.5)

## 2014-12-04 LAB — I-STAT TROPONIN, ED: Troponin i, poc: 0 ng/mL (ref 0.00–0.08)

## 2014-12-04 LAB — LIPASE, BLOOD: Lipase: 27 U/L (ref 22–51)

## 2014-12-04 NOTE — ED Provider Notes (Signed)
CSN: [id number]     Arrival date & time 12/04/14  1638 History   First MD Initiated Contact with Patient 12/04/14 1647     Chief Complaint  Patient presents with  . Chest Pain     (Consider location/radiation/quality/duration/timing/severity/associated sxs/prior Treatment) Patient is a 69 y.o. male presenting with chest pain. The history is provided by the patient.  Chest Pain Associated symptoms: numbness and shortness of breath   Associated symptoms: no abdominal pain, no fever, no headache, no nausea, not vomiting and no weakness    patient with several complaints main complaints of an left hand numbness for the past 3 days. Intermittent chest pain substernal lasting only seconds sharp in nature. Not getting any worse over the past few days. Patient is a recovering alcoholic and substance abuse abuser. Patient has been clean for several months.  Past Medical History  Diagnosis Date  . COPD (chronic obstructive pulmonary disease)   . Hypertension   . Hepatic steatosis 12/14/2011  . Compression fracture of L1 lumbar vertebra 12/14/2011  . C2 cervical fracture   . Duodenal ulcer with hemorrhage 12/15/2011    Likely source of bleeding.per EGD; Dr. Karilyn Cota  . Esophageal varices without bleeding 12/15/2011    per EGD  . Cirrhosis 12/16/2011    per ultrasound  . Hx of substance abuse 12/17/2011  . 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 Hepatic encephalopathy   . Helicobacter pylori antibody positive 12/19/2011  . Encephalopathy 05/18/2012  . UTI (urinary tract infection) 12/13/2011  . Ascites 05/18/2012    S/p paracentesis yielding 7880 mL  . Cirrhosis    Past Surgical History  Procedure Laterality Date  . Coronary angioplasty with stent placement     History reviewed. No pertinent family history. History  Substance Use Topics  . Smoking status: Current Every Day Smoker -- 1.00 packs/day    Types: Cigarettes  . Smokeless tobacco: Current User      Comment: 1 1/2 pack a day  . Alcohol Use: Yes     Comment: Patient states that he drinks 15 beers a week/last etoh 2 weeks ago 12/04/2014    Review of Systems  Constitutional: Negative for fever.  HENT: Positive for ear pain. Negative for congestion.   Eyes: Negative for visual disturbance.  Respiratory: Positive for shortness of breath.   Cardiovascular: Positive for chest pain.  Gastrointestinal: Negative for nausea, vomiting and abdominal pain.  Genitourinary: Negative for dysuria.  Musculoskeletal: Positive for neck pain.  Skin: Negative for rash.  Neurological: Positive for numbness. Negative for speech difficulty, weakness and headaches.  Hematological: Does not bruise/bleed easily.  Psychiatric/Behavioral: Negative for confusion.      Allergies  Review of patient's allergies indicates no known allergies.  Home Medications   Prior to Admission medications   Medication Sig Start Date End Date Taking? Authorizing Provider  albuterol (PROVENTIL) (2.5 MG/3ML) 0.083% nebulizer solution Take 2.5 mg by nebulization every 6 (six) hours as needed for wheezing or shortness of breath.   Yes Historical Provider, MD  ALPRAZolam Prudy Feeler) 1 MG tablet Take 1 mg by mouth 4 (four) times daily as needed. For anxiety 10/20/14  Yes Historical Provider, MD  HYDROcodone-acetaminophen (NORCO) 10-325 MG per tablet Take 1 tablet by mouth 5 (five) times daily as needed. For pain. (09/27/2014) 09/27/14  Yes Historical Provider, MD  Multiple Vitamin (MULTIVITAMIN WITH MINERALS) TABS tablet Take 1 tablet by mouth daily. 09/21/14  Yes Standley Brooking, MD  folic acid (FOLVITE) 1 MG tablet  Take 1 tablet (1 mg total) by mouth daily. Patient not taking: Reported on 12/04/2014 09/21/14   Standley Brookinganiel P Goodrich, MD  NITROSTAT 0.4 MG SL tablet Place under the tongue every 5 (five) minutes as needed for chest pain.  06/28/13   Historical Provider, MD  thiamine 100 MG tablet Take 1 tablet (100 mg total) by mouth  daily. Patient not taking: Reported on 12/04/2014 09/21/14   Standley Brookinganiel P Goodrich, MD   BP 142/81 mmHg  Pulse 66  Temp(Src) 98.6 F (37 C) (Oral)  Resp 18  Ht 6\' 1"  (1.854 m)  Wt 210 lb (95.255 kg)  BMI 27.71 kg/m2  SpO2 96% Physical Exam  Constitutional: He is oriented to person, place, and time. He appears well-developed and well-nourished. No distress.  HENT:  Head: Normocephalic and atraumatic.  Right Ear: External ear normal.  Left Ear: External ear normal.  Mouth/Throat: Oropharynx is clear and moist.  Eyes: Conjunctivae and EOM are normal. Pupils are equal, round, and reactive to light.  Neck: Normal range of motion.  Cardiovascular: Normal rate, regular rhythm and normal heart sounds.   No murmur heard. Pulmonary/Chest: Effort normal and breath sounds normal. No respiratory distress.  Abdominal: Soft. Bowel sounds are normal. There is no tenderness.  Musculoskeletal: Normal range of motion.  Neurological: He is alert and oriented to person, place, and time. No cranial nerve deficit. He exhibits normal muscle tone. Coordination normal.  Skin: Skin is warm. No rash noted.  Nursing note and vitals reviewed.   ED Course  Procedures (including critical care time) Labs Review Labs Reviewed  COMPREHENSIVE METABOLIC PANEL - Abnormal; Notable for the following:    Potassium 3.4 (*)    Glucose, Bld 171 (*)    Calcium 8.6 (*)    AST 54 (*)    All other components within normal limits  CBC WITH DIFFERENTIAL/PLATELET - Abnormal; Notable for the following:    RBC 4.07 (*)    MCV 102.7 (*)    MCH 34.4 (*)    Platelets 107 (*)    All other components within normal limits  TROPONIN I  LIPASE, BLOOD  PROTIME-INR  I-STAT TROPOININ, ED    Imaging Review Dg Chest 2 View  12/04/2014   CLINICAL DATA:  Chest pain, left arm numbness, and right hand tingling.  EXAM: CHEST  2 VIEW  COMPARISON:  Chest CT and radiographs 07/23/2014  FINDINGS: The cardiomediastinal silhouette is within  normal limits. Thoracic aortic calcification is noted. The lungs remain hyperinflated. Mild scarring is noted in the left lung base. No segmental airspace consolidation, edema, pleural effusion, or pneumothorax is identified. No acute osseous abnormality is identified.  IMPRESSION: No active cardiopulmonary disease.   Electronically Signed   By: Sebastian AcheAllen  Grady   On: 12/04/2014 18:22   Ct Head Wo Contrast  12/04/2014   CLINICAL DATA:  Left hand numbness and tingling. No reported injury.  EXAM: CT HEAD WITHOUT CONTRAST  CT CERVICAL SPINE WITHOUT CONTRAST  TECHNIQUE: Multidetector CT imaging of the head and cervical spine was performed following the standard protocol without intravenous contrast. Multiplanar CT image reconstructions of the cervical spine were also generated.  COMPARISON:  Head CT dated 09/19/2014 and cervical spine CT dated 05/18/2012.  FINDINGS: CT HEAD FINDINGS  Diffusely enlarged ventricles and subarachnoid spaces. Patchy white matter low density in both cerebral hemispheres. No intracranial hemorrhage, mass lesion or CT evidence of acute infarction. Mild left mastoid air cell opacification.  CT CERVICAL SPINE FINDINGS  Multilevel degenerative changes  are again demonstrated. These include facet degenerative changes at multiple levels with associated grade 1 anterolisthesis at the C3-4, C4-5 and C6-7 levels and grade 1 retrolisthesis at the C5-6 level, without significant change. Moderate bilateral foraminal stenosis at the C5-6 level. Moderate left foraminal stenosis at the C6-7 level. No visible disc herniations or nerve root compression on these images. There is left C7 facet spur formation superiorly, best seen on coronal image number 25, simulating a fracture on axial images 68 and 69. Dense bilateral carotid artery calcifications are noted.  IMPRESSION: 1. No acute abnormality. 2. Stable mild diffuse cerebral atrophy and mild chronic small vessel white matter ischemic changes in both cerebral  hemispheres. 3. No significant change in mild cervical spine degenerative changes without CT evidence of neural compression. 4. Dense bilateral carotid artery atheromatous calcifications. 5. Mild left mastoid air cell opacification.   Electronically Signed   By: Beckie Salts M.D.   On: 12/04/2014 17:59   Ct Cervical Spine Wo Contrast  12/04/2014   CLINICAL DATA:  Left hand numbness and tingling. No reported injury.  EXAM: CT HEAD WITHOUT CONTRAST  CT CERVICAL SPINE WITHOUT CONTRAST  TECHNIQUE: Multidetector CT imaging of the head and cervical spine was performed following the standard protocol without intravenous contrast. Multiplanar CT image reconstructions of the cervical spine were also generated.  COMPARISON:  Head CT dated 09/19/2014 and cervical spine CT dated 05/18/2012.  FINDINGS: CT HEAD FINDINGS  Diffusely enlarged ventricles and subarachnoid spaces. Patchy white matter low density in both cerebral hemispheres. No intracranial hemorrhage, mass lesion or CT evidence of acute infarction. Mild left mastoid air cell opacification.  CT CERVICAL SPINE FINDINGS  Multilevel degenerative changes are again demonstrated. These include facet degenerative changes at multiple levels with associated grade 1 anterolisthesis at the C3-4, C4-5 and C6-7 levels and grade 1 retrolisthesis at the C5-6 level, without significant change. Moderate bilateral foraminal stenosis at the C5-6 level. Moderate left foraminal stenosis at the C6-7 level. No visible disc herniations or nerve root compression on these images. There is left C7 facet spur formation superiorly, best seen on coronal image number 25, simulating a fracture on axial images 68 and 69. Dense bilateral carotid artery calcifications are noted.  IMPRESSION: 1. No acute abnormality. 2. Stable mild diffuse cerebral atrophy and mild chronic small vessel white matter ischemic changes in both cerebral hemispheres. 3. No significant change in mild cervical spine  degenerative changes without CT evidence of neural compression. 4. Dense bilateral carotid artery atheromatous calcifications. 5. Mild left mastoid air cell opacification.   Electronically Signed   By: Beckie Salts M.D.   On: 12/04/2014 17:59     EKG Interpretation   Date/Time:  Friday Dec 04 2014 16:44:52 EDT Ventricular Rate:  73 PR Interval:  170 QRS Duration: 92 QT Interval:  401 QTC Calculation: 442 R Axis:   28 Text Interpretation:  Sinus rhythm Probable anteroseptal infarct, old No  significant change since last tracing Confirmed by Sadiya Durand  MD, Koleton Duchemin  [phone]) on 12/04/2014 4:49:25 PM      MDM   Final diagnoses:  Numbness and tingling in left hand  Chest pain  Numbness and tingling in left hand  Chest pain  Neck pain    Patient is recovering alcoholic and substance abuse including some illegal drugs. Patient has been alcohol free and drug free for good. Time. Patient with intermittent chest pain substernal area very sharp in nature but only lasting seconds. Been occurring over the last 2-3 days.  No change or anything worse today. The other concern patient has is some numbness to the left hand which is somewhat glovelike. Associated with some shortness of breath but he does have normal COPD and is not any worse than usual. No nausea no vomiting. Patient has some chronic neck pain as well. Workup without any significant findings. Compared to February liver function test are improving. EKG didn't have any significant changes. Troponins negative CT of the neck and head without evidence of any intracranial abnormalities. It is important to state that the numbness in the the hand is been present for 3 days. Would expect CT does show evidence of a stroke. Chest x-rays also negative for pneumonia. Labs without any significant abnormalities. Some mild hypokalemia with a potassium of 3.4.  Patient is stable for discharge home. Currently does not have a primary care doctor will be given a  resource guide.    Vanetta MuldersScott Candler Ginsberg, MD 12/04/14 1859

## 2014-12-04 NOTE — Discharge Instructions (Signed)
Today's workup without any significant findings. Your liver function is improving. Return for development of any chest pain the last 15 or 20 minutes or longer. Resource guide provided to help you find a regular doctor.   Emergency Department Resource Guide 1) Find a Doctor and Pay Out of Pocket Although you won't have to find out who is covered by your insurance plan, it is a good idea to ask around and get recommendations. You will then need to call the office and see if the doctor you have chosen will accept you as a new patient and what types of options they offer for patients who are self-pay. Some doctors offer discounts or will set up payment plans for their patients who do not have insurance, but you will need to ask so you aren't surprised when you get to your appointment.  2) Contact Your Local Health Department Not all health departments have doctors that can see patients for sick visits, but many do, so it is worth a call to see if yours does. If you don't know where your local health department is, you can check in your phone book. The CDC also has a tool to help you locate your state's health department, and many state websites also have listings of all of their local health departments.  3) Find a Walk-in Clinic If your illness is not likely to be very severe or complicated, you may want to try a walk in clinic. These are popping up all over the country in pharmacies, drugstores, and shopping centers. They're usually staffed by nurse practitioners or physician assistants that have been trained to treat common illnesses and complaints. They're usually fairly quick and inexpensive. However, if you have serious medical issues or chronic medical problems, these are probably not your best option.  No Primary Care Doctor: - Call Health Connect at  [phone] - they can help you locate a primary care doctor that  accepts your insurance, provides certain services, etc. - Physician Referral  Service- [phone]  Chronic Pain Problems: Organization         Address  Phone   Notes  [facility]  [phone] Patients need to be referred by their primary care doctor.   Medication Assistance: Organization         Address  Phone   Notes  [facility] Medication [facility] [address] [place], [place] [address] [phone]) [phone] --Must be a resident of [place] -- Must have NO insurance coverage whatsoever (no Medicaid/ Medicare, etc.) -- The pt. MUST have a primary care doctor that directs their care regularly and follows them in the community   MedAssist  [phone]   [employer]  [phone]    Agencies that provide inexpensive medical care: Organization         Address  Phone   Notes  [facility]  [phone]   [facility]    [phone]   [facility] [address] [place], [place] [address] [phone]   [facility] [address], [place] [phone]   [facility]anned Parenthood    [phone]) [phone]   [facility]    [phone]   [facility]  [address], [place] Phone:  [phone], Fax:  [phone]) [phone] Hours of Operation:  9 am - 6 pm, M-F.  Also accepts Medicaid/Medicare and self-pay.    [facility] for [place], [address], [place] Phone: [phone], Fax: [phone]. Hours of Operation:  8:30 am - 5:30 pm, M-F.  Also accepts Medicaid and self-pay.  [facility] [place] [address], [place] Phone: [phone]   [place], [place], [place] [phone], Ext. [phone] Mondays & Thursdays: 7-9 AM.  First 15 patients are seen on a first come, first serve basis.    [place] Providers:  Organization         Address  Phone   Notes  [facility] [address], [address], [place] [phone] Also accepts self-pay patients.  [facility] [address], [place]  [phone]   [place], [address], [place] [phone]   [facility] [address], [place] [phone]   Lucianne Lei [address], [place]   [phone] Only accepts [place] Access [place] patients after they have their name applied to their card.   Self-Pay (no insurance) in [facility]:  Organization         Address  Phone   Notes  Sickle Cell Patients, [facility] [place] [phone]   [facility] [place] [phone]   [facility] [place]  [place], [place],  [phone]   [facility]/Dr. Osei-Bonsu  [address] [place] or [address], [place] [phone] Phone number for both [place] and [place] locations is the same.  [facility] [address], [place] [phone]   [facility] [address], [place] or [address] [phone]   [facility] [address] [place] [phone], phone; [phone], fax Sees patients 1st and 3rd Saturday of every month.  Must not qualify for public or private insurance (i.e. Medicaid, Medicare, [place] Health Choice, Veterans' Benefits)  Household income should be no more than 200% of the poverty level The clinic cannot treat you if you are pregnant or think you are pregnant  Sexually transmitted diseases are not treated at the clinic.    Dental Care: Organization         Address  Phone  Notes  [facility] [place] [phone] Accepts children up to age 56 who are enrolled in  [place] or [place]; pregnant women with a Medicaid card; and children who have applied for Medicaid or [place] Health Choice, but were declined, whose parents can pay a reduced fee at time of service.  [facility] of [facility]  [address], [place] [phone] Accepts children up to age 13 who are enrolled in [place] or [place]; pregnant women with a Medicaid card; and children who have applied for Medicaid or [place] Health Choice, but were declined, whose parents can pay a reduced fee at time of service.  [place] [facility] Access PROGRAM  [place] [phone] Patients are seen by appointment only. Walk-ins are not accepted. [place] will see patients 92 years of age and older. Monday - Tuesday (8am-5pm) Most Wednesdays (8:30-5pm) $30 per visit, cash  only  [facility] Access PROGRAM  [address], [facility] [phone] Patients are seen by appointment only. Walk-ins are not accepted. [place] will see patients 14 years of age and older. One Wednesday Evening (Monthly: Volunteer Based).  $30 per visit, cash only  [place]  [phone] for adults; Children under age 48, call Graduate Pediatric Dentistry at [phone]. Children aged 39-14, please call [phone] to request a pediatric application.  Dental services are provided in all areas of dental care including fillings, crowns and bridges, complete and partial dentures, implants, gum treatment, root canals, and extractions. Preventive care is also provided. Treatment is provided to both adults and children. Patients are selected via a lottery and there is often a waiting list.   [facility] [address] [place]  [phone] [email or web address]   [facility] [address] [place], [place] [phone], Ext. [phone] Second and Fourth Thursday of each month, opens at 6:30  AM; Clinic ends at 9 AM.  Patients are seen on a first-come first-served basis, and a limited number are seen during each clinic.   [facility]  [address] Hillard Danker [place], [place] [phone]   Eligibility Requirements You must have lived in [place], [place], or [place] counties for at least the last three months.   You cannot be eligible for state or federal sponsored [employer], including [employer], [place], or [employer].   You generally cannot be eligible for healthcare insurance through your employer.    How to apply: Eligibility screenings are held every Tuesday and Wednesday afternoon from 1:00 pm until 4:00 pm. You do not need an appointment for the interview!  [facility] [address], [place], [place]   [place]  [place] Department  [place]  [phone]    Behavioral Health Resources in the Community: Intensive Outpatient Programs Organization         Address  Phone  Notes  [place]. [address], [place], [place] [phone]   [facility]patient [address] [place], [place]   ADS: [facility] Drug S[facility] [address], [place], [place]   [place] [address],  [place], [place] or [phone]   Substance Abuse Resources Organization         Address  Phone  Notes  Al[facility] Drug Services  [phone]   [place]  [phone]   [facility] [place]   Chinita Pester  [phone]   Residential & Outpatient Substance Abuse Program  [phone]   Psychological Services Organization         Address  Phone  Notes  [facility] [place]  [place]  [phone]   [place] [address], [place] or  [phone]    Mobile Crisis Teams Organization         Address  Phone  Notes  [facility], Mobile Crisis Care Unit  [phone]   [facility]  [address] [place], [place]   [place] Levels [address] [place] [phone]    Self-Help/Support Groups Organization         Address  Phone  Notes  [facility] of [place] - variety of support groups  336- [phone] Call for more information  Narcotics Anonymous (NA), [facility] [address], [place]  2 meetings at this location   Statisticianesidential Treatment Programs Organization         Address  Phone  Notes  [facility] [address],    [place]  [phone]   [facility]  [address], [place] [phone], [place], [place] [phone]   [facility] [address], [place] [phone] Admissions: 8am-3pm M-F  [facility] [address]    [place], [place] [phone]   [facility] [address], [place], [place] [phone]   [facility] [address]  [place], [place] [phone]   [facility] - Intensive Outpatient [address], [place], [place] [phone]   [facility] (Addiction Recovery Care [facility] [address]  [place], [place] [phone] or [phone]   [facility] ([facility]) [address] [place], [place] [phone] Accepts Medicaid  [facility] [address]  [place] [phone] Substance Abuse/Addiction Treatment   [facility] Behavioral Health Resources Organization         Address  Phone  Notes  [facility]  [phone]) [phone]   Angie FavaJulie Brannon, PhD [address], [address] [place], [place]   [phone] or [phone]) [phone]   [facility]   [address] [place], [place] [phone]) [phone]   [facility] [address],  [place], [place] [phone]) [phone] Insurance/Medicaid/sponsorship through [facility]  [facility] Families [address]                                    [place], [place] [phone]) [phone] Therapy/tele-psych/case  [facility] [address] [place].   [place], [place] [phone]) [phone]    Dr. Lolly MustacheArfeen  [phone](336) [phone]   [facility]  [employer] [facility] 1) [address], [place] 2) [address], [place] 3)  [address], [place] [phone]) [phone]) [phone]  [phone](336) [phone]   [facility] Child Abuse Hotline [phone](336) [phone] or [phone](336) [phone] (After Hours)     \

## 2014-12-04 NOTE — ED Notes (Signed)
Chest pain, intermittently, and numbness lt hand 2-3 days.

## 2014-12-08 ENCOUNTER — Encounter (INDEPENDENT_AMBULATORY_CARE_PROVIDER_SITE_OTHER): Payer: Self-pay | Admitting: Internal Medicine

## 2014-12-08 ENCOUNTER — Ambulatory Visit (INDEPENDENT_AMBULATORY_CARE_PROVIDER_SITE_OTHER): Payer: Medicare Other | Admitting: Internal Medicine

## 2014-12-08 VITALS — BP 124/82 | HR 80 | Temp 97.0°F | Ht 73.0 in | Wt 197.3 lb

## 2014-12-08 DIAGNOSIS — K7030 Alcoholic cirrhosis of liver without ascites: Secondary | ICD-10-CM

## 2014-12-08 NOTE — Progress Notes (Signed)
Subjective:    Patient ID: Roberto [name], male    DOB: 03-08-1946, 69 y.o.   MRN: [id number]  HPI Here today for f/u. He was last seen in December of 2014. Hx of alcoholic cirrhosis. Recent admission to [facility] for hallucinations and DTs in February. In May seen in the ED for chest pain.  Cardiac work up was negative. He says he has a hx of  Poly substance abuse.  He says he has not drank in a couple of weeks.  He says he has a hx of taking Xanax, Hydrocodone, and Cocaine.  He tells me he is doing okay. There really is no abdominal pain. He has acid reflux and takes a ?PPI for this. Appetite is good. No weight loss. He eats out all of his meal. He has lost 10 pounds since we saw him in December of 2014 (wt 207). He is walking daily. [facility] the [facility].       [place] Latest Ref Rng 12/04/2014 09/21/2014 09/20/2014  Glucose 70 - 99 mg/dL 725(D171(H) 664(Q178(H) 034(V106(H)  BUN 6 - 20 mg/dL 9 20 42(V25(H)  Creatinine 0.61 - 1.24 mg/dL 9.560.93 3.870.78 5.640.89  Sodium 135 - 145 mmol/L 140 135 137  Potassium 3.5 - 5.1 mmol/L 3.4(L) 3.4(L) 4.1  Chloride 101 - 111 mmol/L 105 105 107  CO2 22 - 32 mmol/L 28 25 23   Calcium 8.9 - 10.3 mg/dL 3.3(I8.6(L) 8.4 8.4  Total Protein 6.5 - 8.1 g/dL 7.2 5.9(L) -  Total Bilirubin 0.3 - 1.2 mg/dL 0.7 2.0(H) -  Alkaline Phos 38 - 126 U/L 70 65 -  AST 15 - 41 U/L 54(H) 64(H) -  ALT 17 - 63 U/L 32 26 -   CBC    Component Value Date/Time   WBC 6.2 12/04/2014 1654   RBC 4.07* 12/04/2014 1654   RBC 1.36* 12/13/2011 1145   HGB 14.0 12/04/2014 1654   HCT 41.8 12/04/2014 1654   PLT 107* 12/04/2014 1654   MCV 102.7* 12/04/2014 1654   MCH 34.4* 12/04/2014 1654   MCHC 33.5 12/04/2014 1654   RDW 14.5 12/04/2014 1654   LYMPHSABS 2.2 12/04/2014 1654   MONOABS 0.4 12/04/2014 1654   EOSABS 0.3 12/04/2014 1654   BASOSABS 0.1 12/04/2014 1654    06/30/2013: US abdomen: cirrhosis None.  IMPRESSION: 1. Echogenic liver with a slightly nodular contour and mild hepatomegaly as can seen  with hepatocellular disease. No focal hepatic mass is identified.  2. Mild splenomegaly.  3. Cholelithiasis.    05/19/2012 US guided paracentesis; IMPRESSION:  Ultrasound-guided paracentesis of 7880 ml of ascitic fluid. Fluid analysis was not consistent with spontaneous bacterial peritonitis.      Review of Systems Past Medical History  Diagnosis Date  . COPD (chronic obstructive pulmonary disease)   . Hypertension   . Hepatic steatosis 12/14/2011  . Compression fracture of L1 lumbar vertebra 12/14/2011  . C2 cervical fracture   . Duodenal ulcer with hemorrhage 12/15/2011    Likely source of bleeding.per EGD; Dr. Karilyn Cotaehman  . Esophageal varices without bleeding 12/15/2011    per EGD  . Cirrhosis 12/16/2011    per ultrasound  . Hx of substance abuse 12/17/2011  . 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 Hepatic encephalopathy   . Helicobacter pylori antibody positive 12/19/2011  . Encephalopathy 05/18/2012  . UTI (urinary tract infection) 12/13/2011  . Ascites 05/18/2012    S/p paracentesis yielding 7880 mL  . Cirrhosis     Past  Surgical History  Procedure Laterality Date  . Coronary angioplasty with stent placement      No Known Allergies  Current Outpatient Prescriptions on File Prior to Visit  Medication Sig Dispense Refill  . albuterol (PROVENTIL) (2.5 MG/3ML) 0.083% nebulizer solution Take 2.5 mg by nebulization every 6 (six) hours as needed for wheezing or shortness of breath.    . ALPRAZolam (XANAX) 1 MG tablet Take 1 mg by mouth 4 (four) times daily as needed. For anxiety    . folic acid (FOLVITE) 1 MG tablet Take 1 tablet (1 mg total) by mouth daily. (Patient not taking: Reported on 12/04/2014)     No current facility-administered medications on file prior to visit.        Objective:   Physical Exam Blood pressure 124/82, pulse 80, temperature 97 F (36.1 C), height 6\' 1"  (1.854 m), weight 197 lb 4.8 oz (89.495 kg). Alert and  oriented. Skin warm and dry. Oral mucosa is moist.   . Sclera anicteric, conjunctivae is pink. Thyroid not enlarged. No cervical lymphadenopathy. Lungs clear. Heart regular rate and rhythm.  Abdomen is soft. Bowel sounds are positive.Liver 2 fingerbreaths below mid costal margin. No abdominal masses felt. No tenderness.  No edema to lower extremities.          Assessment & Plan:  Alcoholic cirrhosis. He says he has not drank in over 2 weeks. US RUQ for surveillance. OV in 6 months. Patient advised to ask his PCP for a sleep aid.

## 2014-12-08 NOTE — Patient Instructions (Addendum)
Continue to avoid Etoh. OV in 6 months.  Follow up with PCP considering a sleep aid.

## 2014-12-10 ENCOUNTER — Ambulatory Visit (HOSPITAL_COMMUNITY)
Admission: RE | Admit: 2014-12-10 | Discharge: 2014-12-10 | Disposition: A | Discharge status: Home / Self Care | Payer: Medicare Other | Source: Ambulatory Visit | Attending: Internal Medicine | Admitting: Internal Medicine

## 2014-12-10 DIAGNOSIS — K7030 Alcoholic cirrhosis of liver without ascites: Secondary | ICD-10-CM | POA: Diagnosis not present

## 2014-12-10 DIAGNOSIS — K8020 Calculus of gallbladder without cholecystitis without obstruction: Secondary | ICD-10-CM | POA: Insufficient documentation

## 2014-12-16 ENCOUNTER — Telehealth (INDEPENDENT_AMBULATORY_CARE_PROVIDER_SITE_OTHER): Payer: Self-pay | Admitting: *Deleted

## 2014-12-16 DIAGNOSIS — K7030 Alcoholic cirrhosis of liver without ascites: Secondary | ICD-10-CM

## 2014-12-16 NOTE — Telephone Encounter (Signed)
.  Per Terri Setzer,NP patient will need to have lab work in 6 weeks. 

## 2014-12-23 ENCOUNTER — Other Ambulatory Visit (INDEPENDENT_AMBULATORY_CARE_PROVIDER_SITE_OTHER): Payer: Self-pay | Admitting: *Deleted

## 2014-12-23 ENCOUNTER — Encounter (INDEPENDENT_AMBULATORY_CARE_PROVIDER_SITE_OTHER): Payer: Self-pay | Admitting: *Deleted

## 2014-12-23 DIAGNOSIS — K7030 Alcoholic cirrhosis of liver without ascites: Secondary | ICD-10-CM

## 2014-12-24 ENCOUNTER — Encounter (INDEPENDENT_AMBULATORY_CARE_PROVIDER_SITE_OTHER): Payer: Self-pay | Admitting: *Deleted

## 2015-01-25 ENCOUNTER — Encounter (HOSPITAL_COMMUNITY): Payer: Self-pay | Admitting: *Deleted

## 2015-01-25 ENCOUNTER — Emergency Department (HOSPITAL_COMMUNITY)
Admission: EM | Admit: 2015-01-25 | Discharge: 2015-01-25 | Disposition: A | Discharge status: Home / Self Care | Payer: Medicare Other | Attending: Emergency Medicine | Admitting: Emergency Medicine

## 2015-01-25 DIAGNOSIS — Z8669 Personal history of other diseases of the nervous system and sense organs: Secondary | ICD-10-CM | POA: Insufficient documentation

## 2015-01-25 DIAGNOSIS — Z87440 Personal history of urinary (tract) infections: Secondary | ICD-10-CM | POA: Diagnosis not present

## 2015-01-25 DIAGNOSIS — Z8719 Personal history of other diseases of the digestive system: Secondary | ICD-10-CM | POA: Insufficient documentation

## 2015-01-25 DIAGNOSIS — Y9389 Activity, other specified: Secondary | ICD-10-CM | POA: Insufficient documentation

## 2015-01-25 DIAGNOSIS — Z8781 Personal history of (healed) traumatic fracture: Secondary | ICD-10-CM | POA: Diagnosis not present

## 2015-01-25 DIAGNOSIS — J449 Chronic obstructive pulmonary disease, unspecified: Secondary | ICD-10-CM | POA: Insufficient documentation

## 2015-01-25 DIAGNOSIS — Z8619 Personal history of other infectious and parasitic diseases: Secondary | ICD-10-CM | POA: Diagnosis not present

## 2015-01-25 DIAGNOSIS — Z79899 Other long term (current) drug therapy: Secondary | ICD-10-CM | POA: Diagnosis not present

## 2015-01-25 DIAGNOSIS — W260XXA Contact with knife, initial encounter: Secondary | ICD-10-CM | POA: Diagnosis not present

## 2015-01-25 DIAGNOSIS — Y9289 Other specified places as the place of occurrence of the external cause: Secondary | ICD-10-CM | POA: Insufficient documentation

## 2015-01-25 DIAGNOSIS — Z720 Tobacco use: Secondary | ICD-10-CM | POA: Insufficient documentation

## 2015-01-25 DIAGNOSIS — I10 Essential (primary) hypertension: Secondary | ICD-10-CM | POA: Insufficient documentation

## 2015-01-25 DIAGNOSIS — Y998 Other external cause status: Secondary | ICD-10-CM | POA: Insufficient documentation

## 2015-01-25 DIAGNOSIS — S61412A Laceration without foreign body of left hand, initial encounter: Secondary | ICD-10-CM | POA: Insufficient documentation

## 2015-01-25 MED ORDER — CEPHALEXIN 500 MG PO CAPS: 500.0000 mg | ORAL_CAPSULE | Freq: Four times a day (QID) | ORAL | Status: DC

## 2015-01-25 NOTE — ED Notes (Signed)
Pt alert & oriented x4, stable gait. Patient given discharge instructions, paperwork & prescription(s). Patient  instructed to stop at the registration desk to finish any additional paperwork. Patient verbalized understanding. Pt left department w/ no further questions. 

## 2015-01-25 NOTE — Discharge Instructions (Signed)
Non-Sutured Laceration A laceration is a cut or wound that goes through all layers of the skin and into the tissue just beneath the skin. Usually, these are stitched up or held together with tape or glue shortly after the injury occurred. However, if several or more hours have passed before getting care, too many germs (bacteria) get into the laceration. Stitching it closed would bring the risk of infection. If your health care provider feels your laceration is too old, it may be left open and then bandaged to allow healing from the bottom layer up. HOME CARE INSTRUCTIONS   Change the bandage (dressing) 2 times a day or as directed by your health care provider.  If the dressing or packing gauze sticks, soak it off with soapy water.  Wash the area with soap and water 2 times a day to remove all the creams or ointments, if used. Rinse off the soap. Pat the area dry with a clean towel. Look for signs of infection, such as redness, swelling, or a red line that goes away from the laceration.  Re-apply creams or ointments if they were used to bandage the laceration. This helps keep the bandage from sticking.  If the bandage becomes wet, dirty, or has a bad smell, change it as soon as possible.  Only take medicine as directed by your health care provider. You might need a tetanus shot now if:  You have no idea when you had the last one.  You have never had a tetanus shot before.  Your laceration had dirt in it.  Your laceration was dirty, and your last tetanus shot was more than 7 years ago.  Your laceration was clean, and your last tetanus shot was more than 10 years ago. If you need a tetanus shot, and you decide not to get one, there is a rare chance of getting tetanus. Sickness from tetanus can be serious. If you got a tetanus shot, your arm may swell and get red and warm to the touch at the shot site. This is common and not a problem. SEEK MEDICAL CARE IF:   You have redness, swelling, or  increasing pain in the laceration.  You notice a red line that goes away from your laceration.  You have pus coming from the laceration.  You have a fever.  You notice a bad smell coming from the laceration or dressing.  You notice something coming out of the laceration, such as wood or glass.  Your laceration is on your hand or foot and you are unable to properly move a finger or toe.  You have severe swelling around the laceration, causing pain and numbness.  You notice a change in color in your arm, hand, leg, or foot. MAKE SURE YOU:   Understand these instructions.  Will watch your condition.  Will get help right away if you are not doing well or get worse. Document Released: 06/14/2006 Document Revised: 07/22/2013 Document Reviewed: 01/04/2009 [facility] Patient Information 2015 [place], [place]. This information is not intended to replace advice given to you by your health care provider. Make sure you discuss any questions you have with your health care provider.

## 2015-01-25 NOTE — ED Provider Notes (Signed)
CSN: [id number]     Arrival date & time 01/25/15  0741 History   First MD Initiated Contact with Patient 01/25/15 0802     Chief Complaint  Patient presents with  . Hand Problem    lt     (Consider location/radiation/quality/duration/timing/severity/associated sxs/prior Treatment) The history is provided by the patient.   Roberto [name] is a 69 y.o. male presenting with a laceration to his left hand which occurred 2 days ago while he was using a knife.  He reports it bled moderately at the time of the injury.  He has been washing it and keeping it covered but it continues to be painful and has now noticed white drainage from the wound along with swelling and persistent pain.  Patient is right-handed and his tetanus is less than 27 years old.  He denies numbness or weakness distal to the injury site.    Past Medical History  Diagnosis Date  . COPD (chronic obstructive pulmonary disease)   . Hypertension   . Hepatic steatosis 12/14/2011  . Compression fracture of L1 lumbar vertebra 12/14/2011  . C2 cervical fracture   . Duodenal ulcer with hemorrhage 12/15/2011    Likely source of bleeding.per EGD; Dr. Karilyn Cota  . Esophageal varices without bleeding 12/15/2011    per EGD  . Cirrhosis 12/16/2011    per ultrasound  . Hx of substance abuse 12/17/2011  . 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 Hepatic encephalopathy   . Helicobacter pylori antibody positive 12/19/2011  . Encephalopathy 05/18/2012  . UTI (urinary tract infection) 12/13/2011  . Ascites 05/18/2012    S/p paracentesis yielding 7880 mL  . Cirrhosis    Past Surgical History  Procedure Laterality Date  . Coronary angioplasty with stent placement     History reviewed. No pertinent family history. History  Substance Use Topics  . Smoking status: Current Every Day Smoker -- 1.00 packs/day    Types: Cigarettes  . Smokeless tobacco: Current User     Comment: 1 1/2 pack a day  . Alcohol Use: Yes      Comment: Patient states that he drinks 15 beers a week/last etoh 2 weeks ago 12/04/2014    Review of Systems  Constitutional: Negative for fever and chills.  Respiratory: Negative for shortness of breath and wheezing.   Skin: Positive for wound.  Neurological: Negative for numbness.      Allergies  Review of patient's allergies indicates no known allergies.  Home Medications   Prior to Admission medications   Medication Sig Start Date End Date Taking? Authorizing Provider  albuterol (PROVENTIL) (2.5 MG/3ML) 0.083% nebulizer solution Take 2.5 mg by nebulization every 6 (six) hours as needed for wheezing or shortness of breath.    Historical Provider, MD  cephALEXin (KEFLEX) 500 MG capsule Take 1 capsule (500 mg total) by mouth 4 (four) times daily. 01/25/15   Burgess Amor, PA-C   BP 152/96 mmHg  Pulse 63  Temp(Src) 98 F (36.7 C) (Oral)  Resp 18  Ht  (1.854 m)  Wt 200 lb (90.719 kg)  BMI 26.39 kg/m2  SpO2 97% Physical Exam  Constitutional: He is oriented to person, place, and time. He appears well-developed and well-nourished.  HENT:  Head: Normocephalic.  Cardiovascular: Normal rate.   Pulmonary/Chest: Effort normal.  Neurological: He is alert and oriented to person, place, and time. No sensory deficit.  Skin: Laceration noted.  Patient has an irregular 1 cm subcutaneous laceration in the webspace between his foam  and index finger of his left hand.  The surrounding wound edges appear macerated and moist.  There is no surrounding erythema but he does have mild edema.  There is drainage of serous fluid, no purulent fluid noted.  Patient displays full range of motion of fingers.  Distal sensation intact.    ED Course  Procedures (including critical care time) Labs Review Labs Reviewed - No data to display  Imaging Review No results found.   EKG Interpretation None      MDM   Final diagnoses:  Laceration of hand with delay in treatment, left, initial  encounter    Wound care provided including saline and Betadine soak. Bulky dressing applied, advised to wash wound twice daily then reapply dressing.  abx provided. F/u with pcp in 2 days for recheck.  Pt was seen by Dr Estell HarpinZammit prior to dc home.    Burgess AmorJulie Mccabe Gloria, PA-C 01/25/15 [id number]  Bethann BerkshireJoseph Zammit, MD 01/25/15 [phone]

## 2015-01-25 NOTE — ED Notes (Signed)
Pt cut lt hand with a knife 2 days prior, noted swelling and redness to lt hand. Pt also states he has chronic peptic ulcer, pt states he is out of RX, also out of BP meds.

## 2015-03-11 ENCOUNTER — Ambulatory Visit (INDEPENDENT_AMBULATORY_CARE_PROVIDER_SITE_OTHER): Payer: Medicare Other | Admitting: Internal Medicine

## 2015-04-14 ENCOUNTER — Ambulatory Visit (INDEPENDENT_AMBULATORY_CARE_PROVIDER_SITE_OTHER): Payer: Medicare Other | Admitting: Internal Medicine

## 2015-06-01 ENCOUNTER — Emergency Department (HOSPITAL_COMMUNITY): Payer: Medicare Other

## 2015-06-01 ENCOUNTER — Emergency Department (HOSPITAL_COMMUNITY)
Admission: EM | Admit: 2015-06-01 | Discharge: 2015-06-02 | Disposition: A | Discharge status: Home / Self Care | Payer: Medicare Other | Attending: Psychiatry | Admitting: Psychiatry

## 2015-06-01 ENCOUNTER — Encounter (HOSPITAL_COMMUNITY): Payer: Self-pay | Admitting: *Deleted

## 2015-06-01 DIAGNOSIS — S91104A Unspecified open wound of right lesser toe(s) without damage to nail, initial encounter: Secondary | ICD-10-CM | POA: Insufficient documentation

## 2015-06-01 DIAGNOSIS — Y998 Other external cause status: Secondary | ICD-10-CM | POA: Diagnosis not present

## 2015-06-01 DIAGNOSIS — Z8719 Personal history of other diseases of the digestive system: Secondary | ICD-10-CM | POA: Insufficient documentation

## 2015-06-01 DIAGNOSIS — Y9289 Other specified places as the place of occurrence of the external cause: Secondary | ICD-10-CM | POA: Diagnosis not present

## 2015-06-01 DIAGNOSIS — Z8669 Personal history of other diseases of the nervous system and sense organs: Secondary | ICD-10-CM | POA: Insufficient documentation

## 2015-06-01 DIAGNOSIS — Z23 Encounter for immunization: Secondary | ICD-10-CM | POA: Diagnosis not present

## 2015-06-01 DIAGNOSIS — F1110 Opioid abuse, uncomplicated: Secondary | ICD-10-CM | POA: Insufficient documentation

## 2015-06-01 DIAGNOSIS — F10120 Alcohol abuse with intoxication, uncomplicated: Secondary | ICD-10-CM | POA: Insufficient documentation

## 2015-06-01 DIAGNOSIS — Z87440 Personal history of urinary (tract) infections: Secondary | ICD-10-CM | POA: Diagnosis not present

## 2015-06-01 DIAGNOSIS — I10 Essential (primary) hypertension: Secondary | ICD-10-CM | POA: Insufficient documentation

## 2015-06-01 DIAGNOSIS — Z9861 Coronary angioplasty status: Secondary | ICD-10-CM | POA: Diagnosis not present

## 2015-06-01 DIAGNOSIS — Z8619 Personal history of other infectious and parasitic diseases: Secondary | ICD-10-CM | POA: Diagnosis not present

## 2015-06-01 DIAGNOSIS — Y9389 Activity, other specified: Secondary | ICD-10-CM | POA: Insufficient documentation

## 2015-06-01 DIAGNOSIS — F17210 Nicotine dependence, cigarettes, uncomplicated: Secondary | ICD-10-CM | POA: Insufficient documentation

## 2015-06-01 DIAGNOSIS — J441 Chronic obstructive pulmonary disease with (acute) exacerbation: Secondary | ICD-10-CM | POA: Insufficient documentation

## 2015-06-01 DIAGNOSIS — Z8781 Personal history of (healed) traumatic fracture: Secondary | ICD-10-CM | POA: Insufficient documentation

## 2015-06-01 DIAGNOSIS — F10920 Alcohol use, unspecified with intoxication, uncomplicated: Secondary | ICD-10-CM

## 2015-06-01 DIAGNOSIS — Z79899 Other long term (current) drug therapy: Secondary | ICD-10-CM | POA: Diagnosis not present

## 2015-06-01 DIAGNOSIS — W25XXXA Contact with sharp glass, initial encounter: Secondary | ICD-10-CM | POA: Diagnosis not present

## 2015-06-01 DIAGNOSIS — R45851 Suicidal ideations: Secondary | ICD-10-CM

## 2015-06-01 DIAGNOSIS — Z008 Encounter for other general examination: Secondary | ICD-10-CM | POA: Diagnosis present

## 2015-06-01 HISTORY — DX: Depression, unspecified: F32.A

## 2015-06-01 HISTORY — DX: Major depressive disorder, single episode, unspecified: F32.9

## 2015-06-01 LAB — CBC WITH DIFFERENTIAL/PLATELET
BASOS ABS: 0.1 10*3/uL (ref 0.0–0.1)
BASOS PCT: 1 %
EOS PCT: 4 %
Eosinophils Absolute: 0.2 10*3/uL (ref 0.0–0.7)
HCT: 41.3 % (ref 39.0–52.0)
Hemoglobin: 14.4 g/dL (ref 13.0–17.0)
Lymphocytes Relative: 42 %
Lymphs Abs: 2.1 10*3/uL (ref 0.7–4.0)
MCH: 35.5 pg — ABNORMAL HIGH (ref 26.0–34.0)
MCHC: 34.9 g/dL (ref 30.0–36.0)
MCV: 101.7 fL — ABNORMAL HIGH (ref 78.0–100.0)
MONO ABS: 0.6 10*3/uL (ref 0.1–1.0)
Monocytes Relative: 12 %
NEUTROS ABS: 2 10*3/uL (ref 1.7–7.7)
Neutrophils Relative %: 41 %
Platelets: 93 10*3/uL — ABNORMAL LOW (ref 150–400)
RBC: 4.06 MIL/uL — AB (ref 4.22–5.81)
RDW: 15.1 % (ref 11.5–15.5)
Smear Review: DECREASED
WBC: 4.9 10*3/uL (ref 4.0–10.5)

## 2015-06-01 LAB — COMPREHENSIVE METABOLIC PANEL
ALBUMIN: 3.7 g/dL (ref 3.5–5.0)
ALT: 41 U/L (ref 17–63)
ANION GAP: 9 (ref 5–15)
AST: 89 U/L — AB (ref 15–41)
Alkaline Phosphatase: 72 U/L (ref 38–126)
BUN: 8 mg/dL (ref 6–20)
CO2: 26 mmol/L (ref 22–32)
Calcium: 8.3 mg/dL — ABNORMAL LOW (ref 8.9–10.3)
Chloride: 109 mmol/L (ref 101–111)
Creatinine, Ser: 0.91 mg/dL (ref 0.61–1.24)
GFR calc Af Amer: >60 mL/min (ref >60)
GFR calc non Af Amer: >60 mL/min (ref >60)
GLUCOSE: 95 mg/dL (ref 65–99)
POTASSIUM: 3.7 mmol/L (ref 3.5–5.1)
SODIUM: 144 mmol/L (ref 135–145)
Total Bilirubin: 0.8 mg/dL (ref 0.3–1.2)
Total Protein: 7.3 g/dL (ref 6.5–8.1)

## 2015-06-01 LAB — SALICYLATE LEVEL: Salicylate Lvl: <4 mg/dL (ref 2.8–30.0)

## 2015-06-01 LAB — RAPID URINE DRUG SCREEN, HOSP PERFORMED
AMPHETAMINES: NOT DETECTED
BARBITURATES: NOT DETECTED
BENZODIAZEPINES: NOT DETECTED
Cocaine: POSITIVE — AB
Opiates: NOT DETECTED
Tetrahydrocannabinol: NOT DETECTED

## 2015-06-01 LAB — ETHANOL: ALCOHOL ETHYL (B): 379 mg/dL — AB (ref <5)

## 2015-06-01 LAB — ACETAMINOPHEN LEVEL: Acetaminophen (Tylenol), Serum: <10 ug/mL — ABNORMAL LOW (ref 10–30)

## 2015-06-01 MED ORDER — DIPHENHYDRAMINE HCL 25 MG PO CAPS
25.0000 mg | ORAL_CAPSULE | Freq: Once | ORAL | Status: AC
  Administered 2015-06-02: 25 mg via ORAL
  Filled 2015-06-01: qty 1

## 2015-06-01 MED ORDER — TETANUS-DIPHTH-ACELL PERTUSSIS 5-2.5-18.5 LF-MCG/0.5 IM SUSP
0.5000 mL | Freq: Once | INTRAMUSCULAR | Status: AC
  Administered 2015-06-01: 0.5 mL via INTRAMUSCULAR
  Filled 2015-06-01: qty 0.5

## 2015-06-01 NOTE — ED Notes (Signed)
Pt has mentioned several times he just want something to go to sleep & die. Pt states he does not know how to do it. He just wants to die.

## 2015-06-01 NOTE — ED Notes (Addendum)
Patient has a laceration to pinky toe, skin peeled back but still attached. Cleaned site with normal saline, applied wet gauze to site until EDP can see patient. Patient tearful stating that he has not slept in several days. States that he wants something to help him sleep so he can die.

## 2015-06-01 NOTE — ED Notes (Signed)
CRITICAL VALUE ALERT  Critical value received:  Alcohol 379  Date of notification:  06/01/2015  Time of notification:  23:00  Critical value read back: yes  Nurse who received alert:  Juliette AlcideMelinda RN  MD notified (1st page):  Dr Wilkie AyeHorton  Time of first page:  23:00  MD notified (2nd page):  Time of second page:  Responding MD:  Dr Wilkie AyeHorton  Time MD responded:  23:00

## 2015-06-01 NOTE — ED Provider Notes (Signed)
CSN: [id number]     Arrival date & time 06/01/15  2147 History  By signing my name below, I, Lyndel SafeKaitlyn Shelton, attest that this documentation has been prepared under the direction and in the presence of Shon Batonourtney F Auria Mckinlay, MD. Electronically Signed: Lyndel SafeKaitlyn Shelton, ED Scribe. 06/01/2015. 11:37 PM.   Chief Complaint  Patient presents with  . V70.1    The history is provided by the EMS personnel and the patient. No language interpreter was used.   HPI Comments: Roberto [name] is a 69 y.o. male, with a PMhx of COPD, HTN, duodenal ulcer, cirrhosis and a hx of substance abuse, who was brought into the Emergency Department by ambulance for evaluation of a laceration to his left, 5th toe that he sustained tonight when he dropped a beer bottle in his bathroom. Tetanus not UTD. Per EMS, the pt was found to be intoxicated on arrival and there was glass on the floor surrounding the pt. He endorses heavy EtOH consumption over the past 2 weeks, drinking a 12 pack of beer a day. He is requesting detox from EtOH on this visit. The pt states he does not want to be a burden on his family any longer but he does not have a suicidal plan. He has attended rehab in the past for crack cocaine abuse but denies any illicit drug use in the past 3 days. Pt also c/o SOB and CP that he attributes to his known duodenal ulcer. He is also stating he 'just wants something to help him sleep'. Denies cough or fever.   Past Medical History  Diagnosis Date  . COPD (chronic obstructive pulmonary disease) (HCC)   . Hypertension   . Hepatic steatosis 12/14/2011  . Compression fracture of L1 lumbar vertebra (HCC) 12/14/2011  . C2 cervical fracture (HCC)   . Duodenal ulcer with hemorrhage 12/15/2011    Likely source of bleeding.per EGD; Dr. Karilyn Cotaehman  . Esophageal varices without bleeding (HCC) 12/15/2011    per EGD  . Cirrhosis (HCC) 12/16/2011    per ultrasound  . Hx of substance abuse 12/17/2011  . Alcohol dependence with alcohol-induced  persisting dementia (HCC) 12/18/2011  . Hepatic encephalopathy (HCC)   . Helicobacter pylori antibody positive 12/19/2011  . Encephalopathy 05/18/2012  . UTI (urinary tract infection) 12/13/2011  . Ascites 05/18/2012    S/p paracentesis yielding 7880 mL  . Cirrhosis [facility](HCC)    Past Surgical History  Procedure Laterality Date  . Coronary angioplasty with stent placement     History reviewed. No pertinent family history. Social History  Substance Use Topics  . Smoking status: Current Every Day Smoker -- 1.00 packs/day    Types: Cigarettes  . Smokeless tobacco: Current User     Comment: 1 1/2 pack a day  . Alcohol Use: Yes     Comment: drinking everyday.    Review of Systems  Constitutional: Negative.  Negative for fever.  Respiratory: Positive for shortness of breath. Negative for chest tightness.   Cardiovascular: Negative.  Negative for chest pain.  Gastrointestinal: Negative.   Genitourinary: Negative.  Negative for dysuria.  Skin: Positive for wound.  Neurological: Negative for headaches.  Psychiatric/Behavioral: Positive for sleep disturbance and dysphoric mood.  All other systems reviewed and are negative.  Allergies  Review of patient's allergies indicates no known allergies.  Home Medications   Prior to Admission medications   Medication Sig Start Date End Date Taking? Authorizing Provider  albuterol (PROVENTIL) (2.5 MG/3ML) 0.083% nebulizer solution Take 2.5 mg by nebulization  every 6 (six) hours as needed for wheezing or shortness of breath.   Yes Historical Provider, MD  omeprazole (PRILOSEC) 20 MG capsule Take 20 mg by mouth daily. 05/24/15  Yes Historical Provider, MD  cephALEXin (KEFLEX) 500 MG capsule Take 1 capsule (500 mg total) by mouth 4 (four) times daily. Patient not taking: Reported on 06/01/2015 01/25/15   Burgess Amor, PA-C   BP 138/95 mmHg  Pulse 69  Temp(Src) 97.7 F (36.5 C) (Oral)  Resp 20  Ht  (1.727 m)  Wt 200 lb (90.719 kg)  BMI 30.42  kg/m2  SpO2 95% Physical Exam  Constitutional: He is oriented to person, place, and time. No distress.  Disheveled, intoxicated  HENT:  Head: Normocephalic and atraumatic.  Cardiovascular: Normal rate, regular rhythm and normal heart sounds.   No murmur heard. Pulmonary/Chest: Effort normal. No respiratory distress. He has wheezes. He exhibits no tenderness.  Abdominal: Soft. Bowel sounds are normal. There is no tenderness. There is no rebound.  Musculoskeletal: He exhibits no edema.  Neurological: He is alert and oriented to person, place, and time.  Skin: Skin is warm and dry.  Avulsion injury over the dorsum of the right fifth toe, bleeding controlled  Psychiatric:  Intoxicated, at times tearful  Nursing note and vitals reviewed.   ED Course  Procedures  DIAGNOSTIC STUDIES: Oxygen Saturation is 95% on RA, adequate by my interpretation.    COORDINATION OF CARE: 11:07 PM Discussed treatment plan with pt at bedside and pt agreed to plan.  Labs Review Labs Reviewed  URINE RAPID DRUG SCREEN, HOSP PERFORMED - Abnormal; Notable for the following:    Cocaine POSITIVE (*)    All other components within normal limits  ETHANOL - Abnormal; Notable for the following:    Alcohol, Ethyl (B) 379 (*)    All other components within normal limits  CBC WITH DIFFERENTIAL/PLATELET - Abnormal; Notable for the following:    RBC 4.06 (*)    MCV 101.7 (*)    MCH 35.5 (*)    Platelets 93 (*)    All other components within normal limits  COMPREHENSIVE METABOLIC PANEL - Abnormal; Notable for the following:    Calcium 8.3 (*)    AST 89 (*)    All other components within normal limits  ACETAMINOPHEN LEVEL - Abnormal; Notable for the following:    Acetaminophen (Tylenol), Serum <10 (*)    All other components within normal limits  SALICYLATE LEVEL  TROPONIN I    Imaging Review Dg Chest 2 View  06/02/2015  CLINICAL DATA:  COPD exacerbation with shortness of breath. Current smoker. EXAM:  CHEST  2 VIEW COMPARISON:  12/04/2014 and earlier, including CTA chest 07/23/2014. FINDINGS: AP erect and lateral images were obtained. Cardiac silhouette normal in size. LAD coronary artery stent. Thoracic aorta mildly atherosclerotic, unchanged. Hilar and mediastinal contours otherwise unremarkable. Hyperinflation and emphysematous changes throughout both lungs, unchanged. Scarring in the left lower lobe, unchanged. No new pulmonary parenchymal abnormalities. No pleural effusions. No pneumothorax. Visualized bony thorax intact. IMPRESSION: COPD/emphysema. Scarring in the left lower lobe. No acute cardiopulmonary disease. Electronically Signed   By: Hulan Saas M.D.   On: 06/02/2015 00:04   Dg Foot Complete Right  06/01/2015  CLINICAL DATA:  69 year old who admits to excessive alcohol consumption presenting with a laceration to the right 5th toe which the patient cannot remember how it occurred. Glass on the patient's motel room floor at the time of EMS arrival. Initial encounter. EXAM: RIGHT FOOT  COMPLETE - 3+ VIEW COMPARISON:  None. FINDINGS: Examination was performed in bandage material. No visible opaque foreign body to suggest a glass shard. No evidence of acute fracture or dislocation. Narrowing of the IP joint spaces of the toes, greatest in the DIP joints of the 2nd through 5th toes. Mild spurring involving the head of the 1st metatarsal. IMPRESSION: 1. No acute osseous abnormality. 2. No visible opaque foreign bodies. 3. Mild osteoarthritis. Electronically Signed   By: Hulan Saas M.D.   On: 06/01/2015 22:40   I have personally reviewed and evaluated these images and lab results as part of my medical decision-making.   EKG Interpretation   Date/Time:  Tuesday June 01 2015 23:41:24 EDT Ventricular Rate:  75 PR Interval:  170 QRS Duration: 86 QT Interval:  430 QTC Calculation: 480 R Axis:   2 Text Interpretation:  Normal sinus rhythm Prolonged QT Abnormal ECG  Confirmed by  Davi Rotan  MD, Kymberlee Viger ([id number]) on 06/02/2015 1:04:56 AM      MDM   Final diagnoses:  None    Patient with alcoholic intoxication and passive SI. Otherwise nontoxic. TTS unable to assess the patient given acute intoxication. Will attempt assessment when patient is more clinically sober.  5:34 AM Patient is medically clear. He is awake. He states that he can speak to TTS at this time.  I personally performed the services described in this documentation, which was scribed in my presence. The recorded information has been reviewed and is accurate.    Shon Baton, MD 06/02/15 [phone]

## 2015-06-01 NOTE — ED Notes (Addendum)
Pt arrived by EMS from motel he is staying at. Per ems glass on the floor, pt admits to having drank 3 ot 4 40 oz beers. Pt does not remember injuring himself. Pt states wanting some help w/ ETOH.

## 2015-06-01 NOTE — ED Notes (Signed)
Dr Horton at bedside,  

## 2015-06-02 ENCOUNTER — Inpatient Hospital Stay (HOSPITAL_COMMUNITY)
Admission: AD | Admit: 2015-06-02 | Discharge: 2015-06-10 | DRG: 897 | Disposition: A | Discharge status: Home / Self Care | Payer: Medicare Other | Source: Intra-hospital | Attending: Psychiatry | Admitting: Psychiatry

## 2015-06-02 ENCOUNTER — Encounter (HOSPITAL_COMMUNITY): Payer: Self-pay

## 2015-06-02 ENCOUNTER — Encounter (HOSPITAL_COMMUNITY): Payer: Self-pay | Admitting: *Deleted

## 2015-06-02 DIAGNOSIS — J449 Chronic obstructive pulmonary disease, unspecified: Secondary | ICD-10-CM | POA: Diagnosis present

## 2015-06-02 DIAGNOSIS — F10230 Alcohol dependence with withdrawal, uncomplicated: Secondary | ICD-10-CM | POA: Insufficient documentation

## 2015-06-02 DIAGNOSIS — G4700 Insomnia, unspecified: Secondary | ICD-10-CM | POA: Diagnosis present

## 2015-06-02 DIAGNOSIS — I10 Essential (primary) hypertension: Secondary | ICD-10-CM | POA: Diagnosis present

## 2015-06-02 DIAGNOSIS — F10120 Alcohol abuse with intoxication, uncomplicated: Secondary | ICD-10-CM | POA: Diagnosis not present

## 2015-06-02 DIAGNOSIS — F10929 Alcohol use, unspecified with intoxication, unspecified: Secondary | ICD-10-CM | POA: Diagnosis present

## 2015-06-02 DIAGNOSIS — F1020 Alcohol dependence, uncomplicated: Secondary | ICD-10-CM | POA: Diagnosis present

## 2015-06-02 DIAGNOSIS — F1024 Alcohol dependence with alcohol-induced mood disorder: Secondary | ICD-10-CM | POA: Diagnosis present

## 2015-06-02 DIAGNOSIS — Y908 Blood alcohol level of 240 mg/100 ml or more: Secondary | ICD-10-CM | POA: Diagnosis present

## 2015-06-02 DIAGNOSIS — F1094 Alcohol use, unspecified with alcohol-induced mood disorder: Secondary | ICD-10-CM

## 2015-06-02 DIAGNOSIS — F419 Anxiety disorder, unspecified: Secondary | ICD-10-CM | POA: Diagnosis present

## 2015-06-02 DIAGNOSIS — F10129 Alcohol abuse with intoxication, unspecified: Secondary | ICD-10-CM | POA: Diagnosis not present

## 2015-06-02 DIAGNOSIS — K760 Fatty (change of) liver, not elsewhere classified: Secondary | ICD-10-CM | POA: Diagnosis present

## 2015-06-02 DIAGNOSIS — F17210 Nicotine dependence, cigarettes, uncomplicated: Secondary | ICD-10-CM | POA: Diagnosis present

## 2015-06-02 DIAGNOSIS — F1014 Alcohol abuse with alcohol-induced mood disorder: Secondary | ICD-10-CM | POA: Diagnosis not present

## 2015-06-02 DIAGNOSIS — K219 Gastro-esophageal reflux disease without esophagitis: Secondary | ICD-10-CM | POA: Diagnosis present

## 2015-06-02 LAB — TROPONIN I

## 2015-06-02 MED ORDER — ONDANSETRON 4 MG PO TBDP: 4.0000 mg | ORAL_TABLET | Freq: Four times a day (QID) | ORAL | Status: AC | PRN

## 2015-06-02 MED ORDER — TRAZODONE HCL 50 MG PO TABS
50.0000 mg | ORAL_TABLET | Freq: Every evening | ORAL | Status: DC | PRN
  Administered 2015-06-02 – 2015-06-03 (×2): 50 mg via ORAL
  Filled 2015-06-02 (×2): qty 1

## 2015-06-02 MED ORDER — LORAZEPAM 2 MG/ML IJ SOLN: 0.0000 mg | Freq: Two times a day (BID) | INTRAMUSCULAR | Status: DC

## 2015-06-02 MED ORDER — LOPERAMIDE HCL 2 MG PO CAPS: 2.0000 mg | ORAL_CAPSULE | ORAL | Status: AC | PRN

## 2015-06-02 MED ORDER — VITAMIN B-1 100 MG PO TABS
100.0000 mg | ORAL_TABLET | Freq: Every day | ORAL | Status: DC
  Administered 2015-06-03 – 2015-06-10 (×8): 100 mg via ORAL
  Filled 2015-06-02 (×10): qty 1

## 2015-06-02 MED ORDER — PNEUMOCOCCAL VAC POLYVALENT 25 MCG/0.5ML IJ INJ: 0.5000 mL | INJECTION | INTRAMUSCULAR | Status: DC

## 2015-06-02 MED ORDER — THIAMINE HCL 100 MG/ML IJ SOLN
100.0000 mg | Freq: Once | INTRAMUSCULAR | Status: AC
  Administered 2015-06-02: 100 mg via INTRAMUSCULAR
  Filled 2015-06-02: qty 2

## 2015-06-02 MED ORDER — LORAZEPAM 1 MG PO TABS
1.0000 mg | ORAL_TABLET | Freq: Every day | ORAL | Status: AC
  Administered 2015-06-06: 1 mg via ORAL
  Filled 2015-06-02: qty 1

## 2015-06-02 MED ORDER — LORAZEPAM 1 MG PO TABS
1.0000 mg | ORAL_TABLET | Freq: Three times a day (TID) | ORAL | Status: AC
  Administered 2015-06-04 (×3): 1 mg via ORAL
  Filled 2015-06-02 (×3): qty 1

## 2015-06-02 MED ORDER — ALBUTEROL SULFATE (2.5 MG/3ML) 0.083% IN NEBU: 3.0000 mL | INHALATION_SOLUTION | Freq: Four times a day (QID) | RESPIRATORY_TRACT | Status: DC | PRN

## 2015-06-02 MED ORDER — LORAZEPAM 1 MG PO TABS
1.0000 mg | ORAL_TABLET | Freq: Four times a day (QID) | ORAL | Status: AC
  Administered 2015-06-02 – 2015-06-03 (×6): 1 mg via ORAL
  Filled 2015-06-02 (×6): qty 1

## 2015-06-02 MED ORDER — ADULT MULTIVITAMIN W/MINERALS CH
1.0000 | ORAL_TABLET | Freq: Every day | ORAL | Status: DC
  Administered 2015-06-02 – 2015-06-10 (×9): 1 via ORAL
  Filled 2015-06-02 (×12): qty 1

## 2015-06-02 MED ORDER — LORAZEPAM 1 MG PO TABS
0.0000 mg | ORAL_TABLET | Freq: Two times a day (BID) | ORAL | Status: DC
  Filled 2015-06-02: qty 1

## 2015-06-02 MED ORDER — ALUM & MAG HYDROXIDE-SIMETH 200-200-20 MG/5ML PO SUSP
30.0000 mL | ORAL | Status: DC | PRN
  Administered 2015-06-04 – 2015-06-08 (×3): 30 mL via ORAL
  Filled 2015-06-02 (×3): qty 30

## 2015-06-02 MED ORDER — ALBUTEROL SULFATE (2.5 MG/3ML) 0.083% IN NEBU: 2.5000 mg | INHALATION_SOLUTION | Freq: Four times a day (QID) | RESPIRATORY_TRACT | Status: DC | PRN

## 2015-06-02 MED ORDER — ACETAMINOPHEN 325 MG PO TABS: 650.0000 mg | ORAL_TABLET | Freq: Four times a day (QID) | ORAL | Status: DC | PRN

## 2015-06-02 MED ORDER — HYDROXYZINE HCL 25 MG PO TABS
25.0000 mg | ORAL_TABLET | Freq: Four times a day (QID) | ORAL | Status: AC | PRN
  Administered 2015-06-02 – 2015-06-05 (×5): 25 mg via ORAL
  Filled 2015-06-02 (×7): qty 1

## 2015-06-02 MED ORDER — LORAZEPAM 1 MG PO TABS
1.0000 mg | ORAL_TABLET | Freq: Four times a day (QID) | ORAL | Status: AC | PRN
  Administered 2015-06-03 – 2015-06-04 (×2): 1 mg via ORAL
  Filled 2015-06-02 (×2): qty 1

## 2015-06-02 MED ORDER — LORAZEPAM 2 MG/ML IJ SOLN: 0.0000 mg | Freq: Four times a day (QID) | INTRAMUSCULAR | Status: DC

## 2015-06-02 MED ORDER — VITAMIN B-1 100 MG PO TABS
100.0000 mg | ORAL_TABLET | Freq: Every day | ORAL | Status: DC
  Administered 2015-06-02: 100 mg via ORAL
  Filled 2015-06-02: qty 1

## 2015-06-02 MED ORDER — PANTOPRAZOLE SODIUM 40 MG PO TBEC
40.0000 mg | DELAYED_RELEASE_TABLET | Freq: Every day | ORAL | Status: DC
  Administered 2015-06-02: 40 mg via ORAL
  Filled 2015-06-02: qty 1

## 2015-06-02 MED ORDER — PANTOPRAZOLE SODIUM 40 MG PO TBEC
40.0000 mg | DELAYED_RELEASE_TABLET | Freq: Every day | ORAL | Status: DC
  Administered 2015-06-03 – 2015-06-10 (×8): 40 mg via ORAL
  Filled 2015-06-02 (×11): qty 1

## 2015-06-02 MED ORDER — LORAZEPAM 1 MG PO TABS
1.0000 mg | ORAL_TABLET | Freq: Two times a day (BID) | ORAL | Status: AC
  Administered 2015-06-05 (×2): 1 mg via ORAL
  Filled 2015-06-02 (×2): qty 1

## 2015-06-02 MED ORDER — THIAMINE HCL 100 MG/ML IJ SOLN: 100.0000 mg | Freq: Every day | INTRAMUSCULAR | Status: DC

## 2015-06-02 MED ORDER — LORAZEPAM 1 MG PO TABS
0.0000 mg | ORAL_TABLET | Freq: Four times a day (QID) | ORAL | Status: DC
  Administered 2015-06-02 (×2): 1 mg via ORAL
  Filled 2015-06-02: qty 1

## 2015-06-02 MED ORDER — NYSTATIN 100000 UNIT/GM EX POWD
Freq: Three times a day (TID) | CUTANEOUS | Status: DC
  Filled 2015-06-02: qty 15

## 2015-06-02 MED ORDER — HYDROXYZINE HCL 25 MG PO TABS: 25.0000 mg | ORAL_TABLET | Freq: Four times a day (QID) | ORAL | Status: DC | PRN

## 2015-06-02 MED ORDER — NYSTATIN 100000 UNIT/GM EX POWD
Freq: Three times a day (TID) | CUTANEOUS | Status: DC
  Administered 2015-06-02 – 2015-06-08 (×14): via TOPICAL
  Filled 2015-06-02 (×2): qty 15

## 2015-06-02 MED ORDER — LORAZEPAM 1 MG PO TABS
1.0000 mg | ORAL_TABLET | Freq: Once | ORAL | Status: AC
  Administered 2015-06-02: 1 mg via ORAL
  Filled 2015-06-02: qty 1

## 2015-06-02 MED ORDER — INFLUENZA VAC SPLIT QUAD 0.5 ML IM SUSY
0.5000 mL | PREFILLED_SYRINGE | INTRAMUSCULAR | Status: DC
  Filled 2015-06-02: qty 0.5

## 2015-06-02 MED ORDER — MAGNESIUM HYDROXIDE 400 MG/5ML PO SUSP: 30.0000 mL | Freq: Every day | ORAL | Status: DC | PRN

## 2015-06-02 MED ORDER — ALBUTEROL SULFATE HFA 108 (90 BASE) MCG/ACT IN AERS
1.0000 | INHALATION_SPRAY | Freq: Four times a day (QID) | RESPIRATORY_TRACT | Status: DC | PRN
  Administered 2015-06-02: 1 via RESPIRATORY_TRACT
  Administered 2015-06-02 – 2015-06-08 (×10): 2 via RESPIRATORY_TRACT
  Administered 2015-06-09: 1 via RESPIRATORY_TRACT
  Administered 2015-06-10: 2 via RESPIRATORY_TRACT
  Filled 2015-06-02: qty 6.7

## 2015-06-02 NOTE — Progress Notes (Signed)
Pt did not attend the evening NA speaker meeting. Pt had just arrived to the unit earlier today and was in his room resting.

## 2015-06-02 NOTE — ED Notes (Signed)
Pt gave permission to speak with his son Elnoria HowardCraig Chiappetta,  Son is concerned that pt has become depressed and is medicating himself with alcohol, would like to speak with Behavioral Health counselors if possible,  Contact information  Elnoria HowardCraig Fare  [phone], may leave message if needed,

## 2015-06-02 NOTE — Progress Notes (Addendum)
Patient ID: Roberto [name], male   DOB: [birth year], 69 y.o.   MRN: [id number]  Initial Interdisciplinary Treatment Plan   PATIENT STRESSORS: Financial difficulties Health problems Medication change or noncompliance Substance abuse   PATIENT STRENGTHS: Active sense of humor Communication skills Financial means General fund of knowledge Physical Health Supportive family/friends   PROBLEM LIST: Problem List/Patient Goals Date to be addressed Date deferred Reason deferred Estimated date of resolution  "I haven't been sleeping, I'll wake up and drink and try to go back to bed" 06/02/2015      "I drink 4 40's or 12 pack and 60$ worth of crack" 06/02/2015     "My dentures don't fit, I think I need better glue" 06/02/2015     "My mom is really sick right now" 06/02/2015                                    DISCHARGE CRITERIA:  Improved stabilization in mood, thinking, and/or behavior Motivation to continue treatment in a less acute level of care Safe-care adequate arrangements made Withdrawal symptoms are absent or subacute and managed without 24-hour nursing intervention  PRELIMINARY DISCHARGE PLAN: Outpatient therapy Placement in alternative living arrangements  PATIENT/FAMIILY INVOLVEMENT: This treatment plan has been presented to and reviewed with the patient, Roberto [name]. The patient and family have been given the opportunity to ask questions and make suggestions.  Aurora Maskwyman, Taisei Bonnette E 06/02/2015, 6:53 PM

## 2015-06-02 NOTE — Progress Notes (Signed)
Patient ID: Roberto [name], male   DOB: 1945/08/22, 69 y.o.   MRN: [id number] PER STATE REGULATIONS 482.30  THIS CHART WAS REVIEWED FOR MEDICAL NECESSITY WITH RESPECT TO THE PATIENT'S ADMISSION/DURATION OF STAY.  NEXT REVIEW DATE:06/06/15  Loura HaltBARBARA Ki Luckman, RN, BSN CASE MANAGER

## 2015-06-02 NOTE — ED Provider Notes (Signed)
TTS has evaluated pt: inpt treatment recommended. [facility] Will transfer stable.   Samuel JesterKathleen Bode Pieper, DO 06/02/15 1048

## 2015-06-02 NOTE — BH Assessment (Signed)
Tele Assessment Note   Roberto [name] is a 69 y.o. male who voluntarily presents to [facility] for alcohol/crack detox.  Pt is c/o SI, no specific plan to harm himself--"I don't care if I live or die".  Pt was brought in by EMS after he sustained a laceration to to left foot when he dropped a beer bottle in his bathroom.  Pt was intoxicated upon arrival and endorses heavy alcohol use--he drinks at least a 12pk of beer, daily and his last drink was yesterday and he drank a 12pk of beer.  Pt also smokes $60 worth of crack, weekly and he last used crack on 05/28/15--"I was cooking it up in my room".  Pt denies seizures/blackouts. Pt is c/o w/d--racing heart, numbness in hands/fingers and leg cramps.  Pt is tearful during interview and states that he chronic liver problems because of his drinking.  Pt denies HI/AVH.  This Clinical research associate discussed the disposition with Donell Sievert, PA who recommends inpt admission.  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 COPD (chronic obstructive pulmonary disease) (HCC)   . Hypertension   . Hepatic steatosis 12/14/2011  . Compression fracture of L1 lumbar vertebra (HCC) 12/14/2011  . C2 cervical fracture (HCC)   . Duodenal ulcer with hemorrhage 12/15/2011    Likely source of bleeding.per EGD; Dr. Karilyn Cota  . Esophageal varices without bleeding (HCC) 12/15/2011    per EGD  . Cirrhosis (HCC) 12/16/2011    per ultrasound  . Hx of substance abuse 12/17/2011  . 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 Hepatic encephalopathy (HCC)   . Helicobacter pylori antibody positive 12/19/2011  . Encephalopathy 05/18/2012  . UTI (urinary tract infection) 12/13/2011  . Ascites 05/18/2012    S/p paracentesis yielding 7880 mL  . Cirrhosis (HCC)   . Depression      Past Surgical History  Procedure Laterality Date  . Coronary angioplasty with stent placement      Family History: History reviewed. No pertinent family history.  Social History:  reports that he has been smoking Cigarettes.  He has been smoking about 1.00 pack per day. He uses smokeless tobacco. He reports that he drinks alcohol. He reports that he uses illicit drugs (Cocaine).  Additional Social History:  Alcohol / Drug Use Pain Medications: See MAR  Prescriptions: See MAR  Over the Counter: See MAR  History of alcohol / drug use?: Yes Longest period of sobriety (when/how long): None  Negative Consequences of Use: Work / Programmer, multimedia, Copywriter, advertising relationships, Surveyor, quantity Withdrawal Symptoms: Other (Comment) (Leg cramps, racing heart, numbness in fingers ) Substance #1 Name of Substance 1: Alcohol  1 - Age of First Use: 16 YOM  1 - Amount (size/oz): 12 PK 1 - Frequency: Daily  1 - Duration: On-going  1 - Last Use / Amount: 06/01/15 Substance #2 Name of Substance 2: Crack  2 - Age of First Use: 40's  2 - Amount (size/oz): $60 2 - Frequency: Wkly  2 - Duration: On-going  2 - Last Use / Amount: 1 Wk Ago   CIWA: CIWA-Ar BP: 96/55 mmHg Pulse Rate: 88 COWS:    PATIENT STRENGTHS: (choose at least two) Motivation for treatment/growth  Allergies: No Known Allergies  Home Medications:  (Not in a hospital admission)  OB/GYN Status:  No LMP for male patient.  General Assessment Data [facility] of Assessment: [facility] TTS Assessment: In system Is this a Tele or Face-to-Face Assessment?: Tele Assessment Is this an Initial Assessment or a Re-assessment for this encounter?: Initial Assessment Marital status: Single Maiden name: None  Is patient pregnant?: No Pregnancy Status: No Living Arrangements: Alone Can pt return to current living arrangement?: Yes Admission Status: Voluntary Is patient capable of signing voluntary admission?: Yes Referral Source: MD Insurance type:  MCR  Medical Screening Exam [facility] Walk-in ONLY) Medical Exam completed: No Reason for MSE not completed: Other: (None )  Crisis Care Plan Living Arrangements: Alone Name of Psychiatrist: None  Name of Therapist: None   Education Status Is patient currently in school?: No Current Grade: None  Highest grade of school patient has completed: None  Name of school: None  Contact person: None   Risk to self with the past 6 months Suicidal Ideation: Yes-Currently Present Has patient been a risk to self within the past 6 months prior to admission? : Yes Suicidal Intent: No Has patient had any suicidal intent within the past 6 months prior to admission? : No Is patient at risk for suicide?: Yes Suicidal Plan?: No Has patient had any suicidal plan within the past 6 months prior to admission? : No Access to Means: Yes Specify Access to Suicidal Means: Sharps, Pills  What has been your use of drugs/alcohol within the last 12 months?: Abusing: alcohol, crack  Previous Attempts/Gestures: No How many times?: 0 Other Self Harm Risks: None  Triggers for Past Attempts: None known Intentional Self Injurious Behavior: None Family Suicide History: No Recent stressful life event(s): Other (Comment) (Chronic SA ) Persecutory voices/beliefs?: Yes Depression: Yes Depression Symptoms: Tearfulness, Loss of interest in usual pleasures, Feeling worthless/self pity, Isolating, Fatigue, Guilt Substance abuse history and/or treatment for substance abuse?: Yes Suicide prevention information given to non-admitted patients: Not applicable  Risk to Others within the past 6 months Homicidal Ideation: No Does patient have any lifetime risk of violence toward others beyond the six months prior to admission? : No Thoughts of Harm to Others: No Current Homicidal Intent: No Current Homicidal Plan: No Access to Homicidal Means: No Identified Victim: None  History of harm to others?: No Assessment of Violence:  None Noted Violent Behavior Description: None  Does patient have access to weapons?: No Criminal Charges Pending?: No Does patient have a court date: No Is patient on probation?: No  Psychosis Hallucinations: None noted Delusions: None noted  Mental Status Report Appearance/Hygiene: In scrubs, Disheveled Eye Contact: Fair Motor Activity: Unremarkable Speech: Logical/coherent, Slow, Slurred Level of Consciousness: Alert, Crying Mood: Depressed, Sad Affect: Depressed, Sad, Flat Anxiety Level: None Thought Processes: Coherent, Relevant Judgement: Impaired Orientation: Person, Place, Time, Situation Obsessive Compulsive Thoughts/Behaviors: None  Cognitive Functioning Concentration: Normal Memory: Recent Intact, Remote Intact IQ: Average Insight: Poor Impulse Control: Fair Appetite: Fair Weight Loss: 0 Weight Gain: 0 Sleep: Decreased Total Hours of Sleep: 5 Vegetative Symptoms: None  ADLScreening [facility] Assessment Services) Patient's cognitive ability adequate to safely complete daily activities?: Yes Patient able to express need for assistance with ADLs?: Yes Independently performs ADLs?: Yes (appropriate for developmental age)  Prior Inpatient Therapy Prior Inpatient Therapy: No Prior Therapy Dates: None  Prior Therapy Facilty/Provider(s): None  Reason for Treatment: None   Prior Outpatient Therapy Prior Outpatient Therapy: No Prior Therapy Dates: None  Prior Therapy Facilty/Provider(s): None  Reason for Treatment: None  Does patient have an ACCT team?: No Does patient have Intensive In-House Services?  :  No Does patient have [facility] services? : No Does patient have P4CC services?: No  ADL Screening (condition at time of admission) Patient's cognitive ability adequate to safely complete daily activities?: Yes Is the patient deaf or have difficulty hearing?: No Does the patient have difficulty seeing, even when wearing glasses/contacts?: No Does the patient  have difficulty concentrating, remembering, or making decisions?: No Patient able to express need for assistance with ADLs?: Yes Does the patient have difficulty dressing or bathing?: No Independently performs ADLs?: Yes (appropriate for developmental age) Does the patient have difficulty walking or climbing stairs?: No Weakness of Legs: None Weakness of Arms/Hands: None  Home Assistive Devices/Equipment Home Assistive Devices/Equipment: None  Therapy Consults (therapy consults require a physician order) PT Evaluation Needed: No OT Evalulation Needed: No SLP Evaluation Needed: No Abuse/Neglect Assessment (Assessment to be complete while patient is alone) Physical Abuse: Denies Verbal Abuse: Denies Sexual Abuse: Denies Exploitation of patient/patient's resources: Denies Self-Neglect: Denies Values / Beliefs Cultural Requests During Hospitalization: None Spiritual Requests During Hospitalization: None Consults Spiritual Care Consult Needed: No Social Work Consult Needed: No Merchant navy officer (For Healthcare) Does patient have an advance directive?: No Would patient like information on creating an advanced directive?: No - patient declined information    Additional Information 1:1 In Past 12 Months?: No CIRT Risk: No Elopement Risk: No Does patient have medical clearance?: Yes     Disposition:  Disposition Initial Assessment Completed for this Encounter: Yes Disposition of Patient: Inpatient treatment program, Referred to (Per Donell Sievert, PA meets criteria; 302-2) Type of inpatient treatment program: Adult Patient referred to: Other (Comment) (Per Donell Sievert, PA meets criteria; 302-2)  Murrell Redden 06/02/2015 6:34 AM

## 2015-06-02 NOTE — ED Notes (Signed)
Pt given warm blanket, pt resting, resp even and non labored, sitter remains at bedside,

## 2015-06-02 NOTE — Progress Notes (Signed)
[facility] Dr. Dub MikesLugo. Admission is voluntary, RN report can be called at [phone]. [facility].  Ilean SkillMeghan Jadynn Epping, MSW, LCSW Clinical Social Work, Disposition  06/02/2015 [phone]

## 2015-06-02 NOTE — ED Notes (Signed)
Assumed care of patient from [place], [place]. Pt is awaiting results of TTS at this time. Pt reports feeling anxious and thinks he is "going through withdrawals" - notified EDP for prn medication order. VSS. No distress. Sitter at bedside for silicidal ideations. Suicide precautions in place and maintained. Pt cooperative and calm at this time.

## 2015-06-02 NOTE — ED Notes (Signed)
Called [facility].  Driver will be here [place] [phone] min. [place]  Nurse informed.

## 2015-06-02 NOTE — Progress Notes (Signed)
Patient ID: Roberto [name], male   DOB: 08/15/1945, 69 y.o.   MRN: [id number]  Pt currently presents with a masked affect and restless behavior. Pt reports "I don't need to be here, I shouldn't have said anything about wanting to hurt myself." Pt reports that he has an increased amount of alcohol and crack cocaine intake. Pt reports "I was really worried when I was waiting in the liquor store parking lot for the doors to open." Pt states "I haven't been sleeping, I will wake up and drink some more then try to go back to bed." Pt reports medical history of cirrhosis of the liver, HTN and COPD. Pt also reports a history of depression and anxiety. Pt currently has abrasion on his R foot from "stepping on a broken glass bottle in the middle of the night." Pt wound assessed and clean dressing placed. Pt has a rash on his buttocks and lower abdomen that is recurrent seasonally for him, pt states "I typically just put some cream on it." Pt reports increased stress since moving into the hotel in which he currently lives. Pt reports that many of the other pts drink and do drugs at the hotel. Pt currently wished to be discharged back to the hotel because "they said they were renovating all the rooms and I can't afford anything else."   Pt tremulous, has trouble signing consent forms. Pt unsteady gait, order for walker use placed. Pt found in another pts room, sitting on the bed. 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 Pt placed on high fall risk and advised of fall risk precautions. Consents signed, skin/belongings search completed and pt oriented to unit. Pt stable at this time. Pt given the opportunity to express concerns and ask questions. Pt given toiletries. Will continue to monitor.

## 2015-06-02 NOTE — B[facility] Counselor (Signed)
PSA attempted, patient recently agitated in hallway now in bed resting, CSW will revisit to complete PSA tomorrow.  Santa GeneraAnne Leaner Morici, LCSW Lead Clinical Social Worker Phone:  [phone]

## 2015-06-03 ENCOUNTER — Encounter (HOSPITAL_COMMUNITY): Payer: Self-pay | Admitting: Psychiatry

## 2015-06-03 DIAGNOSIS — F1094 Alcohol use, unspecified with alcohol-induced mood disorder: Secondary | ICD-10-CM

## 2015-06-03 DIAGNOSIS — F10929 Alcohol use, unspecified with intoxication, unspecified: Secondary | ICD-10-CM | POA: Diagnosis present

## 2015-06-03 MED ORDER — NICOTINE 21 MG/24HR TD PT24
21.0000 mg | MEDICATED_PATCH | Freq: Every day | TRANSDERMAL | Status: DC
  Administered 2015-06-04 – 2015-06-09 (×6): 21 mg via TRANSDERMAL
  Filled 2015-06-03 (×10): qty 1

## 2015-06-03 NOTE — [facility] Counselor (Signed)
Adult Comprehensive Assessment  Patient ID: Roberto [name], male   DOB: 1946-05-13, 69 y.o.   MRN: [id number]  Information Source: Information source: Patient  Current Stressors:  Educational / Learning stressors: N/A Employment / Job issues: Retired Family Relationships: Reports a decent relationship with his mother, half-brother, and 2 sons Surveyor, quantity / Lack of resources (include bankruptcy): N/A- receives [employer] / Lack of housing: Lives in a extended stay motel in [place], reports that it is a rough environment Physical health (include injuries & life threatening diseases): Cirrhosis, ulcer, neck pains, recent loss of hearing, hearing something that resembles "steam" all the time  Social relationships: Reports feeling lonely due to lack of companionship Substance abuse: Daily ETOH use- 6 to 12 beers daily Bereavement / Loss: Became tearful when discussing that his father died when patient was 50 years old and that mother is in a nursing home  Living/Environment/Situation:  Living Arrangements: Alone Living conditions (as described by patient or guardian): Lives in a extended stay motel in [place], reports that it is a rough environment How long has patient lived in current situation?: 8 months What is atmosphere in current home: Chaotic  Family History:  Marital status: Divorced Divorced, when?: 1982 Does patient have children?: Yes How many children?: 2 How is patient's relationship with their children?: Reports that he talks to his 2 adult sons almost daily  Childhood History:  By whom was/is the patient raised?: Mother/father and step-parent Description of patient's relationship with caregiver when they were a child: Father died when patient was 59 y.o., close with mother. Reports that step-father was "mean and bossy" so did not get along well Patient's description of current relationship with people who raised him/her: Reports a good relationship with mother who is in  a nursing home Does patient have siblings?: Yes Number of Siblings: 2 Description of patient's current relationship with siblings: Reports a decent relationship with 1 half-brother, not close with his other half-brother Did patient suffer any verbal/emotional/physical/sexual abuse as a child?: No Did patient suffer from severe childhood neglect?: No Has patient ever been sexually abused/assaulted/raped as an adolescent or adult?: No Was the patient ever a victim of a crime or a disaster?: No Witnessed domestic violence?: No Has patient been effected by domestic violence as an adult?: No  Education:  Highest grade of school patient has completed: Automotive engineer Currently a Consulting civil engineer?: No Learning disability?: No  Employment/Work Situation:   Employment situation:  (Retired) Patient's job has been impacted by current illness: No What is the longest time patient has a held a job?: 14 years Where was the patient employed at that time?: [employer]  Has patient ever been in the [employer]?: No Has patient ever served in Buyer, retail?: No  Financial Resources:   Surveyor, quantity resources: Writer Does patient have a Lawyer or guardian?: No  Alcohol/Substance Abuse:   What has been your use of drugs/alcohol within the last 12 months?: Daily ETOH use- 6 to 12 beers daily If attempted suicide, did drugs/alcohol play a role in this?: No Alcohol/Substance Abuse Treatment Hx: Past Tx, Inpatient, Past detox If yes, describe treatment: [facility] years ago Has alcohol/substance abuse ever caused legal problems?: Yes (DUI 10+ years ago)  Social Support System:   Forensic psychologist System: Poor Describe Community Support System: 2 sons  Type of faith/religion: Patient reports that he has doubts about his religion How does patient's faith help to cope with current illness?: Unable to answer  Leisure/Recreation:  Leisure and Hobbies: Used to enjoy playing golf  but now spends a lot of time watching game shows  Strengths/Needs:   What things does the patient do well?: Competitive, strong-willed In what areas does patient struggle / problems for patient: Alcoholism, lack of social supports, feeling lonely, depression  Discharge Plan:   Does patient have access to transportation?: No Plan for no access to transportation at discharge: Patient encouraged to contact family re: transportation home. [facility] Will patient be returning to same living situation after discharge?: Yes Currently receiving community mental health services: No If no, would patient like referral for services when discharged?: Yes (What county?) [facility]([place]) Does patient have financial barriers related to discharge medications?: No  Summary/Recommendations:     Patient is a 69 year old Caucasian male admitted for ETOH abuse. Patient [place] in [place] and plans to return at discharge. Stressors include: feeling lonely and his alcoholism  Patient identifies his 2 adult sons as supportive. He reports that he is not interested in residential tx or AA at this time. He prefers to return home to follow up with outpatient services. Patient identifies his goals as to "get my health back, get my license, start playing golf again." Patient will benefit from crisis stabilization, medication evaluation, group therapy, and psycho education in addition to case management for discharge planning. Patient and CSW reviewed pt's identified goals and treatment plan. Pt verbalized understanding and agreed to treatment plan.    Roberto [name], Roberto CarboKristin L. 06/03/2015

## 2015-06-03 NOTE — [facility] Suicide Risk Assessment (Signed)
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 Alcohol dependence (HCC) [F10.20] 06/02/2015  . Acute encephalopathy [G93.40] 09/20/2014  . Alcoholic ketoacidosis [E87.2] 91/47/8295  . Delirium tremens (HCC) [F10.231] 09/19/2014  . Hypokalemia [E87.6] 05/20/2012  . Acute respiratory failure (HCC) [J96.00] 05/19/2012  . Severe protein-calorie malnutrition (HCC) [E43] 05/19/2012  . Sepsis (HCC) [A41.9] 05/19/2012  . Alcohol withdrawal (HCC) [F10.239] 05/18/2012  . Encephalopathy [G93.40] 05/18/2012  . Dyspnea [R06.00] 05/18/2012  . Ascites [R18.8] 05/18/2012  . Volume overload [E87.70] 05/18/2012  . Helicobacter pylori antibody positive [R76.0] 12/19/2011  . Adynamic ileus (HCC) [K56.0] 12/18/2011  . 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 Hx of substance abuse [Z87.898] 12/17/2011  . Cirrhosis (HCC) [K74.60] 12/16/2011  . Abdominal pain, diffuse [R10.9] 12/15/2011  . Thrombocytopenia (HCC) [D69.6] 12/15/2011  . Duodenal ulcer with hemorrhage [K26.4] 12/15/2011  . Esophageal varices without bleeding (HCC) [I85.00] 12/15/2011  . Hepatic steatosis [K76.0] 12/14/2011  . Compression fracture of L1 lumbar vertebra (HCC) [S32.010A] 12/14/2011  . Hypocalcemia [E83.51] 12/14/2011  . Acute renal failure (HCC) [N17.9] 12/14/2011  . Hypernatremia [E87.0] 12/14/2011  . Hypotension [458] 12/14/2011  . Hyperglycemia [R73.9] 12/14/2011  . Alcoholic hepatitis [K70.10] 12/14/2011  . GI bleed [K92.2] 12/13/2011  .  Acute blood loss anemia [D62] 12/13/2011  . Alcohol abuse [F10.10] 12/13/2011  . Jaundice [R17] 12/13/2011  . Coagulopathy (HCC) [D68.9] 12/13/2011  . Macrocytic anemia [D53.9] 12/13/2011  . Renal insufficiency [N28.9] 12/13/2011  . Hepatic encephalopathy (HCC) [K72.90] 12/13/2011  . UTI (urinary tract infection) [N39.0] 12/13/2011  . Debility [R53.81] 12/13/2011  . Frequent falls [R29.6] 12/13/2011  . Pneumonia [J18.9] 08/17/2011  . HTN (hypertension) [I10] 08/17/2011  . COPD (chronic obstructive pulmonary disease) (HCC) [J44.9] 08/17/2011  . Smoker, current status unknown [Z72.0] 08/17/2011     Continued Clinical Symptoms:  Alcohol Use Disorder Identification Test Final Score (AUDIT): 24 The "Alcohol Use Disorders Identification Test", Guidelines for Use in Primary Care, Second Edition.  World Science writer [facility]). Score between 0-7:  no or low risk or alcohol related problems. Score between 8-15:  moderate risk of alcohol related problems. Score between 16-19:  high risk of alcohol related problems. Score 20 or above:  warrants further diagnostic evaluation for alcohol dependence and treatment.   CLINICAL FACTORS:   Alcohol/Substance Abuse/Dependencies  Psychiatric Specialty Exam: Physical Exam  ROS  Blood pressure 140/74, pulse 86, temperature 97.9 F (36.6 C), temperature source Oral, resp. rate 18, height  (1.854 m), weight 95.709 kg (211 lb), SpO2 96 %.Body mass index is 27.84 kg/(m^2).    COGNITIVE FEATURES THAT CONTRIBUTE TO RISK:  Closed-mindedness, Polarized thinking and Thought constriction (tunnel vision)    SUICIDE RISK:   Mild:  Suicidal ideation of limited frequency, intensity, duration, and specificity.  There are no identifiable plans, no associated intent, mild dysphoria and related symptoms, good self-control (both objective and subjective assessment), few other risk factors, and identifiable protective factors, including available and accessible  social support.  PLAN OF CARE: see admission H and P  Medical  Decision Making:  Review of Psycho-Social Stressors (1), Review or order clinical lab tests (1), Review of Medication Regimen & Side Effects (2) and Review of New Medication or Change in Dosage (2)  I certify that inpatient services furnished can reasonably be expected to improve the patient's condition.   Kynzi Levay A 06/03/2015, 10:32 AM

## 2015-06-03 NOTE — Progress Notes (Signed)
Patient ID: Roberto [name], male   DOB: 07/21/1946, 69 y.o.   MRN: [id number] D: Client visible on the unit, reports anxiety asking for Albuterol. Client notes the reason for admission "it started on my birthday [birth year]" "I started drinking and couldn't get off that train" Client reports wants to stop "I got to great sons" Client has dressing to right foot, dry & intact. Client animated, "you know what RN stands for?" "Ron's Nurse" Client not using walking, walking slowly. A: Writer provided emotional support, encouraged use of walker. O2 stat monitored 99% on RA, "I'm having some wheezing" Staff will monitor q5915min for safety. R: Client is safe on the unit, attended karaoke, no wheezing auscultated.

## 2015-06-03 NOTE — [facility] Suicide Risk Assessment (Signed)
[facility] INPATIENT:  Family/Significant Other Suicide Prevention Education  Suicide Prevention Education:  Patient Refusal for Family/Significant Other Suicide Prevention Education: The patient Roberto [name] has refused to provide written consent for family/significant other to be provided Family/Significant Other Suicide Prevention Education during admission and/or prior to discharge.  Physician notified. SPE reviewed with patient and brochure provided. Patient encouraged to return to hospital if having suicidal thoughts, patient verbalized his/her understanding and has no further questions at this time.   [place] Basham, [place] CarboKristin L 06/03/2015, 1:08 PM

## 2015-06-03 NOTE — Progress Notes (Signed)
Patient ID: Roberto [name], male   DOB: April 21, 1946, 69 y.o.   MRN: [id number]  Pt currently presents with a flat affect and anxious behavior. Per self inventory, pt rates depression at a 8, hopelessness 5 and anxiety 9. Pt's daily goal is to "get strength back, get some peaceful sleep" and they intend to do so by "rest, eat well." Pt reports poor sleep, a fair appetite, low energy and poor concentration. Pt performed ADLs independently today. Pt walked in hallways without his walker and stated "I feel less shaky today than I did before."   Pt provided with medications per providers orders. Pt's labs and vitals were monitored throughout the day. Pt supported emotionally and encouraged to express concerns and questions. Pt educated on medications. Pt advised about fall precautions and encouraged to continue using walker as needed.  Pt's safety ensured with 15 minute and environmental checks. Pt currently denies SI/HI and A/V hallucinations. Pt verbally agrees to seek staff if SI/HI or A/VH occurs and to consult with staff before acting on these thoughts. Will continue POC.
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
## 2015-06-03 NOTE — [facility] Group Notes (Signed)
[facility] LCSW Group Therapy 08/12/2014  1:15 PM   Type of Therapy: Group Therapy  Participation Level: Did Not Attend. Patient invited to participate but declined.   Iliza Blankenbeckler, MSW, LCSWA Clinical Social Worker [facility] [phone]   

## 2015-06-03 NOTE — Progress Notes (Signed)
Pt has been in his room most of the evening.  He did come out to use the phone and to get a snack in the dayroom.  His main complaint is the fungal rash to his groin area and side.  He reports that it occurs seasonally and that he uses Lamicil for relief.  Pt said he was told that the MD would be ordering something for the rash, but it has not been done yet.  Writer spoke with PA who ordered Nystatin powder to be applied on a scheduled basis.  Pt states he is not really having any significant withdrawal symptoms.  He has a bandage to his R foot where he stepped on broken glass.  He is not c/o pain at this time.  Pt encouraged to express his needs or concerns.  Support and encouragement offered.  Safety maintained with q15 minute checks.

## 2015-06-04 MED ORDER — ESCITALOPRAM OXALATE 5 MG PO TABS
5.0000 mg | ORAL_TABLET | Freq: Every day | ORAL | Status: DC
  Administered 2015-06-05 – 2015-06-07 (×3): 5 mg via ORAL
  Filled 2015-06-04 (×4): qty 1

## 2015-06-04 MED ORDER — MIRTAZAPINE 15 MG PO TABS
15.0000 mg | ORAL_TABLET | Freq: Every day | ORAL | Status: DC
  Administered 2015-06-04 – 2015-06-09 (×6): 15 mg via ORAL
  Filled 2015-06-04: qty 3
  Filled 2015-06-04 (×8): qty 1

## 2015-06-04 NOTE — Progress Notes (Signed)
[facility] MD Progress Note  06/04/2015 5:52 PM Roberto [name]  MRN:  [id number] Subjective:  Roberto [name] is having a hard time. He states he is not sleeping well, he is really depressed cant stop thinking about all the things he did wrong in his past. States he has so much to do out of here yet does not seem to have the "strenght" to move on. States he is not having the visual distortions anymore. 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 Alcohol dependence (HCC) [F10.20] 06/02/2015  . Acute encephalopathy [G93.40] 09/20/2014  . Alcoholic ketoacidosis [E87.2] 56/21/3086  . Delirium tremens (HCC) [F10.231] 09/19/2014  . Hypokalemia [E87.6] 05/20/2012  . Acute respiratory failure (HCC) [J96.00] 05/19/2012  . Severe protein-calorie malnutrition (HCC) [E43] 05/19/2012  . Sepsis (HCC) [A41.9] 05/19/2012  . Alcohol withdrawal (HCC) [F10.239] 05/18/2012  . Encephalopathy [G93.40] 05/18/2012  . Dyspnea [R06.00] 05/18/2012  . Ascites [R18.8] 05/18/2012  . Volume overload [E87.70] 05/18/2012  . Helicobacter pylori antibody positive [R76.0] 12/19/2011  . Adynamic ileus (HCC) [K56.0] 12/18/2011  . 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 Hx of substance abuse [Z87.898] 12/17/2011  . Cirrhosis (HCC) [K74.60] 12/16/2011  . Abdominal pain, diffuse [R10.9] 12/15/2011  . Thrombocytopenia (HCC) [D69.6] 12/15/2011  . Duodenal ulcer with hemorrhage [K26.4] 12/15/2011  . Esophageal varices without bleeding (HCC) [I85.00] 12/15/2011  . Hepatic steatosis [K76.0] 12/14/2011  . Compression fracture of L1 lumbar vertebra (HCC) [S32.010A] 12/14/2011  . Hypocalcemia [E83.51] 12/14/2011  . Acute renal failure (HCC) [N17.9]  12/14/2011  . Hypernatremia [E87.0] 12/14/2011  . Hypotension [458] 12/14/2011  . Hyperglycemia [R73.9] 12/14/2011  . Alcoholic hepatitis [K70.10] 12/14/2011  . GI bleed [K92.2] 12/13/2011  . Acute blood loss anemia [D62] 12/13/2011  . Alcohol abuse [F10.10] 12/13/2011  . Jaundice [R17] 12/13/2011  . Coagulopathy (HCC) [D68.9] 12/13/2011  . Macrocytic anemia [D53.9] 12/13/2011  . Renal insufficiency [N28.9] 12/13/2011  . Hepatic encephalopathy (HCC) [K72.90] 12/13/2011  . UTI (urinary tract infection) [N39.0] 12/13/2011  . Debility [R53.81] 12/13/2011  . Frequent falls [R29.6] 12/13/2011  . Pneumonia [J18.9] 08/17/2011  . HTN (hypertension) [I10] 08/17/2011  . COPD (chronic obstructive pulmonary disease) (HCC) [J44.9] 08/17/2011  . Smoker, current status unknown [Z72.0] 08/17/2011   Total Time spent with patient: 30 minutes  Past Psychiatric History: see admission H and P  Past Medical History:  Past Medical History  Diagnosis Date  . COPD (chronic obstructive pulmonary disease) (HCC)   . Hypertension   . Hepatic steatosis 12/14/2011  . Compression fracture of L1 lumbar vertebra (HCC) 12/14/2011  . C2 cervical fracture (HCC)   . Duodenal ulcer with hemorrhage 12/15/2011    Likely source of bleeding.per EGD; Dr. Karilyn Cota  . Esophageal varices without bleeding (HCC) 12/15/2011    per EGD  . Cirrhosis (HCC) 12/16/2011    per ultrasound  . Hx of substance abuse 12/17/2011  . 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 Hepatic encephalopathy (HCC)   . Helicobacter pylori antibody positive 12/19/2011  . Encephalopathy 05/18/2012  . UTI (urinary tract infection) 12/13/2011  . Ascites 05/18/2012    S/p paracentesis yielding 7880 mL  . Cirrhosis (HCC)   . Depression     Past Surgical History  Procedure Laterality Date  . Coronary angioplasty  with stent placement     Family History: History reviewed. No pertinent family history. Family Psychiatric   History: see admission H and P Social History:  History  Alcohol Use  . Yes    Comment: drinking everyday.     History  Drug Use  . Yes  . Special: Cocaine    Comment: prescription drugs/   says no cocaine in 1 month  12/03/2013    Social History   Social History  . Marital Status: Divorced    Spouse Name: N/A  . Number of Children: N/A  . Years of Education: N/A   Social History Main Topics  . Smoking status: Current Every Day Smoker -- 1.00 packs/day    Types: Cigarettes  . Smokeless tobacco: Current User     Comment: 1 1/2 pack a day  . Alcohol Use: Yes     Comment: drinking everyday.  . Drug Use: Yes    Special: Cocaine     Comment: prescription drugs/   says no cocaine in 1 month  12/03/2013  . Sexual Activity: Not Currently   Other Topics Concern  . None   Social History Narrative   Additional Social History:    Pain Medications: denies History of alcohol / drug use?: Yes Longest period of sobriety (when/how long): None  Negative Consequences of Use: Financial, Personal relationships Withdrawal Symptoms:  (muscle weakness, tremors) Name of Substance 1: Alcohol  1 - Age of First Use: 16 yoa 1 - Amount (size/oz): 12 pack or 4 40's 1 - Frequency: Daily  1 - Duration: On-going  1 - Last Use / Amount: 06/01/15 Name of Substance 2: Crack  2 - Age of First Use: 40's  2 - Amount (size/oz): $60 2 - Frequency: Wkly  2 - Duration: On-going  2 - Last Use / Amount: 1 Wk Ago                 Sleep: Poor  Appetite:  Poor  Current Medications: Current Facility-Administered Medications  Medication Dose Route Frequency Provider Last Rate Last Dose  . acetaminophen (TYLENOL) tablet 650 mg  650 mg Oral Q6H PRN Rachael Fee, MD      . albuterol (PROVENTIL HFA;VENTOLIN HFA) 108 (90 BASE) MCG/ACT inhaler 1-2 puff  1-2 puff Inhalation Q6H PRN Rachael Fee, MD   2 puff at 06/04/15 1611  . alum & mag hydroxide-simeth (MAALOX/MYLANTA) 200-200-20 MG/5ML suspension 30  mL  30 mL Oral Q4H PRN Rachael Fee, MD      . hydrOXYzine (ATARAX/VISTARIL) tablet 25 mg  25 mg Oral Q6H PRN Kerry Hough, PA-C   25 mg at 06/04/15 1612  . Influenza vac split quadrivalent PF (FLUARIX) injection 0.5 mL  0.5 mL Intramuscular Tomorrow-1000 Rachael Fee, MD   0.5 mL at 06/04/15 1028  . loperamide (IMODIUM) capsule 2-4 mg  2-4 mg Oral PRN Rachael Fee, MD      . LORazepam (ATIVAN) tablet 1 mg  1 mg Oral Q6H PRN Rachael Fee, MD   1 mg at 06/03/15 1540  . [START ON 06/05/2015] LORazepam (ATIVAN) tablet 1 mg  1 mg Oral BID Rachael Fee, MD       Followed by  . [START ON 06/06/2015] LORazepam (ATIVAN) tablet 1 mg  1 mg Oral Daily Rachael Fee, MD      . magnesium hydroxide (MILK OF MAGNESIA) suspension 30 mL  30 mL Oral Daily PRN Rachael Fee, MD      .  multivitamin with minerals tablet 1 tablet  1 tablet Oral Daily Rachael Fee, MD   1 tablet at 06/04/15 [phone]  . nicotine (NICODERM CQ - dosed in mg/24 hours) patch 21 mg  21 mg Transdermal Daily Kerry Hough, PA-C   21 mg at 06/04/15 9604  . nystatin (MYCOSTATIN/NYSTOP) topical powder   Topical TID Kerry Hough, PA-C      . ondansetron (ZOFRAN-ODT) disintegrating tablet 4 mg  4 mg Oral Q6H PRN Rachael Fee, MD      . pantoprazole (PROTONIX) EC tablet 40 mg  40 mg Oral Daily Rachael Fee, MD   40 mg at 06/04/15 [phone]  . pneumococcal 23 valent vaccine (PNU-IMMUNE) injection 0.5 mL  0.5 mL Intramuscular Tomorrow-1000 Rachael Fee, MD   0.5 mL at 06/04/15 1029  . thiamine (VITAMIN B-1) tablet 100 mg  100 mg Oral Daily Rachael Fee, MD   100 mg at 06/04/15 8119  . traZODone (DESYREL) tablet 50 mg  50 mg Oral QHS PRN Rachael Fee, MD   50 mg at 06/03/15 2209    Lab Results: No results found for this or any previous visit (from the past 48 hour(s)).  Physical Findings: AIMS: Facial and Oral Movements Muscles of Facial Expression: None, normal Lips and Perioral Area: None, normal Jaw: None, normal Tongue: None,  normal,Extremity Movements Upper (arms, wrists, hands, fingers): None, normal Lower (legs, knees, ankles, toes): None, normal, Trunk Movements Neck, shoulders, hips: None, normal, Overall Severity Severity of abnormal movements (highest score from questions above): None, normal Incapacitation due to abnormal movements: None, normal Patient's awareness of abnormal movements (rate only patient's report): No Awareness, Dental Status Current problems with teeth and/or dentures?: Yes Does patient usually wear dentures?: Yes  CIWA:  CIWA-Ar Total: 0 COWS:  COWS Total Score: 7  Musculoskeletal: Strength & Muscle Tone: decreased Gait & Station: unsteady Patient leans: normal  Psychiatric Specialty Exam: Review of Systems  Constitutional: Positive for malaise/fatigue.  HENT: Negative.   Eyes: Negative.   Respiratory: Negative.   Cardiovascular: Negative.   Gastrointestinal: Negative.   Genitourinary: Negative.   Musculoskeletal: Positive for back pain.  Skin: Negative.   Neurological: Positive for weakness.  Endo/Heme/Allergies: Negative.   Psychiatric/Behavioral: Positive for depression and substance abuse. The patient is nervous/anxious and has insomnia.     Blood pressure 154/81, pulse 82, temperature 98 F (36.7 C), temperature source Oral, resp. rate 16, height  (1.854 m), weight 95.709 kg (211 lb), SpO2 99 %.Body mass index is 27.84 kg/(m^2).  General Appearance: Fairly Groomed  Patent attorney::  Fair  Speech:  Clear and Coherent  Volume:  Decreased  Mood:  Anxious and Depressed  Affect:  Depressed and Tearful  Thought Process:  Coherent and Goal Directed  Orientation:  Full (Time, Place, and Person)  Thought Content:  symptoms events worries concerns  Suicidal Thoughts:  No  Homicidal Thoughts:  No  Memory:  Immediate;   Fair Recent;   Fair Remote;   Fair  Judgement:  Fair  Insight:  Present  Psychomotor Activity:  Restlessness  Concentration:  Fair  Recall:  [employer] of Knowledge:Fair  Language: Fair  Akathisia:  No  Handed:  Right  AIMS (if indicated):     Assets:  Desire for Improvement Housing  ADL's:  Intact  Cognition: WNL  Sleep:  Number of Hours: 6   Treatment Plan Summary: Daily contact with patient to assess and evaluate symptoms and progress in  treatment and Medication management Supportive approach/coping skills Alcohol dependence; continue Ativan detox protocol/work a relapse prevention plan Depression; start Lexapro 10 mg daily Insomnia; will D/C the Trazodone and try Remeron 15 mg HS Will work with CBT/midfulness  Explore need to go in a residential treatment program;  Darreld Hoffer A 06/04/2015, 5:52 PM

## 2015-06-04 NOTE — Tx Team (Addendum)
Interdisciplinary Treatment Plan Update (Adult) Date: 06/04/2015    Time Reviewed: 9:30 AM  Progress in Treatment: Attending groups: Continuing to assess, patient new to milieu Participating in groups: Continuing to assess, patient new to milieu Taking medication as prescribed: Yes Tolerating medication: Yes Family/Significant other contact made: No, patient has declined collateral contact Patient understands diagnosis: Yes Discussing patient identified problems/goals with staff: Yes Medical problems stabilized or resolved: Yes Denies suicidal/homicidal ideation: Yes Issues/concerns per patient self-inventory: Yes Other:  New problem(s) identified: N/A  Discharge Plan or Barriers: Home with outpatient services.  Reason for Continuation of Hospitalization:  Depression Anxiety Medication Stabilization   Comments: N/A  Estimated length of stay: 2-3 days   Patient is a 69 year old Caucasian male admitted for ETOH abuse. Patient lives alone in an extended stay motel in [place] and plans to return at discharge. Stressors include: feeling lonely and his alcoholism Patient identifies his 2 adult sons as supportive. He reports that he is not interested in residential tx or AA at this time. He prefers to return home to follow up with outpatient services. Patient identifies his goals as to "get my health back, get my license, start playing golf again." Patient will benefit from crisis stabilization, medication evaluation, group therapy, and psycho education in addition to case management for discharge planning. Patient and CSW reviewed pt's identified goals and treatment plan. Pt verbalized understanding and agreed to treatment plan.     Review of initial/current patient goals per problem list:  1. Goal(s): Patient will participate in aftercare plan   Met: Yes   Target date: 3-5 days post admission date   As evidenced by: Patient will participate within aftercare plan AEB  aftercare provider and housing plan at discharge being identified.  11/4: Goal met: Patient plans to return home with outpatient services at discharge.     2. Goal (s): Patient will exhibit decreased depressive symptoms and suicidal ideations.   Met: Goal Progressing   Target date: 3-5 days post admission date   As evidenced by: Patient will utilize self rating of depression at 3 or below and demonstrate decreased signs of depression or be deemed stable for discharge by MD.  11/4: Goal progressing. Patient rates depression at 8-9 today, denies SI.  11/7: Goal Progressing. Patient rates depression at 5 today, denies SI.     3. Goal(s): Patient will demonstrate decreased signs and symptoms of anxiety.   Met: Goal Progressing   Target date: 3-5 days post admission date   As evidenced by: Patient will utilize self rating of anxiety at 3 or below and demonstrated decreased signs of anxiety, or be deemed stable for discharge by MD  11/4: Goal Progressing. Patient rates anxiety as high today.  11/7: Goal Progressing. Patient rates anxiety as high today.    4. Goal(s): Patient will demonstrate decreased signs of withdrawal due to substance abuse   Met: Goal Progressing   Target date: 3-5 days post admission date   As evidenced by: Patient will produce a CIWA/COWS score of 0, have stable vitals signs, and no symptoms of withdrawal  11/4: Goal Progressing. Patient with CIWA score of 1 today, experiencing tremors.  11/7: Goal not met: Pt continues to have withdrawal symptoms of tremor and anxiety and a CIWA score of a 4 today.  Pt to show decrease withdrawal symptoms prior to d/c.       Attendees: Patient:    Family:    Physician: Dr. Jama Flavors; Dr. Dub Mikes 06/04/2015 9:30 AM  Nursing: Neldon Mc, RN 06/04/2015 9:30 AM  Clinical Social Worker: Tilden Fossa, [place] 06/04/2015 9:30 AM  Other: Peri Maris, LCSWA 06/04/2015 9:30 AM  Other: Norberto Sorenson, P4CC   06/04/2015 9:30 AM  Other: Lars Pinks, Case Manager 06/04/2015 9:30 AM  Other: 06/04/2015 9:30 AM     Scribe for Treatment Team:  Tilden Fossa, MSW, [employer] [phone]

## 2015-06-04 NOTE — Progress Notes (Signed)
D.  Pt pleasant but very anxious on approach.  Pt concerned about his insomnia and is hopeful that the new order doctor put in today will be effective tonight.  Pt did not feel able to attend evening AA group.  Interacting appropriately with peers on the unit.  A.  Support and encouragement offered, medication given as ordered  R.  Pt remains safe on the unit, will continue to monitor.

## 2015-06-04 NOTE — Progress Notes (Signed)
Patient has been in the bed for the majority of the shift.  Patient has been depressed and tearful.  Patient reported having passive death wish, but no suicidal ideations. Patient unable to attend groups and go to the cafeteria due to anxiety.  Patient denies HI and AVH.  Assess patient for safety, offer medications as prescribed, encourage patient to participate in therapy.   Patient able to contract for safety.  Patient continues to report low mood.  Patient consulted with physician about adding antidepressant to medication routine.

## 2015-06-04 NOTE — [facility] Group Notes (Signed)
   [facility]  06/04/2015  8:45 AM   Participation Quality: Alert, Appropriate and Oriented  Mood/Affect: Appropriate  Depression Rating: 8-9  Anxiety Rating: High anxiety levels today  Thoughts of Suicide: Pt denies SI/HI  Will you contract for safety? Yes  Current AVH: Pt denies  Plan for Discharge/Comments: Pt attended discharge planning group and actively participated in group. CSW provided pt with today's workbook. Patient plans to return home to follow up with outpatient services.  Transportation Means: Pt reports access to transportation  Supports: No supports mentioned at this time  Samuella BruinKristin Rickie Gange, MSW, [employer]CSWA Clinical Social Worker [facility] [phone]

## 2015-06-04 NOTE — [facility] Group Notes (Signed)
[facility] LCSW Group Therapy 06/04/2015  1:15 PM   Type of Therapy: Group Therapy  Participation Level: Did Not Attend. Patient invited to participate but declined.   [name], MSW, LCSWA Clinical Social Worker [facility] [phone]   

## 2015-06-05 DIAGNOSIS — F1014 Alcohol abuse with alcohol-induced mood disorder: Secondary | ICD-10-CM

## 2015-06-05 DIAGNOSIS — F10129 Alcohol abuse with intoxication, unspecified: Secondary | ICD-10-CM

## 2015-06-05 MED ORDER — HYDROXYZINE HCL 25 MG PO TABS
25.0000 mg | ORAL_TABLET | Freq: Four times a day (QID) | ORAL | Status: AC | PRN
  Administered 2015-06-05 – 2015-06-08 (×6): 25 mg via ORAL
  Filled 2015-06-05 (×5): qty 1

## 2015-06-05 MED ORDER — LOPERAMIDE HCL 2 MG PO CAPS: 2.0000 mg | ORAL_CAPSULE | ORAL | Status: AC | PRN

## 2015-06-05 NOTE — Progress Notes (Signed)
[facility] MD Progress Note  06/05/2015 5:49 PM Roberto [name]  MRN:  [id number]   ON Evaluation:   Roberto [name] is awake and alert. Patient has concerns of increased anxiety and shakiness. Per In CWIA are 4 to 5.  states he hasn't been resting well over the past couple of weeks, States is  Depression 5/10 and is still ruminating on his past behaviors. Patient denies suicidal and homicidal ideation. Support, encouragement and reassurance provided.   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 Alcohol dependence (HCC) [F10.20] 06/02/2015  . Acute encephalopathy [G93.40] 09/20/2014  . Alcoholic ketoacidosis [E87.2] 40/98/1191  . Delirium tremens (HCC) [F10.231] 09/19/2014  . Hypokalemia [E87.6] 05/20/2012  . Acute respiratory failure (HCC) [J96.00] 05/19/2012  . Severe protein-calorie malnutrition (HCC) [E43] 05/19/2012  . Sepsis (HCC) [A41.9] 05/19/2012  . Alcohol withdrawal (HCC) [F10.239] 05/18/2012  . Encephalopathy [G93.40] 05/18/2012  . Dyspnea [R06.00] 05/18/2012  . Ascites [R18.8] 05/18/2012  . Volume overload [E87.70] 05/18/2012  . Helicobacter pylori antibody positive [R76.0] 12/19/2011  . Adynamic ileus (HCC) [K56.0] 12/18/2011  . 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 Hx of substance abuse [Z87.898] 12/17/2011  . Cirrhosis (HCC) [K74.60] 12/16/2011  . Abdominal pain, diffuse [R10.9] 12/15/2011  . Thrombocytopenia (HCC) [D69.6] 12/15/2011  . Duodenal ulcer with hemorrhage [K26.4] 12/15/2011  . Esophageal varices without bleeding (HCC) [I85.00] 12/15/2011  . Hepatic steatosis [K76.0] 12/14/2011  . Compression fracture of L1 lumbar vertebra (HCC) [S32.010A] 12/14/2011  . Hypocalcemia [E83.51] 12/14/2011  . Acute renal failure (HCC)  [N17.9] 12/14/2011  . Hypernatremia [E87.0] 12/14/2011  . Hypotension [458] 12/14/2011  . Hyperglycemia [R73.9] 12/14/2011  . Alcoholic hepatitis [K70.10] 12/14/2011  . GI bleed [K92.2] 12/13/2011  . Acute blood loss anemia [D62] 12/13/2011  . Alcohol abuse [F10.10] 12/13/2011  . Jaundice [R17] 12/13/2011  . Coagulopathy (HCC) [D68.9] 12/13/2011  . Macrocytic anemia [D53.9] 12/13/2011  . Renal insufficiency [N28.9] 12/13/2011  . Hepatic encephalopathy (HCC) [K72.90] 12/13/2011  . UTI (urinary tract infection) [N39.0] 12/13/2011  . Debility [R53.81] 12/13/2011  . Frequent falls [R29.6] 12/13/2011  . Pneumonia [J18.9] 08/17/2011  . HTN (hypertension) [I10] 08/17/2011  . COPD (chronic obstructive pulmonary disease) (HCC) [J44.9] 08/17/2011  . Smoker, current status unknown [Z72.0] 08/17/2011   Total Time spent with patient: 30 minutes  Past Psychiatric History: see admission H and P  Past Medical History:  Past Medical History  Diagnosis Date  . COPD (chronic obstructive pulmonary disease) (HCC)   . Hypertension   . Hepatic steatosis 12/14/2011  . Compression fracture of L1 lumbar vertebra (HCC) 12/14/2011  . C2 cervical fracture (HCC)   . Duodenal ulcer with hemorrhage 12/15/2011    Likely source of bleeding.per EGD; Dr. Karilyn [name]  . Esophageal varices without bleeding (HCC) 12/15/2011    per EGD  . Cirrhosis (HCC) 12/16/2011    per ultrasound  . Hx of substance abuse 12/17/2011  . 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 Hepatic encephalopathy (HCC)   . Helicobacter pylori antibody positive 12/19/2011  . Encephalopathy 05/18/2012  . UTI (urinary tract infection) 12/13/2011  . Ascites 05/18/2012    S/p paracentesis yielding 7880 mL  . Cirrhosis (HCC)   . Depression     Past Surgical History  Procedure Laterality Date  . Coronary angioplasty with stent placement  Family History: History reviewed. No pertinent family history. Family  Psychiatric  History: see admission H and P Social History:  History  Alcohol Use  . Yes    Comment: drinking everyday.     History  Drug Use  . Yes  . Special: Cocaine    Comment: prescription drugs/   says no cocaine in 1 month  12/03/2013    Social History   Social History  . Marital Status: Divorced    Spouse Name: N/A  . Number of Children: N/A  . Years of Education: N/A   Social History Main Topics  . Smoking status: Current Every Day Smoker -- 1.00 packs/day    Types: Cigarettes  . Smokeless tobacco: Current User     Comment: 1 1/2 pack a day  . Alcohol Use: Yes     Comment: drinking everyday.  . Drug Use: Yes    Special: Cocaine     Comment: prescription drugs/   says no cocaine in 1 month  12/03/2013  . Sexual Activity: Not Currently   Other Topics Concern  . None   Social History Narrative   Additional Social History:    Pain Medications: denies History of alcohol / drug use?: Yes Longest period of sobriety (when/how long): None  Negative Consequences of Use: Financial, Personal relationships Withdrawal Symptoms:  (muscle weakness, tremors) Name of Substance 1: Alcohol  1 - Age of First Use: 16 yoa 1 - Amount (size/oz): 12 pack or 4 40's 1 - Frequency: Daily  1 - Duration: On-going  1 - Last Use / Amount: 06/01/15 Name of Substance 2: Crack  2 - Age of First Use: 40's  2 - Amount (size/oz): $60 2 - Frequency: Wkly  2 - Duration: On-going  2 - Last Use / Amount: 1 Wk Ago                 Sleep: Poor  Appetite:  Good  Current Medications: Current Facility-Administered Medications  Medication Dose Route Frequency Provider Last Rate Last Dose  . acetaminophen (TYLENOL) tablet 650 mg  650 mg Oral Q6H PRN Rachael FeeIrving A Lugo, MD      . albuterol (PROVENTIL HFA;VENTOLIN HFA) 108 (90 BASE) MCG/ACT inhaler 1-2 puff  1-2 puff Inhalation Q6H PRN Rachael FeeIrving A Lugo, MD   2 puff at 06/05/15 [phone]  . alum & mag hydroxide-simeth (MAALOX/MYLANTA) 200-200-20 MG/5ML  suspension 30 mL  30 mL Oral Q4H PRN Rachael FeeIrving A Lugo, MD   30 mL at 06/04/15 2105  . escitalopram (LEXAPRO) tablet 5 mg  5 mg Oral Daily Rachael FeeIrving A Lugo, MD   5 mg at 06/05/15 0841  . hydrOXYzine (ATARAX/VISTARIL) tablet 25 mg  25 mg Oral Q6H PRN Oneta Rackanika N Lewis, NP      . Influenza vac split quadrivalent PF (FLUARIX) injection 0.5 mL  0.5 mL Intramuscular Tomorrow-1000 Rachael FeeIrving A Lugo, MD   0.5 mL at 06/04/15 1028  . loperamide (IMODIUM) capsule 2-4 mg  2-4 mg Oral PRN Oneta Rackanika N Lewis, NP      . Melene Muller[START ON 06/06/2015] LORazepam (ATIVAN) tablet 1 mg  1 mg Oral Daily Rachael FeeIrving A Lugo, MD      . magnesium hydroxide (MILK OF MAGNESIA) suspension 30 mL  30 mL Oral Daily PRN Rachael FeeIrving A Lugo, MD      . mirtazapine (REMERON) tablet 15 mg  15 mg Oral QHS Rachael FeeIrving A Lugo, MD   15 mg at 06/04/15 2104  . multivitamin with minerals tablet 1 tablet  1  tablet Oral Daily Rachael Fee, MD   1 tablet at 06/05/15 [phone]  . nicotine (NICODERM CQ - dosed in mg/24 hours) patch 21 mg  21 mg Transdermal Daily Kerry Hough, PA-C   21 mg at 06/05/15 0839  . nystatin (MYCOSTATIN/NYSTOP) topical powder   Topical TID Kerry Hough, PA-C      . pantoprazole (PROTONIX) EC tablet 40 mg  40 mg Oral Daily Rachael Fee, MD   40 mg at 06/05/15 [phone]  . pneumococcal 23 valent vaccine (PNU-IMMUNE) injection 0.5 mL  0.5 mL Intramuscular Tomorrow-1000 Rachael Fee, MD   0.5 mL at 06/04/15 1029  . thiamine (VITAMIN B-1) tablet 100 mg  100 mg Oral Daily Rachael Fee, MD   100 mg at 06/05/15 5409    Lab Results: No results found for this or any previous visit (from the past 48 hour(s)).  Physical Findings: AIMS: Facial and Oral Movements Muscles of Facial Expression: None, normal Lips and Perioral Area: None, normal Jaw: None, normal Tongue: None, normal,Extremity Movements Upper (arms, wrists, hands, fingers): None, normal Lower (legs, knees, ankles, toes): None, normal, Trunk Movements Neck, shoulders, hips: None, normal, Overall  Severity Severity of abnormal movements (highest score from questions above): None, normal Incapacitation due to abnormal movements: None, normal Patient's awareness of abnormal movements (rate only patient's report): No Awareness, Dental Status Current problems with teeth and/or dentures?: Yes Does patient usually wear dentures?: Yes  CIWA:  CIWA-Ar Total: 5 COWS:  COWS Total Score: 7  Musculoskeletal: Strength & Muscle Tone: within normal limits and decreased Gait & Station: normal Patient leans: normal  Psychiatric Specialty Exam: Review of Systems  Constitutional: Positive for malaise/fatigue.  HENT: Negative.   Eyes: Negative.   Respiratory: Negative.   Cardiovascular: Negative.   Gastrointestinal: Negative.   Genitourinary: Negative.   Musculoskeletal: Positive for back pain.  Skin: Negative.   Neurological: Positive for weakness.  Endo/Heme/Allergies: Negative.   Psychiatric/Behavioral: Positive for depression and substance abuse. The patient is nervous/anxious and has insomnia.     Blood pressure 149/80, pulse 74, temperature 97.8 F (36.6 C), temperature source Oral, resp. rate 20, height 6\' 1"  (1.854 m), weight 95.709 kg (211 lb), SpO2 99 %.Body mass index is 27.84 kg/(m^2).  General Appearance: Casual and Fairly Groomed  Eye Contact::  Good  Speech:  Clear and Coherent and Normal Rate  Volume:  Normal  Mood:  Anxious and Depressed  Affect:  Depressed and Flat  Thought Process:  Coherent and Goal Directed  Orientation:  Full (Time, Place, and Person)  Thought Content:  symptoms events worries concerns  Suicidal Thoughts:  No  Homicidal Thoughts:  No  Memory:  Immediate;   Fair Recent;   Fair Remote;   Fair  Judgement:  Fair  Insight:  Present  Psychomotor Activity:  Normal and Restlessness  Concentration:  Fair  Recall:  [employer] of Knowledge:Fair  Language: Fair  Akathisia:  No  Handed:  Right  AIMS (if indicated):     Assets:  Desire for  Improvement Housing  ADL's:  Intact  Cognition: WNL  Sleep:  Number of Hours: 6.75   Treatment Plan Summary: Daily contact with patient to assess and evaluate symptoms and progress in treatment and Medication management Supportive approach/coping skills  Alcohol dependence continued vistaril 25mg  P.O  Q 6 Protocol,; continue Ativan detox protocol/work a relapse prevention plan Depression; start Lexapro 10 mg daily Insomnia; will D/C the Trazodone and try Remeron 15 mg HS  Will work with CBT/midfulness  Explore need to go in a residential treatment program;   Oneta Rack FNP- [facility] 06/05/2015, 5:49 PM

## 2015-06-05 NOTE — Plan of Care (Signed)
Problem: Alteration in mood & ability to function due to Goal: LTG-Pt reports reduction in suicidal thoughts (Patient reports reduction in suicidal thoughts and is able to verbalize a safety plan for whenever patient is feeling suicidal)  Outcome: Progressing Pt denies si.

## 2015-06-05 NOTE — Progress Notes (Signed)
Patient did attend the evening speaker AA meeting.  

## 2015-06-05 NOTE — Progress Notes (Signed)
D:Pt reports withdrawal symptoms during day shift. He rates anxiety as a 9 and depression as an 8 on 0-10 scale with 10 being the most.  A:Offered support, encouragement and 15 minute checks. Changed bandage on pt's lt big toe. No oozing at this time. Gave medication for rash and gave prn medications for withdrawal.  R:Pt denies si and hi. Safety maintained on the unit.

## 2015-06-05 NOTE — Progress Notes (Signed)
[facility] Group Notes:  (Nursing/MHT/Case Management/Adjunct)  Date:  06/05/2015  Time:  0930 Type of Therapy:  Nurse Education  Participation Level:  Active  Participation Quality:  Appropriate and Sharing  Affect:  Depressed  Cognitive:  Alert and Appropriate  Insight:  Improving  Engagement in Group:  Engaged  Modes of Intervention:  Discussion, Education, Socialization and Support  Summary of Progress/Problems:Pt's goal for today is to work on his depression, anxiety and see some of his family today.  Beatrix ShipperWright, Fahmida Jurich Martin 06/05/2015, 4:27 PM

## 2015-06-05 NOTE — Progress Notes (Signed)
D.  Pt pleasant on approach, denies complaints at this time other than continued sleep disturbance and continued tremors.   Pt was positive for evening AA group, interacting appropriately with peers on the unit.  Denies SI/HI/hallucinations at this time.  A.  Support and encouragement offered.  Medications given as ordered  R.  Pt remains safe on the unit, will continue to monitor.

## 2015-06-05 NOTE — [facility] Group Notes (Signed)
[facility] Group Notes:  (Clinical Social Work)   05/29/2015     10:00-11:00AM  Summary of Progress/Problems:   In today's process group a decisional balance exercise was used to explore in depth the perceived benefits and costs of alcohol and drugs, as well as the  benefits and costs of replacing these with healthy coping skills.  Patients listed healthy and unhealthy coping techniques, determining with CSW guidance that unhealthy coping techniques work initially, but eventually become harmful.  Motivational Interviewing and the whiteboard were utilized for the exercises.  The patient expressed that the unhealthy coping he often uses is isolation and said he also places guilt on himself for having too much time on his hands and therefore often choosing and doing the wrong things.  Type of Therapy:  Group Therapy - Process   Participation Level:  Active  Participation Quality:  Attentive  Affect:  Flat  Cognitive:  Appropriate  Insight:  Developing/Improving  Engagement in Therapy:  Engaged  Modes of Intervention:  Education, Motivational Interviewing  Roberto MantleMareida Grossman-Orr, LCSW 06/05/2015, 12:42 PM

## 2015-06-06 MED ORDER — ADULT MULTIVITAMIN W/MINERALS CH: 1.0000 | ORAL_TABLET | Freq: Every day | ORAL | Status: DC

## 2015-06-06 MED ORDER — ALBUTEROL SULFATE (2.5 MG/3ML) 0.083% IN NEBU: 2.5000 mg | INHALATION_SOLUTION | Freq: Four times a day (QID) | RESPIRATORY_TRACT | Status: DC | PRN

## 2015-06-06 MED ORDER — ESCITALOPRAM OXALATE 5 MG PO TABS: 5.0000 mg | ORAL_TABLET | Freq: Every day | ORAL | Status: DC

## 2015-06-06 MED ORDER — MIRTAZAPINE 15 MG PO TABS: 15.0000 mg | ORAL_TABLET | Freq: Every day | ORAL | Status: DC

## 2015-06-06 MED ORDER — PANTOPRAZOLE SODIUM 40 MG PO TBEC: 40.0000 mg | DELAYED_RELEASE_TABLET | Freq: Every day | ORAL | Status: DC

## 2015-06-06 MED ORDER — THIAMINE HCL 100 MG PO TABS: 100.0000 mg | ORAL_TABLET | Freq: Every day | ORAL | Status: DC

## 2015-06-06 NOTE — Progress Notes (Addendum)
[facility]  06/06/2015 11:40 AM Roberto [name]  MRN:  [id number]   ON Evaluation:   Roberto [name] is awake and alert. Attending and participating in with group session.  patient has concerns of increased anxiety and shakiness and is requesting "Xanax" or "Morphine" which has help him in the past . states he had a goodnight rest on last night due to night mediations was increased. States is Depression 6/10 and is still ruminating on his past behaviors and unsteadiness. Patient reports he feels better today and would like to be discharged soon. Feels he has made progress while inpatent. Reports he is going to find a ne support person as the one he has is addicted to alcohol. Patient denies suicidal and homicidal ideation. Support, encouragement and reassurance provided.   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 Alcohol dependence (HCC) [F10.20] 06/02/2015  . Acute encephalopathy [G93.40] 09/20/2014  . Alcoholic ketoacidosis [E87.2] 40/98/119102/21/2016  . Delirium tremens (HCC) [F10.231] 09/19/2014  . Hypokalemia [E87.6] 05/20/2012  . Acute respiratory failure (HCC) [J96.00] 05/19/2012  . Severe protein-calorie malnutrition (HCC) [E43] 05/19/2012  . Sepsis (HCC) [A41.9] 05/19/2012  . Alcohol withdrawal (HCC) [F10.239] 05/18/2012  . Encephalopathy [G93.40] 05/18/2012  . Dyspnea [R06.00] 05/18/2012  . Ascites [R18.8] 05/18/2012  . Volume overload [E87.70] 05/18/2012  . Helicobacter pylori antibody positive [R76.0] 12/19/2011  . Adynamic ileus (HCC) [K56.0] 12/18/2011  . 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 Hx of substance abuse [Z87.898] 12/17/2011  . Cirrhosis (HCC) [K74.60] 12/16/2011  . Abdominal pain, diffuse [R10.9] 12/15/2011  .  Thrombocytopenia (HCC) [D69.6] 12/15/2011  . Duodenal ulcer with hemorrhage [K26.4] 12/15/2011  . Esophageal varices without bleeding (HCC) [I85.00] 12/15/2011  . Hepatic steatosis [K76.0] 12/14/2011  . Compression fracture of L1 lumbar vertebra (HCC) [S32.010A] 12/14/2011  . Hypocalcemia [E83.51] 12/14/2011  . Acute renal failure (HCC) [N17.9] 12/14/2011  . Hypernatremia [E87.0] 12/14/2011  . Hypotension [458] 12/14/2011  . Hyperglycemia [R73.9] 12/14/2011  . Alcoholic hepatitis [K70.10] 12/14/2011  . GI bleed [K92.2] 12/13/2011  . Acute blood loss anemia [D62] 12/13/2011  . Alcohol abuse [F10.10] 12/13/2011  . Jaundice [R17] 12/13/2011  . Coagulopathy (HCC) [D68.9] 12/13/2011  . Macrocytic anemia [D53.9] 12/13/2011  . Renal insufficiency [N28.9] 12/13/2011  . Hepatic encephalopathy (HCC) [K72.90] 12/13/2011  . UTI (urinary tract infection) [N39.0] 12/13/2011  . Debility [R53.81] 12/13/2011  . Frequent falls [R29.6] 12/13/2011  . Pneumonia [J18.9] 08/17/2011  . HTN (hypertension) [I10] 08/17/2011  . COPD (chronic obstructive pulmonary disease) (HCC) [J44.9] 08/17/2011  . Smoker, current status unknown [Z72.0] 08/17/2011   Total Time spent with patient: 30 minutes  Past Psychiatric History: see admission H and P  Past Medical History:  Past Medical History  Diagnosis Date  . COPD (chronic obstructive pulmonary disease) (HCC)   . Hypertension   . Hepatic steatosis 12/14/2011  . Compression fracture of L1 lumbar vertebra (HCC) 12/14/2011  . C2 cervical fracture (HCC)   . Duodenal ulcer with hemorrhage 12/15/2011    Likely source of bleeding.per EGD; Dr. Karilyn Cotaehman  . Esophageal varices without bleeding (HCC) 12/15/2011    per EGD  . Cirrhosis (HCC) 12/16/2011    per ultrasound  . Hx of substance abuse 12/17/2011  . 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 Hepatic encephalopathy (HCC)   .  Helicobacter pylori antibody positive 12/19/2011  .  Encephalopathy 05/18/2012  . UTI (urinary tract infection) 12/13/2011  . Ascites 05/18/2012    S/p paracentesis yielding 7880 mL  . Cirrhosis (HCC)   . Depression     Past Surgical History  Procedure Laterality Date  . Coronary angioplasty with stent placement     Family History: History reviewed. No pertinent family history. Family Psychiatric  History: see admission H and P Social History:  History  Alcohol Use  . Yes    Comment: drinking everyday.     History  Drug Use  . Yes  . Special: Cocaine    Comment: prescription drugs/   says no cocaine in 1 month  12/03/2013    Social History   Social History  . Marital Status: Divorced    Spouse Name: N/A  . Number of Children: N/A  . Years of Education: N/A   Social History Main Topics  . Smoking status: Current Every Day Smoker -- 1.00 packs/day    Types: Cigarettes  . Smokeless tobacco: Current User     Comment: 1 1/2 pack a day  . Alcohol Use: Yes     Comment: drinking everyday.  . Drug Use: Yes    Special: Cocaine     Comment: prescription drugs/   says no cocaine in 1 month  12/03/2013  . Sexual Activity: Not Currently   Other Topics Concern  . None   Social History Narrative   Additional Social History:    Pain Medications: denies History of alcohol / drug use?: Yes Longest period of sobriety (when/how long): None  Negative Consequences of Use: Financial, Personal relationships Withdrawal Symptoms:  (muscle weakness, tremors) Name of Substance 1: Alcohol  1 - Age of First Use: 16 yoa 1 - Amount (size/oz): 12 pack or 4 40's 1 - Frequency: Daily  1 - Duration: On-going  1 - Last Use / Amount: 06/01/15 Name of Substance 2: Crack  2 - Age of First Use: 40's  2 - Amount (size/oz): $60 2 - Frequency: Wkly  2 - Duration: On-going  2 - Last Use / Amount: 1 Wk Ago         Sleep: Good  Appetite:  Good  Current Medications: Current Facility-Administered Medications  Medication Dose Route Frequency  Provider Last Rate Last Dose  . acetaminophen (TYLENOL) tablet 650 mg  650 mg Oral Q6H PRN Rachael Fee, MD      . albuterol (PROVENTIL HFA;VENTOLIN HFA) 108 (90 BASE) MCG/ACT inhaler 1-2 puff  1-2 puff Inhalation Q6H PRN Rachael Fee, MD   2 puff at 06/06/15 [phone]  . alum & mag hydroxide-simeth (MAALOX/MYLANTA) 200-200-20 MG/5ML suspension 30 mL  30 mL Oral Q4H PRN Rachael Fee, MD   30 mL at 06/04/15 2105  . escitalopram (LEXAPRO) tablet 5 mg  5 mg Oral Daily Rachael Fee, MD   5 mg at 06/06/15 0806  . hydrOXYzine (ATARAX/VISTARIL) tablet 25 mg  25 mg Oral Q6H PRN Oneta Rack, NP   25 mg at 06/05/15 2123  . Influenza vac split quadrivalent PF (FLUARIX) injection 0.5 mL  0.5 mL Intramuscular Tomorrow-1000 Rachael Fee, MD   0.5 mL at 06/04/15 1028  . loperamide (IMODIUM) capsule 2-4 mg  2-4 mg Oral PRN Oneta Rack, NP      . magnesium hydroxide (MILK OF MAGNESIA) suspension 30 mL  30 mL Oral Daily PRN Rachael Fee, MD      . mirtazapine (  REMERON) tablet 15 mg  15 mg Oral QHS Rachael Fee, MD   15 mg at 06/05/15 2123  . multivitamin with minerals tablet 1 tablet  1 tablet Oral Daily Rachael Fee, MD   1 tablet at 06/06/15 0805  . nicotine (NICODERM CQ - dosed in mg/24 hours) patch 21 mg  21 mg Transdermal Daily Kerry Hough, PA-C   21 mg at 06/06/15 4098  . nystatin (MYCOSTATIN/NYSTOP) topical powder   Topical TID Kerry Hough, PA-C      . pantoprazole (PROTONIX) EC tablet 40 mg  40 mg Oral Daily Rachael Fee, MD   40 mg at 06/06/15 0806  . pneumococcal 23 valent vaccine (PNU-IMMUNE) injection 0.5 mL  0.5 mL Intramuscular Tomorrow-1000 Rachael Fee, MD   0.5 mL at 06/04/15 1029  . thiamine (VITAMIN B-1) tablet 100 mg  100 mg Oral Daily Rachael Fee, MD   100 mg at 06/06/15 1191    Lab Results: No results found for this or any previous visit (from the past 48 hour(s)).  Physical Findings: AIMS: Facial and Oral Movements Muscles of Facial Expression: None, normal Lips and  Perioral Area: None, normal Jaw: None, normal Tongue: None, normal,Extremity Movements Upper (arms, wrists, hands, fingers): None, normal Lower (legs, knees, ankles, toes): None, normal, Trunk Movements Neck, shoulders, hips: None, normal, Overall Severity Severity of abnormal movements (highest score from questions above): None, normal Incapacitation due to abnormal movements: None, normal Patient's awareness of abnormal movements (rate only patient's report): No Awareness, Dental Status Current problems with teeth and/or dentures?: Yes Does patient usually wear dentures?: Yes  CIWA:  CIWA-Ar Total: 6 COWS:  COWS Total Score: 7  Musculoskeletal: Strength & Muscle Tone: within normal limits and decreased Gait & Station: broad based Patient leans: normal  Psychiatric Specialty Exam: Review of Systems  HENT: Negative.   Eyes: Negative.   Respiratory: Negative.   Cardiovascular: Negative.   Gastrointestinal: Negative.   Genitourinary: Negative.   Skin: Negative.   Endo/Heme/Allergies: Negative.   Psychiatric/Behavioral: Positive for depression. The patient is nervous/anxious and has insomnia.     Blood pressure 151/93, pulse 88, temperature 97.7 F (36.5 C), temperature source Oral, resp. rate 16, height 6\' 1"  (1.854 m), weight 95.709 kg (211 lb), SpO2 99 %.Body mass index is 27.84 kg/(m^2).  General Appearance: Casual and Fairly Groomed  Eye Contact::  Good  Speech:  Clear and Coherent and Normal Rate  Volume:  Normal  Mood:  Anxious and Depressed  Affect:  Depressed and Flat  Thought Process:  Coherent and Goal Directed  Orientation:  Full (Time, Place, and Person)  Thought Content:  symptoms events worries concerns  Suicidal Thoughts:  No  Homicidal Thoughts:  No  Memory:  Immediate;   Fair Recent;   Fair Remote;   Fair  Judgement:  Fair  Insight:  Present  Psychomotor Activity:  Normal and Restlessness  Concentration:  Fair  Recall:  [employer] of Knowledge:Fair   Language: Fair  Akathisia:  No  Handed:  Right  AIMS (if indicated):     Assets:  Desire for Improvement Housing  ADL's:  Intact  Cognition: WNL  Sleep:  Number of Hours: 6.5   I agree with current treatment plan Treatment Plan Summary: Daily contact with patient to assess and evaluate symptoms and progress in treatment and Medication management Supportive approach/coping skills Alcohol dependence continued vistaril 25mg  P.O  Q 6 Protocol,; continue Ativan detox protocol/work tapered a relapse prevention  plan Depression; start Lexapro 10 mg daily Insomnia; will D/C the Trazodone and try Remeron 15 mg HS Will work with CBT/midfulness  Explore need to go in a residential treatment program;   Oneta Rack FNP- [facility] 06/06/2015, 11:40 AM

## 2015-06-06 NOTE — Progress Notes (Signed)
Nursing Note 7-7p  D- Per pt's inventory sheet, appetite is fair, sleep has improved , rates hopelessness at a 5   a physical complains rash on buttocks putting powder on. Pt stated last evening had bloody nose, possiably related to HTN encouraged to check b/p when happens again.   A- Med's administered as per order. Emotional support and encouragement given.Group discussion included healthy support systems .  R-Contracts for safety and continues to follow treatment plan, working on learning new coping skills.

## 2015-06-06 NOTE — Progress Notes (Signed)
Patient did attend the evening speaker AA meeting.  

## 2015-06-06 NOTE — [facility] Group Notes (Signed)
[facility] Group Notes:  (Clinical Social Work)  06/06/2015  10:00-11:00AM  Summary of Progress/Problems:   The main focus of today's process group was to   1)  discuss the importance of adding supports  2)  define healthy supports versus unhealthy supports  3)  identify the patient's current level of supports and barriers to find more  4)  generate ideas for supports to add  An emphasis was placed on using counselor, doctor, therapy groups, 12-step groups, and problem-specific support groups to expand supports.    The patient expressed full comprehension of the concepts presented, and agreed that there is a need to add more supports.  The patient stated he has no transportation and so does not know how to expand his supports. The group recommended [employer] buses which he did not know about.  He is very open to working with a therapist to create a new schedule for himself.  Type of Therapy:  Process Group with Motivational Interviewing  Participation Level:  Active  Participation Quality:  Appropriate, Attentive and Sharing  Affect:  Anxious and Blunted  Cognitive:  Appropriate and Oriented  Insight:  Engaged  Engagement in Therapy:  Engaged  Modes of Intervention:   Education, Support and Processing, Activity  Ambrose MantleMareida Grossman-Orr, LCSW 06/06/2015

## 2015-06-06 NOTE — Progress Notes (Signed)
D.  Pt pleasant on approach, denies complaints at this time.  Positive for evening AA group, interacting appropriately with peers on the unit.  Denies SI/HI/hallucinations at this time.  A.  Support and encouragement offered  R.  Pt remains safe on the unit, will continue to monitor.   

## 2015-06-06 NOTE — [facility] Group Notes (Signed)
[facility] Group Notes:  (Nursing/MHT/Case Management/Adjunct)  Date:  06/06/2015  Time: 0930 am  Type of Therapy:  Nurse Education  Participation Level:  Minimal  Participation Quality:  Appropriate and Attentive  Affect:  Appropriate and Blunted  Cognitive:  Alert, Appropriate and Oriented  Insight:  Lacking  Engagement in Group:  Distracting and Off Topic  Modes of Intervention:  Discussion and Education  Summary of Progress/Problems: Group discussion on healthy support system, pt attempted to distract the group by saying " 2 beers are his support system" Reminded pt drinking was an unhealthy coping skills pt apologize for his behavior.    Jimmey Ralpherez, Alzada Brazee M 06/06/2015, 12:09 PM

## 2015-06-07 MED ORDER — ESCITALOPRAM OXALATE 10 MG PO TABS
10.0000 mg | ORAL_TABLET | Freq: Every day | ORAL | Status: DC
  Administered 2015-06-08 – 2015-06-10 (×3): 10 mg via ORAL
  Filled 2015-06-07 (×2): qty 1
  Filled 2015-06-07: qty 3
  Filled 2015-06-07 (×2): qty 1

## 2015-06-07 MED ORDER — GABAPENTIN 100 MG PO CAPS
100.0000 mg | ORAL_CAPSULE | Freq: Three times a day (TID) | ORAL | Status: DC
  Administered 2015-06-07 – 2015-06-09 (×6): 100 mg via ORAL
  Filled 2015-06-07 (×9): qty 1

## 2015-06-07 NOTE — Progress Notes (Signed)
Pt attended evening AA group. 

## 2015-06-07 NOTE — Progress Notes (Signed)
Pt attended spiritual care group on grief and loss facilitated by chaplain Burnis KingfisherMatthew Stalnaker and counseling intern [facility] Sehaj Mcenroe. Group opened with brief discussion and psycho-social ed around grief and loss in relationships and in relation to self - identifying life patterns, circumstances, changes that cause losses. Established group norm of speaking from own life experience. Group goal of establishing open and affirming space for members to share loss and experience with grief, normalize grief experience and provide psycho social education and grief support.   Pt was actively engaged during group and participated often. He was oriented and alert. Pt described feelings of loss surrounding the passing of his father when he was young, as well as anticipated losses such as the loss of his mother (physical death) and other relatives. We discussed various other types of losses, including the change in relationship that came with putting his mother in a nursing home as well as the change in his identity when he got divorced approximately 10 years ago. Pt discussed the metaphor of a "backpack" as it relates to grief; he discussed the importance of unpacking that backpack when it becomes too heavy.   Graciela HusbandsKathryn Markala Sitts Counseling Intern

## 2015-06-07 NOTE — Care Management Utilization Note (Signed)
   Per State Regulation 482.30  This chart was reviewed for necessity with respect to the patient's Admission/ Duration of stay.  Next review date: 06/11/15  Nicolasa Duckingrystal Morrison RN, BSN
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
## 2015-06-07 NOTE — Progress Notes (Signed)
[facility]  06/07/2015 5:40 PM Roberto [name]  MRN:  [id number] Subjective:  [name] states that he is still feeling anxious worried depressed. States he knows he needs to move forward and he has a lot of things waiting for him to take care of. He admits that where he is staying there is a lot of alcohol and drugs around. Would like to be a little stronger more stable when he gets out. He is worried of another relapse considering the status of his liver. 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 Alcohol dependence (HCC) [F10.20] 06/02/2015  . Acute encephalopathy [G93.40] 09/20/2014  . Alcoholic ketoacidosis [E87.2] 11/91/478202/21/2016  . Delirium tremens (HCC) [F10.231] 09/19/2014  . Hypokalemia [E87.6] 05/20/2012  . Acute respiratory failure (HCC) [J96.00] 05/19/2012  . Severe protein-calorie malnutrition (HCC) [E43] 05/19/2012  . Sepsis (HCC) [A41.9] 05/19/2012  . Alcohol withdrawal (HCC) [F10.239] 05/18/2012  . Encephalopathy [G93.40] 05/18/2012  . Dyspnea [R06.00] 05/18/2012  . Ascites [R18.8] 05/18/2012  . Volume overload [E87.70] 05/18/2012  . Helicobacter pylori antibody positive [R76.0] 12/19/2011  . Adynamic ileus (HCC) [K56.0] 12/18/2011  . 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 Hx of substance abuse [Z87.898] 12/17/2011  . Cirrhosis (HCC) [K74.60] 12/16/2011  . Abdominal pain, diffuse [R10.9] 12/15/2011  . Thrombocytopenia (HCC) [D69.6] 12/15/2011  . Duodenal ulcer with hemorrhage [K26.4] 12/15/2011  . Esophageal varices without bleeding (HCC) [I85.00] 12/15/2011  . Hepatic steatosis [K76.0] 12/14/2011  . Compression fracture of L1 lumbar vertebra (HCC) [S32.010A] 12/14/2011  . Hypocalcemia  [E83.51] 12/14/2011  . Acute renal failure (HCC) [N17.9] 12/14/2011  . Hypernatremia [E87.0] 12/14/2011  . Hypotension [458] 12/14/2011  . Hyperglycemia [R73.9] 12/14/2011  . Alcoholic hepatitis [K70.10] 12/14/2011  . GI bleed [K92.2] 12/13/2011  . Acute blood loss anemia [D62] 12/13/2011  . Alcohol abuse [F10.10] 12/13/2011  . Jaundice [R17] 12/13/2011  . Coagulopathy (HCC) [D68.9] 12/13/2011  . Macrocytic anemia [D53.9] 12/13/2011  . Renal insufficiency [N28.9] 12/13/2011  . Hepatic encephalopathy (HCC) [K72.90] 12/13/2011  . UTI (urinary tract infection) [N39.0] 12/13/2011  . Debility [R53.81] 12/13/2011  . Frequent falls [R29.6] 12/13/2011  . Pneumonia [J18.9] 08/17/2011  . HTN (hypertension) [I10] 08/17/2011  . COPD (chronic obstructive pulmonary disease) (HCC) [J44.9] 08/17/2011  . Smoker, current status unknown [Z72.0] 08/17/2011   Total Time spent with patient: 20 minutes  Past Psychiatric History: see admission H and P  Past Medical History:  Past Medical History  Diagnosis Date  . COPD (chronic obstructive pulmonary disease) (HCC)   . Hypertension   . Hepatic steatosis 12/14/2011  . Compression fracture of L1 lumbar vertebra (HCC) 12/14/2011  . C2 cervical fracture (HCC)   . Duodenal ulcer with hemorrhage 12/15/2011    Likely source of bleeding.per EGD; Dr. Karilyn Cotaehman  . Esophageal varices without bleeding (HCC) 12/15/2011    per EGD  . Cirrhosis (HCC) 12/16/2011    per ultrasound  . Hx of substance abuse 12/17/2011  . 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 Hepatic encephalopathy (HCC)   . Helicobacter pylori antibody positive 12/19/2011  . Encephalopathy 05/18/2012  . UTI (urinary tract infection) 12/13/2011  . Ascites 05/18/2012    S/p paracentesis yielding 7880 mL  . Cirrhosis (HCC)   . Depression  Past Surgical History  Procedure Laterality Date  . Coronary angioplasty with stent placement     Family History: History  reviewed. No pertinent family history. Family Psychiatric  History: see Admission H and P Social History:  History  Alcohol Use  . Yes    Comment: drinking everyday.     History  Drug Use  . Yes  . Special: Cocaine    Comment: prescription drugs/   says no cocaine in 1 month  12/03/2013    Social History   Social History  . Marital Status: Divorced    Spouse Name: N/A  . Number of Children: N/A  . Years of Education: N/A   Social History Main Topics  . Smoking status: Current Every Day Smoker -- 1.00 packs/day    Types: Cigarettes  . Smokeless tobacco: Current User     Comment: 1 1/2 pack a day  . Alcohol Use: Yes     Comment: drinking everyday.  . Drug Use: Yes    Special: Cocaine     Comment: prescription drugs/   says no cocaine in 1 month  12/03/2013  . Sexual Activity: Not Currently   Other Topics Concern  . None   Social History Narrative   Additional Social History:    Pain Medications: denies History of alcohol / drug use?: Yes Longest period of sobriety (when/how long): None  Negative Consequences of Use: Financial, Personal relationships Withdrawal Symptoms:  (muscle weakness, tremors) Name of Substance 1: Alcohol  1 - Age of First Use: 16 yoa 1 - Amount (size/oz): 12 pack or 4 40's 1 - Frequency: Daily  1 - Duration: On-going  1 - Last Use / Amount: 06/01/15 Name of Substance 2: Crack  2 - Age of First Use: 40's  2 - Amount (size/oz): $60 2 - Frequency: Wkly  2 - Duration: On-going  2 - Last Use / Amount: 1 Wk Ago                 Sleep: Fair  Appetite:  Fair  Current Medications: Current Facility-Administered Medications  Medication Dose Route Frequency Provider Last Rate Last Dose  . acetaminophen (TYLENOL) tablet 650 mg  650 mg Oral Q6H PRN Rachael Fee, MD      . albuterol (PROVENTIL HFA;VENTOLIN HFA) 108 (90 BASE) MCG/ACT inhaler 1-2 puff  1-2 puff Inhalation Q6H PRN Rachael Fee, MD   2 puff at 06/07/15 1337  . alum & mag  hydroxide-simeth (MAALOX/MYLANTA) 200-200-20 MG/5ML suspension 30 mL  30 mL Oral Q4H PRN Rachael Fee, MD   30 mL at 06/07/15 1337  . [START ON 06/08/2015] escitalopram (LEXAPRO) tablet 10 mg  10 mg Oral Daily Rachael Fee, MD      . gabapentin (NEURONTIN) capsule 100 mg  100 mg Oral TID Rachael Fee, MD   100 mg at 06/07/15 1708  . hydrOXYzine (ATARAX/VISTARIL) tablet 25 mg  25 mg Oral Q6H PRN Oneta Rack, NP   25 mg at 06/07/15 0801  . Influenza vac split quadrivalent PF (FLUARIX) injection 0.5 mL  0.5 mL Intramuscular Tomorrow-1000 Rachael Fee, MD   0.5 mL at 06/04/15 1028  . loperamide (IMODIUM) capsule 2-4 mg  2-4 mg Oral PRN Oneta Rack, NP      . magnesium hydroxide (MILK OF MAGNESIA) suspension 30 mL  30 mL Oral Daily PRN Rachael Fee, MD      . mirtazapine (REMERON) tablet 15 mg  15 mg Oral QHS  Rachael Fee, MD   15 mg at 06/06/15 2128  . multivitamin with minerals tablet 1 tablet  1 tablet Oral Daily Rachael Fee, MD   1 tablet at 06/07/15 0759  . nicotine (NICODERM CQ - dosed in mg/24 hours) patch 21 mg  21 mg Transdermal Daily Kerry Hough, PA-C   21 mg at 06/07/15 0805  . nystatin (MYCOSTATIN/NYSTOP) topical powder   Topical TID Kerry Hough, PA-C      . pantoprazole (PROTONIX) EC tablet 40 mg  40 mg Oral Daily Rachael Fee, MD   40 mg at 06/07/15 0759  . pneumococcal 23 valent vaccine (PNU-IMMUNE) injection 0.5 mL  0.5 mL Intramuscular Tomorrow-1000 Rachael Fee, MD   0.5 mL at 06/04/15 1029  . thiamine (VITAMIN B-1) tablet 100 mg  100 mg Oral Daily Rachael Fee, MD   100 mg at 06/07/15 1610    Lab Results: No results found for this or any previous visit (from the past 48 hour(s)).  Physical Findings: AIMS: Facial and Oral Movements Muscles of Facial Expression: None, normal Lips and Perioral Area: None, normal Jaw: None, normal Tongue: None, normal,Extremity Movements Upper (arms, wrists, hands, fingers): None, normal Lower (legs, knees, ankles, toes):  None, normal, Trunk Movements Neck, shoulders, hips: None, normal, Overall Severity Severity of abnormal movements (highest score from questions above): None, normal Incapacitation due to abnormal movements: None, normal Patient's awareness of abnormal movements (rate only patient's report): No Awareness, Dental Status Current problems with teeth and/or dentures?: Yes Does patient usually wear dentures?: Yes  CIWA:  CIWA-Ar Total: 3 COWS:  COWS Total Score: 7  Musculoskeletal: Strength & Muscle Tone: within normal limits Gait & Station: normal Patient leans: normal  Psychiatric Specialty Exam: Review of Systems  Constitutional: Negative.   HENT: Negative.   Eyes: Negative.   Respiratory: Negative.   Cardiovascular: Negative.   Gastrointestinal: Negative.   Genitourinary: Negative.   Musculoskeletal: Negative.   Skin: Negative.   Neurological: Negative.   Endo/Heme/Allergies: Negative.   Psychiatric/Behavioral: Positive for depression and substance abuse. The patient is nervous/anxious.     Blood pressure 151/79, pulse 68, temperature 98.1 F (36.7 C), temperature source Oral, resp. rate 16, height  (1.854 m), weight 95.709 kg (211 lb), SpO2 99 %.Body mass index is 27.84 kg/(m^2).  General Appearance: Fairly Groomed  Patent attorney::  Fair  Speech:  Clear and Coherent  Volume:  fluctuates  Mood:  Anxious, Depressed and worried  Affect:  Depressed and anxious worried  Thought Process:  Coherent and Goal Directed  Orientation:  Full (Time, Place, and Person)  Thought Content:  symptoms events worries concerns  Suicidal Thoughts:  No  Homicidal Thoughts:  No  Memory:  Immediate;   Fair Recent;   Fair Remote;   Fair  Judgement:  Fair  Insight:  Present and Shallow  Psychomotor Activity:  Restlessness  Concentration:  Fair  Recall:  [employer] of Knowledge:Fair  Language: Fair  Akathisia:  No  Handed:  Right  AIMS (if indicated):     Assets:  Desire for  Improvement Housing Social Support  ADL's:  Intact  Cognition: WNL  Sleep:  Number of Hours: 6   Treatment Plan Summary: Daily contact with patient to assess and evaluate symptoms and progress in treatment and Medication management Supportive approach/coping skills Alcohol dependence; work a relapse prevention plan Cocaine abuse; work a relapse prevention plan Insomnia; continue the Remeron 15 ( has slept better on it)  Depression; increase the Lexapro to 10 mg daily Anxiety; will start Neurontin 100 mg TID and optimize response Use CBT/mindfulness Will ask again to consider going to a residential treatment program Kaiyden Simkin A 06/07/2015, 5:40 PM

## 2015-06-07 NOTE — Plan of Care (Signed)
Problem: Alteration in mood Goal: LTG-Patient reports reduction in suicidal thoughts (Patient reports reduction in suicidal thoughts and is able to verbalize a safety plan for whenever patient is feeling suicidal)  Outcome: Progressing Patient denies suicidal thoughts today.  Problem: Diagnosis: Increased Risk For Suicide Attempt Goal: LTG-Patient Will Report Improved Mood and Deny Suicidal LTG (by discharge) Patient will report improved mood and deny suicidal ideation.  Outcome: Progressing Patient denies suicidal thoughts.

## 2015-06-07 NOTE — Progress Notes (Signed)
Recreation Therapy Notes  Date: 11.07.2016 Time: 9:30am Location: [address] Dayroom  Group Topic: Stress Management  Goal Area(s) Addresses:  Patient will actively participate in stress management techniques presented during session.   Behavioral Response: Did not attend.   Criss Pallone L Leotha Westermeyer, LRT/CTRS        Ariez Neilan L 06/07/2015 10:22 AM 

## 2015-06-07 NOTE — Plan of Care (Signed)
Problem: Diagnosis: Increased Risk For Suicide Attempt Goal: STG-Patient Will Attend All Groups On The Unit Outcome: Progressing Pt has attended evening AA group Saturday and Sunday

## 2015-06-08 NOTE — Progress Notes (Signed)
[facility] MD Progress Note  06/08/2015 5:09 PM Roberto [name]  MRN:  [id number] Subjective:  Roberto [name] is anticipating going home tomorrow. He is still dealing with some anxiety, worry. States he has to get himself to be active and involved again. States that he is afraid that he will lose his family support if he was to relapse again. States he does not want to burn any bridges. He is worried as he is also going to the same environment surrounded by drugs and alcohol. He admits he will have to make more of an effort. 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 Alcohol dependence (HCC) [F10.20] 06/02/2015  . Acute encephalopathy [G93.40] 09/20/2014  . Alcoholic ketoacidosis [E87.2] 95/62/1308  . Delirium tremens (HCC) [F10.231] 09/19/2014  . Hypokalemia [E87.6] 05/20/2012  . Acute respiratory failure (HCC) [J96.00] 05/19/2012  . Severe protein-calorie malnutrition (HCC) [E43] 05/19/2012  . Sepsis (HCC) [A41.9] 05/19/2012  . Alcohol withdrawal (HCC) [F10.239] 05/18/2012  . Encephalopathy [G93.40] 05/18/2012  . Dyspnea [R06.00] 05/18/2012  . Ascites [R18.8] 05/18/2012  . Volume overload [E87.70] 05/18/2012  . Helicobacter pylori antibody positive [R76.0] 12/19/2011  . Adynamic ileus (HCC) [K56.0] 12/18/2011  . 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 Hx of substance abuse [Z87.898] 12/17/2011  . Cirrhosis (HCC) [K74.60] 12/16/2011  . Abdominal pain, diffuse [R10.9] 12/15/2011  . Thrombocytopenia (HCC) [D69.6] 12/15/2011  . Duodenal ulcer with hemorrhage [K26.4] 12/15/2011  . Esophageal varices without bleeding  (HCC) [I85.00] 12/15/2011  . Hepatic steatosis [K76.0] 12/14/2011  . Compression fracture of L1 lumbar vertebra (HCC) [S32.010A] 12/14/2011  . Hypocalcemia [E83.51] 12/14/2011  . Acute renal failure (HCC) [N17.9] 12/14/2011  . Hypernatremia [E87.0] 12/14/2011  . Hypotension [458] 12/14/2011  . Hyperglycemia [R73.9] 12/14/2011  . Alcoholic hepatitis [K70.10] 12/14/2011  . GI bleed [K92.2] 12/13/2011  . Acute blood loss anemia [D62] 12/13/2011  . Alcohol abuse [F10.10] 12/13/2011  . Jaundice [R17] 12/13/2011  . Coagulopathy (HCC) [D68.9] 12/13/2011  . Macrocytic anemia [D53.9] 12/13/2011  . Renal insufficiency [N28.9] 12/13/2011  . Hepatic encephalopathy (HCC) [K72.90] 12/13/2011  . UTI (urinary tract infection) [N39.0] 12/13/2011  . Debility [R53.81] 12/13/2011  . Frequent falls [R29.6] 12/13/2011  . Pneumonia [J18.9] 08/17/2011  . HTN (hypertension) [I10] 08/17/2011  . COPD (chronic obstructive pulmonary disease) (HCC) [J44.9] 08/17/2011  . Smoker, current status unknown [Z72.0] 08/17/2011   Total Time spent with patient: 30 minutes  Past Psychiatric History: see admission H and P  Past Medical History:  Past Medical History  Diagnosis Date  . COPD (chronic obstructive pulmonary disease) (HCC)   . Hypertension   . Hepatic steatosis 12/14/2011  . Compression fracture of L1 lumbar vertebra (HCC) 12/14/2011  . C2 cervical fracture (HCC)   . Duodenal ulcer with hemorrhage 12/15/2011    Likely source of bleeding.per EGD; Dr. Karilyn Cota  . Esophageal varices without bleeding (HCC) 12/15/2011    per EGD  . Cirrhosis (HCC) 12/16/2011    per ultrasound  . Hx of substance abuse 12/17/2011  . 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 Hepatic encephalopathy (HCC)   . Helicobacter pylori antibody positive 12/19/2011  .  Encephalopathy 05/18/2012  . UTI (urinary tract infection) 12/13/2011  . Ascites 05/18/2012    S/p paracentesis yielding 7880 mL  .  Cirrhosis (HCC)   . Depression     Past Surgical History  Procedure Laterality Date  . Coronary angioplasty with stent placement     Family History: History reviewed. No pertinent family history. Family Psychiatric  History: see admission H and P Social History:  History  Alcohol Use  . Yes    Comment: drinking everyday.     History  Drug Use  . Yes  . Special: Cocaine    Comment: prescription drugs/   says no cocaine in 1 month  12/03/2013    Social History   Social History  . Marital Status: Divorced    Spouse Name: N/A  . Number of Children: N/A  . Years of Education: N/A   Social History Main Topics  . Smoking status: Current Every Day Smoker -- 1.00 packs/day    Types: Cigarettes  . Smokeless tobacco: Current User     Comment: 1 1/2 pack a day  . Alcohol Use: Yes     Comment: drinking everyday.  . Drug Use: Yes    Special: Cocaine     Comment: prescription drugs/   says no cocaine in 1 month  12/03/2013  . Sexual Activity: Not Currently   Other Topics Concern  . None   Social History Narrative   Additional Social History:    Pain Medications: denies History of alcohol / drug use?: Yes Longest period of sobriety (when/how long): None  Negative Consequences of Use: Financial, Personal relationships Withdrawal Symptoms:  (muscle weakness, tremors) Name of Substance 1: Alcohol  1 - Age of First Use: 16 yoa 1 - Amount (size/oz): 12 pack or 4 40's 1 - Frequency: Daily  1 - Duration: On-going  1 - Last Use / Amount: 06/01/15 Name of Substance 2: Crack  2 - Age of First Use: 40's  2 - Amount (size/oz): $60 2 - Frequency: Wkly  2 - Duration: On-going  2 - Last Use / Amount: 1 Wk Ago                 Sleep: Fair  Appetite:  Fair  Current Medications: Current Facility-Administered Medications  Medication Dose Route Frequency Provider Last Rate Last Dose  . acetaminophen (TYLENOL) tablet 650 mg  650 mg Oral Q6H PRN Rachael FeeIrving A Shantanique Hodo, MD      .  albuterol (PROVENTIL HFA;VENTOLIN HFA) 108 (90 BASE) MCG/ACT inhaler 1-2 puff  1-2 puff Inhalation Q6H PRN Rachael FeeIrving A Lulu Hirschmann, MD   2 puff at 06/08/15 [phone]  . alum & mag hydroxide-simeth (MAALOX/MYLANTA) 200-200-20 MG/5ML suspension 30 mL  30 mL Oral Q4H PRN Rachael FeeIrving A Naja Apperson, MD   30 mL at 06/07/15 1337  . escitalopram (LEXAPRO) tablet 10 mg  10 mg Oral Daily Rachael FeeIrving A Lee-Anne Flicker, MD   10 mg at 06/08/15 0810  . gabapentin (NEURONTIN) capsule 100 mg  100 mg Oral TID Rachael FeeIrving A Evalette Montrose, MD   100 mg at 06/08/15 1306  . Influenza vac split quadrivalent PF (FLUARIX) injection 0.5 mL  0.5 mL Intramuscular Tomorrow-1000 Rachael FeeIrving A Shonda Mandarino, MD   0.5 mL at 06/04/15 1028  . magnesium hydroxide (MILK OF MAGNESIA) suspension 30 mL  30 mL Oral Daily PRN Rachael FeeIrving A Mukesh Kornegay, MD      . mirtazapine (REMERON) tablet 15 mg  15 mg Oral QHS Rachael FeeIrving A Ocie Stanzione, MD   15 mg at 06/07/15 2123  .  multivitamin with minerals tablet 1 tablet  1 tablet Oral Daily Rachael Fee, MD   1 tablet at 06/08/15 0810  . nicotine (NICODERM CQ - dosed in mg/24 hours) patch 21 mg  21 mg Transdermal Daily Kerry Hough, PA-C   21 mg at 06/08/15 1610  . nystatin (MYCOSTATIN/NYSTOP) topical powder   Topical TID Kerry Hough, PA-C      . pantoprazole (PROTONIX) EC tablet 40 mg  40 mg Oral Daily Rachael Fee, MD   40 mg at 06/08/15 0811  . pneumococcal 23 valent vaccine (PNU-IMMUNE) injection 0.5 mL  0.5 mL Intramuscular Tomorrow-1000 Rachael Fee, MD   0.5 mL at 06/04/15 1029  . thiamine (VITAMIN B-1) tablet 100 mg  100 mg Oral Daily Rachael Fee, MD   100 mg at 06/08/15 9604    Lab Results: No results found for this or any previous visit (from the past 48 hour(s)).  Physical Findings: AIMS: Facial and Oral Movements Muscles of Facial Expression: None, normal Lips and Perioral Area: None, normal Jaw: None, normal Tongue: None, normal,Extremity Movements Upper (arms, wrists, hands, fingers): None, normal Lower (legs, knees, ankles, toes): None, normal, Trunk  Movements Neck, shoulders, hips: None, normal, Overall Severity Severity of abnormal movements (highest score from questions above): None, normal Incapacitation due to abnormal movements: None, normal Patient's awareness of abnormal movements (rate only patient's report): No Awareness, Dental Status Current problems with teeth and/or dentures?: Yes Does patient usually wear dentures?: Yes  CIWA:  CIWA-Ar Total: 1 COWS:  COWS Total Score: 2  Musculoskeletal: Strength & Muscle Tone: within normal limits Gait & Station: normal Patient leans: normal  Psychiatric Specialty Exam: Review of Systems  Constitutional: Negative.   HENT: Negative.   Eyes: Negative.   Respiratory: Negative.   Cardiovascular: Negative.   Gastrointestinal: Negative.   Genitourinary: Negative.   Musculoskeletal: Negative.   Skin: Negative.   Neurological: Negative.   Endo/Heme/Allergies: Negative.   Psychiatric/Behavioral: Positive for depression and substance abuse. The patient is nervous/anxious.     Blood pressure 133/91, pulse 86, temperature 97.4 F (36.3 C), temperature source Oral, resp. rate 16, height  (1.854 m), weight 95.709 kg (211 lb), SpO2 99 %.Body mass index is 27.84 kg/(m^2).  General Appearance: Fairly Groomed  Patent attorney::  Fair  Speech:  Clear and Coherent  Volume:  Normal  Mood:  worried  Affect:  Appropriate  Thought Process:  Coherent and Goal Directed  Orientation:  Full (Time, Place, and Person)  Thought Content:  symptoms events worries concerns dealing with a lot of shame and guilt for his relapse  Suicidal Thoughts:  No  Homicidal Thoughts:  No  Memory:  Immediate;   Fair Recent;   Fair Remote;   Fair  Judgement:  Fair  Insight:  Present  Psychomotor Activity:  Normal  Concentration:  Fair  Recall:  [employer] of Knowledge:Fair  Language: Fair  Akathisia:  No  Handed:  Right  AIMS (if indicated):     Assets:  Desire for Improvement Housing Social Support   ADL's:  Intact  Cognition: WNL  Sleep:  Number of Hours: 6   Treatment Plan Summary: Daily contact with patient to assess and evaluate symptoms and progress in treatment and Medication management Supportive approach/coping skills Alcohol dependence/cocaine abuse; continue to work a relapse prevention plan Depression; continue the Lexapro 10 mg daily Anxiety; continue to work with the Neurontin 100 mg TID Insomnia; continue to work with the Remeron 15  mg HS Work with CBT/mindfulness Meckenzie Balsley A 06/08/2015, 5:09 PM

## 2015-06-08 NOTE — Progress Notes (Signed)
Recreation Therapy Notes  Animal-Assisted Activity (AAA) Program Checklist/Progress Notes Patient Eligibility Criteria Checklist & Daily Group note for Rec Tx Intervention  Date: 11.08.2016 Time: 2:45pm Location: 300 [address] Dayroom    AAA/T Program Assumption of Risk Form signed by Patient/ or Parent Legal Guardian yes  Patient is free of allergies or sever asthma yes  Patient reports no fear of animals yes  Patient reports no history of cruelty to animals yes  Patient understands his/her participation is voluntary yes  Patient washes hands before animal contact yes  Patient washes hands after animal contact yes  Behavioral Response: Appropriate   Education: Hand Washing, Appropriate Animal Interaction   Education Outcome: Acknowledges education.   Clinical Observations/Feedback: Patient engaged appropriately with therapy dog and peers during session.    Alishah Schulte L Idona Stach, LRT/CTRS        Lynia Landry L 06/08/2015 3:10 PM 

## 2015-06-08 NOTE — Progress Notes (Signed)
D: Pt is alert and oriented x4. Pt complained of mild anxiety. He states, "This afternoon, I was very anxious; I was given some pill and it has worked real good." Pt also complained of moderate mild H/A. Pt however denies anxiety, SI, HI and AVH. Pt was calm and cooperative through the assessment.  A: Pt was encouraged to attend group. Medications offered as prescribed.  Support, encouragement, and safe environment provided.  15-minute safety checks continue.  R: Pt was med compliant.  Pt attended group. Safety checks continue

## 2015-06-08 NOTE — [facility] Group Notes (Signed)
[facility] LCSW Group Therapy  06/08/2015   1:15 PM   Type of Therapy:  Group Therapy  Participation Level:  Active  Participation Quality:  Attentive, Sharing and Supportive  Affect:  Appropriate  Cognitive:  Alert and Oriented  Insight:  Developing/Improving and Engaged  Engagement in Therapy:  Developing/Improving and Engaged  Modes of Intervention:  Clarification, Confrontation, Discussion, Education, Exploration, Limit-setting, Orientation, Problem-solving, Rapport Building, Dance movement psychotherapisteality Testing, Socialization and Support  Summary of Progress/Problems: The topic for group therapy was feelings about diagnosis.  Pt actively participated in group discussion on their past and current diagnosis and how they feel towards this.  Pt also identified how society and family members judge them, based on their diagnosis as well as stereotypes and stigmas.  Patient engaged in discussion of his illness and how it affects his relationships with his family. Patient verbalized the importance of his family in his life and reports that he is worried to lose those relationships. CSWs and other group members provided patient with emotional support and encouragement.  Samuella BruinKristin Mauri Temkin, MSW, [employer]CSWA Clinical Social Worker [facility] [phone]

## 2015-06-08 NOTE — Plan of Care (Signed)
Problem: Alteration in mood Goal: LTG-Patient reports reduction in suicidal thoughts (Patient reports reduction in suicidal thoughts and is able to verbalize a safety plan for whenever patient is feeling suicidal)  Outcome: Progressing Nurse discussed depression/coping skills with patient.        

## 2015-06-08 NOTE — [facility] Group Notes (Addendum)

## 2015-06-08 NOTE — Progress Notes (Addendum)
D:  Patient's self inventory sheet, patient has fair sleep, sleep medication is helpful.  Good appetite, normal energy level, good concentration.  Rated depression 4, hopeless 6, anxiety 8.  Withdrawals are cramping, agitation.  Denied SI.  Has experienced lightheadedness and blurred vision in past 24 hours.  Has experienced back pain in past 24 hrs.  Goal is to discharge.  No discharge plans.  No problems anticipated after discharge. A:  Medications administered per MD orders.  Emotional support and encouragement given patient. R:  Denied SI and HI, contracts for safety.  Denied a/V hallucinations.  Safety maintained with 15 minute checks. Patient plans to be discharged tomorrow Wednesday.

## 2015-06-08 NOTE — Progress Notes (Signed)
Pt attended evening AA group. 

## 2015-06-09 MED ORDER — LORAZEPAM 1 MG PO TABS: 1.0000 mg | ORAL_TABLET | Freq: Four times a day (QID) | ORAL | Status: DC | PRN

## 2015-06-09 NOTE — Progress Notes (Signed)
Recreation Therapy Notes  Date: 06/09/15 Time: 930 Location: 300 Hall Dayroom  Group Topic: Stress Management  Goal Area(s) Addresses:  Patient will verbalize importance of using healthy stress management.  Patient will identify positive emotions associated with healthy stress management.   Behavioral Response: Engaged  Intervention: Stress Management  Activity :  Progressive Muscle Relaxation.  LRT introduced and educated patients on stress management technique of progressive muscle relaxation.  A script was used to deliver the technique to patients and patients were asked to follow script read allowed by LRT to engage in practicing the stress management technique.    Education:  Stress Management, Discharge Planning.   Education Outcome: Acknowledges edcuation/In group clarification offered/Needs additional education  Clinical Observations/Feedback:  Patient attended group.    Caroll RancherMarjette Rayley Gao, LRT/CTRS  Caroll RancherLindsay, Jaylea Plourde A 06/09/2015 3:47 PM

## 2015-06-09 NOTE — Progress Notes (Signed)
Pt stated that he had a great day overall.

## 2015-06-09 NOTE — Progress Notes (Signed)
D: Pt presents anxious in affect and mood. Pt reports that he is experiencing pauses in his speech. This was also noted during our interaction. Per pt, this has been occuring throughout the day. Pt was encouraged to speak with the psychiatrist in regards to such. Pt verbalized a lack of energy this afternoon. Pt was informed that it may be his body adjusting to the Neurontin. Writer encouraged pt to not make sudden changes in movement due to possible lightheadedness r/t Neurontin.Pt reports having no PCP or psychiatrist. Pt was encouraged to not abruptly stop any of his medications upon being discharged. Pt plans to speak with his attending psychiatrist accordingly. This pt was visible and active within the milieu.Pt was negative for any SI/HI/AVH. Pt was hypertensive with no associated symptoms. Pt reports being non-compliant with his past antihypertensives. A: Writer administered scheduled and prn medications to pt, per MD orders. Continued support and availability as needed was extended to this pt. Staff continue to monitor pt with q4615min checks.  R: No adverse drug reactions noted. Pt receptive to treatment. Pt remains safe at this time.

## 2015-06-09 NOTE — Progress Notes (Signed)
  [facility]  Will you be returning to the same living situation after discharge:  Yes,  Patient plans to return to previous living situation. At discharge, do you have transportation home?: Yes,  Patient requesting transportation back to [facility] where his brother will pick him up. Do you have the ability to pay for your medications: Yes,  Patient will be provided with prescriptions at discharge.  Release of information consent forms completed and in the chart;  Patient's signature needed at discharge.  Patient to Follow up at: Follow-up Information    [facility].   Why:  Walk in clinic on Monday Nov. 14th between 8am to 11am for assessment for therapy and medication management services. Bring ID, social security card, insurance card, copay, and proof of insurance. Call if you need to reschedule.   Contact information:   [address] [place], [place] [address] Phone: [phone]      Next level of care provider has access to [facility] Link:no  Patient denies SI/HI: Yes,  Denies.    Safety Planning and Suicide Prevention discussed: Yes,  with patient.  Have you used any form of tobacco in the last 30 days? (Cigarettes, Smokeless Tobacco, Cigars, and/or Pipes): Yes  Has patient been referred to the Quitline?: Patient refused referral  Rolly Magri, [place]boKristin L 06/09/2015, 10:40 AM

## 2015-06-09 NOTE — Progress Notes (Signed)
D:Per patient self inventory form pt reports he slept fair with the use of sleep medication. He reports a good appetite, low energy level, good concentration. He rates depression 3/10, hopelessness 4/10, anxiety 4/10- all on 0-10 scale, 10 being the worse.  Pt denies SI/HI. Denies AVH. Pt reports his goal today "meds side effects" and that he will "talk to the doctor" to help meet his goal. Pt expresses to this nurse that he is not ready to be d/c and is complaining of "glitch in speech" Pt behavior pleasant on approach.  A:Special checks q 15 mins in place for safety. Medication administered per MD order (see eMAR) Encouragement and support provided.   R:Safety maintained. Compliant with medication regimen. Will continue to monitor.

## 2015-06-09 NOTE — Tx Team (Signed)
Interdisciplinary Treatment Plan Update (Adult) Date: 06/09/2015    Time Reviewed: 9:30 AM  Progress in Treatment: Attending groups: Yes Participating in groups: Yes Taking medication as prescribed: Yes Tolerating medication: Yes Family/Significant other contact made: No, patient has declined collateral contact Patient understands diagnosis: Yes Discussing patient identified problems/goals with staff: Yes Medical problems stabilized or resolved: Yes Denies suicidal/homicidal ideation: Yes Issues/concerns per patient self-inventory: Yes Other:  New problem(s) identified: N/A  Discharge Plan or Barriers: Home with outpatient services.  Reason for Continuation of Hospitalization:  Depression Anxiety Medication Stabilization   Comments: N/A  Estimated length of stay: Discharge anticipated for today, 11/9.   Patient is a 69 year old Caucasian male admitted for ETOH abuse. Patient lives alone in an extended stay motel in [place] and plans to return at discharge. Stressors include: feeling lonely and his alcoholism Patient identifies his 2 adult sons as supportive. He reports that he is not interested in residential tx or AA at this time. He prefers to return home to follow up with outpatient services. Patient identifies his goals as to "get my health back, get my license, start playing golf again." Patient will benefit from crisis stabilization, medication evaluation, group therapy, and psycho education in addition to case management for discharge planning. Patient and CSW reviewed pt's identified goals and treatment plan. Pt verbalized understanding and agreed to treatment plan.     Review of initial/current patient goals per problem list:  1. Goal(s): Patient will participate in aftercare plan   Met: Yes   Target date: 3-5 days post admission date   As evidenced by: Patient will participate within aftercare plan AEB aftercare provider and housing plan at discharge  being identified.  11/4: Goal met: Patient plans to return home with outpatient services at discharge.     2. Goal (s): Patient will exhibit decreased depressive symptoms and suicidal ideations.   Met: Yes   Target date: 3-5 days post admission date   As evidenced by: Patient will utilize self rating of depression at 3 or below and demonstrate decreased signs of depression or be deemed stable for discharge by MD.  11/4: Goal progressing. Patient rates depression at 8-9 today, denies SI.  11/7: Goal Progressing. Patient rates depression at 5 today, denies SI.             11/9:  Goal met.  Patient rates depression at 2-3 today.     3. Goal(s): Patient will demonstrate decreased signs and symptoms of anxiety.   Met:  Adequate for discharge per M.D.   Target date: 3-5 days post admission date   As evidenced by: Patient will utilize self rating of anxiety at 3 or below and demonstrated decreased signs of anxiety, or be deemed stable for discharge by MD  11/4: Goal Progressing. Patient rates anxiety as high today.  11/7: Goal Progressing. Patient rates anxiety as high today.             11/9: Adequate for discharge.  Patient reports anxiety related to discharge, but reports feeling safe to go home.    4. Goal(s): Patient will demonstrate decreased signs of withdrawal due to substance abuse   Met: Adequate for discharge per MD.   Target date: 3-5 days post admission date   As evidenced by: Patient will produce a CIWA/COWS score of 0, have stable vitals signs, and no symptoms of withdrawal  11/4: Goal Progressing. Patient with CIWA score of 1 today, experiencing tremors.  11/7: Goal not met: Pt continues  to have withdrawal symptoms of tremor and anxiety and a CIWA score of a 4 today.  Pt to show decrease withdrawal symptoms prior to d/c.             11/9: Adequate for discharge.  Patient has CIWA score of 2, experiencing anxiety and tremors today, but reports feeling safe  for discharge.       Attendees: Patient:    Family:    Physician: Dr. Parke Poisson; Dr. Sabra Heck 06/09/2015 9:30 AM  Nursing: Eddie North., Loletta Specter, Romana Juniper [place]  RN 06/09/2015 9:30 AM  Clinical Social Worker: Tilden Fossa, Paulina Fusi [place]iffe[place], LCSWA 06/09/2015 9:30 AM  Other: Louie Bun Smart LCSWA 06/09/2015 9:30 AM  Other: Norberto Sorenson, P4CC  06/09/2015 9:30 AM  Other: Lars Pinks, Case Manager 06/09/2015 9:30 AM  Other: 06/09/2015 9:30 AM     Scribe for Treatment Team:  Tilden Fossa, MSW, [employer] [phone]

## 2015-06-09 NOTE — Progress Notes (Signed)
[facility] MD Progress Note  06/09/2015 4:19 PM Roberto [name]  MRN:  [id number] Subjective:  Windy Fast complained of stuttering. He states he has never had that happened to him before. He is still endorsing anxiety and worry as it gets closer for him to go back home. 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 Alcohol dependence (HCC) [F10.20] 06/02/2015  . Acute encephalopathy [G93.40] 09/20/2014  . Alcoholic ketoacidosis [E87.2] 78/29/5621  . Delirium tremens (HCC) [F10.231] 09/19/2014  . Hypokalemia [E87.6] 05/20/2012  . Acute respiratory failure (HCC) [J96.00] 05/19/2012  . Severe protein-calorie malnutrition (HCC) [E43] 05/19/2012  . Sepsis (HCC) [A41.9] 05/19/2012  . Alcohol withdrawal (HCC) [F10.239] 05/18/2012  . Encephalopathy [G93.40] 05/18/2012  . Dyspnea [R06.00] 05/18/2012  . Ascites [R18.8] 05/18/2012  . Volume overload [E87.70] 05/18/2012  . Helicobacter pylori antibody positive [R76.0] 12/19/2011  . Adynamic ileus (HCC) [K56.0] 12/18/2011  . 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 Hx of substance abuse [Z87.898] 12/17/2011  . Cirrhosis (HCC) [K74.60] 12/16/2011  . Abdominal pain, diffuse [R10.9] 12/15/2011  . Thrombocytopenia (HCC) [D69.6] 12/15/2011  . Duodenal ulcer with hemorrhage [K26.4] 12/15/2011  . Esophageal varices without bleeding (HCC) [I85.00] 12/15/2011  . Hepatic steatosis [K76.0] 12/14/2011  . Compression fracture of L1 lumbar vertebra (HCC) [S32.010A] 12/14/2011  . Hypocalcemia [E83.51] 12/14/2011  . Acute renal failure (HCC) [N17.9] 12/14/2011  . Hypernatremia [E87.0] 12/14/2011  . Hypotension [458] 12/14/2011  . Hyperglycemia [R73.9]  12/14/2011  . Alcoholic hepatitis [K70.10] 12/14/2011  . GI bleed [K92.2] 12/13/2011  . Acute blood loss anemia [D62] 12/13/2011  . Alcohol abuse [F10.10] 12/13/2011  . Jaundice [R17] 12/13/2011  . Coagulopathy (HCC) [D68.9] 12/13/2011  . Macrocytic anemia [D53.9] 12/13/2011  . Renal insufficiency [N28.9] 12/13/2011  . Hepatic encephalopathy (HCC) [K72.90] 12/13/2011  . UTI (urinary tract infection) [N39.0] 12/13/2011  . Debility [R53.81] 12/13/2011  . Frequent falls [R29.6] 12/13/2011  . Pneumonia [J18.9] 08/17/2011  . HTN (hypertension) [I10] 08/17/2011  . COPD (chronic obstructive pulmonary disease) (HCC) [J44.9] 08/17/2011  . Smoker, current status unknown [Z72.0] 08/17/2011   Total Time spent with patient: 30 minutes  Past Psychiatric History: see admission H and P  Past Medical History:  Past Medical History  Diagnosis Date  . COPD (chronic obstructive pulmonary disease) (HCC)   . Hypertension   . Hepatic steatosis 12/14/2011  . Compression fracture of L1 lumbar vertebra (HCC) 12/14/2011  . C2 cervical fracture (HCC)   . Duodenal ulcer with hemorrhage 12/15/2011    Likely source of bleeding.per EGD; Dr. Karilyn Cota  . Esophageal varices without bleeding (HCC) 12/15/2011    per EGD  . Cirrhosis (HCC) 12/16/2011    per ultrasound  . Hx of substance abuse 12/17/2011  . 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 Hepatic encephalopathy (HCC)   . Helicobacter pylori antibody positive 12/19/2011  . Encephalopathy 05/18/2012  . UTI (urinary tract infection) 12/13/2011  . Ascites 05/18/2012    S/p paracentesis yielding 7880 mL  . Cirrhosis (HCC)   . Depression     Past Surgical History  Procedure Laterality Date  . Coronary angioplasty with stent placement     Family History: History reviewed. No pertinent family history. Family Psychiatric  History: see admission H and P  Social History:  History  Alcohol Use  . Yes    Comment: drinking  everyday.     History  Drug Use  . Yes  . Special: Cocaine    Comment: prescription drugs/   says no cocaine in 1 month  12/03/2013    Social History   Social History  . Marital Status: Divorced    Spouse Name: N/A  . Number of Children: N/A  . Years of Education: N/A   Social History Main Topics  . Smoking status: Current Every Day Smoker -- 1.00 packs/day    Types: Cigarettes  . Smokeless tobacco: Current User     Comment: 1 1/2 pack a day  . Alcohol Use: Yes     Comment: drinking everyday.  . Drug Use: Yes    Special: Cocaine     Comment: prescription drugs/   says no cocaine in 1 month  12/03/2013  . Sexual Activity: Not Currently   Other Topics Concern  . None   Social History Narrative   Additional Social History:    Pain Medications: denies History of alcohol / drug use?: Yes Longest period of sobriety (when/how long): None  Negative Consequences of Use: Financial, Personal relationships Withdrawal Symptoms:  (muscle weakness, tremors) Name of Substance 1: Alcohol  1 - Age of First Use: 16 yoa 1 - Amount (size/oz): 12 pack or 4 40's 1 - Frequency: Daily  1 - Duration: On-going  1 - Last Use / Amount: 06/01/15 Name of Substance 2: Crack  2 - Age of First Use: 40's  2 - Amount (size/oz): $60 2 - Frequency: Wkly  2 - Duration: On-going  2 - Last Use / Amount: 1 Wk Ago                 Sleep: Fair  Appetite:  Fair  Current Medications: Current Facility-Administered Medications  Medication Dose Route Frequency Provider Last Rate Last Dose  . acetaminophen (TYLENOL) tablet 650 mg  650 mg Oral Q6H PRN Rachael Fee, MD      . albuterol (PROVENTIL HFA;VENTOLIN HFA) 108 (90 BASE) MCG/ACT inhaler 1-2 puff  1-2 puff Inhalation Q6H PRN Rachael Fee, MD   2 puff at 06/08/15 2149  . alum & mag hydroxide-simeth (MAALOX/MYLANTA) 200-200-20 MG/5ML suspension 30 mL  30 mL Oral Q4H PRN Rachael Fee, MD   30 mL at 06/08/15 2231  . escitalopram (LEXAPRO) tablet 10  mg  10 mg Oral Daily Rachael Fee, MD   10 mg at 06/09/15 0834  . Influenza vac split quadrivalent PF (FLUARIX) injection 0.5 mL  0.5 mL Intramuscular Tomorrow-1000 Rachael Fee, MD   0.5 mL at 06/04/15 1028  . magnesium hydroxide (MILK OF MAGNESIA) suspension 30 mL  30 mL Oral Daily PRN Rachael Fee, MD      . mirtazapine (REMERON) tablet 15 mg  15 mg Oral QHS Rachael Fee, MD   15 mg at 06/08/15 2149  . multivitamin with minerals tablet 1 tablet  1 tablet Oral Daily Rachael Fee, MD   1 tablet at 06/09/15 0831  . nicotine (NICODERM CQ - dosed in mg/24 hours) patch 21 mg  21 mg Transdermal Daily Kerry Hough, PA-C   21 mg at 06/09/15 0831  . nystatin (MYCOSTATIN/NYSTOP) topical powder   Topical TID Kerry Hough, PA-C      . pantoprazole (PROTONIX) EC tablet 40 mg  40 mg Oral Daily Rachael Fee, MD   [id number]  mg at 06/09/15 0834  . pneumococcal 23 valent vaccine (PNU-IMMUNE) injection 0.5 mL  0.5 mL Intramuscular Tomorrow-1000 Rachael FeeIrving A Kyelle Urbas, MD   0.5 mL at 06/04/15 1029  . thiamine (VITAMIN B-1) tablet 100 mg  100 mg Oral Daily Rachael FeeIrving A Elin Fenley, MD   100 mg at 06/09/15 0831    Lab Results: No results found for this or any previous visit (from the past 48 hour(s)).  Physical Findings: AIMS: Facial and Oral Movements Muscles of Facial Expression: None, normal Lips and Perioral Area: None, normal Jaw: None, normal Tongue: None, normal,Extremity Movements Upper (arms, wrists, hands, fingers): None, normal Lower (legs, knees, ankles, toes): None, normal, Trunk Movements Neck, shoulders, hips: None, normal, Overall Severity Severity of abnormal movements (highest score from questions above): None, normal Incapacitation due to abnormal movements: None, normal Patient's awareness of abnormal movements (rate only patient's report): No Awareness, Dental Status Current problems with teeth and/or dentures?: Yes Does patient usually wear dentures?: Yes  CIWA:  CIWA-Ar Total: 0 COWS:  COWS Total  Score: 2  Musculoskeletal: Strength & Muscle Tone: within normal limits Gait & Station: normal Patient leans: normal  Psychiatric Specialty Exam: Review of Systems  Constitutional: Negative.   HENT: Negative.   Eyes: Negative.   Respiratory: Negative.   Cardiovascular: Negative.   Gastrointestinal: Negative.   Genitourinary: Negative.   Musculoskeletal: Negative.   Skin: Negative.   Neurological: Negative.   Endo/Heme/Allergies: Negative.   Psychiatric/Behavioral: Positive for depression and substance abuse. The patient is nervous/anxious.     Blood pressure 139/80, pulse 61, temperature 98.4 F (36.9 C), temperature source Oral, resp. rate 16, height 6\' 1"  (1.854 m), weight 95.709 kg (211 lb), SpO2 99 %.Body mass index is 27.84 kg/(m^2).  General Appearance: Fairly Groomed  Patent attorneyye Contact::  Fair  Speech:  some hesitation stuttering   Volume:  fluctuates  Mood:  Anxious and worried  Affect:  anxious worried  Thought Process:  Coherent and Goal Directed  Orientation:  Full (Time, Place, and Person)  Thought Content:  symptoms events worries concerns  Suicidal Thoughts:  No  Homicidal Thoughts:  No  Memory:  Immediate;   Fair Recent;   Fair Remote;   Fair  Judgement:  Fair  Insight:  Present and Shallow  Psychomotor Activity:  Restlessness  Concentration:  Fair  Recall:  [employer]Fair  Fund of Knowledge:Fair  Language: Fair  Akathisia:  No  Handed:  Right  AIMS (if indicated):     Assets:  Desire for Improvement Housing Social Support  ADL's:  Intact  Cognition: WNL  Sleep:  Number of Hours: 6   Treatment Plan Summary: Daily contact with patient to assess and evaluate symptoms and progress in treatment and Medication management Supportive approach/coping skills Alcohol dependence: will continue to work a relapse prevention plan Stuttering; will D/C the Neurontin as it could be the culprit. He states he has not had this happened before has been going on only couple of  days Anxiety-depression: will continue the Lexapro 10 mg daily Insomnia: will continue the Remeron 15 mg HS Use CBT/mindfulness Myleigh Amara A 06/09/2015, 4:19 PM

## 2015-06-09 NOTE — [facility] Group Notes (Signed)
[facility] LCSW Group Therapy 06/09/2015  1:15 PM Type of Therapy: Group Therapy Participation Level: Active  Participation Quality: Attentive, Sharing and Supportive  Affect: Appropriate  Cognitive: Alert and Oriented  Insight: Developing/Improving and Engaged  Engagement in Therapy: Developing/Improving and Engaged  Modes of Intervention: Clarification, Confrontation, Discussion, Education, Exploration, Limit-setting, Orientation, Problem-solving, Rapport Building, Dance movement psychotherapisteality Testing, Socialization and Support  Summary of Progress/Problems: The topic for group today was emotional regulation. This group focused on both positive and negative emotion identification and allowed group members to process ways to identify feelings, regulate negative emotions, and find healthy ways to manage internal/external emotions. Group members were asked to reflect on a time when their reaction to an emotion led to a negative outcome and explored how alternative responses using emotion regulation would have benefited them. Group members were also asked to discuss a time when emotion regulation was utilized when a negative emotion was experienced. Patient discussed feeling anger towards himself for his alcohol abuse and loss of home and relationships and material possessions. Patient identified boredom since his retirement as a trigger for relapse. CSWs and other group members provided patient with emotional support and encouragement.   Samuella BruinKristin Tomesha Sargent, MSW, [employer] IncLCSWA Clinical Social Worker [facility] [phone]

## 2015-06-09 NOTE — [facility] Group Notes (Signed)
   [facility]  06/09/2015  8:45 AM   Participation Quality: Alert, Appropriate and Oriented  Mood/Affect: Anxious  Depression Rating: 2-3  Anxiety Rating: Reports high anxiety levels today  Thoughts of Suicide: Pt denies SI/HI  Will you contract for safety? Yes  Current AVH: Pt denies  Plan for Discharge/Comments: Pt attended discharge planning group and actively participated in group. CSW provided pt with today's workbook. Patient reports low levels of depression but high anxiety related to concerns about his speech. Patient plans to return home to motel with outpatient services.  Transportation Means: Pt reports access to transportation- needs transportation back to [place].  Supports: No supports mentioned at this time  Samuella BruinKristin Adrik Khim, MSW, [employer] IncLCSWA Clinical Social Worker [facility] [phone]

## 2015-06-10 DIAGNOSIS — F10230 Alcohol dependence with withdrawal, uncomplicated: Secondary | ICD-10-CM | POA: Insufficient documentation

## 2015-06-10 MED ORDER — ESCITALOPRAM OXALATE 10 MG PO TABS: 10.0000 mg | ORAL_TABLET | Freq: Every day | ORAL | Status: DC

## 2015-06-10 MED ORDER — PANTOPRAZOLE SODIUM 40 MG PO TBEC: 40.0000 mg | DELAYED_RELEASE_TABLET | Freq: Every day | ORAL | Status: DC

## 2015-06-10 MED ORDER — ALBUTEROL SULFATE (2.5 MG/3ML) 0.083% IN NEBU: 2.5000 mg | INHALATION_SOLUTION | Freq: Four times a day (QID) | RESPIRATORY_TRACT | Status: AC | PRN

## 2015-06-10 MED ORDER — MIRTAZAPINE 15 MG PO TABS: 15.0000 mg | ORAL_TABLET | Freq: Every day | ORAL | Status: DC

## 2015-06-10 MED ORDER — ESCITALOPRAM OXALATE 10 MG PO TABS
10.0000 mg | ORAL_TABLET | Freq: Every day | ORAL | Status: DC
Start: 2015-06-10 — End: 2015-06-19

## 2015-06-10 MED ORDER — THIAMINE HCL 100 MG PO TABS: 100.0000 mg | ORAL_TABLET | Freq: Every day | ORAL | Status: DC

## 2015-06-10 MED ORDER — ADULT MULTIVITAMIN W/MINERALS CH: 1.0000 | ORAL_TABLET | Freq: Every day | ORAL | Status: DC

## 2015-06-10 NOTE — [facility] Suicide Risk Assessment (Signed)
[facility] Discharge Suicide Risk Assessment   Demographic Factors:  Male, Age 69 or older and Caucasian  Total Time spent with patient: 30 minutes  Musculoskeletal: Strength & Muscle Tone: within normal limits Gait & Station: normal Patient leans: normal  Psychiatric Specialty Exam: Physical Exam  Review of Systems  Constitutional: Negative.   HENT: Negative.   Eyes: Negative.   Respiratory: Negative.   Cardiovascular: Negative.   Gastrointestinal: Negative.   Genitourinary: Negative.   Musculoskeletal: Negative.   Skin: Negative.   Neurological: Negative.   Endo/Heme/Allergies: Negative.   Psychiatric/Behavioral: Positive for substance abuse. The patient is nervous/anxious.     Blood pressure 112/72, pulse 71, temperature 97.6 F (36.4 C), temperature source Oral, resp. rate 18, height  (1.854 m), weight 95.709 kg (211 lb), SpO2 99 %.Body mass index is 27.84 kg/(m^2).  General Appearance: Fairly Groomed  Patent attorney::  Fair  Speech:  Clear and Coherent409  Volume:  Normal  Mood:  Anxious  Affect:  Appropriate  Thought Process:  Coherent and Goal Directed  Orientation:  Full (Time, Place, and Person)  Thought Content:  plans as he moves on, relapse prevention plan  Suicidal Thoughts:  No  Homicidal Thoughts:  No  Memory:  Immediate;   Fair Recent;   Fair Remote;   Fair  Judgement:  Fair  Insight:  Present  Psychomotor Activity:  Normal  Concentration:  Fair  Recall:  [employer] of Knowledge:Fair  Language: Fair  Akathisia:  No  Handed:  Right  AIMS (if indicated):     Assets:  Desire for Improvement Housing Social Support  Sleep:  Number of Hours: 6  Cognition: WNL  ADL's:  Intact   Have you used any form of tobacco in the last 30 days? (Cigarettes, Smokeless Tobacco, Cigars, and/or Pipes): Yes  Has this patient used any form of tobacco in the last 30 days? (Cigarettes, Smokeless Tobacco, Cigars, and/or Pipes) Yes, A prescription for an FDA-approved  tobacco cessation medication was offered at discharge and the patient refused  Mental Status Per Nursing Assessment::   On Admission:     Current Mental Status by Physician: In full contact with reality. There are no active S/S of withdrawal. There are no active SI plans or intent. He states that the stuttering is much better (off the Neurontin) he admits he is worried as he is D/C but also says he feels this is normal. He is going to [facility] and from there his brother will pick him up and take him to his place. He has a plan in place to get more active, involved. He states he is committed to abstinence   Loss Factors: Decline in physical health  Historical Factors: NA  Risk Reduction Factors:   Positive social support  Continued Clinical Symptoms:  Depression:   Comorbid alcohol abuse/dependence Alcohol/Substance Abuse/Dependencies  Cognitive Features That Contribute To Risk:  Closed-mindedness, Polarized thinking and Thought constriction (tunnel vision)    Suicide Risk:  Minimal: No identifiable suicidal ideation.  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 Alcohol dependence (HCC) [F10.20] 06/02/2015  . Acute encephalopathy [G93.40] 09/20/2014  . Alcoholic ketoacidosis [E87.2] 16/05/9603  . Delirium tremens (  HCC) [F10.231] 09/19/2014  . Hypokalemia [E87.6] 05/20/2012  . Acute respiratory failure (HCC) [J96.00] 05/19/2012  . Severe protein-calorie malnutrition (HCC) [E43] 05/19/2012  . Sepsis (HCC) [A41.9] 05/19/2012  . Alcohol withdrawal (HCC) [F10.239] 05/18/2012  . Encephalopathy [G93.40] 05/18/2012  .  Dyspnea [R06.00] 05/18/2012  . Ascites [R18.8] 05/18/2012  . Volume overload [E87.70] 05/18/2012  . Helicobacter pylori antibody positive [R76.0] 12/19/2011  . Adynamic ileus (HCC) [K56.0] 12/18/2011  . 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 Hx of substance abuse [Z87.898] 12/17/2011  . Cirrhosis (HCC) [K74.60] 12/16/2011  . Abdominal pain, diffuse [R10.9] 12/15/2011  . Thrombocytopenia (HCC) [D69.6] 12/15/2011  . Duodenal ulcer with hemorrhage [K26.4] 12/15/2011  . Esophageal varices without bleeding (HCC) [I85.00] 12/15/2011  . Hepatic steatosis [K76.0] 12/14/2011  . Compression fracture of L1 lumbar vertebra (HCC) [S32.010A] 12/14/2011  . Hypocalcemia [E83.51] 12/14/2011  . Acute renal failure (HCC) [N17.9] 12/14/2011  . Hypernatremia [E87.0] 12/14/2011  . Hypotension [458] 12/14/2011  . Hyperglycemia [R73.9] 12/14/2011  . Alcoholic hepatitis [K70.10] 12/14/2011  . GI bleed [K92.2] 12/13/2011  . Acute blood loss anemia [D62] 12/13/2011  . Alcohol abuse [F10.10] 12/13/2011  . Jaundice [R17] 12/13/2011  . Coagulopathy (HCC) [D68.9] 12/13/2011  . Macrocytic anemia [D53.9] 12/13/2011  . Renal insufficiency [N28.9] 12/13/2011  . Hepatic encephalopathy (HCC) [K72.90] 12/13/2011  . UTI (urinary tract infection) [N39.0] 12/13/2011  . Debility [R53.81] 12/13/2011  . Frequent falls [R29.6] 12/13/2011  . Pneumonia [J18.9] 08/17/2011  . HTN (hypertension) [I10] 08/17/2011  . COPD (chronic obstructive pulmonary disease) (HCC) [J44.9] 08/17/2011  . Smoker, current status unknown [Z72.0] 08/17/2011    Follow-up Information    [facility]   Why:  Walk in clinic on Monday Nov. 14th between 8am to 11am for assessment for therapy and medication management services. Bring ID, social security card, insurance card, copay, and proof of insurance. Call if you need to reschedule.   Contact information:   [address] [place], [place] [address] Phone: [phone]      Plan Of Care/Follow-up recommendations:  Activity:  as tolerated Diet:  heart healthy Follow up as above Is patient on multiple antipsychotic therapies at discharge:  No   Has Patient had three or more failed trials of antipsychotic monotherapy by history:  No  Recommended Plan for Multiple Antipsychotic Therapies: NA    Jesly Hartmann A 06/10/2015, 12:56 PM

## 2015-06-10 NOTE — Progress Notes (Signed)
Discharge note:Pt discharged per MD order, Discharge summary reviewed with pt. Pt expresses understanding of discharge summary. Pt denies SI/HI, AVH at discharge. RX and sample medication provided. All personal items returned to pt from locker. Pt verbalizes and signs that he received all personal items. [facility] transport at facility for discharge. Ambulatory out of facility.

## 2015-06-10 NOTE — Progress Notes (Signed)
D: Pt presents anxious in affect and pleasant in mood. Pt reports that his stuttering decreased upon the discontinuation of his Neurontin. Pt does endorse having increased anxiety afterwards. Pt reports a plan to practice sobriety. Pt verbalizes that he would cut back on smoking cigarettes. Pt denies any active withdrawal symptoms. Pt is visible and active within the milieu. Pt denies any suicidal ideation. A: Writer administered scheduled and prn medications to pt, per MD orders. Continued support and availability as needed was extended to this pt. Staff continues to monitor pt with q6115min checks.  R: No adverse drug reactions noted. Pt receptive to treatment. Pt remains safe at this time.

## 2015-06-10 NOTE — Discharge Summary (Signed)
Physician Discharge Summary Note  Patient:  Roberto [name] is an 69 y.o., male MRN:  [id number] DOB:  1945/12/10 Patient phone:  [phone] (home)  Patient address:   [address] [place] [address],  Total Time spent with patient: 30 minutes  Date of Admission:  06/02/2015 Date of Discharge: 06/10/2015  Reason for Admission:PER H&P 69 Y/O male with history of alcohol dependence, opioid, cocaine abuse who claims he had been controlling his alcohol use until more recently. On his birthday he started drinking more and more. He states he had been told he has cirrhosis of the liver and that he had to quit or else he was going to die. In February this year he was admitted with DT's. He has also been using cocaine. States that after his mother was placed in a "home" he had to find another place to live. He was staying with her. He is renting a room in a motel that was converted. States it is in [place] "rough" neighborhood. States there are drugs "all around." He admits he is bored. He used to be more active and involved but as recently he has been isolated what has triggered his increased use of alcohol "something to do." states that there were some withdrawal of money from the [facility] and he does not remember doing it, this has him more worried The initial assessment was as follows: Roberto [name] is a 69 y.o. male who voluntarily presents to [facility] for alcohol/crack detox. Pt is c/o SI, no specific plan to harm himself--"I don't care if I live or die". Pt was brought in by EMS after he sustained a laceration to to left foot when he dropped a beer bottle in his bathroom. Pt was intoxicated upon arrival and endorses heavy alcohol use--he drinks at least a 12pk of beer, daily and his last drink was yesterday and he drank a 12pk of beer. Pt also smokes $60 worth of crack, weekly and he last used crack on 05/28/15--"I was cooking it up in my room". Pt denies seizures/blackouts. Pt is c/o w/d--racing  heart, numbness in hands/fingers and leg cramps. Pt is tearful during interview and states that he chronic liver problems because of his drinking. Pt denies HI/AVH.  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 Alcohol dependence (HCC) [F10.20] 06/02/2015  . Acute encephalopathy [G93.40] 09/20/2014  . Alcoholic ketoacidosis [E87.2] 21/30/8657  . Delirium tremens (HCC) [F10.231] 09/19/2014  . Hypokalemia [E87.6] 05/20/2012  . Acute respiratory failure (HCC) [J96.00] 05/19/2012  . Severe protein-calorie malnutrition (HCC) [E43] 05/19/2012  . Sepsis (HCC) [A41.9] 05/19/2012  . Alcohol withdrawal (HCC) [F10.239] 05/18/2012  . Encephalopathy [G93.40] 05/18/2012  . Dyspnea [R06.00] 05/18/2012  . Ascites [R18.8] 05/18/2012  . Volume overload [E87.70] 05/18/2012  . Helicobacter pylori antibody positive [R76.0] 12/19/2011  . Adynamic ileus (HCC) [K56.0] 12/18/2011  . 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 Hx of substance abuse [Z87.898] 12/17/2011  . Cirrhosis (HCC) [K74.60] 12/16/2011  . Abdominal pain, diffuse [R10.9] 12/15/2011  . Thrombocytopenia (HCC) [D69.6] 12/15/2011  . Duodenal ulcer with hemorrhage [K26.4] 12/15/2011  . Esophageal varices without bleeding (HCC) [I85.00] 12/15/2011  . Hepatic steatosis [K76.0] 12/14/2011  . Compression fracture of L1 lumbar vertebra (HCC) [S32.010A] 12/14/2011  . Hypocalcemia [E83.51] 12/14/2011  . Acute renal failure (HCC) [N17.9]  12/14/2011  . Hypernatremia [E87.0] 12/14/2011  . Hypotension [458] 12/14/2011  . Hyperglycemia [R73.9] 12/14/2011  . Alcoholic hepatitis [K70.10] 12/14/2011  . GI bleed [K92.2] 12/13/2011  . Acute blood loss anemia [D62] 12/13/2011  . Alcohol abuse [F10.10]  12/13/2011  . Jaundice [R17] 12/13/2011  . Coagulopathy (HCC) [D68.9] 12/13/2011  . Macrocytic anemia [D53.9] 12/13/2011  . Renal insufficiency [N28.9] 12/13/2011  . Hepatic encephalopathy (HCC) [K72.90] 12/13/2011  . UTI (urinary tract infection) [N39.0] 12/13/2011  . Debility [R53.81] 12/13/2011  . Frequent falls [R29.6] 12/13/2011  . Pneumonia [J18.9] 08/17/2011  . HTN (hypertension) [I10] 08/17/2011  . COPD (chronic obstructive pulmonary disease) (HCC) [J44.9] 08/17/2011  . Smoker, current status unknown [Z72.0] 08/17/2011    Musculoskeletal: Strength & Muscle Tone: within normal limits Gait & Station: normal Patient leans: N/A  Psychiatric Specialty Exam: SEE SRA BY MD Physical Exam  Nursing note and vitals reviewed. Constitutional: He is oriented to person, place, and time. He appears well-developed.  Neck: Normal range of motion.  Musculoskeletal: Normal range of motion.  Neurological: He is alert and oriented to person, place, and time.  Skin: Skin is warm and dry.  Psychiatric: He has a normal mood and affect. His behavior is normal. Judgment and thought content normal.    Review of Systems  Psychiatric/Behavioral: Negative for suicidal ideas. Depression: stable. Nervous/anxious: stable.   All other systems reviewed and are negative.   Blood pressure 112/72, pulse 71, temperature 97.6 F (36.4 C), temperature source Oral, resp. rate 18, height  (1.854 m), weight 95.709 kg (211 lb), SpO2 99 %.Body mass index is 27.84 kg/(m^2).  Have you used any form of tobacco in the last 30 days? (Cigarettes, Smokeless Tobacco, Cigars, and/or Pipes): Yes  Has this patient used any form of tobacco in the last 30 days? (Cigarettes, Smokeless Tobacco, Cigars, and/or Pipes) No  Past Medical History:  Past Medical History  Diagnosis Date  . COPD (chronic obstructive pulmonary disease) (HCC)   . Hypertension   . Hepatic steatosis 12/14/2011  . Compression fracture of L1 lumbar  vertebra (HCC) 12/14/2011  . C2 cervical fracture (HCC)   . Duodenal ulcer with hemorrhage 12/15/2011    Likely source of bleeding.per EGD; Dr. Karilyn Cota  . Esophageal varices without bleeding (HCC) 12/15/2011    per EGD  . Cirrhosis (HCC) 12/16/2011    per ultrasound  . Hx of substance abuse 12/17/2011  . 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 Hepatic encephalopathy (HCC)   . Helicobacter pylori antibody positive 12/19/2011  . Encephalopathy 05/18/2012  . UTI (urinary tract infection) 12/13/2011  . Ascites 05/18/2012    S/p paracentesis yielding 7880 mL  . Cirrhosis (HCC)   . Depression     Past Surgical History  Procedure Laterality Date  . Coronary angioplasty with stent placement     Family History: History reviewed. No pertinent family history. Social History:  History  Alcohol Use  . Yes    Comment: drinking everyday.     History  Drug Use  . Yes  . Special: Cocaine    Comment: prescription drugs/   says no cocaine in 1 month  12/03/2013    Social History   Social History  . Marital Status: Divorced    Spouse Name: N/A  . Number of Children: N/A  . Years of Education: N/A   Social History Main Topics  . Smoking status: Current Every Day Smoker -- 1.00 packs/day    Types: Cigarettes  . Smokeless  tobacco: Current User     Comment: 1 1/2 pack a day  . Alcohol Use: Yes     Comment: drinking everyday.  . Drug Use: Yes    Special: Cocaine     Comment: prescription drugs/   says no cocaine in 1 month  12/03/2013  . Sexual Activity: Not Currently   Other Topics Concern  . None   Social History Narrative    Risk to Self: Is patient at risk for suicide?: No What has been your use of drugs/alcohol within the last 12 months?: Daily ETOH use- 6 to 12 beers daily Risk to Others:  no Prior Inpatient Therapy:  yes Prior Outpatient Therapy:  yes  Level of Care:  OP  Hospital Course:  Roberto [name] was admitted for Onset of  alcohol-induced mood disorder during intoxication (HCC) and crisis management.  He was treated discharged with the medications listed below under Medication List.  Medical problems were identified and treated as needed.  Home medications were restarted as appropriate.  Improvement was monitored by observation and Roberto [name] daily report of symptom reduction.  Emotional and mental status was monitored by daily self-inventory reports completed by Roberto [name] and clinical staff.         Roberto [name] was evaluated by the treatment team for stability and plans for continued recovery upon discharge.  Roberto [name] motivation was an integral factor for scheduling further treatment.  Employment, transportation, bed availability, health status, family support, and any pending legal issues were also considered during his hospital stay. He  was offered further treatment options upon discharge including but not limited to Residential, Intensive Outpatient, and Outpatient treatment.  Roberto [name] will follow up with the services as listed below under Follow Up Information.     Upon completion of this admission the patient was both mentally and medically stable for discharge denying suicidal/homicidal ideation, auditory/visual/tactile hallucinations, delusional thoughts and paranoia.      Consults:  psychiatry  Significant Diagnostic Studies:  labs: UDS + cocaine, Etoh 379, CBC 4.06 MCV 101.7 Platlets 93, CMP: calcium 8.3, AST 89  Discharge Vitals:   Blood pressure 112/72, pulse 71, temperature 97.6 F (36.4 C), temperature source Oral, resp. rate 18, height 6\' 1"  (1.854 m), weight 95.709 kg (211 lb), SpO2 99 %. Body mass index is 27.84 kg/(m^2). Lab Results:   No results found for this or any previous visit (from the past 72 hour(s)).  Physical Findings: AIMS: Facial and Oral Movements Muscles of Facial Expression: None, normal Lips and Perioral Area: None, normal Jaw: None, normal Tongue:  None, normal,Extremity Movements Upper (arms, wrists, hands, fingers): None, normal Lower (legs, knees, ankles, toes): None, normal, Trunk Movements Neck, shoulders, hips: None, normal, Overall Severity Severity of abnormal movements (highest score from questions above): None, normal Incapacitation due to abnormal movements: None, normal Patient's awareness of abnormal movements (rate only patient's report): No Awareness, Dental Status Current problems with teeth and/or dentures?: Yes Does patient usually wear dentures?: Yes  CIWA:  CIWA-Ar Total: 0 COWS:  COWS Total Score: 2   See Psychiatric Specialty Exam and Suicide Risk Assessment completed by Attending Physician prior to discharge.  Discharge destination:  Home  Is patient on multiple antipsychotic therapies at discharge:  No   Has Patient had three or more failed trials of antipsychotic monotherapy by history:  No  Recommended Plan for Multiple Antipsychotic Therapies: NA  Discharge Instructions    Activity as tolerated - No restrictions  Complete by:  As directed      Activity as tolerated - No restrictions    Complete by:  As directed      Diet general    Complete by:  As directed      Diet general    Complete by:  As directed             Medication List    STOP taking these medications        cephALEXin 500 MG capsule  Commonly known as:  KEFLEX     omeprazole 20 MG capsule  Commonly known as:  PRILOSEC  Replaced by:  pantoprazole 40 MG tablet      TAKE these medications      Indication   albuterol (2.5 MG/3ML) 0.083% nebulizer solution  Commonly known as:  PROVENTIL  Take 3 mLs (2.5 mg total) by nebulization every 6 (six) hours as needed for wheezing or shortness of breath.   Indication:  Acute Bronchospasm     escitalopram 5 MG tablet  Commonly known as:  LEXAPRO  Take 1 tablet (5 mg total) by mouth daily.   Indication:  Depression     escitalopram 10 MG tablet  Commonly known as:  LEXAPRO   Take 1 tablet (10 mg total) by mouth daily.      mirtazapine 15 MG tablet  Commonly known as:  REMERON  Take 1 tablet (15 mg total) by mouth at bedtime.   Indication:  Trouble Sleeping     multivitamin with minerals Tabs tablet  Take 1 tablet by mouth daily.      pantoprazole 40 MG tablet  Commonly known as:  PROTONIX  Take 1 tablet (40 mg total) by mouth daily.   Indication:  Gastroesophageal Reflux Disease     thiamine 100 MG tablet  Take 1 tablet (100 mg total) by mouth daily.            Follow-up Information    Follow up with [facility] On 06/14/2015.   Why:  Walk in clinic on Monday Nov. 14th between 8am to 11am for assessment for therapy and medication management services. Bring ID, social security card, insurance card, copay, and proof of insurance. Call if you need to reschedule.   Contact information:   [address] [place], [place] [address] Phone: [phone]      Follow-up recommendations:  Activity:  as tolerated Diet:  heart healty  Comments:  Take all of you medications as prescribed by your mental healthcare provider.  Report any adverse effects and reactions from your medications to your outpatient provider promptly. Do not engage in alcohol and or illegal drug use while on prescription medicines. In the event of worsening symptoms call the crisis hotline, 911, and or go to the nearest emergency department for appropriate evaluation and treatment of symptoms. Follow-up with your primary care provider for your medical issues, concerns and or health care needs.   Keep all scheduled appointments.  If you are unable to keep an appointment call to reschedule.  Let the nurse know if you will need medications before next scheduled appointment.  Total Discharge Time: Greater than 30 mintues   Signed: Oneta Rack FNP- [facility] 06/10/2015, 1:01 PM  I personally assessed the patient and formulated the plan Madie Reno A. Dub Mikes, M.D.

## 2015-06-10 NOTE — Progress Notes (Signed)
D:Per patient self inventory form pt reports he slept fair last night with the use of sleep medication. He reports a good appetite, low energy level, good concentration. He rates depression 3/10, hopelessness 3/10, anxiety 4/10- all on 0-10 scale, 10 being the worse. Pt denies SI/HI. Denies AVH. Pt denies physical problems. Pt reports his goal today is "go home"  Pt behavior calm and cooperative. Voices no complaints at this time.  A:Special checks q 15 mins in place for safety. Medication administered per MD order (See eMAR) Encouragement and support provided. Discharge planning in process.  R:Safety maintained. Compliant with medication regimen. Will continue to monitor.

## 2015-06-19 ENCOUNTER — Emergency Department (HOSPITAL_COMMUNITY)
Admission: EM | Admit: 2015-06-19 | Discharge: 2015-06-19 | Disposition: A | Discharge status: Home / Self Care | Payer: Medicare Other | Attending: Emergency Medicine | Admitting: Emergency Medicine

## 2015-06-19 ENCOUNTER — Encounter (HOSPITAL_COMMUNITY): Payer: Self-pay | Admitting: *Deleted

## 2015-06-19 DIAGNOSIS — Z8781 Personal history of (healed) traumatic fracture: Secondary | ICD-10-CM | POA: Insufficient documentation

## 2015-06-19 DIAGNOSIS — Z8669 Personal history of other diseases of the nervous system and sense organs: Secondary | ICD-10-CM | POA: Diagnosis not present

## 2015-06-19 DIAGNOSIS — I10 Essential (primary) hypertension: Secondary | ICD-10-CM | POA: Insufficient documentation

## 2015-06-19 DIAGNOSIS — Z8719 Personal history of other diseases of the digestive system: Secondary | ICD-10-CM | POA: Diagnosis not present

## 2015-06-19 DIAGNOSIS — F1010 Alcohol abuse, uncomplicated: Secondary | ICD-10-CM | POA: Insufficient documentation

## 2015-06-19 DIAGNOSIS — F17210 Nicotine dependence, cigarettes, uncomplicated: Secondary | ICD-10-CM | POA: Diagnosis not present

## 2015-06-19 DIAGNOSIS — Z87440 Personal history of urinary (tract) infections: Secondary | ICD-10-CM | POA: Diagnosis not present

## 2015-06-19 DIAGNOSIS — J449 Chronic obstructive pulmonary disease, unspecified: Secondary | ICD-10-CM | POA: Diagnosis not present

## 2015-06-19 DIAGNOSIS — Z9861 Coronary angioplasty status: Secondary | ICD-10-CM | POA: Insufficient documentation

## 2015-06-19 DIAGNOSIS — Z008 Encounter for other general examination: Secondary | ICD-10-CM | POA: Diagnosis present

## 2015-06-19 DIAGNOSIS — F329 Major depressive disorder, single episode, unspecified: Secondary | ICD-10-CM | POA: Diagnosis not present

## 2015-06-19 DIAGNOSIS — Z8619 Personal history of other infectious and parasitic diseases: Secondary | ICD-10-CM | POA: Diagnosis not present

## 2015-06-19 DIAGNOSIS — Z79899 Other long term (current) drug therapy: Secondary | ICD-10-CM | POA: Diagnosis not present

## 2015-06-19 LAB — COMPREHENSIVE METABOLIC PANEL
ALT: 36 U/L (ref 17–63)
ANION GAP: 9 (ref 5–15)
AST: 69 U/L — AB (ref 15–41)
Albumin: 3.5 g/dL (ref 3.5–5.0)
Alkaline Phosphatase: 69 U/L (ref 38–126)
BUN: 8 mg/dL (ref 6–20)
CHLORIDE: 108 mmol/L (ref 101–111)
CO2: 27 mmol/L (ref 22–32)
Calcium: 8.3 mg/dL — ABNORMAL LOW (ref 8.9–10.3)
Creatinine, Ser: 0.91 mg/dL (ref 0.61–1.24)
Glucose, Bld: 82 mg/dL (ref 65–99)
POTASSIUM: 4 mmol/L (ref 3.5–5.1)
Sodium: 144 mmol/L (ref 135–145)
Total Bilirubin: 0.9 mg/dL (ref 0.3–1.2)
Total Protein: 7.2 g/dL (ref 6.5–8.1)

## 2015-06-19 LAB — CBC WITH DIFFERENTIAL/PLATELET
Basophils Absolute: 0.1 10*3/uL (ref 0.0–0.1)
Basophils Relative: 2 %
Eosinophils Absolute: 0.2 10*3/uL (ref 0.0–0.7)
Eosinophils Relative: 3 %
HCT: 42.3 % (ref 39.0–52.0)
HEMOGLOBIN: 14.5 g/dL (ref 13.0–17.0)
LYMPHS ABS: 2.7 10*3/uL (ref 0.7–4.0)
LYMPHS PCT: 50 %
MCH: 34.4 pg — AB (ref 26.0–34.0)
MCHC: 34.3 g/dL (ref 30.0–36.0)
MCV: 100.5 fL — AB (ref 78.0–100.0)
Monocytes Absolute: 0.5 10*3/uL (ref 0.1–1.0)
Monocytes Relative: 9 %
NEUTROS ABS: 1.9 10*3/uL (ref 1.7–7.7)
NEUTROS PCT: 36 %
Platelets: 119 10*3/uL — ABNORMAL LOW (ref 150–400)
RBC: 4.21 MIL/uL — AB (ref 4.22–5.81)
RDW: 15 % (ref 11.5–15.5)
WBC: 5.3 10*3/uL (ref 4.0–10.5)

## 2015-06-19 MED ORDER — SODIUM CHLORIDE 0.9 % IV BOLUS (SEPSIS)
1000.0000 mL | Freq: Once | INTRAVENOUS | Status: AC
  Administered 2015-06-19: 1000 mL via INTRAVENOUS

## 2015-06-19 MED ORDER — DIPHENHYDRAMINE HCL 25 MG PO TABS: 25.0000 mg | ORAL_TABLET | Freq: Every evening | ORAL | Status: DC | PRN

## 2015-06-19 MED ORDER — CHLORDIAZEPOXIDE HCL 25 MG PO CAPS: ORAL_CAPSULE | ORAL | Status: DC

## 2015-06-19 MED ORDER — CHLORDIAZEPOXIDE HCL 25 MG PO CAPS
50.0000 mg | ORAL_CAPSULE | Freq: Once | ORAL | Status: AC
  Administered 2015-06-19: 50 mg via ORAL
  Filled 2015-06-19: qty 2

## 2015-06-19 NOTE — ED Notes (Signed)
Called Thayer OhmChris son to pick up pt.

## 2015-06-19 NOTE — ED Provider Notes (Signed)
CSN: [id number]     Arrival date & time 06/19/15  1819 History   First MD Initiated Contact with Patient 06/19/15 1825     Chief Complaint  Patient presents with  . V70.1     (Consider location/radiation/quality/duration/timing/severity/associated sxs/prior Treatment) Patient is a 69 y.o. male presenting with intoxication.  Alcohol Intoxication This is a recurrent problem. The current episode started less than 1 hour ago. The problem occurs constantly. The problem has not changed since onset.Pertinent negatives include no chest pain, no abdominal pain, no headaches and no shortness of breath. Nothing aggravates the symptoms. Nothing relieves the symptoms. He has tried nothing for the symptoms. The treatment provided no relief.    Past Medical History  Diagnosis Date  . COPD (chronic obstructive pulmonary disease) (HCC)   . Hypertension   . Hepatic steatosis 12/14/2011  . Compression fracture of L1 lumbar vertebra (HCC) 12/14/2011  . C2 cervical fracture (HCC)   . Duodenal ulcer with hemorrhage 12/15/2011    Likely source of bleeding.per EGD; Dr. Karilyn Cota  . Esophageal varices without bleeding (HCC) 12/15/2011    per EGD  . Cirrhosis (HCC) 12/16/2011    per ultrasound  . Hx of substance abuse 12/17/2011  . 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 Hepatic encephalopathy (HCC)   . Helicobacter pylori antibody positive 12/19/2011  . Encephalopathy 05/18/2012  . UTI (urinary tract infection) 12/13/2011  . Ascites 05/18/2012    S/p paracentesis yielding 7880 mL  . Cirrhosis (HCC)   . Depression    Past Surgical History  Procedure Laterality Date  . Coronary angioplasty with stent placement     No family history on file. Social History  Substance Use Topics  . Smoking status: Current Every Day Smoker -- 1.00 packs/day    Types: Cigarettes  . Smokeless tobacco: Current User     Comment: 1 1/2 pack a day  . Alcohol Use: Yes     Comment: drinking  everyday.    Review of Systems  Constitutional: Negative for fever and chills.  HENT: Negative for rhinorrhea.   Respiratory: Negative for cough and shortness of breath.   Cardiovascular: Negative for chest pain and palpitations.  Gastrointestinal: Negative for nausea, vomiting and abdominal pain.  Endocrine: Negative for polydipsia and polyuria.  Genitourinary: Negative for dysuria, urgency, flank pain and penile pain.  Skin: Negative for wound.  Neurological: Negative for syncope and headaches.  Psychiatric/Behavioral: Positive for dysphoric mood and decreased concentration. Negative for suicidal ideas, behavioral problems and agitation.  All other systems reviewed and are negative.     Allergies  Review of patient's allergies indicates no known allergies.  Home Medications   Prior to Admission medications   Medication Sig Start Date End Date Taking? Authorizing Provider  albuterol (PROVENTIL) (2.5 MG/3ML) 0.083% nebulizer solution Take 3 mLs (2.5 mg total) by nebulization every 6 (six) hours as needed for wheezing or shortness of breath. 06/10/15  Yes Oneta Rack, NP  hydroxypropyl methylcellulose / hypromellose (ISOPTO TEARS / GONIOVISC) 2.5 % ophthalmic solution Place 1 drop into both eyes as needed for dry eyes.   Yes Historical Provider, MD  ibuprofen (ADVIL,MOTRIN) 200 MG tablet Take 800 mg by mouth every 6 (six) hours as needed for mild pain.   Yes Historical Provider, MD  omeprazole (PRILOSEC) 20 MG capsule Take 20 mg by mouth daily.   Yes Historical Provider, MD  chlordiazePOXIDE (LIBRIUM) 25 MG capsule  PO TID x 1D, then 25-50mg  PO BID X 1D,  then 25-50mg  PO QD X 1D 06/19/15   Marily MemosJason Aadyn Buchheit, MD  diphenhydrAMINE (BENADRYL) 25 MG tablet Take 1 tablet (25 mg total) by mouth at bedtime as needed for sleep. 06/19/15   Marily MemosJason Stacy Sailer, MD  Multiple Vitamin (MULTIVITAMIN WITH MINERALS) TABS tablet Take 1 tablet by mouth daily. 06/10/15   Oneta Rackanika N Lewis, NP   BP 162/94 mmHg   Pulse 86  Temp(Src) 97.7 F (36.5 C) (Oral)  Resp 20  Ht 6\' 1"  (1.854 m)  Wt 210 lb (95.255 kg)  BMI 27.71 kg/m2  SpO2 96% Physical Exam  Constitutional: He is oriented to person, place, and time. He appears well-developed and well-nourished.  HENT:  Head: Normocephalic and atraumatic.  Neck: Normal range of motion.  Cardiovascular: Normal rate and regular rhythm.   Pulmonary/Chest: Effort normal and breath sounds normal. No respiratory distress.  Abdominal: Soft. Bowel sounds are normal. He exhibits no distension. There is no tenderness.  Musculoskeletal: Normal range of motion. He exhibits no edema or tenderness.  Neurological: He is alert and oriented to person, place, and time.  Psychiatric: His speech is not rapid and/or pressured and not slurred. He is withdrawn. Thought content is not paranoid and not delusional. He does not express impulsivity. He exhibits a depressed mood. He expresses no homicidal and no suicidal ideation.  Nursing note and vitals reviewed.   ED Course  Procedures (including critical care time) Labs Review Labs Reviewed  COMPREHENSIVE METABOLIC PANEL - Abnormal; Notable for the following:    Calcium 8.3 (*)    AST 69 (*)    All other components within normal limits  CBC WITH DIFFERENTIAL/PLATELET - Abnormal; Notable for the following:    RBC 4.21 (*)    MCV 100.5 (*)    MCH 34.4 (*)    Platelets 119 (*)    All other components within normal limits    Imaging Review No results found. I have personally reviewed and evaluated these images and lab results as part of my medical decision-making.   EKG Interpretation None      MDM   Final diagnoses:  Alcohol abuse   Intoxicated. Realizes his life is screwed up because of it. No acute complaints aside from tremors and anxiety. Wants to quit drinking. Exam ok. No SI/HI/AVH or other symptoms. Will start on librium and check basic labs to ensure no metabolic abnormalties and when clinically sober  will dc on librium taper.   Able to ambulate without difficulty. Still no SI/HI or AVH. No acute need for hospitalization or IVC. Will dc on librium taper.   Apparently son called and upset we are not admitting his father. The son stated to the nurse "If he becomes suicidal, will yall keep him?", then asked to speak to his father prior to discharge. I spoke to [name] again prior to discharge and he was still not having suicidal thoughts, just sad that he was an alcoholic. Query manipulation by son for some unclear gain.     Marily MemosJason Lacrystal Barbe, MD 06/19/15 2214

## 2015-06-19 NOTE — ED Notes (Signed)
Son coming to pick pt up.  Pt provided with out patient drug and alcohol treatment facilities.  Pt wheeled to waiting room to wait for ride

## 2015-06-19 NOTE — ED Notes (Addendum)
Thayer OhmChris, son of patient, called to check on pt.  States that father has been drinking and doing pills and other drugs.  Son is concerned that pt will not be admitted for treatment and states "if he becomes suicidal will yall keep him then?"  Informed son that I could not discuss pts treatment with him.  Son asked to speak with pt, informed him pt was resting.

## 2015-06-19 NOTE — ED Notes (Signed)
[facility]. States he began drinking again after getting home. States he has been drinking all week and had a 12 pack today. Pt is tearful on arrival.

## 2015-07-13 ENCOUNTER — Emergency Department (HOSPITAL_COMMUNITY)
Admission: EM | Admit: 2015-07-13 | Discharge: 2015-07-13 | Disposition: A | Discharge status: Home / Self Care | Payer: Medicare Other | Attending: Emergency Medicine | Admitting: Emergency Medicine

## 2015-07-13 ENCOUNTER — Encounter (HOSPITAL_COMMUNITY): Payer: Self-pay | Admitting: Emergency Medicine

## 2015-07-13 DIAGNOSIS — Y998 Other external cause status: Secondary | ICD-10-CM | POA: Diagnosis not present

## 2015-07-13 DIAGNOSIS — S81811A Laceration without foreign body, right lower leg, initial encounter: Secondary | ICD-10-CM | POA: Diagnosis not present

## 2015-07-13 DIAGNOSIS — I10 Essential (primary) hypertension: Secondary | ICD-10-CM | POA: Insufficient documentation

## 2015-07-13 DIAGNOSIS — Z8781 Personal history of (healed) traumatic fracture: Secondary | ICD-10-CM | POA: Insufficient documentation

## 2015-07-13 DIAGNOSIS — Z8719 Personal history of other diseases of the digestive system: Secondary | ICD-10-CM | POA: Diagnosis not present

## 2015-07-13 DIAGNOSIS — J449 Chronic obstructive pulmonary disease, unspecified: Secondary | ICD-10-CM | POA: Insufficient documentation

## 2015-07-13 DIAGNOSIS — S8991XA Unspecified injury of right lower leg, initial encounter: Secondary | ICD-10-CM | POA: Diagnosis present

## 2015-07-13 DIAGNOSIS — S91312A Laceration without foreign body, left foot, initial encounter: Secondary | ICD-10-CM | POA: Insufficient documentation

## 2015-07-13 DIAGNOSIS — S71111A Laceration without foreign body, right thigh, initial encounter: Secondary | ICD-10-CM | POA: Diagnosis not present

## 2015-07-13 DIAGNOSIS — Z23 Encounter for immunization: Secondary | ICD-10-CM | POA: Insufficient documentation

## 2015-07-13 DIAGNOSIS — Z87440 Personal history of urinary (tract) infections: Secondary | ICD-10-CM | POA: Diagnosis not present

## 2015-07-13 DIAGNOSIS — F17210 Nicotine dependence, cigarettes, uncomplicated: Secondary | ICD-10-CM | POA: Diagnosis not present

## 2015-07-13 DIAGNOSIS — Y9389 Activity, other specified: Secondary | ICD-10-CM | POA: Insufficient documentation

## 2015-07-13 DIAGNOSIS — T07XXXA Unspecified multiple injuries, initial encounter: Secondary | ICD-10-CM

## 2015-07-13 DIAGNOSIS — Z8619 Personal history of other infectious and parasitic diseases: Secondary | ICD-10-CM | POA: Diagnosis not present

## 2015-07-13 DIAGNOSIS — W1839XA Other fall on same level, initial encounter: Secondary | ICD-10-CM | POA: Diagnosis not present

## 2015-07-13 DIAGNOSIS — F0390 Unspecified dementia without behavioral disturbance: Secondary | ICD-10-CM | POA: Insufficient documentation

## 2015-07-13 DIAGNOSIS — Z79899 Other long term (current) drug therapy: Secondary | ICD-10-CM | POA: Diagnosis not present

## 2015-07-13 DIAGNOSIS — Y9289 Other specified places as the place of occurrence of the external cause: Secondary | ICD-10-CM | POA: Insufficient documentation

## 2015-07-13 MED ORDER — GI COCKTAIL ~~LOC~~
30.0000 mL | Freq: Once | ORAL | Status: AC
  Administered 2015-07-13: 30 mL via ORAL
  Filled 2015-07-13: qty 30

## 2015-07-13 MED ORDER — TETANUS-DIPHTH-ACELL PERTUSSIS 5-2.5-18.5 LF-MCG/0.5 IM SUSP
0.5000 mL | Freq: Once | INTRAMUSCULAR | Status: AC
  Administered 2015-07-13: 0.5 mL via INTRAMUSCULAR
  Filled 2015-07-13: qty 0.5

## 2015-07-13 MED ORDER — CEPHALEXIN 500 MG PO CAPS: 500.0000 mg | ORAL_CAPSULE | Freq: Four times a day (QID) | ORAL | Status: DC

## 2015-07-13 NOTE — ED Notes (Signed)
D/c papers given and reviewed, stressed importance of finishing antibiotic, wound care, and the need to stop drinking. Pt. Verbalized understanding and wants to wait in lobby for his brother. Patient dressed and wheeled to lobby per request. Brother called and states he will come pick up patient.

## 2015-07-13 NOTE — ED Notes (Signed)
Pt. Reports drinking this am.

## 2015-07-13 NOTE — ED Notes (Signed)
Pt states that he fell on Thursday and has lacerations to right leg.  Has been drinking alcohol for the past few days.

## 2015-07-13 NOTE — Discharge Instructions (Signed)

## 2015-07-13 NOTE — ED Notes (Signed)
IV removed and AC area wrapped with kling guaze.

## 2015-07-13 NOTE — ED Provider Notes (Signed)
CSN: [id number]     Arrival date & time 07/13/15  0818 History   First MD Initiated Contact with Patient 07/13/15 [phone]     Chief Complaint  Patient presents with  . Fall     (Consider location/radiation/quality/duration/timing/severity/associated sxs/prior Treatment) Patient is a 69 y.o. male presenting with fall. The history is provided by the patient. No language interpreter was used.  Fall This is a new problem. The current episode started today. The problem occurs constantly. The problem has been gradually worsening. Pertinent negatives include no fever, joint swelling or myalgias. Nothing aggravates the symptoms. He has tried nothing for the symptoms. The treatment provided no relief.  Pt fell on Thursday 12/8 in the shower.  Pt has a cut on his left foot, right lower leg and right upper leg,   Pt admits to etoh Past Medical History  Diagnosis Date  . COPD (chronic obstructive pulmonary disease) (HCC)   . Hypertension   . Hepatic steatosis 12/14/2011  . Compression fracture of L1 lumbar vertebra (HCC) 12/14/2011  . C2 cervical fracture (HCC)   . Duodenal ulcer with hemorrhage 12/15/2011    Likely source of bleeding.per EGD; Dr. Karilyn Cotaehman  . Esophageal varices without bleeding (HCC) 12/15/2011    per EGD  . Cirrhosis (HCC) 12/16/2011    per ultrasound  . Hx of substance abuse 12/17/2011  . 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 Hepatic encephalopathy (HCC)   . Helicobacter pylori antibody positive 12/19/2011  . Encephalopathy 05/18/2012  . UTI (urinary tract infection) 12/13/2011  . Ascites 05/18/2012    S/p paracentesis yielding 7880 mL  . Cirrhosis (HCC)   . Depression    Past Surgical History  Procedure Laterality Date  . Coronary angioplasty with stent placement     History reviewed. No pertinent family history. Social History  Substance Use Topics  . Smoking status: Current Every Day Smoker -- 1.00 packs/day    Types: Cigarettes   . Smokeless tobacco: Current User     Comment: 1 1/2 pack a day  . Alcohol Use: Yes     Comment: drinking everyday.    Review of Systems  Constitutional: Negative for fever.  Musculoskeletal: Negative for myalgias and joint swelling.  All other systems reviewed and are negative.     Allergies  Review of patient's allergies indicates no known allergies.  Home Medications   Prior to Admission medications   Medication Sig Start Date End Date Taking? Authorizing Provider  albuterol (PROVENTIL) (2.5 MG/3ML) 0.083% nebulizer solution Take 3 mLs (2.5 mg total) by nebulization every 6 (six) hours as needed for wheezing or shortness of breath. 06/10/15   Oneta Rackanika N Lewis, NP  chlordiazePOXIDE (LIBRIUM) 25 MG capsule 50mg  PO TID x 1D, then 25-50mg  PO BID X 1D, then 25-50mg  PO QD X 1D 06/19/15   Marily MemosJason Mesner, MD  diphenhydrAMINE (BENADRYL) 25 MG tablet Take 1 tablet (25 mg total) by mouth at bedtime as needed for sleep. 06/19/15   Marily MemosJason Mesner, MD  hydroxypropyl methylcellulose / hypromellose (ISOPTO TEARS / GONIOVISC) 2.5 % ophthalmic solution Place 1 drop into both eyes as needed for dry eyes.    Historical Provider, MD  ibuprofen (ADVIL,MOTRIN) 200 MG tablet Take 800 mg by mouth every 6 (six) hours as needed for mild pain.    Historical Provider, MD  Multiple Vitamin (MULTIVITAMIN WITH MINERALS) TABS tablet Take 1 tablet by mouth daily. 06/10/15   Oneta Rackanika N Lewis, NP  omeprazole (PRILOSEC) 20 MG capsule Take 20  mg by mouth daily.    Historical Provider, MD   BP 134/79 mmHg  Pulse 113  Temp(Src) 98.6 F (37 C) (Oral)  Resp 20  Ht  (1.854 m)  Wt 99.791 kg  BMI 29.03 kg/m2  SpO2 96% Physical Exam  Constitutional: He is oriented to person, place, and time. He appears well-developed and well-nourished.  HENT:  Head: Normocephalic.  Eyes: EOM are normal.  Neck: Normal range of motion.  Cardiovascular: Normal rate.   Pulmonary/Chest: Effort normal.  Abdominal: He exhibits no  distension.  Musculoskeletal: Normal range of motion.  Neurological: He is alert and oriented to person, place, and time.  Skin:  8cm gapping laceration right upper leg,  2 cm laceration right lower leg,  1.2 cm laceration base of left foot. Granulation tissue forming all wounds  Psychiatric: He has a normal mood and affect.  Nursing note and vitals reviewed.   ED Course  Procedures (including critical care time) Labs Review Labs Reviewed - No data to display  Imaging Review No results found. I have personally reviewed and evaluated these images and lab results as part of my medical decision-making.   EKG Interpretation None      MDM  Pt advised he needs to come in when he injures himself.   Wounds are 5 days ago. Dirty.   I wil place pt on Keflex.    Final diagnoses:  Multiple lacerations    Keflex An After Visit Summary was printed and given to the patient.    Lonia Skinner Hollister, PA-C 07/13/15 1610  Samuel Jester, DO 07/17/15 Marlyne Beards

## 2015-07-13 NOTE — ED Notes (Signed)
Pt. C/o epigastric pain and states that is the reason he really wanted to come to ER. PA notified, order for GI cocktail given.

## 2015-07-13 NOTE — ED Notes (Signed)
Wounds to right knee and ankle and left foot cleaned and dressed. Dressing supplies sent to with patient. Advised on wound care and follow up.

## 2015-07-22 ENCOUNTER — Encounter (INDEPENDENT_AMBULATORY_CARE_PROVIDER_SITE_OTHER): Payer: Self-pay | Admitting: Internal Medicine

## 2015-07-22 ENCOUNTER — Other Ambulatory Visit (INDEPENDENT_AMBULATORY_CARE_PROVIDER_SITE_OTHER): Payer: Self-pay | Admitting: Internal Medicine

## 2015-07-22 ENCOUNTER — Ambulatory Visit (INDEPENDENT_AMBULATORY_CARE_PROVIDER_SITE_OTHER): Payer: Medicare Other | Admitting: Internal Medicine

## 2015-07-22 ENCOUNTER — Ambulatory Visit (HOSPITAL_COMMUNITY)
Admission: RE | Admit: 2015-07-22 | Discharge: 2015-07-22 | Disposition: A | Discharge status: Home / Self Care | Payer: Medicare Other | Source: Ambulatory Visit | Attending: Internal Medicine | Admitting: Internal Medicine

## 2015-07-22 VITALS — BP 136/70 | HR 72 | Temp 98.1°F | Ht 71.0 in | Wt 226.2 lb

## 2015-07-22 DIAGNOSIS — R188 Other ascites: Secondary | ICD-10-CM | POA: Insufficient documentation

## 2015-07-22 DIAGNOSIS — K7031 Alcoholic cirrhosis of liver with ascites: Secondary | ICD-10-CM | POA: Insufficient documentation

## 2015-07-22 LAB — COMPREHENSIVE METABOLIC PANEL
ALBUMIN: 2.6 g/dL — AB (ref 3.6–5.1)
ALT: 62 U/L — ABNORMAL HIGH (ref 9–46)
AST: 94 U/L — AB (ref 10–35)
Alkaline Phosphatase: 91 U/L (ref 40–115)
BILIRUBIN TOTAL: 1.5 mg/dL — AB (ref 0.2–1.2)
BUN: 12 mg/dL (ref 7–25)
CHLORIDE: 106 mmol/L (ref 98–110)
CO2: 23 mmol/L (ref 20–31)
CREATININE: 1.02 mg/dL (ref 0.70–1.25)
Calcium: 7.5 mg/dL — ABNORMAL LOW (ref 8.6–10.3)
Glucose, Bld: 87 mg/dL (ref 65–99)
Potassium: 4.3 mmol/L (ref 3.5–5.3)
SODIUM: 135 mmol/L (ref 135–146)
Total Protein: 6.9 g/dL (ref 6.1–8.1)

## 2015-07-22 LAB — CBC WITH DIFFERENTIAL/PLATELET
BASOS PCT: 2 % — AB (ref 0–1)
Basophils Absolute: 0.1 10*3/uL (ref 0.0–0.1)
EOS ABS: 0.2 10*3/uL (ref 0.0–0.7)
EOS PCT: 3 % (ref 0–5)
HCT: 27.3 % — ABNORMAL LOW (ref 39.0–52.0)
Hemoglobin: 9.4 g/dL — ABNORMAL LOW (ref 13.0–17.0)
Lymphocytes Relative: 27 % (ref 12–46)
Lymphs Abs: 1.8 10*3/uL (ref 0.7–4.0)
MCH: 36.3 pg — AB (ref 26.0–34.0)
MCHC: 34.4 g/dL (ref 30.0–36.0)
MCV: 105.4 fL — ABNORMAL HIGH (ref 78.0–100.0)
MONO ABS: 0.8 10*3/uL (ref 0.1–1.0)
MONOS PCT: 12 % (ref 3–12)
MPV: 10 fL (ref 8.6–12.4)
NEUTROS ABS: 3.6 10*3/uL (ref 1.7–7.7)
Neutrophils Relative %: 56 % (ref 43–77)
PLATELETS: 198 10*3/uL (ref 150–400)
RBC: 2.59 MIL/uL — AB (ref 4.22–5.81)
RDW: 16.3 % — AB (ref 11.5–15.5)
WBC: 6.5 10*3/uL (ref 4.0–10.5)

## 2015-07-22 MED ORDER — FUROSEMIDE 20 MG PO TABS: 20.0000 mg | ORAL_TABLET | Freq: Every day | ORAL | Status: DC

## 2015-07-22 MED ORDER — LACTULOSE 10 GM/15ML PO SOLN: 10.0000 g | Freq: Three times a day (TID) | ORAL | Status: DC

## 2015-07-22 MED ORDER — SPIRONOLACTONE 25 MG PO TABS: 25.0000 mg | ORAL_TABLET | Freq: Every day | ORAL | Status: DC

## 2015-07-22 NOTE — Patient Instructions (Addendum)
Labs and US paracentesis Rx's sent to his pharmacy
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
## 2015-07-23 LAB — AMMONIA: Ammonia: 69 umol/L — ABNORMAL HIGH (ref 16–53)

## 2015-07-23 LAB — AFP TUMOR MARKER: AFP TUMOR MARKER: 2.3 ng/mL (ref <6.1)

## 2015-07-27 ENCOUNTER — Telehealth (INDEPENDENT_AMBULATORY_CARE_PROVIDER_SITE_OTHER): Payer: Self-pay | Admitting: Internal Medicine

## 2015-07-27 NOTE — Telephone Encounter (Signed)
Needs OV in 6 weeks 

## 2015-07-27 NOTE — Telephone Encounter (Signed)
error 

## 2015-07-28 ENCOUNTER — Telehealth (INDEPENDENT_AMBULATORY_CARE_PROVIDER_SITE_OTHER): Payer: Self-pay | Admitting: Internal Medicine

## 2015-07-28 DIAGNOSIS — D62 Acute posthemorrhagic anemia: Secondary | ICD-10-CM

## 2015-07-28 LAB — CBC WITH DIFFERENTIAL/PLATELET
Basophils Absolute: 0.1 10*3/uL (ref 0.0–0.1)
Basophils Relative: 2 % — ABNORMAL HIGH (ref 0–1)
Eosinophils Absolute: 0.1 10*3/uL (ref 0.0–0.7)
Eosinophils Relative: 2 % (ref 0–5)
HCT: 29.1 % — ABNORMAL LOW (ref 39.0–52.0)
HEMOGLOBIN: 9.6 g/dL — AB (ref 13.0–17.0)
LYMPHS ABS: 1.6 10*3/uL (ref 0.7–4.0)
Lymphocytes Relative: 29 % (ref 12–46)
MCH: 35 pg — AB (ref 26.0–34.0)
MCHC: 33 g/dL (ref 30.0–36.0)
MCV: 106.2 fL — AB (ref 78.0–100.0)
MONOS PCT: 14 % — AB (ref 3–12)
MPV: 9.5 fL (ref 8.6–12.4)
Monocytes Absolute: 0.8 10*3/uL (ref 0.1–1.0)
NEUTROS ABS: 3 10*3/uL (ref 1.7–7.7)
NEUTROS PCT: 53 % (ref 43–77)
Platelets: 195 10*3/uL (ref 150–400)
RBC: 2.74 MIL/uL — ABNORMAL LOW (ref 4.22–5.81)
RDW: 15.3 % (ref 11.5–15.5)
WBC: 5.6 10*3/uL (ref 4.0–10.5)

## 2015-07-28 NOTE — Telephone Encounter (Signed)
Cbc ordered. He has had a drop in his hemoglobin.

## 2015-07-28 NOTE — Telephone Encounter (Signed)
OV sch'd 09/08/15 at 830, patient aware

## 2015-08-03 ENCOUNTER — Emergency Department (HOSPITAL_COMMUNITY)
Admission: EM | Admit: 2015-08-03 | Discharge: 2015-08-03 | Disposition: A | Discharge status: Home / Self Care | Payer: Medicare Other | Attending: Emergency Medicine | Admitting: Emergency Medicine

## 2015-08-03 ENCOUNTER — Encounter (HOSPITAL_COMMUNITY): Payer: Self-pay | Admitting: *Deleted

## 2015-08-03 ENCOUNTER — Emergency Department (HOSPITAL_COMMUNITY): Payer: Medicare Other

## 2015-08-03 DIAGNOSIS — I10 Essential (primary) hypertension: Secondary | ICD-10-CM | POA: Insufficient documentation

## 2015-08-03 DIAGNOSIS — Z8619 Personal history of other infectious and parasitic diseases: Secondary | ICD-10-CM | POA: Insufficient documentation

## 2015-08-03 DIAGNOSIS — S39011A Strain of muscle, fascia and tendon of abdomen, initial encounter: Secondary | ICD-10-CM

## 2015-08-03 DIAGNOSIS — Y9289 Other specified places as the place of occurrence of the external cause: Secondary | ICD-10-CM | POA: Diagnosis not present

## 2015-08-03 DIAGNOSIS — R300 Dysuria: Secondary | ICD-10-CM | POA: Insufficient documentation

## 2015-08-03 DIAGNOSIS — Z8659 Personal history of other mental and behavioral disorders: Secondary | ICD-10-CM | POA: Diagnosis not present

## 2015-08-03 DIAGNOSIS — R140 Abdominal distension (gaseous): Secondary | ICD-10-CM | POA: Diagnosis not present

## 2015-08-03 DIAGNOSIS — Y998 Other external cause status: Secondary | ICD-10-CM | POA: Diagnosis not present

## 2015-08-03 DIAGNOSIS — X58XXXA Exposure to other specified factors, initial encounter: Secondary | ICD-10-CM | POA: Diagnosis not present

## 2015-08-03 DIAGNOSIS — J449 Chronic obstructive pulmonary disease, unspecified: Secondary | ICD-10-CM | POA: Diagnosis not present

## 2015-08-03 DIAGNOSIS — Z87440 Personal history of urinary (tract) infections: Secondary | ICD-10-CM | POA: Diagnosis not present

## 2015-08-03 DIAGNOSIS — Z4801 Encounter for change or removal of surgical wound dressing: Secondary | ICD-10-CM | POA: Insufficient documentation

## 2015-08-03 DIAGNOSIS — Y9389 Activity, other specified: Secondary | ICD-10-CM | POA: Diagnosis not present

## 2015-08-03 DIAGNOSIS — Z8781 Personal history of (healed) traumatic fracture: Secondary | ICD-10-CM | POA: Insufficient documentation

## 2015-08-03 DIAGNOSIS — R197 Diarrhea, unspecified: Secondary | ICD-10-CM | POA: Diagnosis not present

## 2015-08-03 DIAGNOSIS — Z8719 Personal history of other diseases of the digestive system: Secondary | ICD-10-CM | POA: Diagnosis not present

## 2015-08-03 DIAGNOSIS — Z5189 Encounter for other specified aftercare: Secondary | ICD-10-CM

## 2015-08-03 LAB — CBC WITH DIFFERENTIAL/PLATELET
Basophils Absolute: 0.1 10*3/uL (ref 0.0–0.1)
Basophils Relative: 1 %
EOS ABS: 0.2 10*3/uL (ref 0.0–0.7)
Eosinophils Relative: 4 %
HEMATOCRIT: 31.6 % — AB (ref 39.0–52.0)
HEMOGLOBIN: 10.7 g/dL — AB (ref 13.0–17.0)
LYMPHS ABS: 1.3 10*3/uL (ref 0.7–4.0)
Lymphocytes Relative: 27 %
MCH: 36.1 pg — AB (ref 26.0–34.0)
MCHC: 33.9 g/dL (ref 30.0–36.0)
MCV: 106.8 fL — ABNORMAL HIGH (ref 78.0–100.0)
MONOS PCT: 11 %
Monocytes Absolute: 0.5 10*3/uL (ref 0.1–1.0)
NEUTROS ABS: 2.9 10*3/uL (ref 1.7–7.7)
NEUTROS PCT: 57 %
Platelets: 128 10*3/uL — ABNORMAL LOW (ref 150–400)
RBC: 2.96 MIL/uL — AB (ref 4.22–5.81)
RDW: 14.9 % (ref 11.5–15.5)
WBC: 5 10*3/uL (ref 4.0–10.5)

## 2015-08-03 LAB — URINALYSIS, ROUTINE W REFLEX MICROSCOPIC
Bilirubin Urine: NEGATIVE
GLUCOSE, UA: NEGATIVE mg/dL
Hgb urine dipstick: NEGATIVE
Ketones, ur: NEGATIVE mg/dL
Nitrite: NEGATIVE
PH: 6.5 (ref 5.0–8.0)
Protein, ur: NEGATIVE mg/dL

## 2015-08-03 LAB — COMPREHENSIVE METABOLIC PANEL
ALBUMIN: 2.7 g/dL — AB (ref 3.5–5.0)
ALK PHOS: 92 U/L (ref 38–126)
ALT: 29 U/L (ref 17–63)
ANION GAP: 5 (ref 5–15)
AST: 57 U/L — ABNORMAL HIGH (ref 15–41)
BILIRUBIN TOTAL: 1 mg/dL (ref 0.3–1.2)
BUN: 11 mg/dL (ref 6–20)
CALCIUM: 8.2 mg/dL — AB (ref 8.9–10.3)
CO2: 26 mmol/L (ref 22–32)
CREATININE: 1.08 mg/dL (ref 0.61–1.24)
Chloride: 108 mmol/L (ref 101–111)
GFR calc non Af Amer: >60 mL/min (ref >60)
GLUCOSE: 112 mg/dL — AB (ref 65–99)
Potassium: 3.9 mmol/L (ref 3.5–5.1)
SODIUM: 139 mmol/L (ref 135–145)
TOTAL PROTEIN: 7.5 g/dL (ref 6.5–8.1)

## 2015-08-03 LAB — URINE MICROSCOPIC-ADD ON
BACTERIA UA: NONE SEEN
RBC / HPF: NONE SEEN RBC/hpf (ref 0–5)
Squamous Epithelial / LPF: NONE SEEN

## 2015-08-03 LAB — PROTIME-INR
INR: 1.36 (ref 0.00–1.49)
Prothrombin Time: 16.8 seconds — ABNORMAL HIGH (ref 11.6–15.2)

## 2015-08-03 LAB — APTT: aPTT: 32 seconds (ref 24–37)

## 2015-08-03 LAB — LIPASE, BLOOD: Lipase: 28 U/L (ref 11–51)

## 2015-08-03 MED ORDER — HYDROCODONE-ACETAMINOPHEN 5-325 MG PO TABS: 1.0000 | ORAL_TABLET | Freq: Four times a day (QID) | ORAL | Status: DC | PRN

## 2015-08-03 MED ORDER — HYDROCODONE-ACETAMINOPHEN 5-325 MG PO TABS
1.0000 | ORAL_TABLET | Freq: Once | ORAL | Status: AC
  Administered 2015-08-03: 1 via ORAL
  Filled 2015-08-03: qty 1

## 2015-08-03 NOTE — ED Provider Notes (Signed)
CSN: [id number]     Arrival date & time 08/03/15  0750 History  By signing my name below, I, Roberto [name], attest that this documentation has been prepared under the direction and in the presence of No att. providers found. Electronically Signed: Marica [name], ED Scribe. 08/03/2015. 8:54 AM.  Chief Complaint  Patient presents with  . Wound Check  . Abdominal Pain   HPI  PCP: Roberto Stalling, MD HPI Comments: Roberto [name] is a 70 y.o. male, with PMHX noted below including COPD, HTN, cirrhosis, who presents to the Emergency Department with multiple complaints.  First, pt complains of pain to his right knee and right ankle onset 2 weeks ago. Pt specifies the pain originated after he sustained 2 lacerations one to the right knee and another to the right ankle in early December. Pt reports he was on Keflex for the lacerations for approximately one week before losing his Rx. Pt reports mild clear drainage from laceration sites. Pt denies fever, chills, night sweats, or increased redness or swelling.   Second, pt complains of chronic cough. Pt reports his current cough is consistent with his chronic smokers cough. Pt denies worsening coughing Sx or increased sputum. No chest pain or DOE.   Third, pt complains of rapid weight gain noting he gained thirty pounds in the past three weeks. Pt notes decreased physical activity may be a contributing factor. Denies abdominal distension, LE edema, orthopnea, PND.  Lastly, pt complains of intermittent, sharp upper abd pain onset three weeks ago. Exacerbating factors include going from a sitting position from a lying position, coughing, and movements involving the abd. Pt confirms mild dysuria and intermittent, mild diarrhea (pt notes he has frequent occurences of diarrhea at baseline). Pt denies n/v, or melena or hematochezia. No N/V or hematemesis. Notes that pain goes away when he does not move.   Past Medical History  Diagnosis Date  . COPD (chronic  obstructive pulmonary disease) (HCC)   . Hypertension   . Hepatic steatosis 12/14/2011  . Compression fracture of L1 lumbar vertebra (HCC) 12/14/2011  . C2 cervical fracture (HCC)   . Duodenal ulcer with hemorrhage 12/15/2011    Likely source of bleeding.per EGD; Dr. Karilyn Cota  . Esophageal varices without bleeding (HCC) 12/15/2011    per EGD  . Cirrhosis (HCC) 12/16/2011    per ultrasound  . Hx of substance abuse 12/17/2011  . 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 Hepatic encephalopathy (HCC)   . Helicobacter pylori antibody positive 12/19/2011  . Encephalopathy 05/18/2012  . UTI (urinary tract infection) 12/13/2011  . Ascites 05/18/2012    S/p paracentesis yielding 7880 mL  . Cirrhosis (HCC)   . Depression    Past Surgical History  Procedure Laterality Date  . Coronary angioplasty with stent placement     No family history on file. Social History  Substance Use Topics  . Smoking status: Current Every Day Smoker -- 1.00 packs/day    Types: Cigarettes  . Smokeless tobacco: Current User     Comment: 1 1/2 pack a day  . Alcohol Use: Yes     Comment: drinking everyday.    Review of Systems  Constitutional: Positive for unexpected weight change (30lbs weight gain in 3 weeks ). Negative for fever, chills and diaphoresis.  Respiratory: Positive for cough.   Gastrointestinal: Positive for abdominal pain and diarrhea. Negative for nausea, vomiting and blood in stool.  Genitourinary: Positive for dysuria.  Musculoskeletal: Positive for arthralgias (right knee and  right ankle).  Skin: Positive for wound (laceration to right knee and right ankle).  All other systems reviewed and are negative.   Allergies  Review of patient's allergies indicates no known allergies.  Home Medications   Prior to Admission medications   Medication Sig Start Date End Date Taking? Authorizing Provider  albuterol (PROVENTIL) (2.5 MG/3ML) 0.083% nebulizer solution Take  3 mLs (2.5 mg total) by nebulization every 6 (six) hours as needed for wheezing or shortness of breath. 06/10/15  Yes Roberto Rackanika N Lewis, NP  chlordiazePOXIDE (LIBRIUM) 25 MG capsule 50mg  PO TID x 1D, then 25-50mg  PO BID X 1D, then 25-50mg  PO QD X 1D 06/19/15  Yes Roberto MemosJason Mesner, MD  diphenhydrAMINE (BENADRYL) 25 MG tablet Take 1 tablet (25 mg total) by mouth at bedtime as needed for sleep. 06/19/15  Yes Roberto MemosJason Mesner, MD  hydroxypropyl methylcellulose / hypromellose (ISOPTO TEARS / GONIOVISC) 2.5 % ophthalmic solution Place 1 drop into both eyes as needed for dry eyes. Reported on 07/22/2015   Yes Historical Provider, MD  ibuprofen (ADVIL,MOTRIN) 200 MG tablet Take 800 mg by mouth every 6 (six) hours as needed for mild pain.   Yes Historical Provider, MD  Multiple Vitamin (MULTIVITAMIN WITH MINERALS) TABS tablet Take 1 tablet by mouth daily. 06/10/15  Yes Roberto Rackanika N Lewis, NP  omeprazole (PRILOSEC) 20 MG capsule Take 20 mg by mouth daily.   Yes Historical Provider, MD  spironolactone (ALDACTONE) 25 MG tablet Take 1 tablet (25 mg total) by mouth daily. 07/22/15  Yes Roberto Blalockerri L Setzer, NP  HYDROcodone-acetaminophen (NORCO/VICODIN) 5-325 MG tablet Take 1 tablet by mouth every 6 (six) hours as needed for severe pain. 08/03/15   Roberto Guiseana Duo Jrue Jarriel, MD   Triage Vitals: BP 147/85 mmHg  Pulse 75  Temp(Src) 98 F (36.7 C) (Oral)  Resp 20  Ht 6\' 1"  (1.854 m)  Wt 220 lb (99.791 kg)  BMI 29.03 kg/m2  SpO2 99% Physical Exam  Constitutional: He is oriented to person, place, and time. He appears well-developed and well-nourished.  HENT:  Head: Normocephalic.  Eyes: EOM are normal.  Neck: Normal range of motion.  Pulmonary/Chest: Effort normal and breath sounds normal. No respiratory distress. He has no wheezes.  Abdominal: He exhibits distension (mild distension). There is no tenderness (No tenderness with palpation. Notes tenderness with sitting up and coughing and position changes). There is no rebound and no guarding.   Obese abd with mild distension. Ventral hernia that is reducible. No significant abd tenderness to palpation Abd tenderness noted with positional movement and coughing.   Genitourinary: Guaiac negative stool (no melena or bloody stools on rectal exam. external hemorrhoid nonthrombosed present).  Musculoskeletal: Normal range of motion.  Right Medial Aspect of Knee: 7-8 cm gaping wound with no drainage, granulation tissue within gaping wound, and mild erythema in the borders. Over right medial malleolus 2cm gaping wound with granulation tissue and surrounding erythema over the borders. +2 DP pulses bilaterally.   Neurological: He is alert and oriented to person, place, and time.  Psychiatric: He has a normal mood and affect.  Nursing note and vitals reviewed.   ED Course  Procedures (including critical care time) DIAGNOSTIC STUDIES: Oxygen Saturation is 99% on ra, nl by my interpretation.    COORDINATION OF CARE: 8:38 AM: Discussed treatment plan which includes meds,  with pt at bedside; patient verbalizes understanding and agrees with treatment plan.  Labs Review Labs Reviewed  CBC WITH DIFFERENTIAL/PLATELET - Abnormal; Notable for the following:  RBC 2.96 (*)    Hemoglobin 10.7 (*)    HCT 31.6 (*)    MCV 106.8 (*)    MCH 36.1 (*)    Platelets 128 (*)    All other components within normal limits  COMPREHENSIVE METABOLIC PANEL - Abnormal; Notable for the following:    Glucose, Bld 112 (*)    Calcium 8.2 (*)    Albumin 2.7 (*)    AST 57 (*)    All other components within normal limits  PROTIME-INR - Abnormal; Notable for the following:    Prothrombin Time 16.8 (*)    All other components within normal limits  URINALYSIS, ROUTINE W REFLEX MICROSCOPIC (NOT AT [facility]) - Abnormal; Notable for the following:    Specific Gravity, Urine <1.005 (*)    Leukocytes, UA SMALL (*)    All other components within normal limits  LIPASE, BLOOD  APTT  URINE MICROSCOPIC-ADD ON    Imaging  Review Dg Chest 2 View  08/03/2015  CLINICAL DATA:  Mid epigastric pain. Fall 3 weeks ago. Difficulty breathing, COPD. EXAM: CHEST  2 VIEW COMPARISON:  06/01/2015 FINDINGS: Linear areas of subsegmental atelectasis in the lung bases. Heart is normal size. No effusions or pneumothorax. No acute bony abnormality. IMPRESSION: Bibasilar atelectasis. Electronically Signed   By: Charlett Nose M.D.   On: 08/03/2015 09:30   I have personally reviewed and evaluated these images and lab results as part of my medical decision-making.   EKG Interpretation None      MDM   Final diagnoses:  Visit for wound check  Strain of abdominal muscle, initial encounter    70 year old male with history of alcoholic cirrhosis complicated by history of hepatic encephalopathy and varices/PUD who presents with multiple complaints, chronic in nature. On arrival, he is well-appearing in no acute distress. His vital signs are non-concerning.  In regards to his chronic cough, his lungs are clear to auscultation bilaterally, without evidence of COPD exacerbation. No evidence of fluid overload. His chest x-ray shows no acute cardiopulmonary processes. States that his cough is unchanged from that of his baseline cough with the COPD and persistent tobacco use.  In regards to his abdominal pain. He does have prior history of cirrhosis, but without evidence of abdominal pain with palpation. Abdomen also is not significantly distended and he has a reducible ventral hernia. My bedside ultrasound reveals no significant ascites for paracentesis or concern for SBP. He states that he'll he notices his epigastric pain when he coughs and when he sits up, making this more likely musculoskeletal. In nature. He has baseline liver function, and unremarkable lipase, UA. He has anemia of 10.9, which is baseline for him compared to prior blood work performed this month. No history of GI bleed, and on my rectal exam, he has normal stool that is guaiac  negative. Pain unlikely to be from UGIB, and normal BUN/Cr ratio.   In regards to wound, open wounds with granulation tissues and mild erythema around the borders. Not suggestive of acute infectious processes. Given him warning signs for infection. Discussed wound care.  Overall no acute processes today and he felt to be stable for continued outpatient management of symptoms. Strict return and follow-up instructions reviewed. He expressed understanding of all discharge instructions and felt comfortable with the plan of care.   I personally performed the services described in this documentation, which was scribed in my presence. The recorded information has been reviewed and is accurate.Roberto Guise, MD 08/03/15  1621 

## 2015-08-03 NOTE — Discharge Instructions (Signed)
Please follow-up in 2-3 days with your PCP about pain control  Return for worsening symptoms, including black tarry or bloody bowel movements, worsening pain, vomiting and unable to keep down food/fluids, difficulty breathing, or any other symptoms concerning to you.

## 2015-08-03 NOTE — ED Notes (Signed)
Pt states he was here previously, a couple of weeks ago due to a fall in the bathtub. Pt had lacerations to right medial ankle and right knee. States he took antibiotics for about a week and then lost them. Also lost ointment. Pt states he had waited to long to come in to be sutured.  Pt also states discomfort to upper abdomen, especially when bending up from lying position, coughing, and certain movements. Described as a sharp pain when it occurs.

## 2015-08-27 ENCOUNTER — Emergency Department (HOSPITAL_COMMUNITY): Payer: Medicare Other

## 2015-08-27 ENCOUNTER — Inpatient Hospital Stay (HOSPITAL_COMMUNITY)
Admission: EM | Admit: 2015-08-27 | Discharge: 2015-09-13 | DRG: 853 | Disposition: A | Discharge status: Skilled Nursing Facility | Payer: Medicare Other | Attending: Internal Medicine | Admitting: Internal Medicine

## 2015-08-27 ENCOUNTER — Encounter (HOSPITAL_COMMUNITY): Payer: Self-pay | Admitting: Emergency Medicine

## 2015-08-27 DIAGNOSIS — M009 Pyogenic arthritis, unspecified: Secondary | ICD-10-CM

## 2015-08-27 DIAGNOSIS — K5900 Constipation, unspecified: Secondary | ICD-10-CM

## 2015-08-27 DIAGNOSIS — K760 Fatty (change of) liver, not elsewhere classified: Secondary | ICD-10-CM | POA: Diagnosis present

## 2015-08-27 DIAGNOSIS — Z419 Encounter for procedure for purposes other than remedying health state, unspecified: Secondary | ICD-10-CM

## 2015-08-27 DIAGNOSIS — Z955 Presence of coronary angioplasty implant and graft: Secondary | ICD-10-CM

## 2015-08-27 DIAGNOSIS — A4902 Methicillin resistant Staphylococcus aureus infection, unspecified site: Secondary | ICD-10-CM | POA: Insufficient documentation

## 2015-08-27 DIAGNOSIS — E440 Moderate protein-calorie malnutrition: Secondary | ICD-10-CM | POA: Insufficient documentation

## 2015-08-27 DIAGNOSIS — R29898 Other symptoms and signs involving the musculoskeletal system: Secondary | ICD-10-CM

## 2015-08-27 DIAGNOSIS — Z9114 Patient's other noncompliance with medication regimen: Secondary | ICD-10-CM

## 2015-08-27 DIAGNOSIS — F1010 Alcohol abuse, uncomplicated: Secondary | ICD-10-CM | POA: Diagnosis not present

## 2015-08-27 DIAGNOSIS — K7460 Unspecified cirrhosis of liver: Secondary | ICD-10-CM

## 2015-08-27 DIAGNOSIS — F17210 Nicotine dependence, cigarettes, uncomplicated: Secondary | ICD-10-CM | POA: Diagnosis present

## 2015-08-27 DIAGNOSIS — I38 Endocarditis, valve unspecified: Secondary | ICD-10-CM | POA: Diagnosis present

## 2015-08-27 DIAGNOSIS — F10230 Alcohol dependence with withdrawal, uncomplicated: Secondary | ICD-10-CM | POA: Diagnosis not present

## 2015-08-27 DIAGNOSIS — Z9119 Patient's noncompliance with other medical treatment and regimen: Secondary | ICD-10-CM

## 2015-08-27 DIAGNOSIS — D6959 Other secondary thrombocytopenia: Secondary | ICD-10-CM | POA: Diagnosis present

## 2015-08-27 DIAGNOSIS — D62 Acute posthemorrhagic anemia: Secondary | ICD-10-CM | POA: Diagnosis not present

## 2015-08-27 DIAGNOSIS — R188 Other ascites: Secondary | ICD-10-CM

## 2015-08-27 DIAGNOSIS — I5032 Chronic diastolic (congestive) heart failure: Secondary | ICD-10-CM | POA: Diagnosis present

## 2015-08-27 DIAGNOSIS — J449 Chronic obstructive pulmonary disease, unspecified: Secondary | ICD-10-CM | POA: Diagnosis present

## 2015-08-27 DIAGNOSIS — R0602 Shortness of breath: Secondary | ICD-10-CM | POA: Diagnosis not present

## 2015-08-27 DIAGNOSIS — R000 Tachycardia, unspecified: Secondary | ICD-10-CM

## 2015-08-27 DIAGNOSIS — I10 Essential (primary) hypertension: Secondary | ICD-10-CM | POA: Diagnosis present

## 2015-08-27 DIAGNOSIS — I110 Hypertensive heart disease with heart failure: Secondary | ICD-10-CM | POA: Diagnosis present

## 2015-08-27 DIAGNOSIS — R509 Fever, unspecified: Secondary | ICD-10-CM

## 2015-08-27 DIAGNOSIS — E871 Hypo-osmolality and hyponatremia: Secondary | ICD-10-CM | POA: Diagnosis not present

## 2015-08-27 DIAGNOSIS — K7031 Alcoholic cirrhosis of liver with ascites: Secondary | ICD-10-CM | POA: Diagnosis not present

## 2015-08-27 DIAGNOSIS — R109 Unspecified abdominal pain: Secondary | ICD-10-CM

## 2015-08-27 DIAGNOSIS — Z6828 Body mass index (BMI) 28.0-28.9, adult: Secondary | ICD-10-CM

## 2015-08-27 DIAGNOSIS — F10939 Alcohol use, unspecified with withdrawal, unspecified: Secondary | ICD-10-CM | POA: Diagnosis present

## 2015-08-27 DIAGNOSIS — I8500 Esophageal varices without bleeding: Secondary | ICD-10-CM | POA: Diagnosis present

## 2015-08-27 DIAGNOSIS — A4101 Sepsis due to Methicillin susceptible Staphylococcus aureus: Principal | ICD-10-CM | POA: Diagnosis present

## 2015-08-27 DIAGNOSIS — F10239 Alcohol dependence with withdrawal, unspecified: Secondary | ICD-10-CM | POA: Diagnosis present

## 2015-08-27 DIAGNOSIS — R140 Abdominal distension (gaseous): Secondary | ICD-10-CM

## 2015-08-27 DIAGNOSIS — I639 Cerebral infarction, unspecified: Secondary | ICD-10-CM

## 2015-08-27 DIAGNOSIS — E43 Unspecified severe protein-calorie malnutrition: Secondary | ICD-10-CM | POA: Diagnosis present

## 2015-08-27 DIAGNOSIS — N179 Acute kidney failure, unspecified: Secondary | ICD-10-CM | POA: Diagnosis not present

## 2015-08-27 DIAGNOSIS — G8220 Paraplegia, unspecified: Secondary | ICD-10-CM | POA: Diagnosis present

## 2015-08-27 DIAGNOSIS — G061 Intraspinal abscess and granuloma: Secondary | ICD-10-CM | POA: Diagnosis present

## 2015-08-27 DIAGNOSIS — K7290 Hepatic failure, unspecified without coma: Secondary | ICD-10-CM | POA: Diagnosis present

## 2015-08-27 DIAGNOSIS — G062 Extradural and subdural abscess, unspecified: Secondary | ICD-10-CM | POA: Diagnosis present

## 2015-08-27 LAB — PROTIME-INR
INR: 1.58 — ABNORMAL HIGH (ref 0.00–1.49)
PROTHROMBIN TIME: 18.9 s — AB (ref 11.6–15.2)

## 2015-08-27 LAB — COMPREHENSIVE METABOLIC PANEL
ALK PHOS: 114 U/L (ref 38–126)
ALT: 27 U/L (ref 17–63)
ANION GAP: 8 (ref 5–15)
AST: 86 U/L — ABNORMAL HIGH (ref 15–41)
Albumin: 2.5 g/dL — ABNORMAL LOW (ref 3.5–5.0)
BUN: 10 mg/dL (ref 6–20)
CALCIUM: 7.8 mg/dL — AB (ref 8.9–10.3)
CO2: 23 mmol/L (ref 22–32)
CREATININE: 1.02 mg/dL (ref 0.61–1.24)
Chloride: 104 mmol/L (ref 101–111)
Glucose, Bld: 120 mg/dL — ABNORMAL HIGH (ref 65–99)
Potassium: 4.2 mmol/L (ref 3.5–5.1)
Sodium: 135 mmol/L (ref 135–145)
Total Bilirubin: 0.9 mg/dL (ref 0.3–1.2)
Total Protein: 7.1 g/dL (ref 6.5–8.1)

## 2015-08-27 LAB — CBC WITH DIFFERENTIAL/PLATELET
BASOS PCT: 0 %
Basophils Absolute: 0 10*3/uL (ref 0.0–0.1)
EOS ABS: 0 10*3/uL (ref 0.0–0.7)
EOS PCT: 0 %
HCT: 32.1 % — ABNORMAL LOW (ref 39.0–52.0)
HEMOGLOBIN: 10.9 g/dL — AB (ref 13.0–17.0)
Lymphocytes Relative: 5 %
Lymphs Abs: 0.5 10*3/uL — ABNORMAL LOW (ref 0.7–4.0)
MCH: 32.6 pg (ref 26.0–34.0)
MCHC: 34 g/dL (ref 30.0–36.0)
MCV: 96.1 fL (ref 78.0–100.0)
MONO ABS: 0.5 10*3/uL (ref 0.1–1.0)
MONOS PCT: 5 %
Neutro Abs: 8.5 10*3/uL — ABNORMAL HIGH (ref 1.7–7.7)
Neutrophils Relative %: 90 %
PLATELETS: 76 10*3/uL — AB (ref 150–400)
RBC: 3.34 MIL/uL — ABNORMAL LOW (ref 4.22–5.81)
RDW: 14.8 % (ref 11.5–15.5)
Smear Review: DECREASED
WBC: 9.5 10*3/uL (ref 4.0–10.5)

## 2015-08-27 LAB — ETHANOL

## 2015-08-27 MED ORDER — ONDANSETRON HCL 4 MG PO TABS: 4.0000 mg | ORAL_TABLET | Freq: Four times a day (QID) | ORAL | Status: DC | PRN

## 2015-08-27 MED ORDER — ADULT MULTIVITAMIN W/MINERALS CH
1.0000 | ORAL_TABLET | Freq: Every day | ORAL | Status: DC
  Administered 2015-08-27 – 2015-09-07 (×11): 1 via ORAL
  Filled 2015-08-27 (×11): qty 1

## 2015-08-27 MED ORDER — FOLIC ACID 1 MG PO TABS
1.0000 mg | ORAL_TABLET | Freq: Every day | ORAL | Status: DC
  Administered 2015-08-27 – 2015-09-07 (×11): 1 mg via ORAL
  Filled 2015-08-27 (×11): qty 1

## 2015-08-27 MED ORDER — ONDANSETRON HCL 4 MG/2ML IJ SOLN: 4.0000 mg | Freq: Four times a day (QID) | INTRAMUSCULAR | Status: DC | PRN

## 2015-08-27 MED ORDER — LACTULOSE 10 GM/15ML PO SOLN
10.0000 g | Freq: Three times a day (TID) | ORAL | Status: DC
  Administered 2015-08-27 – 2015-09-01 (×15): 10 g via ORAL
  Filled 2015-08-27 (×15): qty 30

## 2015-08-27 MED ORDER — VITAMIN B-1 100 MG PO TABS
100.0000 mg | ORAL_TABLET | Freq: Every day | ORAL | Status: DC
  Administered 2015-08-27 – 2015-09-05 (×9): 100 mg via ORAL
  Filled 2015-08-27 (×9): qty 1

## 2015-08-27 MED ORDER — OXYCODONE HCL 5 MG PO TABS
10.0000 mg | ORAL_TABLET | Freq: Once | ORAL | Status: AC
Start: 2015-08-27 — End: 2015-08-27
  Administered 2015-08-27: 10 mg via ORAL
  Filled 2015-08-27: qty 2

## 2015-08-27 MED ORDER — SODIUM CHLORIDE 0.9% FLUSH
3.0000 mL | Freq: Two times a day (BID) | INTRAVENOUS | Status: DC
  Administered 2015-08-27 – 2015-09-05 (×10): 3 mL via INTRAVENOUS

## 2015-08-27 MED ORDER — LORAZEPAM 2 MG/ML IJ SOLN
4.0000 mg | Freq: Once | INTRAMUSCULAR | Status: AC
  Administered 2015-08-27: 4 mg via INTRAVENOUS
  Filled 2015-08-27: qty 2

## 2015-08-27 MED ORDER — SENNOSIDES-DOCUSATE SODIUM 8.6-50 MG PO TABS: 1.0000 | ORAL_TABLET | Freq: Every evening | ORAL | Status: DC | PRN

## 2015-08-27 MED ORDER — LORAZEPAM 2 MG/ML IJ SOLN
1.0000 mg | Freq: Once | INTRAMUSCULAR | Status: AC
  Administered 2015-08-27: 1 mg via INTRAVENOUS
  Filled 2015-08-27: qty 1

## 2015-08-27 MED ORDER — SODIUM CHLORIDE 0.9% FLUSH: 3.0000 mL | INTRAVENOUS | Status: DC | PRN

## 2015-08-27 MED ORDER — IPRATROPIUM-ALBUTEROL 0.5-2.5 (3) MG/3ML IN SOLN
3.0000 mL | Freq: Once | RESPIRATORY_TRACT | Status: AC
  Administered 2015-08-27: 3 mL via RESPIRATORY_TRACT
  Filled 2015-08-27: qty 3

## 2015-08-27 MED ORDER — FUROSEMIDE 20 MG PO TABS
20.0000 mg | ORAL_TABLET | Freq: Every day | ORAL | Status: DC
  Administered 2015-08-27 – 2015-09-01 (×6): 20 mg via ORAL
  Filled 2015-08-27 (×6): qty 1

## 2015-08-27 MED ORDER — LORAZEPAM 2 MG/ML IJ SOLN: 0.0000 mg | Freq: Two times a day (BID) | INTRAMUSCULAR | Status: AC

## 2015-08-27 MED ORDER — ALBUTEROL SULFATE (2.5 MG/3ML) 0.083% IN NEBU
2.5000 mg | INHALATION_SOLUTION | Freq: Four times a day (QID) | RESPIRATORY_TRACT | Status: DC | PRN
  Administered 2015-08-27 – 2015-08-30 (×4): 2.5 mg via RESPIRATORY_TRACT
  Filled 2015-08-27 (×4): qty 3

## 2015-08-27 MED ORDER — LORAZEPAM 2 MG/ML IJ SOLN
0.0000 mg | Freq: Four times a day (QID) | INTRAMUSCULAR | Status: AC
  Administered 2015-08-27 – 2015-08-29 (×6): 2 mg via INTRAVENOUS
  Filled 2015-08-27 (×6): qty 1

## 2015-08-27 MED ORDER — SODIUM CHLORIDE 0.9% FLUSH
3.0000 mL | Freq: Two times a day (BID) | INTRAVENOUS | Status: DC
  Administered 2015-08-28 – 2015-09-07 (×6): 3 mL via INTRAVENOUS

## 2015-08-27 MED ORDER — THIAMINE HCL 100 MG/ML IJ SOLN
100.0000 mg | Freq: Every day | INTRAMUSCULAR | Status: DC
  Administered 2015-09-03: 100 mg via INTRAVENOUS
  Filled 2015-08-27 (×2): qty 2

## 2015-08-27 MED ORDER — HYPROMELLOSE (GONIOSCOPIC) 2.5 % OP SOLN: 1.0000 [drp] | OPHTHALMIC | Status: DC | PRN

## 2015-08-27 MED ORDER — LORAZEPAM 2 MG/ML IJ SOLN
1.0000 mg | Freq: Four times a day (QID) | INTRAMUSCULAR | Status: AC | PRN
  Administered 2015-08-27 – 2015-08-28 (×2): 1 mg via INTRAVENOUS
  Filled 2015-08-27 (×2): qty 1

## 2015-08-27 MED ORDER — SODIUM CHLORIDE 0.9 % IV SOLN
250.0000 mL | INTRAVENOUS | Status: DC | PRN
  Administered 2015-09-07: 21:00:00 via INTRAVENOUS

## 2015-08-27 MED ORDER — PANTOPRAZOLE SODIUM 40 MG PO TBEC
40.0000 mg | DELAYED_RELEASE_TABLET | Freq: Every day | ORAL | Status: DC
  Administered 2015-08-27 – 2015-09-07 (×11): 40 mg via ORAL
  Filled 2015-08-27 (×11): qty 1

## 2015-08-27 MED ORDER — SPIRONOLACTONE 25 MG PO TABS
25.0000 mg | ORAL_TABLET | Freq: Every day | ORAL | Status: DC
  Administered 2015-08-27 – 2015-09-01 (×6): 25 mg via ORAL
  Filled 2015-08-27 (×6): qty 1

## 2015-08-27 MED ORDER — OXYCODONE HCL 5 MG PO TABS
5.0000 mg | ORAL_TABLET | ORAL | Status: DC | PRN
  Administered 2015-08-27 – 2015-08-31 (×13): 5 mg via ORAL
  Filled 2015-08-27 (×13): qty 1

## 2015-08-27 MED ORDER — POLYVINYL ALCOHOL 1.4 % OP SOLN: 1.0000 [drp] | OPHTHALMIC | Status: DC | PRN

## 2015-08-27 MED ORDER — LORAZEPAM 1 MG PO TABS
1.0000 mg | ORAL_TABLET | Freq: Four times a day (QID) | ORAL | Status: AC | PRN
  Administered 2015-08-30 (×2): 1 mg via ORAL
  Filled 2015-08-27 (×2): qty 1

## 2015-08-27 NOTE — ED Notes (Signed)
Pt taken to US

## 2015-08-27 NOTE — ED Notes (Signed)
Pt reports "im gonna die from pain." Explained to pt that he cannot die from pain.  Pt continuously asks for pain medication and was informed again that Dr. Rubin Payor was not going to give patient any pain medication, as they have had this discussion a few times since pt has been in ED.  Pt repositioned.

## 2015-08-27 NOTE — ED Notes (Signed)
Pt reminded to keep pulse ox on finger.

## 2015-08-27 NOTE — Progress Notes (Signed)
Patient is agitated and says that he is in pain and oxycodone is not working. Rates 10/10. Patient's heart rate is in the 140s. PRN ativan has been given about 1 hour ago. Paged midlevel. Order received to give a one time dose of oxycodone . Also received a 1 time dose ativan  iv. Will continue to monitor.

## 2015-08-27 NOTE — ED Notes (Signed)
MD aware pt asking for pain medication.

## 2015-08-27 NOTE — ED Notes (Signed)
Pt. returned from XR. 

## 2015-08-27 NOTE — ED Notes (Signed)
Pt taken to XR.  

## 2015-08-27 NOTE — H&P (Signed)
[facility]          History and Physical    PCP:   Maricela Curet, MD   EDP: Davonna Belling, M.D.  Chief Complaint:  "Can't walk", right knee pain, shortness of breath, weakness  HPI: Patient is a 70 year old man with known alcoholic cirrhosis who is noncompliant with medical treatment who presents to the hospital today with a myriad of complaints as stated above. He states 2 weeks ago he fell in the shower and suffered a laceration on his left knee after which his knee has become quite swollen. He states that 3 days ago he went on a "drunk binge" during which he has taken anywhere from 12-18 beers a day. His last drink was 2 days ago. Upon my exam he appears visibly agitated and anxious, is tremorous, complains of severe right upper quadrant pain. We are asked to admit him for evaluation and management  Allergies:  No Known Allergies    Past Medical History  Diagnosis Date  . COPD (chronic obstructive pulmonary disease) ([place])   . Hypertension   . Hepatic steatosis 12/14/2011  . Compression fracture of L1 lumbar vertebra ([place]) 12/14/2011  . C2 cervical fracture ([place])   . Duodenal ulcer with hemorrhage 12/15/2011    Likely source of bleeding.per EGD; Dr. Laural Golden  . Esophageal varices without bleeding ([place]) 12/15/2011    per EGD  . Cirrhosis ([place]) 12/16/2011    per ultrasound  . Hx of substance abuse 12/17/2011  . Alcohol dependence with alcohol-induced persisting dementia ([place]) 12/18/2011  . Hepatic encephalopathy ([place])   . Helicobacter pylori antibody positive 12/19/2011  . Encephalopathy 05/18/2012  . UTI (urinary tract infection) 12/13/2011  . Ascites 05/18/2012    S/p paracentesis yielding 7880 mL  . Cirrhosis ([place])   . Depression     Past Surgical History  Procedure Laterality Date  . Coronary angioplasty with stent placement      Prior to Admission medications   Medication Sig Start Date End Date Taking? Authorizing Provider  albuterol  (PROVENTIL) (2.5 MG/3ML) 0.083% nebulizer solution Take 3 mLs (2.5 mg total) by nebulization every 6 (six) hours as needed for wheezing or shortness of breath. 06/10/15  Yes Derrill Center, NP  diphenhydrAMINE (BENADRYL) 25 MG tablet Take 1 tablet (25 mg total) by mouth at bedtime as needed for sleep. 06/19/15  Yes Merrily Pew, MD  furosemide (LASIX) 20 MG tablet Take 20 mg by mouth daily. 08/17/15  Yes Historical Provider, MD  HYDROcodone-acetaminophen (NORCO/VICODIN) 5-325 MG tablet Take 1 tablet by mouth every 6 (six) hours as needed for severe pain. 08/03/15  Yes Forde Dandy, MD  hydroxypropyl methylcellulose / hypromellose (ISOPTO TEARS / GONIOVISC) 2.5 % ophthalmic solution Place 1 drop into both eyes as needed for dry eyes. Reported on 07/22/2015   Yes Historical Provider, MD  ibuprofen (ADVIL,MOTRIN) 200 MG tablet Take 800 mg by mouth every 6 (six) hours as needed for mild pain.   Yes Historical Provider, MD  lactulose (CHRONULAC) 10 GM/15ML solution Take 15 mLs by mouth 3 (three) times daily as needed. 07/22/15  Yes Historical Provider, MD  Menthol-Methyl Salicylate (BENGAY ARTHRITIS FORMULA EX) Apply 1 application topically 3 (three) times daily as needed.   Yes Historical Provider, MD  omeprazole (PRILOSEC) 20 MG capsule Take 20 mg by mouth daily.   Yes Historical Provider, MD  Oxycodone HCl 10 MG TABS Take 10 mg by mouth  3 (three) times daily as needed. 08/11/15  Yes Historical Provider, MD  spironolactone (ALDACTONE) 25 MG tablet Take 1 tablet (25 mg total) by mouth daily. 07/22/15  Yes Butch Penny, NP  chlordiazePOXIDE (LIBRIUM) 25 MG capsule '50mg'$  PO TID x 1D, then 25-'50mg'$  PO BID X 1D, then 25-'50mg'$  PO QD X 1D Patient not taking: Reported on 08/27/2015 06/19/15   Merrily Pew, MD  Multiple Vitamin (MULTIVITAMIN WITH MINERALS) TABS tablet Take 1 tablet by mouth daily. Patient not taking: Reported on 08/27/2015 06/10/15   Derrill Center, NP    Social History:  reports that he has been  smoking Cigarettes.  He has been smoking about 1.00 pack per day. He uses smokeless tobacco. He reports that he drinks alcohol. He reports that he uses illicit drugs (Cocaine).  History reviewed. No pertinent family history.  Review of Systems:  Constitutional: Denies fever, chills, diaphoresis, appetite change and fatigue.  HEENT: Denies photophobia, eye pain, redness, hearing loss, ear pain, congestion, sore throat, rhinorrhea, sneezing, mouth sores, trouble swallowing, neck pain, neck stiffness and tinnitus.   Respiratory: Denies , cough, chest tightness,  and wheezing.   Cardiovascular: Denies chest pain, palpitations and leg swelling.  Gastrointestinal: Denies nausea, vomiting, abdominal pain, diarrhea, constipation, blood in stool and abdominal distention.  Genitourinary: Denies dysuria, urgency, frequency, hematuria, flank pain and difficulty urinating.  Endocrine: Denies: hot or cold intolerance, sweats, changes in hair or nails, polyuria, polydipsia. Musculoskeletal: Denies myalgias, back pain, joint swelling, arthralgias and gait problem.  Skin: Denies pallor, rash and wound.  Neurological: Denies dizziness, seizures, syncope, weakness, light-headedness, numbness and headaches.  Hematological: Denies adenopathy. Easy bruising, personal or family bleeding history  Psychiatric/Behavioral: Denies suicidal ideation, mood changes, confusion, nervousness, sleep disturbance and agitation   Physical Exam: Blood pressure 135/84, pulse 117, temperature 98.5 F (36.9 C), temperature source Oral, resp. rate 24, height 6' (1.829 m), weight 96.163 kg (212 lb), SpO2 99 %. General: Alert, awake, oriented 3, visibly anxious and agitated HEENT: Normocephalic, appears to have a bruise on his forehead, dry mucous membranes Neck: Supple, no JVD, no lymphadenopathy, no goiter Cardiovascular: Tachycardic, regular, no murmurs auscultated Lungs: Clear to auscultation bilaterally Abdomen: Distended,  tense, no rebound or guarding, positive Murphy sign, positive bowel sounds Extremities: Left clubbing, cyanosis or edema, right shows a small scabbed over laceration over his right knee approximately 1-1/2 inches in length has 1+ edema of his right calf down to the ankle, positive pulses Neurologic: Grossly intact and nonfocal, I have not ambulated him  Labs on Admission:  Results for orders placed or performed during the hospital encounter of 08/27/15 (from the past 48 hour(s))  Comprehensive metabolic panel     Status: Abnormal   Collection Time: 08/27/15  7:52 AM  Result Value Ref Range   Sodium 135 135 - 145 mmol/L   Potassium 4.2 3.5 - 5.1 mmol/L   Chloride 104 101 - 111 mmol/L   CO2 23 22 - 32 mmol/L   Glucose, Bld 120 (H) 65 - 99 mg/dL   BUN 10 6 - 20 mg/dL   Creatinine, Ser 1.02 0.61 - 1.24 mg/dL   Calcium 7.8 (L) 8.9 - 10.3 mg/dL   Total Protein 7.1 6.5 - 8.1 g/dL   Albumin 2.5 (L) 3.5 - 5.0 g/dL   AST 86 (H) 15 - 41 U/L   ALT 27 17 - 63 U/L   Alkaline Phosphatase 114 38 - 126 U/L   Total Bilirubin 0.9 0.3 - 1.2 mg/dL  GFR calc non Af Amer >60 >60 mL/min   GFR calc Af Amer >60 >60 mL/min    Comment: (NOTE) The eGFR has been calculated using the CKD EPI equation. This calculation has not been validated in all clinical situations. eGFR's persistently <60 mL/min signify possible Chronic Kidney Disease.    Anion gap 8 5 - 15  Ethanol     Status: None   Collection Time: 08/27/15  7:52 AM  Result Value Ref Range   Alcohol, Ethyl (B) <5 <5 mg/dL    Comment:        LOWEST DETECTABLE LIMIT FOR SERUM ALCOHOL IS 5 mg/dL FOR MEDICAL PURPOSES ONLY   Protime-INR     Status: Abnormal   Collection Time: 08/27/15  7:52 AM  Result Value Ref Range   Prothrombin Time 18.9 (H) 11.6 - 15.2 seconds   INR 1.58 (H) 0.00 - 1.49  CBC with Differential     Status: Abnormal   Collection Time: 08/27/15  7:52 AM  Result Value Ref Range   WBC 9.5 4.0 - 10.5 K/uL   RBC 3.34 (L) 4.22 - 5.81  MIL/uL   Hemoglobin 10.9 (L) 13.0 - 17.0 g/dL   HCT 32.1 (L) 39.0 - 52.0 %   MCV 96.1 78.0 - 100.0 fL   MCH 32.6 26.0 - 34.0 pg   MCHC 34.0 30.0 - 36.0 g/dL   RDW 14.8 11.5 - 15.5 %   Platelets 76 (L) 150 - 400 K/uL    Comment: SPECIMEN CHECKED FOR CLOTS PLATELET COUNT CONFIRMED BY SMEAR    Neutrophils Relative % 90 %   Neutro Abs 8.5 (H) 1.7 - 7.7 K/uL   Lymphocytes Relative 5 %   Lymphs Abs 0.5 (L) 0.7 - 4.0 K/uL   Monocytes Relative 5 %   Monocytes Absolute 0.5 0.1 - 1.0 K/uL   Eosinophils Relative 0 %   Eosinophils Absolute 0.0 0.0 - 0.7 K/uL   Basophils Relative 0 %   Basophils Absolute 0.0 0.0 - 0.1 K/uL   Smear Review PLATELETS APPEAR DECREASED     Radiological Exams on Admission: Dg Chest 2 View  08/27/2015  CLINICAL DATA:  Shortness of breath and chest pain EXAM: CHEST  2 VIEW COMPARISON:  August 03, 2015 and June 01, 2015 FINDINGS: There is no edema or consolidation. The heart size and pulmonary vascularity are normal. No adenopathy. There is stable anterior wedging of the T12 and L1 vertebral bodies. There is anterior wedging of the T8 vertebral body which was not present on prior study from approximately 3 months prior. There is a stent in the left anterior descending coronary artery. IMPRESSION: No edema or consolidation. Anterior wedging of the T8 vertebral body, a finding not present 3 months prior. Anterior wedging of the T12 and L1 vertebral bodies is stable. There is a stent in the left anterior descending coronary artery. Electronically Signed   By: Lowella Grip III M.D.   On: 08/27/2015 08:20   US Venous Img Lower Unilateral Right  08/27/2015  CLINICAL DATA:  Right lower extremity swelling. Right knee pain. Status post fall 3 weeks ago. EXAM: RIGHT LOWER EXTREMITY VENOUS DOPPLER ULTRASOUND TECHNIQUE: Gray-scale sonography with graded compression, as well as color Doppler and duplex ultrasound were performed to evaluate the lower extremity deep venous systems  from the level of the common femoral vein and including the common femoral, femoral, profunda femoral, popliteal and calf veins including the posterior tibial, peroneal and gastrocnemius veins when visible. The superficial great saphenous  vein was also interrogated. Spectral Doppler was utilized to evaluate flow at rest and with distal augmentation maneuvers in the common femoral, femoral and popliteal veins. COMPARISON:  None. FINDINGS: Contralateral Common Femoral Vein: Respiratory phasicity is normal and symmetric with the symptomatic side. No evidence of thrombus. Normal compressibility. Common Femoral Vein: No evidence of thrombus. Normal compressibility, respiratory phasicity and response to augmentation. Saphenofemoral Junction: No evidence of thrombus. Normal compressibility and flow on color Doppler imaging. Profunda Femoral Vein: No evidence of thrombus. Normal compressibility and flow on color Doppler imaging. Femoral Vein: No evidence of thrombus. Normal compressibility, respiratory phasicity and response to augmentation. Popliteal Vein: No evidence of thrombus. Normal compressibility, respiratory phasicity and response to augmentation. Calf Veins: No evidence of thrombus. Normal compressibility and flow on color Doppler imaging. Superficial Great Saphenous Vein: No evidence of thrombus. Normal compressibility and flow on color Doppler imaging. Venous Reflux:  None. Other Findings:  None. IMPRESSION: No evidence of deep venous thrombosis. Electronically Signed   By: Kathreen Devoid   On: 08/27/2015 09:53   Dg Knee Complete 4 Views Right  08/27/2015  CLINICAL DATA:  Recent fall with a right knee injury. Pain. Initial encounter. EXAM: RIGHT KNEE - COMPLETE 4+ VIEW COMPARISON:  None. FINDINGS: No acute bony or joint abnormality is identified. A loose body projects over the posterior aspect of the lateral compartment and measures 1.2 cm in diameter. Small joint effusion is noted. Atherosclerosis is  identified. IMPRESSION: No acute abnormality. Loose body posterior aspect of the lateral compartment. Small joint effusion. Electronically Signed   By: Inge Rise M.D.   On: 08/27/2015 14:00   Dg Abd 2 Views  08/27/2015  CLINICAL DATA:  Severe abdominal pain.  History of cirrhosis. EXAM: ABDOMEN - 2 VIEW COMPARISON:  Abdominal ultrasound 07/22/2015 FINDINGS: The bowel gas pattern is normal. There is no evidence of free air. No evidence of organomegaly radiographically. No radio-opaque calculi or other significant radiographic abnormality is seen. IMPRESSION: Negative. Electronically Signed   By: Fidela Salisbury M.D.   On: 08/27/2015 10:50    Assessment/Plan Principal Problem:   Cirrhosis ([place]) Active Problems:   HTN (hypertension)   Alcohol withdrawal ([place])   ETOH abuse    Alcohol withdrawal with alcohol abuse -Thiamine/folate. -CIWA protocol. -Cessation counseling provided.  Right upper quadrant abdominal pain/alcoholic cirrhosis -Acute abdominal series negative for acute pathology. -He is tender right over the liver which I believe is likely acute inflammation from his alcohol binge. -Surprisingly his LFTs are remarkably normal with the exception of a slightly elevated AST of 86. Albumin is low at 2.5 and INR is elevated at 1.6 indicating poor synthetic function.  Thrombocytopenia/coagulopathy -Secondary to cirrhosis, monitor, no heparin products.  DVT prophylaxis -SCDs  CODE STATUS -Full code     Time Spent on Admission: 85 minutes  HERNANDEZ ACOSTA,ESTELA [facility] Pager: [phone] 08/27/2015, 5:05 PM

## 2015-08-27 NOTE — ED Notes (Signed)
Attempted report x1. 

## 2015-08-27 NOTE — ED Notes (Signed)
Pt informed x2 that he is waiting on results and that doctor is not giving pain medication at this time, pt continues to apologize for "being mean".

## 2015-08-27 NOTE — ED Notes (Signed)
MD at bedside. 

## 2015-08-27 NOTE — ED Notes (Signed)
Pt given ice chips

## 2015-08-27 NOTE — ED Provider Notes (Signed)
CSN: [id number]     Arrival date & time 08/27/15  0706 History   First MD Initiated Contact with Patient 08/27/15 [phone]     Chief Complaint  Patient presents with  . Shortness of Breath     Patient is a 70 y.o. male presenting with shortness of breath. The history is provided by the patient.  Shortness of Breath Associated symptoms: abdominal pain   Associated symptoms: no vomiting    patient presents with right upper abdominal pain. Began yesterday. Has a history of ascites and cirrhosis. Has history of alcohol abuse. States he been clean for a month then began to start drinking again because he had known with him and he was bored. Around 3 weeks ago he had a fall and hit his head and his knee. Had lacerations or could not be closed. He states his right knee has began to hurt more and his right lower extremity is more swollen. States his abdomen is more swollen. States he needs something for pain. Also has some dull shortness of breath. No cough. No fevers. No dysuria.  Past Medical History  Diagnosis Date  . COPD (chronic obstructive pulmonary disease) (HCC)   . Hypertension   . Hepatic steatosis 12/14/2011  . Compression fracture of L1 lumbar vertebra (HCC) 12/14/2011  . C2 cervical fracture (HCC)   . Duodenal ulcer with hemorrhage 12/15/2011    Likely source of bleeding.per EGD; Dr. Karilyn Cota  . Esophageal varices without bleeding (HCC) 12/15/2011    per EGD  . Cirrhosis (HCC) 12/16/2011    per ultrasound  . Hx of substance abuse 12/17/2011  . 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 Hepatic encephalopathy (HCC)   . Helicobacter pylori antibody positive 12/19/2011  . Encephalopathy 05/18/2012  . UTI (urinary tract infection) 12/13/2011  . Ascites 05/18/2012    S/p paracentesis yielding 7880 mL  . Cirrhosis (HCC)   . Depression    Past Surgical History  Procedure Laterality Date  . Coronary angioplasty with stent placement     History reviewed.  No pertinent family history. Social History  Substance Use Topics  . Smoking status: Current Every Day Smoker -- 1.00 packs/day    Types: Cigarettes  . Smokeless tobacco: Current User     Comment: 1 1/2 pack a day  . Alcohol Use: Yes     Comment: drinking everyday.    Review of Systems  Constitutional: Positive for activity change and fatigue.  Respiratory: Positive for shortness of breath.   Cardiovascular: Positive for leg swelling.  Gastrointestinal: Positive for abdominal pain. Negative for nausea, vomiting, constipation and blood in stool.  Genitourinary: Negative for flank pain.  Musculoskeletal: Negative for back pain.  Skin: Positive for wound.  Neurological: Negative for tremors and weakness.  Psychiatric/Behavioral: Negative for confusion.      Allergies  Review of patient's allergies indicates no known allergies.  Home Medications   Prior to Admission medications   Medication Sig Start Date End Date Taking? Authorizing Provider  albuterol (PROVENTIL) (2.5 MG/3ML) 0.083% nebulizer solution Take 3 mLs (2.5 mg total) by nebulization every 6 (six) hours as needed for wheezing or shortness of breath. 06/10/15   Oneta Rack, NP  chlordiazePOXIDE (LIBRIUM) 25 MG capsule  PO TID x 1D, then 25-50mg  PO BID X 1D, then 25-50mg  PO QD X 1D 06/19/15   Marily Memos, MD  diphenhydrAMINE (BENADRYL) 25 MG tablet Take 1 tablet (25 mg total) by mouth at bedtime as needed for sleep. 06/19/15  Marily Memos, MD  HYDROcodone-acetaminophen (NORCO/VICODIN) 5-325 MG tablet Take 1 tablet by mouth every 6 (six) hours as needed for severe pain. 08/03/15   Lavera Guise, MD  hydroxypropyl methylcellulose / hypromellose (ISOPTO TEARS / GONIOVISC) 2.5 % ophthalmic solution Place 1 drop into both eyes as needed for dry eyes. Reported on 07/22/2015    Historical Provider, MD  ibuprofen (ADVIL,MOTRIN) 200 MG tablet Take 800 mg by mouth every 6 (six) hours as needed for mild pain.    Historical  Provider, MD  Multiple Vitamin (MULTIVITAMIN WITH MINERALS) TABS tablet Take 1 tablet by mouth daily. 06/10/15   Oneta Rack, NP  omeprazole (PRILOSEC) 20 MG capsule Take 20 mg by mouth daily.    Historical Provider, MD  spironolactone (ALDACTONE) 25 MG tablet Take 1 tablet (25 mg total) by mouth daily. 07/22/15   Brand Males Setzer, NP   BP 146/84 mmHg  Pulse 112  Temp(Src) 98.5 F (36.9 C) (Oral)  Resp 24  Ht 6' (1.829 m)  Wt 212 lb (96.163 kg)  BMI 28.75 kg/m2  SpO2 100% Physical Exam  Constitutional: He is oriented to person, place, and time. He appears well-developed.  HENT:  Head: Normocephalic.  Eyes: No scleral icterus.  Neck: Neck supple.  Cardiovascular:  Tachycardia  Pulmonary/Chest: Breath sounds normal.  Abdominal: He exhibits distension. There is tenderness.  Abdomen is distended. Reducible umbilical hernia. Tenderness somewhat diffusely but worse in right upper quadrant. Possible hepatomegaly  Musculoskeletal: He exhibits edema and tenderness.  Effusion right knee. Healing wound over the anterior knee. No drainage or erythema of the wound. Moderate edema to right lower extremity. Greater than left lower extremity.  Neurological: He is alert and oriented to person, place, and time.  Skin: Skin is warm.    ED Course  Procedures (including critical care time) Labs Review Labs Reviewed  COMPREHENSIVE METABOLIC PANEL - Abnormal; Notable for the following:    Glucose, Bld 120 (*)    Calcium 7.8 (*)    Albumin 2.5 (*)    AST 86 (*)    All other components within normal limits  PROTIME-INR - Abnormal; Notable for the following:    Prothrombin Time 18.9 (*)    INR 1.58 (*)    All other components within normal limits  CBC WITH DIFFERENTIAL/PLATELET - Abnormal; Notable for the following:    RBC 3.34 (*)    Hemoglobin 10.9 (*)    HCT 32.1 (*)    Platelets 76 (*)    Neutro Abs 8.5 (*)    Lymphs Abs 0.5 (*)    All other components within normal limits  ETHANOL     Imaging Review Dg Chest 2 View  08/27/2015  CLINICAL DATA:  Shortness of breath and chest pain EXAM: CHEST  2 VIEW COMPARISON:  August 03, 2015 and June 01, 2015 FINDINGS: There is no edema or consolidation. The heart size and pulmonary vascularity are normal. No adenopathy. There is stable anterior wedging of the T12 and L1 vertebral bodies. There is anterior wedging of the T8 vertebral body which was not present on prior study from approximately 3 months prior. There is a stent in the left anterior descending coronary artery. IMPRESSION: No edema or consolidation. Anterior wedging of the T8 vertebral body, a finding not present 3 months prior. Anterior wedging of the T12 and L1 vertebral bodies is stable. There is a stent in the left anterior descending coronary artery. Electronically Signed   By: Bretta Bang III M.D.  On: 08/27/2015 08:20   US Venous Img Lower Unilateral Right  08/27/2015  CLINICAL DATA:  Right lower extremity swelling. Right knee pain. Status post fall 3 weeks ago. EXAM: RIGHT LOWER EXTREMITY VENOUS DOPPLER ULTRASOUND TECHNIQUE: Gray-scale sonography with graded compression, as well as color Doppler and duplex ultrasound were performed to evaluate the lower extremity deep venous systems from the level of the common femoral vein and including the common femoral, femoral, profunda femoral, popliteal and calf veins including the posterior tibial, peroneal and gastrocnemius veins when visible. The superficial great saphenous vein was also interrogated. Spectral Doppler was utilized to evaluate flow at rest and with distal augmentation maneuvers in the common femoral, femoral and popliteal veins. COMPARISON:  None. FINDINGS: Contralateral Common Femoral Vein: Respiratory phasicity is normal and symmetric with the symptomatic side. No evidence of thrombus. Normal compressibility. Common Femoral Vein: No evidence of thrombus. Normal compressibility, respiratory phasicity and  response to augmentation. Saphenofemoral Junction: No evidence of thrombus. Normal compressibility and flow on color Doppler imaging. Profunda Femoral Vein: No evidence of thrombus. Normal compressibility and flow on color Doppler imaging. Femoral Vein: No evidence of thrombus. Normal compressibility, respiratory phasicity and response to augmentation. Popliteal Vein: No evidence of thrombus. Normal compressibility, respiratory phasicity and response to augmentation. Calf Veins: No evidence of thrombus. Normal compressibility and flow on color Doppler imaging. Superficial Great Saphenous Vein: No evidence of thrombus. Normal compressibility and flow on color Doppler imaging. Venous Reflux:  None. Other Findings:  None. IMPRESSION: No evidence of deep venous thrombosis. Electronically Signed   By: Elige Ko   On: 08/27/2015 09:53   Dg Knee Complete 4 Views Right  08/27/2015  CLINICAL DATA:  Recent fall with a right knee injury. Pain. Initial encounter. EXAM: RIGHT KNEE - COMPLETE 4+ VIEW COMPARISON:  None. FINDINGS: No acute bony or joint abnormality is identified. A loose body projects over the posterior aspect of the lateral compartment and measures 1.2 cm in diameter. Small joint effusion is noted. Atherosclerosis is identified. IMPRESSION: No acute abnormality. Loose body posterior aspect of the lateral compartment. Small joint effusion. Electronically Signed   By: Drusilla Kanner M.D.   On: 08/27/2015 14:00   Dg Abd 2 Views  08/27/2015  CLINICAL DATA:  Severe abdominal pain.  History of cirrhosis. EXAM: ABDOMEN - 2 VIEW COMPARISON:  Abdominal ultrasound 07/22/2015 FINDINGS: The bowel gas pattern is normal. There is no evidence of free air. No evidence of organomegaly radiographically. No radio-opaque calculi or other significant radiographic abnormality is seen. IMPRESSION: Negative. Electronically Signed   By: Ted Mcalpine M.D.   On: 08/27/2015 10:50   I have personally reviewed and evaluated  these images and lab results as part of my medical decision-making.   EKG Interpretation   Date/Time:  Friday August 27 2015 07:12:21 EST Ventricular Rate:  116 PR Interval:  151 QRS Duration: 89 QT Interval:  333 QTC Calculation: 463 R Axis:   28 Text Interpretation:  Sinus tachycardia Confirmed by Rubin Payor  MD, Harrold Donath  [phone]) on 08/27/2015 7:21:29 AM      MDM   Final diagnoses:  Abdominal pain  Alcohol dependence with uncomplicated withdrawal (HCC)  Tachycardia  Cirrhosis of liver with ascites, unspecified hepatic cirrhosis type [facility])    Patient presents with multiple complaints. Chest pain abdominal pain trouble breathing swelling in his leg knee pain. Also is been drinking heavily and stopped drinking. Kidney tachycardia. Negative Doppler of the leg. Lab work is near baseline. X-ray does not show obstruction.  He is unsteady with ambulation and unable to walk due to pain in the unsteadiness. Will admit to internal medicine.    Benjiman Core, MD 08/27/15 [phone]

## 2015-08-27 NOTE — ED Notes (Signed)
Attempted to walk pt on pulse ox, pt unable to stand without losing balance, pt placed back in bed, MD notified.

## 2015-08-27 NOTE — ED Notes (Signed)
Pt returned from XR, MD aware that pt has sob.

## 2015-08-27 NOTE — ED Notes (Signed)
Per EMS: Pt reports sob, abd pain in all four quadrants, hx cirrhosis of liver, right knee pain after a fall three weeks ago with swelling 144 cbg,

## 2015-08-27 NOTE — ED Notes (Signed)
Pt taken to Xr. Meal tray given.

## 2015-08-28 ENCOUNTER — Inpatient Hospital Stay (HOSPITAL_COMMUNITY): Payer: Medicare Other

## 2015-08-28 DIAGNOSIS — N289 Disorder of kidney and ureter, unspecified: Secondary | ICD-10-CM | POA: Diagnosis not present

## 2015-08-28 DIAGNOSIS — F10239 Alcohol dependence with withdrawal, unspecified: Secondary | ICD-10-CM | POA: Diagnosis present

## 2015-08-28 DIAGNOSIS — K7031 Alcoholic cirrhosis of liver with ascites: Secondary | ICD-10-CM | POA: Diagnosis present

## 2015-08-28 DIAGNOSIS — Z6828 Body mass index (BMI) 28.0-28.9, adult: Secondary | ICD-10-CM | POA: Diagnosis not present

## 2015-08-28 DIAGNOSIS — A4101 Sepsis due to Methicillin susceptible Staphylococcus aureus: Secondary | ICD-10-CM | POA: Diagnosis present

## 2015-08-28 DIAGNOSIS — D62 Acute posthemorrhagic anemia: Secondary | ICD-10-CM | POA: Diagnosis not present

## 2015-08-28 DIAGNOSIS — E871 Hypo-osmolality and hyponatremia: Secondary | ICD-10-CM | POA: Diagnosis not present

## 2015-08-28 DIAGNOSIS — I10 Essential (primary) hypertension: Secondary | ICD-10-CM | POA: Diagnosis not present

## 2015-08-28 DIAGNOSIS — F10230 Alcohol dependence with withdrawal, uncomplicated: Secondary | ICD-10-CM | POA: Diagnosis not present

## 2015-08-28 DIAGNOSIS — K760 Fatty (change of) liver, not elsewhere classified: Secondary | ICD-10-CM | POA: Diagnosis present

## 2015-08-28 DIAGNOSIS — I5032 Chronic diastolic (congestive) heart failure: Secondary | ICD-10-CM | POA: Diagnosis not present

## 2015-08-28 DIAGNOSIS — G062 Extradural and subdural abscess, unspecified: Secondary | ICD-10-CM | POA: Diagnosis not present

## 2015-08-28 DIAGNOSIS — M25461 Effusion, right knee: Secondary | ICD-10-CM | POA: Diagnosis not present

## 2015-08-28 DIAGNOSIS — R0602 Shortness of breath: Secondary | ICD-10-CM | POA: Diagnosis present

## 2015-08-28 DIAGNOSIS — I110 Hypertensive heart disease with heart failure: Secondary | ICD-10-CM | POA: Diagnosis present

## 2015-08-28 DIAGNOSIS — R011 Cardiac murmur, unspecified: Secondary | ICD-10-CM | POA: Diagnosis not present

## 2015-08-28 DIAGNOSIS — B9562 Methicillin resistant Staphylococcus aureus infection as the cause of diseases classified elsewhere: Secondary | ICD-10-CM | POA: Diagnosis not present

## 2015-08-28 DIAGNOSIS — Z9119 Patient's noncompliance with other medical treatment and regimen: Secondary | ICD-10-CM | POA: Diagnosis not present

## 2015-08-28 DIAGNOSIS — K7290 Hepatic failure, unspecified without coma: Secondary | ICD-10-CM | POA: Diagnosis present

## 2015-08-28 DIAGNOSIS — F1020 Alcohol dependence, uncomplicated: Secondary | ICD-10-CM | POA: Diagnosis not present

## 2015-08-28 DIAGNOSIS — Z955 Presence of coronary angioplasty implant and graft: Secondary | ICD-10-CM | POA: Diagnosis not present

## 2015-08-28 DIAGNOSIS — I8500 Esophageal varices without bleeding: Secondary | ICD-10-CM | POA: Diagnosis present

## 2015-08-28 DIAGNOSIS — R140 Abdominal distension (gaseous): Secondary | ICD-10-CM | POA: Diagnosis not present

## 2015-08-28 DIAGNOSIS — G061 Intraspinal abscess and granuloma: Secondary | ICD-10-CM | POA: Diagnosis present

## 2015-08-28 DIAGNOSIS — N179 Acute kidney failure, unspecified: Secondary | ICD-10-CM | POA: Diagnosis not present

## 2015-08-28 DIAGNOSIS — F1010 Alcohol abuse, uncomplicated: Secondary | ICD-10-CM | POA: Diagnosis not present

## 2015-08-28 DIAGNOSIS — K7030 Alcoholic cirrhosis of liver without ascites: Secondary | ICD-10-CM | POA: Diagnosis not present

## 2015-08-28 DIAGNOSIS — E43 Unspecified severe protein-calorie malnutrition: Secondary | ICD-10-CM | POA: Diagnosis present

## 2015-08-28 DIAGNOSIS — J449 Chronic obstructive pulmonary disease, unspecified: Secondary | ICD-10-CM | POA: Diagnosis present

## 2015-08-28 DIAGNOSIS — D6959 Other secondary thrombocytopenia: Secondary | ICD-10-CM | POA: Diagnosis present

## 2015-08-28 DIAGNOSIS — Z9114 Patient's other noncompliance with medication regimen: Secondary | ICD-10-CM | POA: Diagnosis not present

## 2015-08-28 DIAGNOSIS — G8220 Paraplegia, unspecified: Secondary | ICD-10-CM | POA: Diagnosis present

## 2015-08-28 DIAGNOSIS — I38 Endocarditis, valve unspecified: Secondary | ICD-10-CM | POA: Diagnosis present

## 2015-08-28 DIAGNOSIS — F17210 Nicotine dependence, cigarettes, uncomplicated: Secondary | ICD-10-CM | POA: Diagnosis present

## 2015-08-28 LAB — COMPREHENSIVE METABOLIC PANEL
ALK PHOS: 92 U/L (ref 38–126)
ALT: 30 U/L (ref 17–63)
AST: 103 U/L — ABNORMAL HIGH (ref 15–41)
Albumin: 2.2 g/dL — ABNORMAL LOW (ref 3.5–5.0)
Anion gap: 8 (ref 5–15)
BILIRUBIN TOTAL: 1 mg/dL (ref 0.3–1.2)
BUN: 22 mg/dL — ABNORMAL HIGH (ref 6–20)
CALCIUM: 7.8 mg/dL — AB (ref 8.9–10.3)
CO2: 21 mmol/L — AB (ref 22–32)
CREATININE: 1.52 mg/dL — AB (ref 0.61–1.24)
Chloride: 104 mmol/L (ref 101–111)
GFR, EST AFRICAN AMERICAN: 53 mL/min — AB (ref >60)
GFR, EST NON AFRICAN AMERICAN: 46 mL/min — AB (ref >60)
Glucose, Bld: 114 mg/dL — ABNORMAL HIGH (ref 65–99)
Potassium: 4.4 mmol/L (ref 3.5–5.1)
SODIUM: 133 mmol/L — AB (ref 135–145)
TOTAL PROTEIN: 6.4 g/dL — AB (ref 6.5–8.1)

## 2015-08-28 LAB — CBC
HCT: 29 % — ABNORMAL LOW (ref 39.0–52.0)
HEMATOCRIT: 30.8 % — AB (ref 39.0–52.0)
Hemoglobin: 9.8 g/dL — ABNORMAL LOW (ref 13.0–17.0)
Hemoglobin: 10.4 g/dL — ABNORMAL LOW (ref 13.0–17.0)
MCH: 32.8 pg (ref 26.0–34.0)
MCH: 32.5 pg (ref 26.0–34.0)
MCHC: 33.8 g/dL (ref 30.0–36.0)
MCHC: 33.8 g/dL (ref 30.0–36.0)
MCV: 97 fL (ref 78.0–100.0)
MCV: 96.3 fL (ref 78.0–100.0)
PLATELETS: 68 10*3/uL — AB (ref 150–400)
Platelets: 56 10*3/uL — ABNORMAL LOW (ref 150–400)
RBC: 2.99 MIL/uL — AB (ref 4.22–5.81)
RBC: 3.2 MIL/uL — ABNORMAL LOW (ref 4.22–5.81)
RDW: 15.4 % (ref 11.5–15.5)
RDW: 15.3 % (ref 11.5–15.5)
WBC: 18 10*3/uL — ABNORMAL HIGH (ref 4.0–10.5)
WBC: 13.3 10*3/uL — ABNORMAL HIGH (ref 4.0–10.5)

## 2015-08-28 LAB — MAGNESIUM: Magnesium: 1.7 mg/dL (ref 1.7–2.4)

## 2015-08-28 MED ORDER — DEXTROSE 5 % IV SOLN
INTRAVENOUS | Status: AC
  Filled 2015-08-28: qty 500

## 2015-08-28 MED ORDER — DEXTROSE 5 % IV SOLN
500.0000 mg | INTRAVENOUS | Status: DC
  Administered 2015-08-28: 500 mg via INTRAVENOUS
  Filled 2015-08-28 (×2): qty 500

## 2015-08-28 MED ORDER — DEXTROSE 5 % IV SOLN
1.0000 g | INTRAVENOUS | Status: DC
  Administered 2015-08-28: 1 g via INTRAVENOUS
  Filled 2015-08-28 (×2): qty 10

## 2015-08-28 MED ORDER — ACETAMINOPHEN 325 MG PO TABS
650.0000 mg | ORAL_TABLET | Freq: Four times a day (QID) | ORAL | Status: DC | PRN
  Administered 2015-08-28 – 2015-08-31 (×2): 650 mg via ORAL
  Filled 2015-08-28 (×2): qty 2

## 2015-08-28 MED ORDER — DEXTROSE 5 % IV SOLN
INTRAVENOUS | Status: AC
  Filled 2015-08-28: qty 10

## 2015-08-28 NOTE — Progress Notes (Signed)
Patient in obvious ethanol withdrawal tremulousness collection within normal parameters [place] stable and Ativan protocol was pain in her right knee is a loose body in the posterior aspect no certain what it is but we'll obtain also consult after he is medically stabilized continue Ativan protocol and Percocet 5 every 4 hours when necessary for pain clinically this does not appear to be a septic knee DERRIK MCEACHERN ZOX:[id number] DOB: May 12, 1946 DOA: 08/27/2015 PCP: Isabella Stalling, MD             Physical Exam: Blood pressure 122/65, pulse 105, temperature 98.6 F (37 C), temperature source Oral, resp. rate 20, height 6' (1.829 m), weight 212 lb (96.163 kg), SpO2 100 %. lungs clear to A&P no rales wheeze rhonchi heart regular rhythm no murmurs goes heaves rubs abdomen soft nontender bowel sounds normoactive diffuse bilateral leg swelling right greater than left and this Doppler was negative for DVT   Investigations:  No results found for this or any previous visit (from the past 240 hour(s)).   Basic Metabolic Panel:  Recent Labs  81/19/14 0752 08/28/15 0603  NA 135 133*  K 4.2 4.4  CL 104 104  CO2 23 21*  GLUCOSE 120* 114*  BUN 10 22*  CREATININE 1.02 1.52*  CALCIUM 7.8* 7.8*   Liver Function Tests:  Recent Labs  08/27/15 0752 08/28/15 0603  AST 86* 103*  ALT 27 30  ALKPHOS 114 92  BILITOT 0.9 1.0  PROT 7.1 6.4*  ALBUMIN 2.5* 2.2*     CBC:  Recent Labs  08/27/15 0752 08/28/15 0603  WBC 9.5 18.0*  NEUTROABS 8.5*  --   HGB 10.9* 9.8*  HCT 32.1* 29.0*  MCV 96.1 97.0  PLT 76* 68*    Dg Chest 2 View  08/27/2015  CLINICAL DATA:  Shortness of breath and chest pain EXAM: CHEST  2 VIEW COMPARISON:  August 03, 2015 and June 01, 2015 FINDINGS: There is no edema or consolidation. The heart size and pulmonary vascularity are normal. No adenopathy. There is stable anterior wedging of the T12 and L1 vertebral bodies. There is anterior wedging of the T8  vertebral body which was not present on prior study from approximately 3 months prior. There is a stent in the left anterior descending coronary artery. IMPRESSION: No edema or consolidation. Anterior wedging of the T8 vertebral body, a finding not present 3 months prior. Anterior wedging of the T12 and L1 vertebral bodies is stable. There is a stent in the left anterior descending coronary artery. Electronically Signed   By: Bretta Bang III M.D.   On: 08/27/2015 08:20   US Venous Img Lower Unilateral Right  08/27/2015  CLINICAL DATA:  Right lower extremity swelling. Right knee pain. Status post fall 3 weeks ago. EXAM: RIGHT LOWER EXTREMITY VENOUS DOPPLER ULTRASOUND TECHNIQUE: Gray-scale sonography with graded compression, as well as color Doppler and duplex ultrasound were performed to evaluate the lower extremity deep venous systems from the level of the common femoral vein and including the common femoral, femoral, profunda femoral, popliteal and calf veins including the posterior tibial, peroneal and gastrocnemius veins when visible. The superficial great saphenous vein was also interrogated. Spectral Doppler was utilized to evaluate flow at rest and with distal augmentation maneuvers in the common femoral, femoral and popliteal veins. COMPARISON:  None. FINDINGS: Contralateral Common Femoral Vein: Respiratory phasicity is normal and symmetric with the symptomatic side. No evidence of thrombus. Normal compressibility. Common Femoral Vein: No evidence of thrombus. Normal compressibility, respiratory  phasicity and response to augmentation. Saphenofemoral Junction: No evidence of thrombus. Normal compressibility and flow on color Doppler imaging. Profunda Femoral Vein: No evidence of thrombus. Normal compressibility and flow on color Doppler imaging. Femoral Vein: No evidence of thrombus. Normal compressibility, respiratory phasicity and response to augmentation. Popliteal Vein: No evidence of thrombus.  Normal compressibility, respiratory phasicity and response to augmentation. Calf Veins: No evidence of thrombus. Normal compressibility and flow on color Doppler imaging. Superficial Great Saphenous Vein: No evidence of thrombus. Normal compressibility and flow on color Doppler imaging. Venous Reflux:  None. Other Findings:  None. IMPRESSION: No evidence of deep venous thrombosis. Electronically Signed   By: Elige Ko   On: 08/27/2015 09:53   Dg Knee Complete 4 Views Right  08/27/2015  CLINICAL DATA:  Recent fall with a right knee injury. Pain. Initial encounter. EXAM: RIGHT KNEE - COMPLETE 4+ VIEW COMPARISON:  None. FINDINGS: No acute bony or joint abnormality is identified. A loose body projects over the posterior aspect of the lateral compartment and measures 1.2 cm in diameter. Small joint effusion is noted. Atherosclerosis is identified. IMPRESSION: No acute abnormality. Loose body posterior aspect of the lateral compartment. Small joint effusion. Electronically Signed   By: Drusilla Kanner M.D.   On: 08/27/2015 14:00   Dg Abd 2 Views  08/27/2015  CLINICAL DATA:  Severe abdominal pain.  History of cirrhosis. EXAM: ABDOMEN - 2 VIEW COMPARISON:  Abdominal ultrasound 07/22/2015 FINDINGS: The bowel gas pattern is normal. There is no evidence of free air. No evidence of organomegaly radiographically. No radio-opaque calculi or other significant radiographic abnormality is seen. IMPRESSION: Negative. Electronically Signed   By: Ted Mcalpine M.D.   On: 08/27/2015 10:50      Medications:   Impression:  Principal Problem:   Cirrhosis (HCC) Active Problems:   HTN (hypertension)   Alcohol withdrawal (HCC)   ETOH abuse     Plan: Ativan protocol for ethanol withdrawal monitor electrolytes and magnesium level Percocet 5 mg every 4 hours when necessary for pain intravenous fluid resuscitation  Consultants: Orthopedics when more clinically stable for ethanol withdrawal     Procedures   Antibiotics:                   Code Status:   Family Communication:    Disposition Plan   Time spent: 30 minutes     Murle Hellstrom M   08/28/2015, 12:49 PM

## 2015-08-28 NOTE — Progress Notes (Signed)
Patient continues to be agitated/restless and his heart rate is still in the upper 130s. Patient's CIWA score was 17 before the previous dose of ativan at 2150. One hour later, CIWA score is now 16. Paged midlevel. Order received for  of iv ativan. Will give and continue to monitor closely.

## 2015-08-28 NOTE — Progress Notes (Signed)
Patient got out of bed and made it into the hallway. Although patient answers questions correctly, he is still showing some confusion. When he was being assisted back to his room, he states "I busted my butt earlier". I asked the patient if he fell when he got out of bed. He says "no". When he was questioned further about this, he states that he meant that he fell earlier at home. He insists that he did not fall while in the hospital. Patient escorted back to bed, bed alarm turned on, call bell within reach.

## 2015-08-29 LAB — CBC WITH DIFFERENTIAL/PLATELET
BASOS ABS: 0 10*3/uL (ref 0.0–0.1)
Basophils Relative: 0 %
EOS PCT: 1 %
Eosinophils Absolute: 0.1 10*3/uL (ref 0.0–0.7)
HEMATOCRIT: 30.1 % — AB (ref 39.0–52.0)
Hemoglobin: 10.1 g/dL — ABNORMAL LOW (ref 13.0–17.0)
LYMPHS ABS: 1.2 10*3/uL (ref 0.7–4.0)
LYMPHS PCT: 13 %
MCH: 32.3 pg (ref 26.0–34.0)
MCHC: 33.6 g/dL (ref 30.0–36.0)
MCV: 96.2 fL (ref 78.0–100.0)
MONO ABS: 0.8 10*3/uL (ref 0.1–1.0)
MONOS PCT: 8 %
Neutro Abs: 7.8 10*3/uL — ABNORMAL HIGH (ref 1.7–7.7)
Neutrophils Relative %: 79 %
PLATELETS: 42 10*3/uL — AB (ref 150–400)
RBC: 3.13 MIL/uL — ABNORMAL LOW (ref 4.22–5.81)
RDW: 15.5 % (ref 11.5–15.5)
WBC: 10 10*3/uL (ref 4.0–10.5)

## 2015-08-29 LAB — BASIC METABOLIC PANEL
Anion gap: 7 (ref 5–15)
BUN: 28 mg/dL — AB (ref 6–20)
CHLORIDE: 103 mmol/L (ref 101–111)
CO2: 24 mmol/L (ref 22–32)
CREATININE: 1.14 mg/dL (ref 0.61–1.24)
Calcium: 7.8 mg/dL — ABNORMAL LOW (ref 8.9–10.3)
GFR calc Af Amer: >60 mL/min (ref >60)
GFR calc non Af Amer: >60 mL/min (ref >60)
GLUCOSE: 95 mg/dL (ref 65–99)
Potassium: 3.7 mmol/L (ref 3.5–5.1)
SODIUM: 134 mmol/L — AB (ref 135–145)

## 2015-08-29 MED ORDER — VANCOMYCIN HCL 10 G IV SOLR
1500.0000 mg | Freq: Once | INTRAVENOUS | Status: AC
  Administered 2015-08-29: 1500 mg via INTRAVENOUS
  Filled 2015-08-29: qty 1500

## 2015-08-29 MED ORDER — VANCOMYCIN HCL IN DEXTROSE 1-5 GM/200ML-% IV SOLN
1000.0000 mg | Freq: Two times a day (BID) | INTRAVENOUS | Status: DC
  Administered 2015-08-29 – 2015-08-31 (×5): 1000 mg via INTRAVENOUS
  Filled 2015-08-29 (×5): qty 200

## 2015-08-29 MED ORDER — PIPERACILLIN-TAZOBACTAM 3.375 G IVPB
3.3750 g | Freq: Three times a day (TID) | INTRAVENOUS | Status: DC
  Administered 2015-08-29 – 2015-09-01 (×9): 3.375 g via INTRAVENOUS
  Filled 2015-08-29 (×9): qty 50

## 2015-08-29 NOTE — Progress Notes (Signed)
Patient appears less tremulous today than yesterday from ethanol withdrawal patient spiked a temperature blood cultures grew out gram-positive cocci in clusters knee does have mild rubor today with continued swelling could be a septic arthritis patient placed on vancomycin and Zosyn WBCs came down from 18-13,000 with repeat CBC today obtain orthopedic consult concerning possible right knee septic arthritis Roberto [name] WUJ:[id number] DOB: Dec 05, 1945 DOA: 08/27/2015 PCP: Isabella Stalling, MD             Physical Exam: Blood pressure 111/73, pulse 101, temperature 99 F (37.2 C), temperature source Oral, resp. rate 20, height 6' (1.829 m), weight 212 lb (96.163 kg), SpO2 95 %. lungs show prolonged respiratory phase scattered rhonchi no rales no wheezes appreciable heart regular rhythm no S3-S4 no heaves thrills rubs abdomen soft nontender bowel sounds normoactive knee does have some rubor today somewhat worse than yesterday continued swelling possible septic knee   Investigations:  Recent Results (from the past 240 hour(s))  Culture, blood (single)     Status: None (Preliminary result)   Collection Time: 08/28/15  5:30 PM  Result Value Ref Range Status   Specimen Description BLOOD RIGHT HAND  Final   Special Requests BOTTLES DRAWN AEROBIC AND ANAEROBIC 5CC EACH  Final   Culture  Setup Time   Final    GRAM POSITIVE COCCI IN CLUSTERS Gram Stain Report Called to,Read Back By and Verified With: IN BOTH ANAROBIC AND AEROBIC BOTTLES CALLED TO DICKERSON,M AT 0848 ON 08/29/2015 BY WOODS,M    Culture PENDING  Incomplete   Report Status PENDING  Incomplete     Basic Metabolic Panel:  Recent Labs  95/62/13 0603 08/29/15 0446  NA 133* 134*  K 4.4 3.7  CL 104 103  CO2 21* 24  GLUCOSE 114* 95  BUN 22* 28*  CREATININE 1.52* 1.14  CALCIUM 7.8* 7.8*  MG 1.7  --    Liver Function Tests:  Recent Labs  08/27/15 0752 08/28/15 0603  AST 86* 103*  ALT 27 30  ALKPHOS 114 92   BILITOT 0.9 1.0  PROT 7.1 6.4*  ALBUMIN 2.5* 2.2*     CBC:  Recent Labs  08/27/15 0752 08/28/15 0603 08/28/15 1730  WBC 9.5 18.0* 13.3*  NEUTROABS 8.5*  --   --   HGB 10.9* 9.8* 10.4*  HCT 32.1* 29.0* 30.8*  MCV 96.1 97.0 96.3  PLT 76* 68* 56*    Dg Chest Port 1 View  08/28/2015  CLINICAL DATA:  Fever. Hypertension, COPD, substance abuse. Alcohol dependence. EXAM: PORTABLE CHEST 1 VIEW COMPARISON:  08/27/2015 FINDINGS: Platelike atelectasis in the lingula. Airspace disease in the right lung base and patchy opacity in the right upper lobe concerning for pneumonia. Heart is borderline in size. No effusions. No acute bony abnormality. IMPRESSION: Patchy right lower lobe and right upper lobe airspace disease concerning for pneumonia. Lingular atelectasis. Borderline heart size. Electronically Signed   By: Charlett Nose M.D.   On: 08/28/2015 18:05   Dg Knee Complete 4 Views Right  08/27/2015  CLINICAL DATA:  Recent fall with a right knee injury. Pain. Initial encounter. EXAM: RIGHT KNEE - COMPLETE 4+ VIEW COMPARISON:  None. FINDINGS: No acute bony or joint abnormality is identified. A loose body projects over the posterior aspect of the lateral compartment and measures 1.2 cm in diameter. Small joint effusion is noted. Atherosclerosis is identified. IMPRESSION: No acute abnormality. Loose body posterior aspect of the lateral compartment. Small joint effusion. Electronically Signed   By: Drusilla Kanner  M.D.   On: 08/27/2015 14:00      Medications:   Impression:  Principal Problem:   Cirrhosis (HCC) Active Problems:   HTN (hypertension)   Alcohol withdrawal (HCC)   ETOH abuse     Plan:   Continue Vanco and Zosyn CBC today monitor for septicemia orthopedic consult today continue Ativan per protocol  Consultants: Orthopedics requested   Procedures   Antibiotics: Vancomycin and Zosyn                  Code Status: Full  Family Communication:  Patient at  length  Disposition Plan   Time spent: 30 minutes   LOS: 1 day   Avalon Coppinger M   08/29/2015, 11:59 AM

## 2015-08-29 NOTE — Progress Notes (Signed)
ANTIBIOTIC CONSULT NOTE - INITIAL  Pharmacy Consult for Vancomycin and Zosyn Indication: bacteremia  No Known Allergies  Patient Measurements: Height: 6' (182.9 cm) Weight: 212 lb (96.163 kg) IBW/kg (Calculated) : 77.6  Vital Signs: Temp: 99 F (37.2 C) (01/29 0200) Temp Source: Oral (01/29 0200) BP: 111/73 mmHg (01/29 0200) Pulse Rate: 101 (01/29 0200) Intake/Output from previous day: 01/28 0701 - 01/29 0700 In: 720 [P.O.:720] Out: 800 [Urine:800] Intake/Output from this shift:    Labs:  Recent Labs  08/27/15 0752 08/28/15 0603 08/28/15 1730 08/29/15 0446  WBC 9.5 18.0* 13.3*  --   HGB 10.9* 9.8* 10.4*  --   PLT 76* 68* 56*  --   CREATININE 1.02 1.52*  --  1.14   Estimated Creatinine Clearance: 75.6 mL/min (by C-G formula based on Cr of 1.14). No results for input(s): VANCOTROUGH, VANCOPEAK, VANCORANDOM, GENTTROUGH, GENTPEAK, GENTRANDOM, TOBRATROUGH, TOBRAPEAK, TOBRARND, AMIKACINPEAK, AMIKACINTROU, AMIKACIN in the last 72 hours.   Microbiology: Recent Results (from the past 720 hour(s))  Culture, blood (single)     Status: None (Preliminary result)   Collection Time: 08/28/15  5:30 PM  Result Value Ref Range Status   Specimen Description BLOOD RIGHT HAND  Final   Special Requests BOTTLES DRAWN AEROBIC AND ANAEROBIC 5CC EACH  Final   Culture  Setup Time   Final    GRAM POSITIVE COCCI IN CLUSTERS Gram Stain Report Called to,Read Back By and Verified With: IN BOTH ANAROBIC AND AEROBIC BOTTLES CALLED TO DICKERSON,M AT 0848 ON 08/29/2015 BY WOODS,M    Culture PENDING  Incomplete   Report Status PENDING  Incomplete   Medical History: Past Medical History  Diagnosis Date  . COPD (chronic obstructive pulmonary disease) ([place])   . Hypertension   . Hepatic steatosis 12/14/2011  . Compression fracture of L1 lumbar vertebra ([place]) 12/14/2011  . C2 cervical fracture ([place])   . Duodenal ulcer with hemorrhage 12/15/2011    Likely source of bleeding.per EGD; Dr. Laural Golden  .  Esophageal varices without bleeding ([place]) 12/15/2011    per EGD  . Cirrhosis ([place]) 12/16/2011    per ultrasound  . Hx of substance abuse 12/17/2011  . Alcohol dependence with alcohol-induced persisting dementia ([place]) 12/18/2011  . Hepatic encephalopathy ([place])   . Helicobacter pylori antibody positive 12/19/2011  . Encephalopathy 05/18/2012  . UTI (urinary tract infection) 12/13/2011  . Ascites 05/18/2012    S/p paracentesis yielding 7880 mL  . Cirrhosis ([place])   . Depression    Anti-infectives    Start     Dose/Rate Route Frequency Ordered Stop   08/29/15 1000  piperacillin-tazobactam (ZOSYN) IVPB 3.375 g     3.375 g 12.5 mL/hr over 240 Minutes Intravenous Every 8 hours 08/29/15 0934     08/29/15 1000  vancomycin (VANCOCIN) 1,500 mg in sodium chloride 0.9 % 500 mL IVPB     1,500 mg 250 mL/hr over 120 Minutes Intravenous  Once 08/29/15 0936     08/28/15 1845  cefTRIAXone (ROCEPHIN) 1 g in dextrose 5 % 50 mL IVPB  Status:  Discontinued     1 g 100 mL/hr over 30 Minutes Intravenous Every 24 hours 08/28/15 1835 08/29/15 0927   08/28/15 1845  azithromycin (ZITHROMAX) 500 mg in dextrose 5 % 250 mL IVPB  Status:  Discontinued     500 mg 250 mL/hr over 60 Minutes Intravenous Every 24 hours 08/28/15 1835 08/29/15 4854     Assessment: 70 year old man with known alcoholic cirrhosis who is noncompliant with medical treatment who  presents to the hospital today with a myriad of complaints as stated above. He states 2 weeks ago he fell in the shower and suffered a laceration on his left knee after which his knee has become quite swollen. He states that 3 days ago he went on a "drunk binge" during which he has taken anywhere from 12-18 beers a day. Blood cx with [facility], asked to start Vanc and Zosyn  Goal of Therapy:  Vancomycin trough level 15-20 mcg/ml  Plan:  Vancomycin '1500mg'$  IV now x 1 then '1000mg'$  IV q12hrs Zosyn 3.375gm IV q8h, EID Check Vancomycin trough level at steady state Monitor labs,  renal fxn, c/s - narrow ABX when sensitivities known  [place] Crane, Donnel Venuto A 08/29/2015,11:18 AM

## 2015-08-30 DIAGNOSIS — M25461 Effusion, right knee: Secondary | ICD-10-CM

## 2015-08-30 LAB — BASIC METABOLIC PANEL
Anion gap: 7 (ref 5–15)
BUN: 30 mg/dL — AB (ref 6–20)
CHLORIDE: 103 mmol/L (ref 101–111)
CO2: 24 mmol/L (ref 22–32)
Calcium: 7.6 mg/dL — ABNORMAL LOW (ref 8.9–10.3)
Creatinine, Ser: 1.21 mg/dL (ref 0.61–1.24)
GFR calc Af Amer: >60 mL/min (ref >60)
GFR calc non Af Amer: >60 mL/min (ref >60)
GLUCOSE: 112 mg/dL — AB (ref 65–99)
POTASSIUM: 3.6 mmol/L (ref 3.5–5.1)
Sodium: 134 mmol/L — ABNORMAL LOW (ref 135–145)

## 2015-08-30 NOTE — Consult Note (Signed)
CHAMP autoconsult for Staph aureus bacteremia:  Brief history: 70 yo male with alcoholic cirrhosis, substance abuse with a recent fall and multiple complaints and now 1/1 blood culture with Staph aureus.  Possible source of knee.  No report of new back pain.    Recommend:  1) repeat blood cultures to assure clearance 2) TTE and TEE if negative TTE 3) 2 weeks of IV antibiotics if TEE negative for vegetation (4 weeks if septic arthritis thought to be present) 4) can narrow antibiotics as appropriate  Danon Lograsso

## 2015-08-30 NOTE — Care Management Note (Signed)
Case Management Note  Patient Details  Name: Roberto [name] MRN: [id number] Date of Birth: 23-Jan-1946  Subjective/Objective:                  Admitted with cirrhosis, pt is from home, lives alone and is ind at baseline. Pt has brother who provides support as needed. Pt has neb, cane, walker, WC prior to admission. Pt is not active with [facility] services prior to admission. Pt does not have home O2 prior to admission. Pt plans to return home with self care. Pt awaiting ortho consult for knee pain. Anticipate pt will need HH at DC.  Action/Plan: Will cont to follow for DC planning.   Expected Discharge Date:    09/01/2015              Expected Discharge Plan:  Home w Home Health Services  In-House Referral:  NA  Discharge planning Services  CM Consult  Post Acute Care Choice:    Choice offered to:     DME Arranged:    DME Agency:     HH Arranged:    HH Agency:     Status of Service:  In process, will continue to follow  Medicare Important Message Given:    Date Medicare IM Given:    Medicare IM give by:    Date Additional Medicare IM Given:    Additional Medicare Important Message give by:     If discussed at Long Length of Stay Meetings, dates discussed:    Additional Comments:  Malcolm Metro, RN 08/30/2015, 12:53 PM

## 2015-08-30 NOTE — Consult Note (Addendum)
Patient ID: Roberto [name], male   DOB: 1945/08/12, 70 y.o.   MRN: [id number]  CONSULT [id number] Detailed history Detailed exam Low complexity  Chief Complaint  Patient presents with  .  right knee pain and swelling     HPI Roberto [name] is a 70 y.o. male.  Presents after right knee injury with laceration and swelling. He has positive blood cultures. Consult requested to evaluate possible septic arthritis. He indicates he fell in his bathtub laceration sustained over the right knee presented to the hospital with shortness of breath and possible sepsis.  Right knee  Duration less than 1 week  Severity mild  Quality dull  Modified by motion Location   Review of Systems (at least 2) Review of Systems  Respiratory: Positive for wheezing.    1. Fever chills 2. Swelling right knee, neurovascular complaints none  Past Medical History  Diagnosis Date  . COPD (chronic obstructive pulmonary disease) ([facility])   . Hypertension   . Hepatic steatosis 12/14/2011  . Compression fracture of L1 lumbar vertebra ([facility]) 12/14/2011  . C2 cervical fracture ([facility])   . Duodenal ulcer with hemorrhage 12/15/2011    Likely source of bleeding.per EGD; Dr. Karilyn Cota  . Esophageal varices without bleeding ([facility]) 12/15/2011    per EGD  . Cirrhosis ([facility]) 12/16/2011    per ultrasound  . Hx of substance abuse 12/17/2011  . 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 Hepatic encephalopathy ([facility])   . Helicobacter pylori antibody positive 12/19/2011  . Encephalopathy 05/18/2012  . UTI (urinary tract infection) 12/13/2011  . Ascites 05/18/2012    S/p paracentesis yielding 7880 mL  . Cirrhosis ([facility])   . Depression     Past Surgical History  Procedure Laterality Date  . Coronary angioplasty with stent placement      Social History  Social History  Substance Use Topics  . Smoking status: Current Every Day Smoker -- 1.00 packs/day    Types: Cigarettes  . Smokeless tobacco:  Current User     Comment: 1 1/2 pack a day  . Alcohol Use: Yes     Comment: drinking everyday.    No Known Allergies  Current Facility-Administered Medications  Medication Dose Route Frequency Provider Last Rate Last Dose  . 0.9 %  sodium chloride infusion  250 mL Intravenous PRN Henderson Cloud, MD      . acetaminophen (TYLENOL) tablet 650 mg  650 mg Oral Q6H PRN Avon Gully, MD   650 mg at 08/28/15 1742  . albuterol (PROVENTIL) (2.5 MG/3ML) 0.083% nebulizer solution 2.5 mg  2.5 mg Nebulization Q6H PRN Henderson Cloud, MD   2.5 mg at 08/30/15 1007  . folic acid (FOLVITE) tablet 1 mg  1 mg Oral Daily Estela Isaiah Blakes, MD   1 mg at 08/29/15 1914  . furosemide (LASIX) tablet 20 mg  20 mg Oral Daily Henderson Cloud, MD   20 mg at 08/29/15 7829  . lactulose (CHRONULAC) 10 GM/15ML solution 10 g  10 g Oral TID Henderson Cloud, MD   10 g at 08/29/15 2115  . LORazepam (ATIVAN) injection 0-4 mg  0-4 mg Intravenous Q12H Henderson Cloud, MD   0 mg at 08/29/15 1715  . LORazepam (ATIVAN) tablet 1 mg  1 mg Oral Q6H PRN Henderson Cloud, MD   1 mg at 08/30/15 0429   Or  . LORazepam (ATIVAN) injection 1 mg  1 mg Intravenous  Q6H PRN Henderson Cloud, MD   1 mg at 08/28/15 1324  . multivitamin with minerals tablet 1 tablet  1 tablet Oral Daily Henderson Cloud, MD   1 tablet at 08/29/15 [phone]  . ondansetron (ZOFRAN) tablet 4 mg  4 mg Oral Q6H PRN Henderson Cloud, MD       Or  . ondansetron [facility]) injection 4 mg  4 mg Intravenous Q6H PRN Henderson Cloud, MD      . oxyCODONE (Oxy IR/ROXICODONE) immediate release tablet 5 mg  5 mg Oral Q4H PRN Henderson Cloud, MD   5 mg at 08/30/15 0429  . pantoprazole (PROTONIX) EC tablet 40 mg  40 mg Oral Daily Henderson Cloud, MD   40 mg at 08/29/15 1027  . piperacillin-tazobactam (ZOSYN) IVPB 3.375 g  3.375 g Intravenous Q8H Oval Linsey, MD    3.375 g at 08/30/15 0958  . polyvinyl alcohol (LIQUIFILM TEARS) 1.4 % ophthalmic solution 1 drop  1 drop Both Eyes PRN Oval Linsey, MD      . senna-docusate (Senokot-S) tablet 1 tablet  1 tablet Oral QHS PRN Henderson Cloud, MD      . sodium chloride flush (NS) 0.9 % injection 3 mL  3 mL Intravenous Q12H Estela Isaiah Blakes, MD   3 mL at 08/29/15 0953  . sodium chloride flush (NS) 0.9 % injection 3 mL  3 mL Intravenous Q12H Henderson Cloud, MD   3 mL at 08/28/15 2153  . sodium chloride flush (NS) 0.9 % injection 3 mL  3 mL Intravenous PRN Henderson Cloud, MD      . spironolactone (ALDACTONE) tablet 25 mg  25 mg Oral Daily Henderson Cloud, MD   25 mg at 08/29/15 2536  . thiamine (VITAMIN B-1) tablet 100 mg  100 mg Oral Daily Henderson Cloud, MD   100 mg at 08/29/15 6440   Or  . thiamine (B-1) injection 100 mg  100 mg Intravenous Daily Henderson Cloud, MD      . vancomycin (VANCOCIN) IVPB 1000 mg/200 mL premix  1,000 mg Intravenous Q12H Oval Linsey, MD   1,000 mg at 08/29/15 2116      Physical Exam-Detailed Physical Exam  Blood pressure 100/59, pulse 99, temperature 99.9 F (37.7 C), temperature source Oral, resp. rate 20, height 6' (1.829 m), weight 212 lb (96.163 kg), SpO2 95 %. Gen. appearance normal  Cardiovascular exam the pulses are 2+ with no peripheral edema  The ambulatory status is bed rest   Extremity examined right lower  Inspection Range of motion assessmeknee found in flexion a prostate 20 I was able to extend to 10 and flex it to 95 with mild discomfort no rigidity  stability assessment stability test reveal no instability or laxity Muscle strength and muscle tone are normal with no atrophy or tremors Skin  laceration obliquely over the anteromedial knee healed normally without erythema  Sensation to touch is normal The patient is oriented to person place and time The patient's mood and  affect show no depression or anxiety or agitation  For comparison the opposite extremity was examined and the following findings were nNormal range of motion stability and strength of the left knee  AL DECISION MAKING (minimum/low)  Data Reviewed  I have personally reviewed the imaging studies and the report and my interpretation  Films show osteoarthritis with effusion  Assessment  effusion right knee without signs of sepsis     Plan    aspiration right knee       Fuller Canada 08/30/2015, 11:00 AM

## 2015-08-30 NOTE — Progress Notes (Signed)
Recent and right knee aspirated from 1 hour ago lab Vanco and Zosyn with gram-positive cocci some blood on chest x-ray reveals a questionable right upper lobe and right middle lobe infiltrate or distress. He is has some altered mental status for ethanol withdrawal and Ativan protocol RYDER MAN [id number] DOB: 26-Jul-1946 DOA: 08/27/2015 PCP: Isabella Stalling, MD             Physical Exam: Blood pressure 100/59, pulse 99, temperature 99.9 F (37.7 C), temperature source Oral, resp. rate 20, height 6' (1.829 m), weight 212 lb (96.163 kg), SpO2 95 %. lungs show scattered rhonchi no rales no wheezes appreciable heart regular rhythm no S3-S4 no heaves thrills or rubs   Investigations:  Recent Results (from the past 240 hour(s))  Culture, blood (single)     Status: None (Preliminary result)   Collection Time: 08/28/15  5:30 PM  Result Value Ref Range Status   Specimen Description BLOOD RIGHT HAND  Final   Special Requests BOTTLES DRAWN AEROBIC AND ANAEROBIC 5CC EACH  Final   Culture  Setup Time   Final    GRAM POSITIVE COCCI IN CLUSTERS IN BOTH AEROBIC AND ANAEROBIC BOTTLES CRITICAL RESULT CALLED TO, READ BACK BY AND VERIFIED WITH: DICKERSON,M AT 9562 ON 08/29/2015 BY WOODS,M    Culture   Final    STAPHYLOCOCCUS AUREUS Performed at [facility]    Report Status PENDING  Incomplete     Basic Metabolic Panel:  Recent Labs  13/08/65 0603 08/29/15 0446 08/30/15 0527  NA 133* 134* 134*  K 4.4 3.7 3.6  CL 104 103 103  CO2 21* 24 24  GLUCOSE 114* 95 112*  BUN 22* 28* 30*  CREATININE 1.52* 1.14 1.21  CALCIUM 7.8* 7.8* 7.6*  MG 1.7  --   --    Liver Function Tests:  Recent Labs  08/28/15 0603  AST 103*  ALT 30  ALKPHOS 92  BILITOT 1.0  PROT 6.4*  ALBUMIN 2.2*     CBC:  Recent Labs  08/28/15 1730 08/29/15 1316  WBC 13.3* 10.0  NEUTROABS  --  7.8*  HGB 10.4* 10.1*  HCT 30.8* 30.1*  MCV 96.3 96.2  PLT 56* 42*    Dg Chest Port 1  View  08/28/2015  CLINICAL DATA:  Fever. Hypertension, COPD, substance abuse. Alcohol dependence. EXAM: PORTABLE CHEST 1 VIEW COMPARISON:  08/27/2015 FINDINGS: Platelike atelectasis in the lingula. Airspace disease in the right lung base and patchy opacity in the right upper lobe concerning for pneumonia. Heart is borderline in size. No effusions. No acute bony abnormality. IMPRESSION: Patchy right lower lobe and right upper lobe airspace disease concerning for pneumonia. Lingular atelectasis. Borderline heart size. Electronically Signed   By: Charlett Nose M.D.   On: 08/28/2015 18:05      Medications:   Impression:  Principal Problem:   Cirrhosis (HCC) Active Problems:   HTN (hypertension)   Alcohol withdrawal (HCC)   ETOH abuse     Plan: Continue vancomycin and Zosyn  await results of aspiration into new Ativan protocol for ethanol withdrawal monitor electrolytes   Consultants: Orthopedics   Procedures   Antibiotics: Vancomycin and Zosyn                  Code Status: Full  Family Communication:  Spoke with patient appears mildly confused at present  Disposition Plan see plan above  Time spent: 30 minutes   LOS: 2 days   Elianys Conry M   08/30/2015, 11:55 AM

## 2015-08-31 LAB — CBC
HCT: 28.7 % — ABNORMAL LOW (ref 39.0–52.0)
HEMOGLOBIN: 9.8 g/dL — AB (ref 13.0–17.0)
MCH: 32.1 pg (ref 26.0–34.0)
MCHC: 34.1 g/dL (ref 30.0–36.0)
MCV: 94.1 fL (ref 78.0–100.0)
PLATELETS: 51 10*3/uL — AB (ref 150–400)
RBC: 3.05 MIL/uL — ABNORMAL LOW (ref 4.22–5.81)
RDW: 15.7 % — AB (ref 11.5–15.5)
WBC: 15.4 10*3/uL — ABNORMAL HIGH (ref 4.0–10.5)

## 2015-08-31 LAB — COMPREHENSIVE METABOLIC PANEL
ALBUMIN: 1.9 g/dL — AB (ref 3.5–5.0)
ALT: 44 U/L (ref 17–63)
ANION GAP: 7 (ref 5–15)
AST: 162 U/L — ABNORMAL HIGH (ref 15–41)
Alkaline Phosphatase: 106 U/L (ref 38–126)
BUN: 33 mg/dL — ABNORMAL HIGH (ref 6–20)
CHLORIDE: 102 mmol/L (ref 101–111)
CO2: 25 mmol/L (ref 22–32)
Calcium: 7.4 mg/dL — ABNORMAL LOW (ref 8.9–10.3)
Creatinine, Ser: 1.24 mg/dL (ref 0.61–1.24)
GFR calc Af Amer: >60 mL/min (ref >60)
GFR calc non Af Amer: 58 mL/min — ABNORMAL LOW (ref >60)
GLUCOSE: 133 mg/dL — AB (ref 65–99)
POTASSIUM: 3.4 mmol/L — AB (ref 3.5–5.1)
SODIUM: 134 mmol/L — AB (ref 135–145)
Total Bilirubin: 3.2 mg/dL — ABNORMAL HIGH (ref 0.3–1.2)
Total Protein: 6.2 g/dL — ABNORMAL LOW (ref 6.5–8.1)

## 2015-08-31 LAB — CULTURE, BLOOD (SINGLE)

## 2015-08-31 LAB — BASIC METABOLIC PANEL
ANION GAP: 8 (ref 5–15)
BUN: 33 mg/dL — ABNORMAL HIGH (ref 6–20)
CHLORIDE: 103 mmol/L (ref 101–111)
CO2: 24 mmol/L (ref 22–32)
CREATININE: 1.31 mg/dL — AB (ref 0.61–1.24)
Calcium: 7.4 mg/dL — ABNORMAL LOW (ref 8.9–10.3)
GFR calc non Af Amer: 55 mL/min — ABNORMAL LOW (ref >60)
Glucose, Bld: 93 mg/dL (ref 65–99)
Potassium: 3.4 mmol/L — ABNORMAL LOW (ref 3.5–5.1)
Sodium: 135 mmol/L (ref 135–145)

## 2015-08-31 LAB — LACTIC ACID, PLASMA: Lactic Acid, Venous: 1.5 mmol/L (ref 0.5–2.0)

## 2015-08-31 MED ORDER — OXYCODONE HCL 5 MG PO TABS
10.0000 mg | ORAL_TABLET | ORAL | Status: DC | PRN
  Administered 2015-08-31 – 2015-09-01 (×4): 10 mg via ORAL
  Filled 2015-08-31 (×4): qty 2

## 2015-08-31 MED ORDER — LORAZEPAM 0.5 MG PO TABS
0.5000 mg | ORAL_TABLET | Freq: Four times a day (QID) | ORAL | Status: DC | PRN
  Administered 2015-08-31 – 2015-09-06 (×5): 0.5 mg via ORAL
  Filled 2015-08-31 (×5): qty 1

## 2015-08-31 NOTE — Progress Notes (Signed)
Pt has mult reddened areas over abdomin legs arms.  Dr Janna Arch aware

## 2015-08-31 NOTE — Progress Notes (Signed)
Patient is treated with dual antibiotics will vancomycin and Zosyn for gram-positive cocci in blood cultures with presumed septic arthritic knee he likewise has infiltrates on chest x-ray to right upper lobe and right lower lobe presumably treated with the above-mentioned antibiotics less tremulous and somewhat lethargic Ativan protocols being weaned he has significant pain and right leg right knee his oxycodone all be increased from 5-10 mg every 4 hours when necessary for pain as his lorazepam is diminished awaiting results of the aspiration from lab TEAGUE GOYNES [id number] DOB: 1946-05-31 DOA: 08/27/2015 PCP: Maricela Curet, MD             Physical Exam: Blood pressure 124/87, pulse 64, temperature 100.1 F (37.8 C), temperature source Oral, resp. rate 20, height 6' (1.829 m), weight 212 lb (96.163 kg), SpO2 96 %. lungs show scattered rhonchi no rales no wheezes appreciable heart regular rhythm no S3-S4 no heaves thrills rubs abdomen soft nontender bowel sounds normoactive right knee has rubor which is less than 24 hours ago some limited mobility   Investigations:  Recent Results (from the past 240 hour(s))  Culture, blood (single)     Status: None   Collection Time: 08/28/15  5:30 PM  Result Value Ref Range Status   Specimen Description BLOOD RIGHT HAND  Final   Special Requests BOTTLES DRAWN AEROBIC AND ANAEROBIC 5CC EACH  Final   Culture  Setup Time   Final    GRAM POSITIVE COCCI IN CLUSTERS IN BOTH AEROBIC AND ANAEROBIC BOTTLES CRITICAL RESULT CALLED TO, READ BACK BY AND VERIFIED WITH: DICKERSON,M AT 3235 ON 08/29/2015 BY WOODS,M    Culture   Final    METHICILLIN RESISTANT STAPHYLOCOCCUS AUREUS Performed at [facility]    Report Status 08/31/2015 FINAL  Final   Organism ID, Bacteria METHICILLIN RESISTANT STAPHYLOCOCCUS AUREUS  Final      Susceptibility   Methicillin resistant staphylococcus aureus - MIC*    CIPROFLOXACIN >=8 RESISTANT Resistant    ERYTHROMYCIN 0.5 SENSITIVE Sensitive     GENTAMICIN <=0.5 SENSITIVE Sensitive     OXACILLIN >=4 RESISTANT Resistant     TETRACYCLINE <=1 SENSITIVE Sensitive     VANCOMYCIN 1 SENSITIVE Sensitive     TRIMETH/SULFA >=320 RESISTANT Resistant     CLINDAMYCIN <=0.25 SENSITIVE Sensitive     RIFAMPIN <=0.5 SENSITIVE Sensitive     Inducible Clindamycin NEGATIVE Sensitive     * METHICILLIN RESISTANT STAPHYLOCOCCUS AUREUS     Basic Metabolic Panel:  Recent Labs  08/30/15 0527 08/31/15 0539  NA 134* 135  K 3.6 3.4*  CL 103 103  CO2 24 24  GLUCOSE 112* 93  BUN 30* 33*  CREATININE 1.21 1.31*  CALCIUM 7.6* 7.4*   Liver Function Tests: No results for input(s): AST, ALT, ALKPHOS, BILITOT, PROT, ALBUMIN in the last 72 hours.   CBC:  Recent Labs  08/29/15 1316 08/31/15 1303  WBC 10.0 15.4*  NEUTROABS 7.8*  --   HGB 10.1* 9.8*  HCT 30.1* 28.7*  MCV 96.2 94.1  PLT 42* 51*    No results found.    Medications:   Impression:  Principal Problem:   Cirrhosis ([place]) Active Problems:   HTN (hypertension)   Alcohol withdrawal ([place])   ETOH abuse     Plan: Continue vancomycin and Zosyn CBC with differential and be met in a.m. increase oxycodone 5-10 every 4 hours when necessary to new weaning from Ativan  Consultants: Orthopedic surgery    Procedures right knee aspiration   Antibiotics: Vancomycin  and Zosyn                  Code Status: Full  Family Communication:    Disposition Plan  Time spent: 30 minutes   LOS: 3 days   Lamija Besse M   08/31/2015, 1:22 PM

## 2015-09-01 ENCOUNTER — Inpatient Hospital Stay (HOSPITAL_COMMUNITY): Payer: Medicare Other

## 2015-09-01 DIAGNOSIS — R140 Abdominal distension (gaseous): Secondary | ICD-10-CM

## 2015-09-01 DIAGNOSIS — N289 Disorder of kidney and ureter, unspecified: Secondary | ICD-10-CM

## 2015-09-01 LAB — AMMONIA: AMMONIA: 44 umol/L — AB (ref 9–35)

## 2015-09-01 LAB — BASIC METABOLIC PANEL
ANION GAP: 7 (ref 5–15)
BUN: 45 mg/dL — AB (ref 6–20)
CO2: 25 mmol/L (ref 22–32)
Calcium: 7.3 mg/dL — ABNORMAL LOW (ref 8.9–10.3)
Chloride: 102 mmol/L (ref 101–111)
Creatinine, Ser: 1.86 mg/dL — ABNORMAL HIGH (ref 0.61–1.24)
GFR, EST AFRICAN AMERICAN: 42 mL/min — AB (ref >60)
GFR, EST NON AFRICAN AMERICAN: 36 mL/min — AB (ref >60)
Glucose, Bld: 92 mg/dL (ref 65–99)
POTASSIUM: 3.4 mmol/L — AB (ref 3.5–5.1)
SODIUM: 134 mmol/L — AB (ref 135–145)

## 2015-09-01 LAB — CBC WITH DIFFERENTIAL/PLATELET
BASOS PCT: 1 %
Basophils Absolute: 0.1 10*3/uL (ref 0.0–0.1)
EOS ABS: 0.7 10*3/uL (ref 0.0–0.7)
Eosinophils Relative: 5 %
HCT: 27.8 % — ABNORMAL LOW (ref 39.0–52.0)
Hemoglobin: 9.6 g/dL — ABNORMAL LOW (ref 13.0–17.0)
LYMPHS ABS: 2.4 10*3/uL (ref 0.7–4.0)
Lymphocytes Relative: 18 %
MCH: 32.4 pg (ref 26.0–34.0)
MCHC: 34.5 g/dL (ref 30.0–36.0)
MCV: 93.9 fL (ref 78.0–100.0)
MONO ABS: 1.7 10*3/uL — AB (ref 0.1–1.0)
MONOS PCT: 13 %
Neutro Abs: 8.4 10*3/uL — ABNORMAL HIGH (ref 1.7–7.7)
Neutrophils Relative %: 63 %
Platelets: 64 10*3/uL — ABNORMAL LOW (ref 150–400)
RBC: 2.96 MIL/uL — ABNORMAL LOW (ref 4.22–5.81)
RDW: 15.8 % — AB (ref 11.5–15.5)
WBC: 13.3 10*3/uL — ABNORMAL HIGH (ref 4.0–10.5)

## 2015-09-01 LAB — VANCOMYCIN, TROUGH: VANCOMYCIN TR: 26 ug/mL — AB (ref 10.0–20.0)

## 2015-09-01 LAB — LACTIC ACID, PLASMA: Lactic Acid, Venous: 1.5 mmol/L (ref 0.5–2.0)

## 2015-09-01 MED ORDER — LORAZEPAM 2 MG/ML IJ SOLN
0.5000 mg | Freq: Once | INTRAMUSCULAR | Status: AC
  Administered 2015-09-01: 0.5 mg via INTRAVENOUS
  Filled 2015-09-01: qty 1

## 2015-09-01 MED ORDER — DIATRIZOATE MEGLUMINE & SODIUM 66-10 % PO SOLN
ORAL | Status: AC
  Filled 2015-09-01: qty 30

## 2015-09-01 MED ORDER — OXYCODONE HCL 5 MG PO TABS
5.0000 mg | ORAL_TABLET | ORAL | Status: DC | PRN
  Administered 2015-09-01 – 2015-09-07 (×14): 5 mg via ORAL
  Filled 2015-09-01 (×14): qty 1

## 2015-09-01 MED ORDER — RIFAXIMIN 550 MG PO TABS
550.0000 mg | ORAL_TABLET | Freq: Two times a day (BID) | ORAL | Status: DC
  Administered 2015-09-01 – 2015-09-07 (×12): 550 mg via ORAL
  Filled 2015-09-01 (×14): qty 1

## 2015-09-01 MED ORDER — VANCOMYCIN HCL 10 G IV SOLR
1250.0000 mg | INTRAVENOUS | Status: DC
  Administered 2015-09-01: 1250 mg via INTRAVENOUS
  Filled 2015-09-01 (×2): qty 1250

## 2015-09-01 MED ORDER — KCL IN DEXTROSE-NACL 20-5-0.45 MEQ/L-%-% IV SOLN
INTRAVENOUS | Status: AC
  Administered 2015-09-01 – 2015-09-02 (×2): via INTRAVENOUS

## 2015-09-01 MED ORDER — POLYETHYLENE GLYCOL 3350 17 G PO PACK
17.0000 g | PACK | Freq: Every day | ORAL | Status: DC
  Administered 2015-09-01 – 2015-09-07 (×6): 17 g via ORAL
  Filled 2015-09-01 (×7): qty 1

## 2015-09-01 NOTE — Progress Notes (Addendum)
Echo tech stated that he was unable to get an accurate picture for the echo.  Echo tech stated that a TEE would be able to give the picture that was needed.  Dr. Janna Arch paged.  Dr. Janna Arch called back and notified of above by RN.

## 2015-09-01 NOTE — Progress Notes (Signed)
      [place] Antimicrobial Management Team Staphylococcus aureus bacteremia   Staphylococcus aureus bacteremia (SAB) is associated with a high rate of complications and mortality.  Specific aspects of clinical management are critical to optimizing the outcome of patients with SAB.  Therefore, the [facility] Antimicrobial Management Team [facility]) has initiated an intervention aimed at improving the management of SAB at [facility].  To do so, Infectious Diseases physicians are providing an evidence-based consult for the management of all patients with SAB.     Yes No Comments  Perform follow-up blood cultures (even if the patient is afebrile) to ensure clearance of bacteremia   BLOOD CULTURES DRAWN TODAY  Remove vascular catheter and obtain follow-up blood cultures after the removal of the catheter   DO NOT PLACE PICC OR CENTRAL LINE UNTIL WE HAVE CLEARANCE OF BLOOD CULTURES  Perform echocardiography to evaluate for endocarditis (transthoracic ECHO is 40-50% sensitive, TEE is > 90% sensitive)   Please keep in mind, that neither test can definitively EXCLUDE endocarditis, and that should clinical suspicion remain high for endocarditis the patient should then still be treated with an "endocarditis" duration of therapy = 6 weeks  WOULD START WITH TTE,   CONSIDER TEE THOUGHT WITH CIRRHOSIS WOULD WORRY IF PT MIGHT HAVE ESOPHAGEAL VARICES, HAS HE HAD EGD RECENTLY?  Consult electrophysiologist to evaluate implanted cardiac device (pacemaker, ICD)   NOT WITH AICD, PM TO MY KNOWLEDGE  Ensure source control   Have all abscesses been drained effectively? Have deep seeded infections (septic joints or osteomyelitis) had appropriate surgical debridement?  NOT CLEAR WHAT SOURCE IS THE LUNGS OR HER KNEE WHICH WAS THOUGHT TO BE POSSIBLY INFECTED  SHE HAD ASPIRATE OF THE KNEE BUT I DO NOT SEE CELL COUNT AND DIFFERENTIAL OR CULTURE FROM THE KNEE  WOULD GET MRI OF THE KNEE WITH  CONTRAST  ENSURE ORTHOPEDICS FOLLOWING AND ? WHAT HAPPENED TO FLUID THAT WAS ASPIRATED?   Investigate for "metastatic" sites of infection   Does the patient have ANY symptom or physical exam finding that would suggest a deeper infection (back or neck pain that may be suggestive of vertebral osteomyelitis or epidural abscess, muscle pain that could be a symptom of pyomyositis)?  Keep in mind that for deep seeded infections MRI imaging with contrast is preferred rather than other often insensitive tests such as plain x-rays, especially early in a patient's presentation.  sEE ABOVE DISCUSSION MY BIGGEST CONCERN IF FOR HEART VALVES AND POSSIBLY INFECTED KNEE JOINT  DOES PT HAVE LOWER BACK PAIN. He has lumbar spine deformities, could he have diskitis?  Change antibiotic therapy to VANCOMYCIN AND DC ZOSYN   Beta-lactam antibiotics are preferred for MSSA due to higher cure rates.   If on Vancomycin, goal trough should be 15 - 20 mcg/mL  Estimated duration of IV antibiotic therapy:  6 WEEKS   Consult case management for probably prolonged outpatient IV antibiotic therapy   Case discussed with Dr. Delbert Harness

## 2015-09-01 NOTE — Progress Notes (Signed)
1230 - Dr. Janna Arch and notified that patient is c/o lower extremity pain and will wiggle toes but states they "hurt really bad."  Dr. Janna Arch states that he has been complaining of this.  No new orders at this time.

## 2015-09-01 NOTE — Progress Notes (Signed)
Patient has had a bowel movement yesterday as well as this morning according to nurse abdomen appears distended 5 plate of abdomen just performed reveals colonic dilatation we'll now send for CT of abdomen and pelvis for further delineation as well as obtain GI consult as well as switched to clear liquids discussed with ID about staph septicemia we'll obtain imaging of right knee via MRI and 2-D echocardiogram to rule out any possible vegetation may have ensued patient currently controlled on vancomycin without Zosyn as per pharmacy and ID recommendations will also check serum ammonia level Roberto [name] [id number] DOB: 1945-11-20 DOA: 08/27/2015 PCP: Isabella Stalling, MD             Physical Exam: Blood pressure 97/61, pulse 100, temperature 98.6 F (37 C), temperature source Oral, resp. rate 20, height 6' (1.829 m), weight 212 lb (96.163 kg), SpO2 91 %. patient responds to verbal stimuli and tactile cranial nerves grossly intact she will follow 4 extremities lungs show prolonged x-ray phase scattered rhonchi no rales no wheezes appreciable heart regular rhythm no murmurs no heaves thrills or rubs appreciable abdomen distended bowel sounds normoactive no peristaltic rushes no detectable organomegaly questionable fluid wave no guarding or rebound elicited right knee appears to be less swollen less rubor less erythema   Investigations:  Recent Results (from the past 240 hour(s))  Culture, blood (single)     Status: None   Collection Time: 08/28/15  5:30 PM  Result Value Ref Range Status   Specimen Description BLOOD RIGHT HAND  Final   Special Requests BOTTLES DRAWN AEROBIC AND ANAEROBIC 5CC EACH  Final   Culture  Setup Time   Final    GRAM POSITIVE COCCI IN CLUSTERS IN BOTH AEROBIC AND ANAEROBIC BOTTLES CRITICAL RESULT CALLED TO, READ BACK BY AND VERIFIED WITH: DICKERSON,M AT 9604 ON 08/29/2015 BY WOODS,M    Culture   Final    METHICILLIN RESISTANT STAPHYLOCOCCUS  AUREUS Performed at [facility]    Report Status 08/31/2015 FINAL  Final   Organism ID, Bacteria METHICILLIN RESISTANT STAPHYLOCOCCUS AUREUS  Final      Susceptibility   Methicillin resistant staphylococcus aureus - MIC*    CIPROFLOXACIN >=8 RESISTANT Resistant     ERYTHROMYCIN 0.5 SENSITIVE Sensitive     GENTAMICIN <=0.5 SENSITIVE Sensitive     OXACILLIN >=4 RESISTANT Resistant     TETRACYCLINE <=1 SENSITIVE Sensitive     VANCOMYCIN 1 SENSITIVE Sensitive     TRIMETH/SULFA >=320 RESISTANT Resistant     CLINDAMYCIN <=0.25 SENSITIVE Sensitive     RIFAMPIN <=0.5 SENSITIVE Sensitive     Inducible Clindamycin NEGATIVE Sensitive     * METHICILLIN RESISTANT STAPHYLOCOCCUS AUREUS     Basic Metabolic Panel:  Recent Labs  54/09/81 1303 09/01/15 0533  NA 134* 134*  K 3.4* 3.4*  CL 102 102  CO2 25 25  GLUCOSE 133* 92  BUN 33* 45*  CREATININE 1.24 1.86*  CALCIUM 7.4* 7.3*   Liver Function Tests:  Recent Labs  08/31/15 1303  AST 162*  ALT 44  ALKPHOS 106  BILITOT 3.2*  PROT 6.2*  ALBUMIN 1.9*     CBC:  Recent Labs  08/29/15 1316 08/31/15 1303 09/01/15 0533  WBC 10.0 15.4* 13.3*  NEUTROABS 7.8*  --  8.4*  HGB 10.1* 9.8* 9.6*  HCT 30.1* 28.7* 27.8*  MCV 96.2 94.1 93.9  PLT 42* 51* 64*    Dg Abd 1 View  09/01/2015  CLINICAL DATA:  Abdominal distention. History of multiple  medical problems including cirrhosis, GI bleed and renal insufficiency. EXAM: ABDOMEN - 1 VIEW COMPARISON:  Radiographs 08/27/2015.  CT 05/18/2012. FINDINGS: Portions of the abdomen are excluded from this single supine portable radiograph. There is moderate diffuse distention of the colon. There may be mild small bowel distension as well. There is no supine evidence of free intraperitoneal air. No definite signs of recurrent ascites are seen by the current examination. There are no suspicious abdominal calcifications. Compression deformities at T12, L1 and L3 appear grossly unchanged.  IMPRESSION: Diffuse bowel distention, primarily appearing to reflect colon. Evaluation is limited by supine radiography, and distal colonic obstruction cannot be excluded. Consider radiographic follow up or CT for further evaluation. Electronically Signed   By: Carey Bullocks M.D.   On: 09/01/2015 12:10      Medications:   Impression: Staphylococcal septicemia Abdominal distention Principal Problem:   Cirrhosis (HCC) Active Problems:   HTN (hypertension)   Alcohol withdrawal (HCC)   ETOH abuse     Plan: We'll obtain 2-D echocardiogram to rule out any vegetations will get CAT scan of abdomen and pelvis to further the liberty 8 source of colonic distention will place and nothing by mouth obtain MRI of right knee further delineation of possible septic joint as per ID recommendation   Consultants: Infectious disease and gastroenterology requested orthopedic surgery   Procedures status post aspiration of right knee   Antibiotics: IV vancomycin                  Code Status: Full   Family Communication:  Spoke with patient at length  Disposition Plan see plan above  Time spent: 45 minutes   LOS: 4 days   Sante Biedermann M   09/01/2015, 12:34 PM

## 2015-09-01 NOTE — Progress Notes (Signed)
Dr. Janna Arch paged.  Dr. Janna Arch called back and gave order for abdominal xray.

## 2015-09-01 NOTE — Progress Notes (Signed)
Dr. Janna Arch called to check on patient and labs.  Notified that patient was currently in CT and that the tech stated that the patient would not lie still for the MRI.  Dr. Janna Arch gave one time order to Ativan IV, if needed for MRI completion.  Oncoming nurse notified.

## 2015-09-01 NOTE — Progress Notes (Addendum)
Patient's abdomen more distended and complaining of abdominal pain.  Dr. Janna Arch called and left message.

## 2015-09-01 NOTE — Progress Notes (Signed)
ANTIBIOTIC CONSULT NOTE - follow up  Pharmacy Consult for Vancomycin and Zosyn Indication: bacteremia  No Known Allergies  Patient Measurements: Height: 6' (182.9 cm) Weight: 212 lb (96.163 kg) IBW/kg (Calculated) : 77.6  Vital Signs: Temp: 98.6 F (37 C) (02/01 0500) Temp Source: Oral (02/01 0500) BP: 97/61 mmHg (02/01 0500) Pulse Rate: 100 (02/01 0500) Intake/Output from previous day: 01/31 0701 - 02/01 0700 In: 840 [P.O.:840] Out: -  Intake/Output from this shift: Total I/O In: 360 [P.O.:360] Out: -   Labs:  Recent Labs  08/29/15 1316  08/31/15 0539 08/31/15 1303 09/01/15 0533  WBC 10.0  --   --  15.4* 13.3*  HGB 10.1*  --   --  9.8* 9.6*  PLT 42*  --   --  51* 64*  CREATININE  --   < > 1.31* 1.24 1.86*  < > = values in this interval not displayed. Estimated Creatinine Clearance: 46.3 mL/min (by C-G formula based on Cr of 1.86).  Recent Labs  09/01/15 0858  [facility] 26*     Microbiology: Recent Results (from the past 720 hour(s))  Culture, blood (single)     Status: None   Collection Time: 08/28/15  5:30 PM  Result Value Ref Range Status   Specimen Description BLOOD RIGHT HAND  Final   Special Requests BOTTLES DRAWN AEROBIC AND ANAEROBIC 5CC EACH  Final   Culture  Setup Time   Final    GRAM POSITIVE COCCI IN CLUSTERS IN BOTH AEROBIC AND ANAEROBIC BOTTLES CRITICAL RESULT CALLED TO, READ BACK BY AND VERIFIED WITH: DICKERSON,M AT 1610 ON 08/29/2015 BY WOODS,M    Culture   Final    METHICILLIN RESISTANT STAPHYLOCOCCUS AUREUS Performed at [facility]    Report Status 08/31/2015 FINAL  Final   Organism ID, Bacteria METHICILLIN RESISTANT STAPHYLOCOCCUS AUREUS  Final      Susceptibility   Methicillin resistant staphylococcus aureus - MIC*    CIPROFLOXACIN >=8 RESISTANT Resistant     ERYTHROMYCIN 0.5 SENSITIVE Sensitive     GENTAMICIN <=0.5 SENSITIVE Sensitive     OXACILLIN >=4 RESISTANT Resistant     TETRACYCLINE <=1 SENSITIVE Sensitive     VANCOMYCIN 1 SENSITIVE Sensitive     TRIMETH/SULFA >=320 RESISTANT Resistant     CLINDAMYCIN <=0.25 SENSITIVE Sensitive     RIFAMPIN <=0.5 SENSITIVE Sensitive     Inducible Clindamycin NEGATIVE Sensitive     * METHICILLIN RESISTANT STAPHYLOCOCCUS AUREUS   Medical History: Past Medical History  Diagnosis Date  . COPD (chronic obstructive pulmonary disease) ([place])   . Hypertension   . Hepatic steatosis 12/14/2011  . Compression fracture of L1 lumbar vertebra ([place]) 12/14/2011  . C2 cervical fracture ([place])   . Duodenal ulcer with hemorrhage 12/15/2011    Likely source of bleeding.per EGD; Dr. Laural Golden  . Esophageal varices without bleeding ([place]) 12/15/2011    per EGD  . Cirrhosis ([place]) 12/16/2011    per ultrasound  . Hx of substance abuse 12/17/2011  . Alcohol dependence with alcohol-induced persisting dementia ([place]) 12/18/2011  . Hepatic encephalopathy ([place])   . Helicobacter pylori antibody positive 12/19/2011  . Encephalopathy 05/18/2012  . UTI (urinary tract infection) 12/13/2011  . Ascites 05/18/2012    S/p paracentesis yielding 7880 mL  . Cirrhosis ([place])   . Depression    Anti-infectives    Start     Dose/Rate Route Frequency Ordered Stop   08/29/15 2200  vancomycin (VANCOCIN) IVPB 1000 mg/200 mL premix     1,000 mg 200 mL/hr  over 60 Minutes Intravenous Every 12 hours 08/29/15 1122     08/29/15 1000  piperacillin-tazobactam (ZOSYN) IVPB 3.375 g     3.375 g 12.5 mL/hr over 240 Minutes Intravenous Every 8 hours 08/29/15 0934     08/29/15 1000  vancomycin (VANCOCIN) 1,500 mg in sodium chloride 0.9 % 500 mL IVPB     1,500 mg 250 mL/hr over 120 Minutes Intravenous  Once 08/29/15 0936 08/29/15 1207   08/28/15 1845  cefTRIAXone (ROCEPHIN) 1 g in dextrose 5 % 50 mL IVPB  Status:  Discontinued     1 g 100 mL/hr over 30 Minutes Intravenous Every 24 hours 08/28/15 1835 08/29/15 0927   08/28/15 1845  azithromycin (ZITHROMAX) 500 mg in dextrose 5 % 250 mL IVPB  Status:  Discontinued     500  mg 250 mL/hr over 60 Minutes Intravenous Every 24 hours 08/28/15 1835 08/29/15 8366     Assessment: 70 year old man with known alcoholic cirrhosis who is noncompliant with medical treatment who presents to the hospital today with a myriad of complaints as stated above. He states 2 weeks ago he fell in the shower and suffered a laceration on his left knee after which his knee has become quite swollen. He states that 3 days ago he went on a "drunk binge" during which he has taken anywhere from 12-18 beers a day.  Blood cx positive for MRSA S- Vanc, TCN, Clindamycin. R-Septra, Oxacillin. Day #4 Vancomycin and Zosyn. Scr has increased. Vancomycin trough elevated at 26 mg/dl. Afebrile, WBC slightly improved. Needs repeat blood cultures. Orthopedic consult for possible septic knee and aspirated . Effusion of right knee without signs of sepsis.. Pt also has multiple areas over abdomen, legs, and arms that are red. MD aware and looks like related to fall in bathtub.  Goal of Therapy:  Vancomycin trough level 15-20 mcg/ml  Plan:  Change Vancomycin '1250mg'$  IV q24h Zosyn 3.375gm IV q8h, extended dosing interval ReCheck Vancomycin trough level at steady state Monitor labs, renal fxn  Isac Sarna, BS Vena [place], BCPS Clinical Pharmacist Pager [phone] 09/01/2015,10:24 AM

## 2015-09-01 NOTE — Progress Notes (Signed)
Critical value of Vanc Trough:  26 received. Pharmacist notified.  Hold 1000 dose.

## 2015-09-01 NOTE — Consult Note (Signed)
Reason for Consult: abdominal distention, cirrhosis Referring Physician: Hospitalist  Roberto [name] is an 70 y.o. male.  HPI: patient is unable to give hx this afternoon. He is moaning. RN present states he has been confused. Admitted  08/27/2015 with a swollen knee with an old laceration. He is well known to our practice. He apparently has continued to drink. He has been non-compliant with his medications.  Known hx of cirrhosis with ascites. He apparently was agitated and c/o rt upper quadrant pain and presented to the ED.  RN today reports he had some abdominal distention yesterday and the distention has increased significantly today. 07/22/2015 he underwent an [place] by me which revealed :IMPRESSION: Small amount of perihepatic and pelvic ascites.nsufficient ascites for paracentesis. He has a rash to his rt lower abdomen and noted to have bruising.  He has a rash to his rt lower and left leg.  Blood culture revealed: gram positive cocci in clusters in both aerobic and anaerobic bottles. Ammonia slightly high at 44.         Past Medical History  Diagnosis Date  . COPD (chronic obstructive pulmonary disease) (HCC)   . Hypertension   . Hepatic steatosis 12/14/2011  . Compression fracture of L1 lumbar vertebra (HCC) 12/14/2011  . C2 cervical fracture (HCC)   . Duodenal ulcer with hemorrhage 12/15/2011    Likely source of bleeding.per EGD; Dr. Karilyn Cota  . Esophageal varices without bleeding (HCC) 12/15/2011    per EGD  . Cirrhosis (HCC) 12/16/2011    per ultrasound  . Hx of substance abuse 12/17/2011  . 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 Hepatic encephalopathy (HCC)   . Helicobacter pylori antibody positive 12/19/2011  . Encephalopathy 05/18/2012  . UTI (urinary tract infection) 12/13/2011  . Ascites 05/18/2012    S/p paracentesis yielding 7880 mL  . Cirrhosis (HCC)   . Depression     Past Surgical History  Procedure Laterality Date  .  Coronary angioplasty with stent placement      History reviewed. No pertinent family history.  Social History:  reports that he has been smoking Cigarettes.  He has been smoking about 1.00 pack per day. He uses smokeless tobacco. He reports that he drinks alcohol. He reports that he uses illicit drugs (Cocaine).  Allergies: No Known Allergies  Medications: I have reviewed the patient's current medications.  Results for orders placed or performed during the hospital encounter of 08/27/15 (from the past 48 hour(s))  Basic metabolic panel     Status: Abnormal   Collection Time: 08/31/15  5:39 AM  Result Value Ref Range   Sodium 135 135 - 145 mmol/L   Potassium 3.4 (L) 3.5 - 5.1 mmol/L   Chloride 103 101 - 111 mmol/L   CO2 24 22 - 32 mmol/L   Glucose, Bld 93 65 - 99 mg/dL   BUN 33 (H) 6 - 20 mg/dL   Creatinine, Ser 8.82 (H) 0.61 - 1.24 mg/dL   Calcium 7.4 (L) 8.9 - 10.3 mg/dL   GFR calc non Af Amer 55 (L) >60 mL/min   GFR calc Af Amer >60 >60 mL/min    Comment: (NOTE) The eGFR has been calculated using the CKD EPI equation. This calculation has not been validated in all clinical situations. eGFR's persistently <60 mL/min signify possible Chronic Kidney Disease.    Anion gap 8 5 - 15  CBC     Status: Abnormal   Collection Time: 08/31/15  1:03 PM  Result Value  Ref Range   WBC 15.4 (H) 4.0 - 10.5 K/uL   RBC 3.05 (L) 4.22 - 5.81 MIL/uL   Hemoglobin 9.8 (L) 13.0 - 17.0 g/dL   HCT 28.7 (L) 39.0 - 52.0 %   MCV 94.1 78.0 - 100.0 fL   MCH 32.1 26.0 - 34.0 pg   MCHC 34.1 30.0 - 36.0 g/dL   RDW 15.7 (H) 11.5 - 15.5 %   Platelets 51 (L) 150 - 400 K/uL    Comment: SPECIMEN CHECKED FOR CLOTS CONSISTENT WITH PREVIOUS RESULT   Comprehensive metabolic panel     Status: Abnormal   Collection Time: 08/31/15  1:03 PM  Result Value Ref Range   Sodium 134 (L) 135 - 145 mmol/L   Potassium 3.4 (L) 3.5 - 5.1 mmol/L   Chloride 102 101 - 111 mmol/L   CO2 25 22 - 32 mmol/L   Glucose, Bld 133  (H) 65 - 99 mg/dL   BUN 33 (H) 6 - 20 mg/dL   Creatinine, Ser 1.24 0.61 - 1.24 mg/dL   Calcium 7.4 (L) 8.9 - 10.3 mg/dL   Total Protein 6.2 (L) 6.5 - 8.1 g/dL   Albumin 1.9 (L) 3.5 - 5.0 g/dL   AST 162 (H) 15 - 41 U/L   ALT 44 17 - 63 U/L   Alkaline Phosphatase 106 38 - 126 U/L   Total Bilirubin 3.2 (H) 0.3 - 1.2 mg/dL   GFR calc non Af Amer 58 (L) >60 mL/min   GFR calc Af Amer >60 >60 mL/min    Comment: (NOTE) The eGFR has been calculated using the CKD EPI equation. This calculation has not been validated in all clinical situations. eGFR's persistently <60 mL/min signify possible Chronic Kidney Disease.    Anion gap 7 5 - 15  Lactic acid, plasma     Status: None   Collection Time: 08/31/15  2:59 PM  Result Value Ref Range   Lactic Acid, Venous 1.5 0.5 - 2.0 mmol/L  CBC with Differential/Platelet     Status: Abnormal   Collection Time: 09/01/15  5:33 AM  Result Value Ref Range   WBC 13.3 (H) 4.0 - 10.5 K/uL   RBC 2.96 (L) 4.22 - 5.81 MIL/uL   Hemoglobin 9.6 (L) 13.0 - 17.0 g/dL   HCT 27.8 (L) 39.0 - 52.0 %   MCV 93.9 78.0 - 100.0 fL   MCH 32.4 26.0 - 34.0 pg   MCHC 34.5 30.0 - 36.0 g/dL   RDW 15.8 (H) 11.5 - 15.5 %   Platelets 64 (L) 150 - 400 K/uL    Comment: SPECIMEN CHECKED FOR CLOTS CONSISTENT WITH PREVIOUS RESULT    Neutrophils Relative % 63 %   Neutro Abs 8.4 (H) 1.7 - 7.7 K/uL   Lymphocytes Relative 18 %   Lymphs Abs 2.4 0.7 - 4.0 K/uL   Monocytes Relative 13 %   Monocytes Absolute 1.7 (H) 0.1 - 1.0 K/uL   Eosinophils Relative 5 %   Eosinophils Absolute 0.7 0.0 - 0.7 K/uL   Basophils Relative 1 %   Basophils Absolute 0.1 0.0 - 0.1 K/uL  Basic metabolic panel     Status: Abnormal   Collection Time: 09/01/15  5:33 AM  Result Value Ref Range   Sodium 134 (L) 135 - 145 mmol/L   Potassium 3.4 (L) 3.5 - 5.1 mmol/L   Chloride 102 101 - 111 mmol/L   CO2 25 22 - 32 mmol/L   Glucose, Bld 92 65 - 99 mg/dL   BUN  45 (H) 6 - 20 mg/dL   Creatinine, Ser 1.86 (H) 0.61 -  1.24 mg/dL   Calcium 7.3 (L) 8.9 - 10.3 mg/dL   GFR calc non Af Amer 36 (L) >60 mL/min   GFR calc Af Amer 42 (L) >60 mL/min    Comment: (NOTE) The eGFR has been calculated using the CKD EPI equation. This calculation has not been validated in all clinical situations. eGFR's persistently <60 mL/min signify possible Chronic Kidney Disease.    Anion gap 7 5 - 15  Vancomycin, trough     Status: Abnormal   Collection Time: 09/01/15  8:58 AM  Result Value Ref Range   Vancomycin Tr 26 (HH) 10.0 - 20.0 ug/mL    Comment: CRITICAL RESULT CALLED TO, READ BACK BY AND VERIFIED WITH: HAWKINS,M AT 9:20AM ON 09/01/15 BY FESTERMAN,C   Ammonia     Status: Abnormal   Collection Time: 09/01/15 12:48 PM  Result Value Ref Range   Ammonia 44 (H) 9 - 35 umol/L  Lactic acid, plasma     Status: None   Collection Time: 09/01/15  2:41 PM  Result Value Ref Range   Lactic Acid, Venous 1.5 0.5 - 2.0 mmol/L    Dg Abd 1 View  09/01/2015  CLINICAL DATA:  Abdominal distention. History of multiple medical problems including cirrhosis, GI bleed and renal insufficiency. EXAM: ABDOMEN - 1 VIEW COMPARISON:  Radiographs 08/27/2015.  CT 05/18/2012. FINDINGS: Portions of the abdomen are excluded from this single supine portable radiograph. There is moderate diffuse distention of the colon. There may be mild small bowel distension as well. There is no supine evidence of free intraperitoneal air. No definite signs of recurrent ascites are seen by the current examination. There are no suspicious abdominal calcifications. Compression deformities at T12, L1 and L3 appear grossly unchanged. IMPRESSION: Diffuse bowel distention, primarily appearing to reflect colon. Evaluation is limited by supine radiography, and distal colonic obstruction cannot be excluded. Consider radiographic follow up or CT for further evaluation. Electronically Signed   By: Richardean Sale M.D.   On: 09/01/2015 12:10    ROS Blood pressure 111/55, pulse 100,  temperature 98.9 F (37.2 C), temperature source Oral, resp. rate 20, height 6' (1.829 m), weight 212 lb (96.163 kg), SpO2 91 %. Physical Exam Moaning. He says he did not know he was in hospital.  Lungs are clear. Abdomen is distended. Rash and bruising noted to abdomen. Abdomen is distended. BS +. Patient appears critically ill. Healing laceration to rt knee. Rash to rt and left lower leg. Assessment/Plan: Possible ileus. Dr. Laural Golden in with patient. Orders for RN to place rectal tube.  CT scan is pending.. Further recommendation once we have CT back.  Thrombocytopenia related to his cirrhosis.  Nolberto Hanlon 09/01/2015, 4:12 PM    GI attending note: Patient is 71 year old Caucasian male who has alcoholic cirrhosis who is continuing to drink alcohol and was last seen in our office on 07/22/2015 with abdominal pain and distention and 19 pound weight gain. Ultrasound of upper abdomen was obtained and revealed very small amount of pelvic and perihepatic ascites not significant enough to be tapped. Patient came to emergency room on 08/27/2015 with shortness of breath and abdominal pain and weakness. Also complaining of right knee pain. He was hospitalized and felt to have alcohol withdrawal. 2 days later he spiked and was cultured and blood cultures are positive for staph aureus. He was switched from Zosyn to vancomycin. He also had right knee aspiration but no  studies obtained on the fluid with fluid was clear. ECHO was attempted earlier today but not possible because of lack of cooperation. Patient has developed abdominal distention over the last 2 days. Patient is restless Abdomen is distended. Bowel sounds are hypoactive. On palpation it is somewhat tense with mild diffuse tenderness but no guarding. Percussion note is very tympanitic. He has scab along anteromedial site of right knee without any drainage. He has a ring edema involving both legs. It is 2+ in the right leg and 1+ in the left  leg. Abdominal films from admission and this morning reviewed. Distended colon with some gas in the small bowel.   Assessment:  #1. Abdominal distention secondary to adynamic colon can read to narcotics acute illness and immobility. Lactulose is possibly making his distention worse. Therefore it could be temporarily withheld. #2. Alcoholic disease. He has mildly elevated AST which may indicate alcoholic hepatitis on top of alcoholic cirrhosis. Serum ammonia is elevated. He therefore has hepatic encephalopathy. #3. Staph aureus bacteremia. Doubt from abdominal source. Ultrasound from 5 weeks ago reveals small amount of ascites.  #4. Anemia. No evidence of overt GI bleed. Anemia appears to be chronic disease anemia. #5. Thrombocytopenia secondary to cirrhosis. #6. Renal dysfunction. Serum creatinine is going up. Oral intake is very poor.  Recommendations: Discontinue lactulose for now. Discontinue spironolactone and Lasix. Xifaxan 550 mg by mouth twice a day. D5 half-normal saline with KCl 20 mg/L at 100 mL per hour Rectal tube. Turn in bed every 3-4 hours. Metabolic 7 in a.m.

## 2015-09-01 NOTE — Progress Notes (Signed)
2040 Patient noted with RIGHT hand mit removed and pulled out IV catheter to LEFT distal arm/wrist region. Charge nurse made aware of need to continue sitter observation for patient d/t patient pulling IV catheter out, taking hand mitts off, crying out and attempting to get OOB w/o assistance.

## 2015-09-01 NOTE — Progress Notes (Signed)
Orthopaedics Fluid aspirate from right knee   Non purulent fluid  NOT sent for culture 2nd appeared like joint fluid

## 2015-09-02 ENCOUNTER — Inpatient Hospital Stay (HOSPITAL_COMMUNITY): Payer: Medicare Other

## 2015-09-02 LAB — CBC WITH DIFFERENTIAL/PLATELET
BASOS ABS: 0.1 10*3/uL (ref 0.0–0.1)
Basophils Relative: 0 %
Eosinophils Absolute: 0.6 10*3/uL (ref 0.0–0.7)
Eosinophils Relative: 4 %
HEMATOCRIT: 29.2 % — AB (ref 39.0–52.0)
HEMOGLOBIN: 9.9 g/dL — AB (ref 13.0–17.0)
LYMPHS PCT: 15 %
Lymphs Abs: 2.4 10*3/uL (ref 0.7–4.0)
MCH: 31.6 pg (ref 26.0–34.0)
MCHC: 33.9 g/dL (ref 30.0–36.0)
MCV: 93.3 fL (ref 78.0–100.0)
MONO ABS: 1.9 10*3/uL — AB (ref 0.1–1.0)
MONOS PCT: 12 %
NEUTROS ABS: 10.7 10*3/uL — AB (ref 1.7–7.7)
NEUTROS PCT: 68 %
PLATELETS: 95 10*3/uL — AB (ref 150–400)
RBC: 3.13 MIL/uL — ABNORMAL LOW (ref 4.22–5.81)
RDW: 15.9 % — AB (ref 11.5–15.5)
SMEAR REVIEW: DECREASED
WBC: 15.6 10*3/uL — ABNORMAL HIGH (ref 4.0–10.5)

## 2015-09-02 LAB — BASIC METABOLIC PANEL
ANION GAP: 6 (ref 5–15)
BUN: 61 mg/dL — AB (ref 6–20)
CALCIUM: 7.1 mg/dL — AB (ref 8.9–10.3)
CO2: 24 mmol/L (ref 22–32)
Chloride: 98 mmol/L — ABNORMAL LOW (ref 101–111)
Creatinine, Ser: 2.38 mg/dL — ABNORMAL HIGH (ref 0.61–1.24)
GFR calc Af Amer: 31 mL/min — ABNORMAL LOW (ref >60)
GFR, EST NON AFRICAN AMERICAN: 27 mL/min — AB (ref >60)
GLUCOSE: 110 mg/dL — AB (ref 65–99)
Potassium: 3.7 mmol/L (ref 3.5–5.1)
SODIUM: 128 mmol/L — AB (ref 135–145)

## 2015-09-02 MED ORDER — KCL IN DEXTROSE-NACL 20-5-0.45 MEQ/L-%-% IV SOLN
INTRAVENOUS | Status: AC
  Administered 2015-09-02: 22:00:00 via INTRAVENOUS

## 2015-09-02 MED ORDER — FLEET ENEMA 7-19 GM/118ML RE ENEM
1.0000 | ENEMA | Freq: Once | RECTAL | Status: AC
Start: 2015-09-02 — End: 2015-09-02
  Administered 2015-09-02: 1 via RECTAL

## 2015-09-02 MED ORDER — ENSURE ENLIVE PO LIQD
237.0000 mL | Freq: Two times a day (BID) | ORAL | Status: DC
  Administered 2015-09-02 – 2015-09-07 (×8): 237 mL via ORAL

## 2015-09-02 MED ORDER — SODIUM CHLORIDE 0.9 % IV SOLN
400.0000 mg | Freq: Two times a day (BID) | INTRAVENOUS | Status: DC
  Administered 2015-09-02: 400 mg via INTRAVENOUS
  Filled 2015-09-02 (×9): qty 400

## 2015-09-02 NOTE — Progress Notes (Signed)
1805IV access obtained by ICU RN.  Patient pulled IV out.  AC notified to attempt IV start.

## 2015-09-02 NOTE — Progress Notes (Signed)
Late entry:  Patient lost IV access this afternoon.  Patient difficult IV stick.  Unable to get access.  ICU RN to come and try.

## 2015-09-02 NOTE — Progress Notes (Addendum)
Patient c/o leg pain, weakness, +2 pulses BLE, +4 edema RLE and +3 edema LLE, states cannot feel in his toes - Dr. Janna Arch called and notified stated patient has been afraid to move his legs due to pain no new orders given at this time.

## 2015-09-02 NOTE — Progress Notes (Signed)
AC unable to obtain IV access after three attempts.  Dr. Selena Batten on-call and notified of lack of IV access and notifying vascular access for peripheral IV attempt.

## 2015-09-02 NOTE — Progress Notes (Signed)
Patient ID: Roberto [name], male   DOB: 1946-07-11, 70 y.o.   MRN: [id number] Seems somewhat more alert today. He did call me by my name. He has a very small amt of stool in rectal tube. His abdomen is still distended. He groans when he is turned. Filed Vitals:   09/02/15 1154 09/02/15 1322  BP:  116/75  Pulse:  85  Temp: 99.4 F (37.4 C) 98.3 F (36.8 C)  Resp:  20   CBC    Component Value Date/Time   WBC 15.6* 09/02/2015 0544   RBC 3.13* 09/02/2015 0544   RBC 1.36* 12/13/2011 1145   HGB 9.9* 09/02/2015 0544   HCT 29.2* 09/02/2015 0544   PLT 95* 09/02/2015 0544   MCV 93.3 09/02/2015 0544   MCH 31.6 09/02/2015 0544   MCHC 33.9 09/02/2015 0544   RDW 15.9* 09/02/2015 0544   LYMPHSABS 2.4 09/02/2015 0544   MONOABS 1.9* 09/02/2015 0544   EOSABS 0.6 09/02/2015 0544   BASOSABS 0.1 09/02/2015 0544   Anemia; No evidence of bleeding. Thrombocytopenia related to his cirrhosis.  Abdominal distention/ileus: Turn every 2-3 hrs as recommended. Will continue to monitor.

## 2015-09-02 NOTE — Progress Notes (Signed)
Patient had adynamic: Colon which may be multifactorial in its cause opioids bedrest. Rectal tube inserted much less distended CT reveals nodular cirrhosis opioids have been removed as have benzo diazepam and lactulose patient had gram-positive cocci in blood second set of blood cultures drawn yesterday still pending early on vancomycin fluid drawn from knee appeared clear to orthopedic was not sent for complete analysis Actiq acid within normal limits yesterday no evidence of septicemia function somewhat worse KALIX MEINECKE ZOX:[id number] DOB: Jun 20, 1946 DOA: 08/27/2015 PCP: Isabella Stalling, MD             Physical Exam: Blood pressure 124/69, pulse 83, temperature 99.4 F (37.4 C), temperature source Rectal, resp. rate 18, height 6' (1.829 m), weight 212 lb (96.163 kg), SpO2 94 %. lungs scattered rhonchi diminished breath sounds in the bases no rales wheeze rhonchi appreciable heart regular rhythm no murmurs goes heaves thrills rubs abdomen soft nontender bowel sounds normoactive less distention today peristaltic rushes   Investigations:  Recent Results (from the past 240 hour(s))  Culture, blood (single)     Status: None   Collection Time: 08/28/15  5:30 PM  Result Value Ref Range Status   Specimen Description BLOOD RIGHT HAND  Final   Special Requests BOTTLES DRAWN AEROBIC AND ANAEROBIC 5CC EACH  Final   Culture  Setup Time   Final    GRAM POSITIVE COCCI IN CLUSTERS IN BOTH AEROBIC AND ANAEROBIC BOTTLES CRITICAL RESULT CALLED TO, READ BACK BY AND VERIFIED WITH: DICKERSON,M AT 8119 ON 08/29/2015 BY WOODS,M    Culture   Final    METHICILLIN RESISTANT STAPHYLOCOCCUS AUREUS Performed at [facility]    Report Status 08/31/2015 FINAL  Final   Organism ID, Bacteria METHICILLIN RESISTANT STAPHYLOCOCCUS AUREUS  Final      Susceptibility   Methicillin resistant staphylococcus aureus - MIC*    CIPROFLOXACIN >=8 RESISTANT Resistant     ERYTHROMYCIN 0.5 SENSITIVE Sensitive      GENTAMICIN <=0.5 SENSITIVE Sensitive     OXACILLIN >=4 RESISTANT Resistant     TETRACYCLINE <=1 SENSITIVE Sensitive     VANCOMYCIN 1 SENSITIVE Sensitive     TRIMETH/SULFA >=320 RESISTANT Resistant     CLINDAMYCIN <=0.25 SENSITIVE Sensitive     RIFAMPIN <=0.5 SENSITIVE Sensitive     Inducible Clindamycin NEGATIVE Sensitive     * METHICILLIN RESISTANT STAPHYLOCOCCUS AUREUS  Culture, blood (Routine X 2) w Reflex to ID Panel     Status: None (Preliminary result)   Collection Time: 09/01/15 12:20 PM  Result Value Ref Range Status   Specimen Description BLOOD RIGHT ARM  Final   Special Requests   Final    BOTTLES DRAWN AEROBIC AND ANAEROBIC 14CC EACH  IMMUNE:COMPROMISED   Culture NO GROWTH < 24 HOURS  Final   Report Status PENDING  Incomplete  Culture, blood (Routine X 2) w Reflex to ID Panel     Status: None (Preliminary result)   Collection Time: 09/01/15 12:30 PM  Result Value Ref Range Status   Specimen Description BLOOD LEFT ARM  Final   Special Requests   Final    BOTTLES DRAWN AEROBIC AND ANAEROBIC 12CC EACH  IMMUNE:COMPROMISED   Culture NO GROWTH < 24 HOURS  Final   Report Status PENDING  Incomplete     Basic Metabolic Panel:  Recent Labs  14/78/29 0533 09/02/15 0544  NA 134* 128*  K 3.4* 3.7  CL 102 98*  CO2 25 24  GLUCOSE 92 110*  BUN 45* 61*  CREATININE  1.86* 2.38*  CALCIUM 7.3* 7.1*   Liver Function Tests:  Recent Labs  08/31/15 1303  AST 162*  ALT 44  ALKPHOS 106  BILITOT 3.2*  PROT 6.2*  ALBUMIN 1.9*     CBC:  Recent Labs  09/01/15 0533 09/02/15 0544  WBC 13.3* 15.6*  NEUTROABS 8.4* 10.7*  HGB 9.6* 9.9*  HCT 27.8* 29.2*  MCV 93.9 93.3  PLT 64* 95*    Ct Abdomen Pelvis Wo Contrast  09/01/2015  CLINICAL DATA:  Inpatient. Acute constipation. Abdominal distention and bloating. Cirrhosis. EXAM: CT ABDOMEN AND PELVIS WITHOUT CONTRAST TECHNIQUE: Multidetector CT imaging of the abdomen and pelvis was performed following the standard protocol  without IV contrast. COMPARISON:  Abdominal radiograph from earlier today. 05/18/2012 CT abdomen/ pelvis. FINDINGS: Images are motion degraded. Lower chest: Small layering left pleural effusion . Mild dependent atelectasis in the lingula and both lower lobes. Coronary atherosclerosis. Hepatobiliary: Diffuse hepatic steatosis. Diffusely irregular liver surface, in keeping with cirrhosis, unchanged. No gross liver mass. There is layering dense material in the nondistended gallbladder, likely representing vicarious excretion of recently administered IV contrast. No gallbladder wall thickening. No biliary ductal dilatation. Pancreas: Normal, with no mass or duct dilation. Spleen: Normal size. No mass. Adrenals/Urinary Tract: Normal adrenals. Mild fullness of the central renal collecting systems bilaterally, without overt hydronephrosis, probably due to the prominently distended urinary bladder. No bladder wall thickening. No urolithiasis. No contour deforming renal mass. Stomach/Bowel: Grossly normal stomach. Normal caliber small bowel with no small bowel wall thickening. Normal appendix. Oral contrast progresses to the ascending colon. There is prominent gaseous distention of the cecum, ascending colon, hepatic flexure of the colon and transverse colon. There is a gradual transition to a normal caliber descending colon. Mild gaseous distention of the proximal sigmoid colon. Collapsed distal sigmoid colon and rectum. No large bowel wall thickening or pericolonic fat stranding. Tiny gas bubbles layering in the cecum and appendix are favored to represent liquid stool, with no definite pneumatosis. No colonic diverticulosis. Rectal drain is in place with the tip in the lower rectum. Vascular/Lymphatic: Atherosclerotic nonaneurysmal abdominal aorta. No pathologically enlarged lymph nodes in the abdomen or pelvis. Reproductive: Mild prostatomegaly. Nonspecific internal prostatic calcifications. Other: No pneumoperitoneum.  Small volume ascites predominantly perihepatic. Small left inguinal hernia containing fat and ascitic fluid . Musculoskeletal: No aggressive appearing focal osseous lesions. There is apparent fragmentation of the visualized lower T8 vertebral body with associated paraspinal soft tissue swelling. Stable chronic moderate T12 vertebral compression fracture. Stable chronic mild L1 vertebral compression fracture. Mild compression fracture of the L3 vertebral body with large Schmorl's nodes, which is new since 05/18/2012. Moderate degenerative changes in the visualized thoracolumbar spine. IMPRESSION: 1. Moderate adynamic ileus of the colon. No evidence of bowel obstruction. 2. Apparent fragmentation of the visualized lower T8 vertebral body with associated paraspinal soft tissue swelling, suggesting an acute incompletely evaluated process such as a compression fracture. Dedicated thoracic spine radiographs or thoracic spine CT or MRI are advised. 3. Mild L3 vertebral compression fracture with large Schmorl's nodes, of uncertain chronicity, new since 05/18/2012. Stable chronic moderate T12 and mild L1 vertebral compression fractures. 4. Cirrhosis. Diffuse hepatic steatosis. No gross liver mass. Small volume ascites. 5. Distended bladder with fullness of the renal collecting systems without overt hydronephrosis. Mild prostatomegaly. Please exclude bladder outlet obstruction clinically. 6. Small layering left pleural effusion. 7. Coronary atherosclerosis. 8. Small left inguinal hernia. Electronically Signed   By: Delbert Phenix M.D.   On: 09/01/2015 19:23  Dg Abd 1 View  09/01/2015  CLINICAL DATA:  Abdominal distention. History of multiple medical problems including cirrhosis, GI bleed and renal insufficiency. EXAM: ABDOMEN - 1 VIEW COMPARISON:  Radiographs 08/27/2015.  CT 05/18/2012. FINDINGS: Portions of the abdomen are excluded from this single supine portable radiograph. There is moderate diffuse distention of the  colon. There may be mild small bowel distension as well. There is no supine evidence of free intraperitoneal air. No definite signs of recurrent ascites are seen by the current examination. There are no suspicious abdominal calcifications. Compression deformities at T12, L1 and L3 appear grossly unchanged. IMPRESSION: Diffuse bowel distention, primarily appearing to reflect colon. Evaluation is limited by supine radiography, and distal colonic obstruction cannot be excluded. Consider radiographic follow up or CT for further evaluation. Electronically Signed   By: Carey Bullocks M.D.   On: 09/01/2015 12:10      Medications:   Impression: Adynamic colon Principal Problem:   Cirrhosis (HCC) Active Problems:   HTN (hypertension)   Alcohol withdrawal (HCC)   ETOH abuse     Plan: Increase intravascular fluids continue vancomycin mobilize patient discontinue opioids at an ensure to diet. Await results of blood cultures and 2-D echo performed yet not interpreted  Consultants: Orthopedic surgery gastroenterology   Procedures   Antibiotics: Vancomycin                  Code Status:   Family Communication:    Disposition Plan see plan above  Time spent: 30 minutes   LOS: 5 days   Channing Yeager M   09/02/2015, 12:23 PM

## 2015-09-02 NOTE — Care Management Note (Signed)
Case Management Note  Patient Details  Name: Roberto [name] MRN: [id number] Date of Birth: 08/31/1945   Expected Discharge Date:                  Expected Discharge Plan:  Home w Home Health Services  In-House Referral:  NA  Discharge planning Services  CM Consult  Post Acute Care Choice:    Choice offered to:     DME Arranged:    DME Agency:     HH Arranged:    HH Agency:     Status of Service:  In process, will continue to follow  Medicare Important Message Given:    Date Medicare IM Given:    Medicare IM give by:    Date Additional Medicare IM Given:    Additional Medicare Important Message give by:     If discussed at Long Length of Stay Meetings, dates discussed:  09/02/2015  Additional Comments:  Malcolm Metro, RN 09/02/2015, 2:19 PM

## 2015-09-02 NOTE — Progress Notes (Signed)
Patient's son Roberto [name] called this Clinical research associate to report that he will be here to sign consent around 8am 09/03/15 for TEE. Will report to oncoming 1st shift nurse.

## 2015-09-02 NOTE — Progress Notes (Signed)
1945 Foley catheter inserted as ordered. 825cc tea colored urine noted in drainage bag. Patient reported feeling some relief.

## 2015-09-02 NOTE — Progress Notes (Signed)
Late entry:  Patient unable to lie still for MRI after 3 attempts.  Dr. Janna Arch called and notified.  No new orders at this time.

## 2015-09-02 NOTE — Progress Notes (Signed)
      [place] Antimicrobial Management Team Staphylococcus aureus bacteremia   Staphylococcus aureus bacteremia (SAB) is associated with a high rate of complications and mortality.  Specific aspects of clinical management are critical to optimizing the outcome of patients with SAB.  Therefore, the [facility] Antimicrobial Management Team [facility]) has initiated an intervention aimed at improving the management of SAB at [facility].  To do so, Infectious Diseases physicians are providing an evidence-based consult for the management of all patients with SAB.     Yes No Comments  Perform follow-up blood cultures (even if the patient is afebrile) to ensure clearance of bacteremia   BLOOD CULTURES DRAWN 09/01/15  Remove vascular catheter and obtain follow-up blood cultures after the removal of the catheter   DO NOT PLACE PICC OR CENTRAL LINE UNTIL WE HAVE CLEARANCE OF BLOOD CULTURES  Perform echocardiography to evaluate for endocarditis (transthoracic ECHO is 40-50% sensitive, TEE is > 90% sensitive)   Please keep in mind, that neither test can definitively EXCLUDE endocarditis, and that should clinical suspicion remain high for endocarditis the patient should then still be treated with an "endocarditis" duration of therapy = 6 weeks  TTE limited, TEE is pending    Consult electrophysiologist to evaluate implanted cardiac device (pacemaker, ICD)   NOT WITH AICD, PM TO MY KNOWLEDGE  Ensure source control   Have all abscesses been drained effectively? Have deep seeded infections (septic joints or osteomyelitis) had appropriate surgical debridement?  NOT CLEAR WHAT SOURCE IS THE LUNGS OR HER KNEE WHICH WAS THOUGHT TO BE POSSIBLY INFECTED  SHE HAD ASPIRATE OF THE KNEE BUT NO  CELL COUNT AND DIFFERENTIAL OR CULTURE FROM THE KNEE because Orthopedics felt it was not infected based on visual inspection  WOULD consider  MRI OF THE KNEE preferably with contrast when renal fxn  better     Investigate for "metastatic" sites of infection   Does the patient have ANY symptom or physical exam finding that would suggest a deeper infection (back or neck pain that may be suggestive of vertebral osteomyelitis or epidural abscess, muscle pain that could be a symptom of pyomyositis)?  Keep in mind that for deep seeded infections MRI imaging with contrast is preferred rather than other often insensitive tests such as plain x-rays, especially early in a patient's presentation.  sEE ABOVE DISCUSSION MY BIGGEST CONCERN IF FOR HEART VALVES AND POSSIBLY INFECTED KNEE JOINT,.   Also he has abnormalities in T and L spine. He could very well have diskitis.   Would consider MRI T and L spine    Change antibiotic therapy to [facility] given worsening renal failure but desire to use drug that is active in the lungs   Beta-lactam antibiotics are preferred for MSSA due to higher cure rates.   If on Vancomycin, goal trough should be 15 - 20 mcg/mL  Estimated duration of IV antibiotic therapy:  6-8 WEEKS   Consult case management for probably prolonged outpatient IV antibiotic therapy   Case discussed with Dr. Delbert Harness

## 2015-09-02 NOTE — Progress Notes (Signed)
RN called and notified Dr. Janna Arch of patient's BUN and Creatinine, rash, c/o BLE pain and states hurts to bad to move toes, good pulses.  Dr. Janna Arch gave order to increase current fluids to 150 ml/hr for 8 hours and then reduce back to 100 ml/hr after 8 hours.

## 2015-09-02 NOTE — Progress Notes (Signed)
Patient was seen earlier this evening. CT findings noted. Abdomen noted to be less distended than yesterday. Rectal tube was removed. Patient had spontaneous bowel movement and passed soft stool. Rectal examination performed both in left and right lateral recumbent position and he was able to expel flatus. Given CT findings patient may have obstructive uropathy. Therefore I asked nursing staff to put [facility] catheter. Deteriorating renal function may be secondary to obstructive uropathy. Patient was also given one Fleet enema. Will leave rectal tube out.

## 2015-09-03 ENCOUNTER — Inpatient Hospital Stay (HOSPITAL_COMMUNITY): Payer: Medicare Other

## 2015-09-03 DIAGNOSIS — D696 Thrombocytopenia, unspecified: Secondary | ICD-10-CM

## 2015-09-03 DIAGNOSIS — R011 Cardiac murmur, unspecified: Secondary | ICD-10-CM

## 2015-09-03 DIAGNOSIS — K7031 Alcoholic cirrhosis of liver with ascites: Secondary | ICD-10-CM

## 2015-09-03 DIAGNOSIS — R7881 Bacteremia: Secondary | ICD-10-CM

## 2015-09-03 DIAGNOSIS — N289 Disorder of kidney and ureter, unspecified: Secondary | ICD-10-CM

## 2015-09-03 DIAGNOSIS — R140 Abdominal distension (gaseous): Secondary | ICD-10-CM

## 2015-09-03 DIAGNOSIS — D62 Acute posthemorrhagic anemia: Secondary | ICD-10-CM

## 2015-09-03 DIAGNOSIS — F10230 Alcohol dependence with withdrawal, uncomplicated: Secondary | ICD-10-CM

## 2015-09-03 LAB — CBC WITH DIFFERENTIAL/PLATELET
Basophils Absolute: 0.1 10*3/uL (ref 0.0–0.1)
Basophils Relative: 0 %
EOS ABS: 0.5 10*3/uL (ref 0.0–0.7)
EOS PCT: 3 %
HCT: 26.9 % — ABNORMAL LOW (ref 39.0–52.0)
Hemoglobin: 9.3 g/dL — ABNORMAL LOW (ref 13.0–17.0)
LYMPHS ABS: 1.9 10*3/uL (ref 0.7–4.0)
Lymphocytes Relative: 12 %
MCH: 32.2 pg (ref 26.0–34.0)
MCHC: 34.6 g/dL (ref 30.0–36.0)
MCV: 93.1 fL (ref 78.0–100.0)
Monocytes Absolute: 1.2 10*3/uL — ABNORMAL HIGH (ref 0.1–1.0)
Monocytes Relative: 8 %
Neutro Abs: 12.3 10*3/uL — ABNORMAL HIGH (ref 1.7–7.7)
Neutrophils Relative %: 77 %
PLATELETS: 135 10*3/uL — AB (ref 150–400)
RBC: 2.89 MIL/uL — AB (ref 4.22–5.81)
RDW: 16 % — ABNORMAL HIGH (ref 11.5–15.5)
WBC: 16 10*3/uL — AB (ref 4.0–10.5)

## 2015-09-03 LAB — BASIC METABOLIC PANEL
Anion gap: 7 (ref 5–15)
BUN: 72 mg/dL — AB (ref 6–20)
CALCIUM: 6.9 mg/dL — AB (ref 8.9–10.3)
CHLORIDE: 98 mmol/L — AB (ref 101–111)
CO2: 22 mmol/L (ref 22–32)
CREATININE: 2.62 mg/dL — AB (ref 0.61–1.24)
GFR calc Af Amer: 27 mL/min — ABNORMAL LOW (ref >60)
GFR calc non Af Amer: 24 mL/min — ABNORMAL LOW (ref >60)
Glucose, Bld: 132 mg/dL — ABNORMAL HIGH (ref 65–99)
Potassium: 4.7 mmol/L (ref 3.5–5.1)
SODIUM: 127 mmol/L — AB (ref 135–145)

## 2015-09-03 MED ORDER — SODIUM CHLORIDE 0.9 % IV SOLN
300.0000 mg | Freq: Two times a day (BID) | INTRAVENOUS | Status: DC
  Filled 2015-09-03 (×6): qty 300

## 2015-09-03 MED ORDER — OXYCODONE HCL 5 MG PO TABS
5.0000 mg | ORAL_TABLET | Freq: Once | ORAL | Status: AC
  Administered 2015-09-03: 5 mg via ORAL
  Filled 2015-09-03: qty 1

## 2015-09-03 MED ORDER — SODIUM CHLORIDE 0.9 % IV SOLN
INTRAVENOUS | Status: DC
  Administered 2015-09-03: 13:00:00 via INTRAVENOUS
  Administered 2015-09-04: 50 mL/h via INTRAVENOUS
  Administered 2015-09-04: 125 mL/h via INTRAVENOUS
  Administered 2015-09-04 – 2015-09-06 (×3): via INTRAVENOUS

## 2015-09-03 MED ORDER — LORAZEPAM 2 MG/ML IJ SOLN
0.5000 mg | Freq: Once | INTRAMUSCULAR | Status: AC | PRN
  Administered 2015-09-03: 0.5 mg via INTRAVENOUS
  Filled 2015-09-03: qty 1

## 2015-09-03 MED ORDER — KCL IN DEXTROSE-NACL 20-5-0.45 MEQ/L-%-% IV SOLN: INTRAVENOUS | Status: DC

## 2015-09-03 MED ORDER — SODIUM CHLORIDE 0.9 % IV SOLN
400.0000 mg | Freq: Two times a day (BID) | INTRAVENOUS | Status: DC
  Administered 2015-09-03 – 2015-09-07 (×9): 400 mg via INTRAVENOUS
  Filled 2015-09-03 (×13): qty 400

## 2015-09-03 MED ORDER — PERFLUTREN LIPID MICROSPHERE
1.0000 mL | INTRAVENOUS | Status: AC | PRN
  Administered 2015-09-03: 4 mL via INTRAVENOUS
  Filled 2015-09-03: qty 10

## 2015-09-03 NOTE — Progress Notes (Signed)
PT Cancellation Note  Patient Details Name: Roberto [name] MRN: [id number] DOB: 02/25/1946   Cancelled Treatment:    Reason Eval/Treat Not Completed: Patient's level of consciousness.  Pt unable to arouse enough to be able to be able to participate in evaluation.  Will try again on Monday.  Per son, pt has had multiple falls at home.   Myrlene Broker L  PT 09/03/2015, 9:13 AM [phone]

## 2015-09-03 NOTE — Progress Notes (Signed)
[place] Antimicrobial Management Team Staphylococcus aureus bacteremia   Staphylococcus aureus bacteremia (SAB) is associated with a high rate of complications and mortality.  Specific aspects of clinical management are critical to optimizing the outcome of patients with SAB.  Therefore, the [facility] Antimicrobial Management Team [facility]) has initiated an intervention aimed at improving the management of SAB at [facility].  To do so, Infectious Diseases physicians are providing an evidence-based consult for the management of all patients with SAB.     Yes No Comments  Perform follow-up blood cultures (even if the patient is afebrile) to ensure clearance of bacteremia   BLOOD CULTURES DRAWN 09/01/15 NGTD  Remove vascular catheter and obtain follow-up blood cultures after the removal of the catheter   DO NOT PLACE PICC OR CENTRAL LINE UNTIL WE HAVE CLEARANCE OF BLOOD CULTURES  Perform echocardiography to evaluate for endocarditis (transthoracic ECHO is 40-50% sensitive, TEE is > 90% sensitive)   Please keep in mind, that neither test can definitively EXCLUDE endocarditis, and that should clinical suspicion remain high for endocarditis the patient should then still be treated with an "endocarditis" duration of therapy = 6 weeks  TTE limited, TEE considered but pt had varices before    Consult electrophysiologist to evaluate implanted cardiac device (pacemaker, ICD)   NOT WITH AICD, PM TO MY KNOWLEDGE  Ensure source control   Have all abscesses been drained effectively? Have deep seeded infections (septic joints or osteomyelitis) had appropriate surgical debridement?  NOT CLEAR WHAT SOURCE IS THE LUNGS OR HER KNEE WHICH WAS THOUGHT TO BE POSSIBLY INFECTED  SHE HAD ASPIRATE OF THE KNEE BUT NO  CELL COUNT AND DIFFERENTIAL OR CULTURE FROM THE KNEE because Orthopedics felt it was not infected based on visual inspection  WOULD consider  MRI OF THE KNEE preferably with  contrast when renal fxn better     Investigate for "metastatic" sites of infection   Does the patient have ANY symptom or physical exam finding that would suggest a deeper infection (back or neck pain that may be suggestive of vertebral osteomyelitis or epidural abscess, muscle pain that could be a symptom of pyomyositis)?  Keep in mind that for deep seeded infections MRI imaging with contrast is preferred rather than other often insensitive tests such as plain x-rays, especially early in a patient's presentation.  sEE ABOVE DISCUSSION MY BIGGEST CONCERN IF FOR HEART VALVES AND POSSIBLY INFECTED KNEE JOINT,.   Also he has abnormalities in T and L spine. He could very well have diskitis.   Would consider MRI T and L spine    Change antibiotic therapy to [facility] given worsening renal failure but desire to use drug that is active in the lungs   Beta-lactam antibiotics are preferred for MSSA due to higher cure rates.   If on Vancomycin, goal trough should be 15 - 20 mcg/mL  Estimated duration of IV antibiotic therapy:  6-8 WEEKS   Consult case management for probably prolonged outpatient IV antibiotic therapy   I would NOT PUSH HARD FOR TEE and would instead plan to treat the patient with 6 if not 8 weeks of parenteral therapy  For now would continue with IV teflaro with renal adjustment  I suspect he may need to go to SNF but regardless of whether he goes to SNF or home he will need to have a pharmacy involved in his care that can adjust his antibiotics based on renal function and help co-manage the patient with  me   I will arrange for followup in the next 4 weeks with our group    Case discussed with Dr. Delbert Harness

## 2015-09-03 NOTE — Progress Notes (Signed)
Discussed with cardiology the risk-benefit of TEE ER attempting a transthoracic echo repeat today to see if there is a good acoustic window or visualization of the valves issue more alert at a bedside talkative sodium 127 Will change to 0.9 normal saline creatinine somewhat worse 2.6 Roberto [name] XBJ:[id number] DOB: 03-Aug-1945 DOA: 08/27/2015 PCP: Isabella Stalling, MD             Physical Exam: Blood pressure 131/77, pulse 84, temperature 98.1 F (36.7 C), temperature source Oral, resp. rate 18, height 6' (1.829 m), weight 212 lb (96.163 kg), SpO2 94 %. lungs clear to A&P no rales wheeze rhonchi heart regular rhythm no murmurs goes heaves thrills rubs no S3 S1 heaves abdomen soft nontender bowel sounds normoactive no guarding or rebound masses no megaly right knee less rubor less swelling less tenderness   Investigations:  Recent Results (from the past 240 hour(s))  Culture, blood (single)     Status: None   Collection Time: 08/28/15  5:30 PM  Result Value Ref Range Status   Specimen Description BLOOD RIGHT HAND  Final   Special Requests BOTTLES DRAWN AEROBIC AND ANAEROBIC 5CC EACH  Final   Culture  Setup Time   Final    GRAM POSITIVE COCCI IN CLUSTERS IN BOTH AEROBIC AND ANAEROBIC BOTTLES CRITICAL RESULT CALLED TO, READ BACK BY AND VERIFIED WITH: DICKERSON,M AT 0848 ON 08/29/2015 BY WOODS,M    Culture   Final    METHICILLIN RESISTANT STAPHYLOCOCCUS AUREUS Performed at [facility]    Report Status 08/31/2015 FINAL  Final   Organism ID, Bacteria METHICILLIN RESISTANT STAPHYLOCOCCUS AUREUS  Final      Susceptibility   Methicillin resistant staphylococcus aureus - MIC*    CIPROFLOXACIN >=8 RESISTANT Resistant     ERYTHROMYCIN 0.5 SENSITIVE Sensitive     GENTAMICIN <=0.5 SENSITIVE Sensitive     OXACILLIN >=4 RESISTANT Resistant     TETRACYCLINE <=1 SENSITIVE Sensitive     VANCOMYCIN 1 SENSITIVE Sensitive     TRIMETH/SULFA >=320 RESISTANT Resistant     CLINDAMYCIN  <=0.25 SENSITIVE Sensitive     RIFAMPIN <=0.5 SENSITIVE Sensitive     Inducible Clindamycin NEGATIVE Sensitive     * METHICILLIN RESISTANT STAPHYLOCOCCUS AUREUS  Culture, blood (Routine X 2) w Reflex to ID Panel     Status: None (Preliminary result)   Collection Time: 09/01/15 12:20 PM  Result Value Ref Range Status   Specimen Description BLOOD RIGHT ARM  Final   Special Requests   Final    BOTTLES DRAWN AEROBIC AND ANAEROBIC 14CC EACH  IMMUNE:COMPROMISED   Culture NO GROWTH 2 DAYS  Final   Report Status PENDING  Incomplete  Culture, blood (Routine X 2) w Reflex to ID Panel     Status: None (Preliminary result)   Collection Time: 09/01/15 12:30 PM  Result Value Ref Range Status   Specimen Description BLOOD LEFT ARM  Final   Special Requests   Final    BOTTLES DRAWN AEROBIC AND ANAEROBIC 12CC EACH  IMMUNE:COMPROMISED   Culture NO GROWTH 2 DAYS  Final   Report Status PENDING  Incomplete     Basic Metabolic Panel:  Recent Labs  30/86/57 0544 09/03/15 0621  NA 128* 127*  K 3.7 4.7  CL 98* 98*  CO2 24 22  GLUCOSE 110* 132*  BUN 61* 72*  CREATININE 2.38* 2.62*  CALCIUM 7.1* 6.9*   Liver Function Tests:  Recent Labs  08/31/15 1303  AST 162*  ALT 44  ALKPHOS  106  BILITOT 3.2*  PROT 6.2*  ALBUMIN 1.9*     CBC:  Recent Labs  09/02/15 0544 09/03/15 0621  WBC 15.6* 16.0*  NEUTROABS 10.7* 12.3*  HGB 9.9* 9.3*  HCT 29.2* 26.9*  MCV 93.3 93.1  PLT 95* 135*    Ct Abdomen Pelvis Wo Contrast  09/01/2015  CLINICAL DATA:  Inpatient. Acute constipation. Abdominal distention and bloating. Cirrhosis. EXAM: CT ABDOMEN AND PELVIS WITHOUT CONTRAST TECHNIQUE: Multidetector CT imaging of the abdomen and pelvis was performed following the standard protocol without IV contrast. COMPARISON:  Abdominal radiograph from earlier today. 05/18/2012 CT abdomen/ pelvis. FINDINGS: Images are motion degraded. Lower chest: Small layering left pleural effusion . Mild dependent atelectasis in  the lingula and both lower lobes. Coronary atherosclerosis. Hepatobiliary: Diffuse hepatic steatosis. Diffusely irregular liver surface, in keeping with cirrhosis, unchanged. No gross liver mass. There is layering dense material in the nondistended gallbladder, likely representing vicarious excretion of recently administered IV contrast. No gallbladder wall thickening. No biliary ductal dilatation. Pancreas: Normal, with no mass or duct dilation. Spleen: Normal size. No mass. Adrenals/Urinary Tract: Normal adrenals. Mild fullness of the central renal collecting systems bilaterally, without overt hydronephrosis, probably due to the prominently distended urinary bladder. No bladder wall thickening. No urolithiasis. No contour deforming renal mass. Stomach/Bowel: Grossly normal stomach. Normal caliber small bowel with no small bowel wall thickening. Normal appendix. Oral contrast progresses to the ascending colon. There is prominent gaseous distention of the cecum, ascending colon, hepatic flexure of the colon and transverse colon. There is a gradual transition to a normal caliber descending colon. Mild gaseous distention of the proximal sigmoid colon. Collapsed distal sigmoid colon and rectum. No large bowel wall thickening or pericolonic fat stranding. Tiny gas bubbles layering in the cecum and appendix are favored to represent liquid stool, with no definite pneumatosis. No colonic diverticulosis. Rectal drain is in place with the tip in the lower rectum. Vascular/Lymphatic: Atherosclerotic nonaneurysmal abdominal aorta. No pathologically enlarged lymph nodes in the abdomen or pelvis. Reproductive: Mild prostatomegaly. Nonspecific internal prostatic calcifications. Other: No pneumoperitoneum. Small volume ascites predominantly perihepatic. Small left inguinal hernia containing fat and ascitic fluid . Musculoskeletal: No aggressive appearing focal osseous lesions. There is apparent fragmentation of the visualized  lower T8 vertebral body with associated paraspinal soft tissue swelling. Stable chronic moderate T12 vertebral compression fracture. Stable chronic mild L1 vertebral compression fracture. Mild compression fracture of the L3 vertebral body with large Schmorl's nodes, which is new since 05/18/2012. Moderate degenerative changes in the visualized thoracolumbar spine. IMPRESSION: 1. Moderate adynamic ileus of the colon. No evidence of bowel obstruction. 2. Apparent fragmentation of the visualized lower T8 vertebral body with associated paraspinal soft tissue swelling, suggesting an acute incompletely evaluated process such as a compression fracture. Dedicated thoracic spine radiographs or thoracic spine CT or MRI are advised. 3. Mild L3 vertebral compression fracture with large Schmorl's nodes, of uncertain chronicity, new since 05/18/2012. Stable chronic moderate T12 and mild L1 vertebral compression fractures. 4. Cirrhosis. Diffuse hepatic steatosis. No gross liver mass. Small volume ascites. 5. Distended bladder with fullness of the renal collecting systems without overt hydronephrosis. Mild prostatomegaly. Please exclude bladder outlet obstruction clinically. 6. Small layering left pleural effusion. 7. Coronary atherosclerosis. 8. Small left inguinal hernia. Electronically Signed   By: Delbert Phenix M.D.   On: 09/01/2015 19:23      Medications:   Impression:  Principal Problem:   Cirrhosis (HCC) Active Problems:   HTN (hypertension)   Alcohol withdrawal (  HCC)   ETOH abuse     Plan: Continue antibiotics duration unknown repeat attempted transthoracic echo was renal function daily change IV fluids to 0.9 normal saline at 1 25 mL an hour  Consultants: Orthopedic surgery infectious disease and cardiology   Procedures transthoracic echo today   Antibiotics:                   Code Status: Full  Family Communication:    Disposition Plan   Time spent: 30 minutes   LOS: 6 days    Tanielle Emigh M   09/03/2015, 12:29 PM

## 2015-09-03 NOTE — Progress Notes (Signed)
    Reviewed chart and discussed patient with Dr. Janna Arch. TEE had been requested to evaluate for endocarditis due to Staphylococcus aureus bacteremia. This is a medically complex patient with active encephalopathy in the setting of cirrhosis with alcohol abuse. Chart also indicates prior history of C2 fracture as well as esophageal varices by prior EGD. He has not yet had a transthoracic echocardiogram - procedure was aborted by echo tech earlier in the week due to poor cooperation by the patient (no images available for review). Although not absolutely contraindicated, a TEE would carry a high risk in this patient (potential esophageal injury, airway protection, uncertain status of cervical spine) and would need to be done with the assistance of anesthesia. I would recommend at least starting with a transthoracic echocardiogram. Plan is to have this obtained today with some sedation provided to the patient by primary team. If this study is inconclusive and concerns remain about endocarditis, a high risk TEE with anesthesia assistance could be considered. Alternatively, he could be treated for 4-6 weeks with antibiotics and forego putting him through this procedure.   Jonelle Sidle, M.D., F.A.C.C.

## 2015-09-03 NOTE — Progress Notes (Signed)
[id number] Patient yelling out c/o ABD & bilateral feet pain. MD notified and new order given to give one time dose of Oxycodone  PO x time only.

## 2015-09-03 NOTE — Progress Notes (Addendum)
  Subjective:  Patient has no complaints. He states he is hungry. He denies chest pain shortness of breath or abdominal pain. He states his abdomen is less distended.  Objective: Blood pressure 131/77, pulse 84, temperature 98.1 F (36.7 C), temperature source Oral, resp. rate 18, height 6' (1.829 m), weight 212 lb (96.163 kg), SpO2 94 %. Patient is alert and does not have asterixis. Abdomen is less distended than yesterday. Bowel sounds are normal. On palpation abdomen is mildly tense but not tender. He has 1+ pitting edema involving both legs.  Urine output last 2 shifts 825 mL   Labs/studies Results:   Recent Labs  09/01/15 0533 09/02/15 0544 09/03/15 0621  WBC 13.3* 15.6* 16.0*  HGB 9.6* 9.9* 9.3*  HCT 27.8* 29.2* 26.9*  PLT 64* 95* 135*    BMET   Recent Labs  09/01/15 0533 09/02/15 0544 09/03/15 0621  NA 134* 128* 127*  K 3.4* 3.7 4.7  CL 102 98* 98*  CO2 GLUCOSE 92 110* 132*  BUN 45* 61* 72*  CREATININE 1.86* 2.38* 2.62*  CALCIUM 7.3* 7.1* 6.9*      Assessment:  #1. Abdominal distention secondary to adynamic colon. Significant improvement in the last 24 hours. Rectal tube was removed yesterday. No bowel movement reported today. #2. 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 Lactulose was discontinued 2 days ago because of abdominal distention and he is on Xifaxan. #3. Staph aureus bacteremia. Source not identified. Patient has been switched to Ceftaroline because of worsening renal function. Patient is scheduled to undergo repeat ECHO today. If this study is inconclusive and TEE as needed consider EGD prior to TEE to make sure he does not have large varices. He had grade 1 esophageal varices on EGD of May 2013 there is a good chance that varices have increased in size given that he has continued to drink alcohol. #4. Anemia. No evidence of overt GI bleed. Anemia appears to be due to acute and chronic illness. #5. Non-oliguric renal  failure. Lateral was distended on abdominopelvic CT. Foleys catheter was inserted yesterday with good urine output but creatinine has gone up. Renal dysfunction most likely secondary to vancomycin which was stopped yesterday. #6. Mild hyponatremia. It may be dilutional. Will monitor his electrolytes.  Recommendations:  Heart healthy diet. Fleet enema 1 tonight. Metabolic 7 in a.m. Serum ammonia in a.m.

## 2015-09-03 NOTE — Care Management Important Message (Signed)
Important Message  Patient Details  Name: Roberto [name] MRN: [id number] Date of Birth: 1946/07/03   Medicare Important Message Given:  Yes    Malcolm Metro, RN 09/03/2015, 2:27 PM

## 2015-09-03 NOTE — Progress Notes (Addendum)
ANTIBIOTIC CONSULT NOTE   Pharmacy Consult for  Indication: MRSA bacteremia  No Known Allergies  Patient Measurements: Height: 6' (182.9 cm) Weight: 212 lb (96.163 kg) IBW/kg (Calculated) : 77.6  Vital Signs: Temp: 98.1 F (36.7 C) (02/03 0609) Temp Source: Oral (02/03 0609) BP: 111/60 mmHg (02/03 0609) Pulse Rate: 84 (02/03 0609) Intake/Output from previous day: 02/02 0701 - 02/03 0700 In: -  Out: 825 [Urine:825] Intake/Output from this shift:    Labs:  Recent Labs  09/01/15 0533 09/02/15 0544 09/03/15 0621  WBC 13.3* 15.6* 16.0*  HGB 9.6* 9.9* 9.3*  PLT 64* 95* 135*  CREATININE 1.86* 2.38* 2.62*   Estimated Creatinine Clearance: 32.9 mL/min (by C-G formula based on Cr of 2.62).  Recent Labs  09/01/15 0858  [facility] 26*     Microbiology: Recent Results (from the past 720 hour(s))  Culture, blood (single)     Status: None   Collection Time: 08/28/15  5:30 PM  Result Value Ref Range Status   Specimen Description BLOOD RIGHT HAND  Final   Special Requests BOTTLES DRAWN AEROBIC AND ANAEROBIC 5CC EACH  Final   Culture  Setup Time   Final    GRAM POSITIVE COCCI IN CLUSTERS IN BOTH AEROBIC AND ANAEROBIC BOTTLES CRITICAL RESULT CALLED TO, READ BACK BY AND VERIFIED WITH: DICKERSON,M AT 6734 ON 08/29/2015 BY WOODS,M    Culture   Final    METHICILLIN RESISTANT STAPHYLOCOCCUS AUREUS Performed at [facility]    Report Status 08/31/2015 FINAL  Final   Organism ID, Bacteria METHICILLIN RESISTANT STAPHYLOCOCCUS AUREUS  Final      Susceptibility   Methicillin resistant staphylococcus aureus - MIC*    CIPROFLOXACIN >=8 RESISTANT Resistant     ERYTHROMYCIN 0.5 SENSITIVE Sensitive     GENTAMICIN <=0.5 SENSITIVE Sensitive     OXACILLIN >=4 RESISTANT Resistant     TETRACYCLINE <=1 SENSITIVE Sensitive     VANCOMYCIN 1 SENSITIVE Sensitive     TRIMETH/SULFA >=320 RESISTANT Resistant     CLINDAMYCIN <=0.25 SENSITIVE Sensitive     RIFAMPIN <=0.5 SENSITIVE  Sensitive     Inducible Clindamycin NEGATIVE Sensitive     * METHICILLIN RESISTANT STAPHYLOCOCCUS AUREUS  Culture, blood (Routine X 2) w Reflex to ID Panel     Status: None (Preliminary result)   Collection Time: 09/01/15 12:20 PM  Result Value Ref Range Status   Specimen Description BLOOD RIGHT ARM  Final   Special Requests   Final    BOTTLES DRAWN AEROBIC AND ANAEROBIC 14CC EACH  IMMUNE:COMPROMISED   Culture NO GROWTH 2 DAYS  Final   Report Status PENDING  Incomplete  Culture, blood (Routine X 2) w Reflex to ID Panel     Status: None (Preliminary result)   Collection Time: 09/01/15 12:30 PM  Result Value Ref Range Status   Specimen Description BLOOD LEFT ARM  Final   Special Requests   Final    BOTTLES DRAWN AEROBIC AND ANAEROBIC 12CC EACH  IMMUNE:COMPROMISED   Culture NO GROWTH 2 DAYS  Final   Report Status PENDING  Incomplete   Medical History: Past Medical History  Diagnosis Date  . COPD (chronic obstructive pulmonary disease) ([place])   . Hypertension   . Hepatic steatosis 12/14/2011  . Compression fracture of L1 lumbar vertebra ([place]) 12/14/2011  . C2 cervical fracture ([place])   . Duodenal ulcer with hemorrhage 12/15/2011    Likely source of bleeding.per EGD; Dr. Laural Golden  . Esophageal varices without bleeding ([place]) 12/15/2011    per  EGD  . Cirrhosis ([place]) 12/16/2011    per ultrasound  . Hx of substance abuse 12/17/2011  . Alcohol dependence with alcohol-induced persisting dementia ([place]) 12/18/2011  . Hepatic encephalopathy ([place])   . Helicobacter pylori antibody positive 12/19/2011  . Encephalopathy 05/18/2012  . UTI (urinary tract infection) 12/13/2011  . Ascites 05/18/2012    S/p paracentesis yielding 7880 mL  . Cirrhosis ([place])   . Depression    Anti-infectives    Start     Dose/Rate Route Frequency Ordered Stop   09/02/15 1400  ceftaroline (TEFLARO) 400 mg in sodium chloride 0.9 % 250 mL IVPB     400 mg 250 mL/hr over 60 Minutes Intravenous Every 12 hours 09/02/15 1356      09/01/15 2000  vancomycin (VANCOCIN) 1,250 mg in sodium chloride 0.9 % 250 mL IVPB  Status:  Discontinued     1,250 mg 166.7 mL/hr over 90 Minutes Intravenous Every 24 hours 09/01/15 1042 09/02/15 1356   09/01/15 1800  rifaximin (XIFAXAN) tablet 550 mg     550 mg Oral 2 times daily 09/01/15 1715     08/29/15 2200  vancomycin (VANCOCIN) IVPB 1000 mg/200 mL premix  Status:  Discontinued     1,000 mg 200 mL/hr over 60 Minutes Intravenous Every 12 hours 08/29/15 1122 09/01/15 1042   08/29/15 1000  piperacillin-tazobactam (ZOSYN) IVPB 3.375 g  Status:  Discontinued     3.375 g 12.5 mL/hr over 240 Minutes Intravenous Every 8 hours 08/29/15 0934 09/01/15 1044   08/29/15 1000  vancomycin (VANCOCIN) 1,500 mg in sodium chloride 0.9 % 500 mL IVPB     1,500 mg 250 mL/hr over 120 Minutes Intravenous  Once 08/29/15 0936 08/29/15 1207   08/28/15 1845  cefTRIAXone (ROCEPHIN) 1 g in dextrose 5 % 50 mL IVPB  Status:  Discontinued     1 g 100 mL/hr over 30 Minutes Intravenous Every 24 hours 08/28/15 1835 08/29/15 0927   08/28/15 1845  azithromycin (ZITHROMAX) 500 mg in dextrose 5 % 250 mL IVPB  Status:  Discontinued     500 mg 250 mL/hr over 60 Minutes Intravenous Every 24 hours 08/28/15 1835 08/29/15 9233     Assessment: 70 year old man with known alcoholic cirrhosis who is noncompliant with medical treatment who presents to the hospital with a myriad of complaints as stated above. He states 2 weeks ago he fell in the shower and suffered a laceration on his left knee after which his knee has become quite swollen.  Blood cx positive for MRSA  He was on vancomycin but this was changed to ceftaroline due to worsening renal function.  His renal function is borderline for dose adjustment  Goal of Therapy:  Eradication of infection  Plan:  Cont ceftaroline 400 mg IV q12 hours Monitor labs, renal fxn  Thanks for allowing pharmacy to be a part of this patient's care.  Excell Seltzer, PharmD Clinical  Pharmacist   09/03/2015,9:26 AM

## 2015-09-04 LAB — CBC WITH DIFFERENTIAL/PLATELET
Basophils Absolute: 0.1 10*3/uL (ref 0.0–0.1)
Basophils Relative: 1 %
Eosinophils Absolute: 0.4 10*3/uL (ref 0.0–0.7)
Eosinophils Relative: 3 %
HEMATOCRIT: 25.4 % — AB (ref 39.0–52.0)
Hemoglobin: 8.9 g/dL — ABNORMAL LOW (ref 13.0–17.0)
LYMPHS PCT: 14 %
Lymphs Abs: 1.9 10*3/uL (ref 0.7–4.0)
MCH: 32.7 pg (ref 26.0–34.0)
MCHC: 35 g/dL (ref 30.0–36.0)
MCV: 93.4 fL (ref 78.0–100.0)
MONO ABS: 1.4 10*3/uL — AB (ref 0.1–1.0)
MONOS PCT: 10 %
NEUTROS ABS: 9.9 10*3/uL — AB (ref 1.7–7.7)
Neutrophils Relative %: 72 %
Platelets: 159 10*3/uL (ref 150–400)
RBC: 2.72 MIL/uL — ABNORMAL LOW (ref 4.22–5.81)
RDW: 16.7 % — AB (ref 11.5–15.5)
WBC: 13.6 10*3/uL — ABNORMAL HIGH (ref 4.0–10.5)

## 2015-09-04 LAB — AMMONIA: Ammonia: 39 umol/L — ABNORMAL HIGH (ref 9–35)

## 2015-09-04 MED ORDER — LACTULOSE 10 GM/15ML PO SOLN
10.0000 g | Freq: Two times a day (BID) | ORAL | Status: DC
  Administered 2015-09-04 – 2015-09-05 (×2): 10 g via ORAL
  Filled 2015-09-04 (×2): qty 30

## 2015-09-04 NOTE — Progress Notes (Signed)
This morning as well as 2-D echo transthoracic performed yesterday no clinical septicemia patient more alert undergoing physical therapy out of bed to chair Roberto [name] ZOX:[id number] DOB: 1945-09-05 DOA: 08/27/2015 PCP: Isabella Stalling, MD             Physical Exam: Blood pressure 113/54, pulse 70, temperature 98.2 F (36.8 C), temperature source Oral, resp. rate 18, height 6' (1.829 m), weight 212 lb (96.163 kg), SpO2 93 %. lungs clear to A&P no rales wheeze rhonchi appreciable heart regular rhythm no S3-S4 no heaves thrills rubs abdomen soft nontender bowel sounds normoactive right knee less swelling less rubor   Investigations:  Recent Results (from the past 240 hour(s))  Culture, blood (single)     Status: None   Collection Time: 08/28/15  5:30 PM  Result Value Ref Range Status   Specimen Description BLOOD RIGHT HAND  Final   Special Requests BOTTLES DRAWN AEROBIC AND ANAEROBIC 5CC EACH  Final   Culture  Setup Time   Final    GRAM POSITIVE COCCI IN CLUSTERS IN BOTH AEROBIC AND ANAEROBIC BOTTLES CRITICAL RESULT CALLED TO, READ BACK BY AND VERIFIED WITH: DICKERSON,M AT 8119 ON 08/29/2015 BY WOODS,M    Culture   Final    METHICILLIN RESISTANT STAPHYLOCOCCUS AUREUS Performed at [facility]    Report Status 08/31/2015 FINAL  Final   Organism ID, Bacteria METHICILLIN RESISTANT STAPHYLOCOCCUS AUREUS  Final      Susceptibility   Methicillin resistant staphylococcus aureus - MIC*    CIPROFLOXACIN >=8 RESISTANT Resistant     ERYTHROMYCIN 0.5 SENSITIVE Sensitive     GENTAMICIN <=0.5 SENSITIVE Sensitive     OXACILLIN >=4 RESISTANT Resistant     TETRACYCLINE <=1 SENSITIVE Sensitive     VANCOMYCIN 1 SENSITIVE Sensitive     TRIMETH/SULFA >=320 RESISTANT Resistant     CLINDAMYCIN <=0.25 SENSITIVE Sensitive     RIFAMPIN <=0.5 SENSITIVE Sensitive     Inducible Clindamycin NEGATIVE Sensitive     * METHICILLIN RESISTANT STAPHYLOCOCCUS AUREUS  Culture, blood (Routine X  2) w Reflex to ID Panel     Status: None (Preliminary result)   Collection Time: 09/01/15 12:20 PM  Result Value Ref Range Status   Specimen Description BLOOD RIGHT ARM  Final   Special Requests   Final    BOTTLES DRAWN AEROBIC AND ANAEROBIC 14CC EACH  IMMUNE:COMPROMISED   Culture NO GROWTH 2 DAYS  Final   Report Status PENDING  Incomplete  Culture, blood (Routine X 2) w Reflex to ID Panel     Status: None (Preliminary result)   Collection Time: 09/01/15 12:30 PM  Result Value Ref Range Status   Specimen Description BLOOD LEFT ARM  Final   Special Requests   Final    BOTTLES DRAWN AEROBIC AND ANAEROBIC 12CC EACH  IMMUNE:COMPROMISED   Culture NO GROWTH 2 DAYS  Final   Report Status PENDING  Incomplete     Basic Metabolic Panel:  Recent Labs  14/78/29 0544 09/03/15 0621  NA 128* 127*  K 3.7 4.7  CL 98* 98*  CO2 24 22  GLUCOSE 110* 132*  BUN 61* 72*  CREATININE 2.38* 2.62*  CALCIUM 7.1* 6.9*   Liver Function Tests: No results for input(s): AST, ALT, ALKPHOS, BILITOT, PROT, ALBUMIN in the last 72 hours.   CBC:  Recent Labs  09/03/15 0621 09/04/15 0618  WBC 16.0* 13.6*  NEUTROABS 12.3* 9.9*  HGB 9.3* 8.9*  HCT 26.9* 25.4*  MCV 93.1 93.4  PLT 135*  159    No results found.    Medications:   Impression: Staph bacteremia  Principal Problem:   Cirrhosis (HCC) Active Problems:   HTN (hypertension)   Alcohol withdrawal (HCC)   ETOH abuse     Plan: Continue antibiotic selection as per ID monitor renal function daily waiting results of transthoracic echo mobilize patient physical therapy protein supplement  Consultants: Orthopedic surgery cardiology infectious disease and gastroenterology   Procedures   Antibiotics:                   Code Status: Full  Family Communication:  Spoke with patient and brother  Disposition Plan see plan above  Time spent: 30 minutes   LOS: 7 days   Donise Woodle M   09/04/2015, 12:14 PM

## 2015-09-04 NOTE — Progress Notes (Signed)
  Subjective:  Patient denies abdominal pain. He complains of right shoulder pain. According to the sitter he ate 75% of breakfast and all of his lunch. He did have soft stool today. No melena or rectal bleeding reported.  Objective: Blood pressure 102/56, pulse 69, temperature 97.8 F (36.6 C), temperature source Oral, resp. rate 18, height 6' (1.829 m), weight 212 lb (96.163 kg), SpO2 95 %. Patient is alert  Abdomen is full but not distended anymore. Bowel sounds are normal. On palpation is soft and nontender without organomegaly or masses. He has 2+ pitting edema involving both legs.  Urine output 1650 mL last 24 hours.   Labs/studies Results:   Recent Labs  09/02/15 0544 09/03/15 0621 09/04/15 0618  WBC 15.6* 16.0* 13.6*  HGB 9.9* 9.3* 8.9*  HCT 29.2* 26.9* 25.4*  PLT 95* 135* 159    BMET   Recent Labs  09/02/15 0544 09/03/15 0621  NA 128* 127*  K 3.7 4.7  CL 98* 98*  CO2 24 22  GLUCOSE 110* 132*  BUN 61* 72*  CREATININE 2.38* 2.62*  CALCIUM 7.1* 6.9*     serum ammonia 39.  Assessment:  #1. Colonic ileus has resolved. Abdomen is not distended anymore. #2. 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  Lactulose was discontinued a few days ago because of adynamic colon. Now that colonic ileus has resolved will resume lactulose at low dose. #3. Staph aureus bacteremia. Source not identified. Patient has been switched to Ceftaroline because of worsening renal function. Serum creatinine not checked today. Echo results pending.  If this study is inconclusive and TEE as needed consider EGD prior to TEE to make sure he does not have large varices. He had grade 1 esophageal varices on EGD of May 2013 there is a good chance that varices have increased in size given that he has continued to drink alcohol. #4. Anemia. No evidence of overt GI bleed. Hemoglobin slowly drifting downwards. #5. Non-oliguric renal failure. Patient appears to be well-hydrated.  Urine output satisfactory. #6. Mild hyponatremia possibly dilutional.  Recommendations:  Decrease IV fluid rate to 50 mL per hour as patient is drinking plenty of fluids. Begin lactulose at a low dose of 10 g by mouth twice a day. Continue Xifaxan for now. Patient encouraged to use bedside commode. Metabolic 7 in a.m.

## 2015-09-04 NOTE — Plan of Care (Signed)
Problem: Safety: Goal: Ability to remain free from injury will improve Outcome: Progressing Safety sitter in place

## 2015-09-05 ENCOUNTER — Ambulatory Visit (HOSPITAL_COMMUNITY): Payer: Medicare Other

## 2015-09-05 ENCOUNTER — Inpatient Hospital Stay (HOSPITAL_COMMUNITY): Payer: Medicare Other

## 2015-09-05 LAB — BASIC METABOLIC PANEL
Anion gap: 6 (ref 5–15)
BUN: 62 mg/dL — AB (ref 6–20)
CO2: 20 mmol/L — ABNORMAL LOW (ref 22–32)
Calcium: 7 mg/dL — ABNORMAL LOW (ref 8.9–10.3)
Chloride: 103 mmol/L (ref 101–111)
Creatinine, Ser: 1.94 mg/dL — ABNORMAL HIGH (ref 0.61–1.24)
GFR calc Af Amer: 39 mL/min — ABNORMAL LOW (ref >60)
GFR calc non Af Amer: 34 mL/min — ABNORMAL LOW (ref >60)
Glucose, Bld: 103 mg/dL — ABNORMAL HIGH (ref 65–99)
POTASSIUM: 3.9 mmol/L (ref 3.5–5.1)
SODIUM: 129 mmol/L — AB (ref 135–145)

## 2015-09-05 LAB — CBC
HEMATOCRIT: 27.5 % — AB (ref 39.0–52.0)
HEMOGLOBIN: 9.3 g/dL — AB (ref 13.0–17.0)
MCH: 32.2 pg (ref 26.0–34.0)
MCHC: 33.8 g/dL (ref 30.0–36.0)
MCV: 95.2 fL (ref 78.0–100.0)
Platelets: 188 10*3/uL (ref 150–400)
RBC: 2.89 MIL/uL — AB (ref 4.22–5.81)
RDW: 17.8 % — ABNORMAL HIGH (ref 11.5–15.5)
WBC: 11.6 10*3/uL — AB (ref 4.0–10.5)

## 2015-09-05 LAB — VITAMIN B12: Vitamin B-12: 2114 pg/mL — ABNORMAL HIGH (ref 180–914)

## 2015-09-05 LAB — FOLATE: Folate: 36.5 ng/mL (ref >5.9)

## 2015-09-05 LAB — SEDIMENTATION RATE: Sed Rate: 126 mm/hr — ABNORMAL HIGH (ref 0–16)

## 2015-09-05 LAB — TSH: TSH: 2.535 u[IU]/mL (ref 0.350–4.500)

## 2015-09-05 NOTE — Progress Notes (Signed)
Informed about the right leg weakness this a.m. The patient has been ordered to be out of bed to chair 3 times a day for 48 hours now along with physical therapy. No reports of leg weakness with physical therapy patient has sensory 4-5 in right lower extremity including heels foot and ankle motor strength in right leg is 1 over 5 this may be partially due to guarding of the right knee due to pain not certain [name] is alert and oriented consideration given to epidural abscess of lumbosacral spine versus CVA or both given MRSA bacteremia on antibiotics for 1 week currently the plan is to perform MRI of lumbosacral spine as well as head to rule out any space-occupying lesions or infarcts possibly responsible for this right leg weakness will make further recommendations as the database expands Roberto [name] [id number] DOB: [birth year] DOA: 08/27/2015 PCP: Roberto Stalling, MD             Physical Exam: Blood pressure 105/61, pulse 57, temperature 97.4 F (36.3 C), temperature source Oral, resp. rate 20, height 6' (1.829 m), weight 212 lb (96.163 kg), SpO2 95 %.lungs clear to A&P no rales wheeze rhonchi heart regular rhythm no murmurs goes heaves thrills rubs abdomen soft nontender bowel sounds normoactive right leg sensory intact throughout entire leg plantars downgoing bilaterally motor strength is one out of 5 right lower extremity 3 out of 5 left lower extremity possibly due to guarding not certain of any nursing Center grossly intact arms 4-5 motor strength bilaterally   Investigations:  Recent Results (from the past 240 hour(s))  Culture, blood (single)     Status: None   Collection Time: 08/28/15  5:30 PM  Result Value Ref Range Status   Specimen Description BLOOD RIGHT HAND  Final   Special Requests BOTTLES DRAWN AEROBIC AND ANAEROBIC 5CC EACH  Final   Culture  Setup Time   Final    GRAM POSITIVE COCCI IN CLUSTERS IN BOTH AEROBIC AND ANAEROBIC BOTTLES CRITICAL RESULT CALLED TO,  READ BACK BY AND VERIFIED WITH: DICKERSON,M AT 8119 ON 08/29/2015 BY WOODS,M    Culture   Final    METHICILLIN RESISTANT STAPHYLOCOCCUS AUREUS Performed at [facility]    Report Status 08/31/2015 FINAL  Final   Organism ID, Bacteria METHICILLIN RESISTANT STAPHYLOCOCCUS AUREUS  Final      Susceptibility   Methicillin resistant staphylococcus aureus - MIC*    CIPROFLOXACIN >=8 RESISTANT Resistant     ERYTHROMYCIN 0.5 SENSITIVE Sensitive     GENTAMICIN <=0.5 SENSITIVE Sensitive     OXACILLIN >=4 RESISTANT Resistant     TETRACYCLINE <=1 SENSITIVE Sensitive     VANCOMYCIN 1 SENSITIVE Sensitive     TRIMETH/SULFA >=320 RESISTANT Resistant     CLINDAMYCIN <=0.25 SENSITIVE Sensitive     RIFAMPIN <=0.5 SENSITIVE Sensitive     Inducible Clindamycin NEGATIVE Sensitive     * METHICILLIN RESISTANT STAPHYLOCOCCUS AUREUS  Culture, blood (Routine X 2) w Reflex to ID Panel     Status: None (Preliminary result)   Collection Time: 09/01/15 12:20 PM  Result Value Ref Range Status   Specimen Description BLOOD RIGHT ARM  Final   Special Requests   Final    BOTTLES DRAWN AEROBIC AND ANAEROBIC 14CC EACH  IMMUNE:COMPROMISED   Culture NO GROWTH 4 DAYS  Final   Report Status PENDING  Incomplete  Culture, blood (Routine X 2) w Reflex to ID Panel     Status: None (Preliminary result)   Collection Time: 09/01/15  12:30 PM  Result Value Ref Range Status   Specimen Description BLOOD LEFT ARM  Final   Special Requests   Final    BOTTLES DRAWN AEROBIC AND ANAEROBIC 12CC EACH  IMMUNE:COMPROMISED   Culture NO GROWTH 4 DAYS  Final   Report Status PENDING  Incomplete     Basic Metabolic Panel:  Recent Labs  57/84/69 0621 09/05/15 0702  NA 127* 129*  K 4.7 3.9  CL 98* 103  CO2 22 20*  GLUCOSE 132* 103*  BUN 72* 62*  CREATININE 2.62* 1.94*  CALCIUM 6.9* 7.0*   Liver Function Tests: No results for input(s): AST, ALT, ALKPHOS, BILITOT, PROT, ALBUMIN in the last 72 hours.   CBC:  Recent Labs   09/03/15 0621 09/04/15 0618  WBC 16.0* 13.6*  NEUTROABS 12.3* 9.9*  HGB 9.3* 8.9*  HCT 26.9* 25.4*  MCV 93.1 93.4  PLT 135* 159    No results found.    Medications:I.V. Teflaro.  Impression: Right lower Roberto [name] any motor weakness  Principal Problem:   Cirrhosis (HCC) Active Problems:   HTN (hypertension)   Alcohol withdrawal (HCC)   ETOH abuse     Plan:Obtain stat MRIs of lumbosacral spine to rule out epidural abscess by MRI of brain to rule out any cerebrovascular accident due to embolization of bacteremia continue intravenous teflaro  Consultants: Orthopedic surgery, infectious disease, gastroenterology,  Procedures   Antibiotics:                   Code Status:  Family Communication:  Discussed with patient at length  Disposition Plan C plan above  Time spent:35 minutes   LOS: 8 days   Roberto [name] M   09/05/2015, 12:46 PM

## 2015-09-05 NOTE — Progress Notes (Addendum)
Spoke with Dr Janna Arch via telephone, ordered MRI needed to be done STAT.  Transport via [facility] set up due to MRI needing to be done STAT.  Pt will be transported to [facility] for MRI and return when complete.  Family notified. Will continue to monitor.

## 2015-09-05 NOTE — Progress Notes (Signed)
  Subjective:  Patient denies nausea vomiting or abdominal pain. According to the sitter he ate at least 50% of his breakfast. Patient has been complaining of lower extremity weakness since this morning.  According to the sitter he had soft mushy stool this morning.  Dr. Delbert Harness has evaluated patient and MRI of brain and lumbosacral region planned.   Objective: Blood pressure 105/61, pulse 57, temperature 97.4 F (36.3 C), temperature source Oral, resp. rate 20, height 6' (1.829 m), weight 212 lb (96.163 kg), SpO2 95 %. Patient is alert and answers questions appropriately. Abdomen is more distended than yesterday but it is not tense or tender. Bowel sounds are present. He has bilateral pitting edema 3+ on the right side and 2+ on the left site.   Labs/studies Results:   Recent Labs  09/03/15 0621 09/04/15 0618  WBC 16.0* 13.6*  HGB 9.3* 8.9*  HCT 26.9* 25.4*  PLT 135* 159    BMET   Recent Labs  09/03/15 0621 09/05/15 0702  NA 127* 129*  K 4.7 3.9  CL 98* 103  CO2 22 20*  GLUCOSE 132* 103*  BUN 72* 62*  CREATININE 2.62* 1.94*  CALCIUM 6.9* 7.0*       Assessment:  #1. Abdominal distention secondary to colonic ileus. He has multiple risks. His abdominal exam yesterday was normal but it is mildly distended today. #2. Anemia. H&H is low but no evidence of active bleeding #3. Mild hyponatremia possibly dilutional. #4. AKI possibly secondary to vancomycin. Renal function is improving. #5. Staph aureus sepsis. Patient is afebrile. Echo was not possible. Will discuss with Dr. Simona Huh if he feels TEE is warranted in which case he could have EGD prior to TEE. He will need propofol for this procedure if it has to be done. #6. 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 Clinically he does not appear to be encephalopathic. #7. No extremity weakness. Abdominopelvic CT 4 days ago revealed recommendation of lower T8 and associated paraspinal soft tissue  swelling. It also showed chronic T12 vertebral compression. Remains to be seen if this abnormality has anything to do with this weakness. Study also revealed mild compression fracture to L3.  Recommendations:  Discontinue lactulose as it may be contribution to abdominal distention.

## 2015-09-05 NOTE — Progress Notes (Signed)
      [facility]imicrobial Management Team Staphylococcus aureus bacteremia   Staphylococcus aureus bacteremia (SAB) is associated with a high rate of complications and mortality.  Specific aspects of clinical management are critical to optimizing the outcome of patients with SAB.  Therefore, the [facility] Antimicrobial Management Team [facility]) has initiated an intervention aimed at improving the management of SAB at [facility].  To do so, Infectious Diseases physicians are providing an evidence-based consult for the management of all patients with SAB.     Yes No Comments  Perform follow-up blood cultures (even if the patient is afebrile) to ensure clearance of bacteremia   BLOOD CULTURES DRAWN 09/01/15 NGTD  Remove vascular catheter and obtain follow-up blood cultures after the removal of the catheter   DO NOT PLACE PICC OR CENTRAL LINE UNTIL WE HAVE CLEARANCE OF BLOOD CULTURES  Perform echocardiography to evaluate for endocarditis (transthoracic ECHO is 40-50% sensitive, TEE is > 90% sensitive)   Please keep in mind, that neither test can definitively EXCLUDE endocarditis, and that should clinical suspicion remain high for endocarditis the patient should then still be treated with an "endocarditis" duration of therapy = 6 weeks  TTE limited,  I would NOT pursue TEE at this point     Consult electrophysiologist to evaluate implanted cardiac device (pacemaker, ICD)   NOT WITH AICD, PM TO MY KNOWLEDGE  Ensure source control   Have all abscesses been drained effectively? Have deep seeded infections (septic joints or osteomyelitis) had appropriate surgical debridement?  NOT CLEAR WHAT SOURCE IS THE LUNGS OR HER KNEE WHICH WAS THOUGHT TO BE POSSIBLY INFECTED  SHE HAD ASPIRATE OF THE KNEE BUT NO  CELL COUNT AND DIFFERENTIAL OR CULTURE FROM THE KNEE because Orthopedics felt it was not infected based on visual inspection and knee is reportedly better now       Investigate  for "metastatic" sites of infection   Does the patient have ANY symptom or physical exam finding that would suggest a deeper infection (back or neck pain that may be suggestive of vertebral osteomyelitis or epidural abscess, muscle pain that could be a symptom of pyomyositis)?  Keep in mind that for deep seeded infections MRI imaging with contrast is preferred rather than other often insensitive tests such as plain x-rays, especially early in a patient's presentation.  I am disturbed by new finding of weakness on exam and agree with MRI L spine and MRI brain.    Change antibiotic therapy to [facility] given worsening renal failure but desire to use drug that is active in the lungs   Beta-lactam antibiotics are preferred for MSSA due to higher cure rates.   If on Vancomycin, goal trough should be 15 - 20 mcg/mL  Estimated duration of IV antibiotic therapy:  6-8 WEEKS   Consult case management for probably prolonged outpatient IV antibiotic therapy   I would NOT PUSH HARD FOR TEE and would instead plan to treat the patient with 6 if not 8 weeks of parenteral therapy    I will arrange for followup in the next 4 weeks with our group

## 2015-09-05 NOTE — Progress Notes (Signed)
MRI of lumbosacral spine to rule out epidural abscess and MRI of head to rule out any CNS lesion ordered 6 hours ago came back in to check on results and they were not done at this point I'm just informed that MRI was not performed here on the weekends we are now following them at [facility] both of the head and lumbosacral spine in the event that an epidural abscess is causing any leg weakness . Roberto [name] UJW:[id number] DOB: 10-08-1945 DOA: 08/27/2015 PCP: Isabella Stalling, MD             Physical Exam: Blood pressure 109/62, pulse 64, temperature 97.6 F (36.4 C), temperature source Oral, resp. rate 18, height 6' (1.829 m), weight 212 lb (96.163 kg), SpO2 98 %.patient moves both lower extremities minimally he has guarding due to pain of right knee and right hip free range of motion of right hip ostomy to bend right knee plan is downgoing bilaterally sensory eyes for 5 both lower extremities strength is one to 2 over 5 in right lower extremity and 2-3 over 5 in left lower extremity not certain if this is due to guarding due to pain or splinting cranial nerves are grossly intact patient is alert and oriented in all spheres anterior motor both upper extremities is 4-5 equal and symmetrical   Investigations:  Recent Results (from the past 240 hour(s))  Culture, blood (single)     Status: None   Collection Time: 08/28/15  5:30 PM  Result Value Ref Range Status   Specimen Description BLOOD RIGHT HAND  Final   Special Requests BOTTLES DRAWN AEROBIC AND ANAEROBIC 5CC EACH  Final   Culture  Setup Time   Final    GRAM POSITIVE COCCI IN CLUSTERS IN BOTH AEROBIC AND ANAEROBIC BOTTLES CRITICAL RESULT CALLED TO, READ BACK BY AND VERIFIED WITH: DICKERSON,M AT 5621 ON 08/29/2015 BY WOODS,M    Culture   Final    METHICILLIN RESISTANT STAPHYLOCOCCUS AUREUS Performed at [facility]    Report Status 08/31/2015 FINAL  Final   Organism ID, Bacteria METHICILLIN RESISTANT  STAPHYLOCOCCUS AUREUS  Final      Susceptibility   Methicillin resistant staphylococcus aureus - MIC*    CIPROFLOXACIN >=8 RESISTANT Resistant     ERYTHROMYCIN 0.5 SENSITIVE Sensitive     GENTAMICIN <=0.5 SENSITIVE Sensitive     OXACILLIN >=4 RESISTANT Resistant     TETRACYCLINE <=1 SENSITIVE Sensitive     VANCOMYCIN 1 SENSITIVE Sensitive     TRIMETH/SULFA >=320 RESISTANT Resistant     CLINDAMYCIN <=0.25 SENSITIVE Sensitive     RIFAMPIN <=0.5 SENSITIVE Sensitive     Inducible Clindamycin NEGATIVE Sensitive     * METHICILLIN RESISTANT STAPHYLOCOCCUS AUREUS  Culture, blood (Routine X 2) w Reflex to ID Panel     Status: None (Preliminary result)   Collection Time: 09/01/15 12:20 PM  Result Value Ref Range Status   Specimen Description BLOOD RIGHT ARM  Final   Special Requests   Final    BOTTLES DRAWN AEROBIC AND ANAEROBIC 14CC EACH  IMMUNE:COMPROMISED   Culture NO GROWTH 4 DAYS  Final   Report Status PENDING  Incomplete  Culture, blood (Routine X 2) w Reflex to ID Panel     Status: None (Preliminary result)   Collection Time: 09/01/15 12:30 PM  Result Value Ref Range Status   Specimen Description BLOOD LEFT ARM  Final   Special Requests   Final    BOTTLES DRAWN AEROBIC AND ANAEROBIC 12CC  EACH  IMMUNE:COMPROMISED   Culture NO GROWTH 4 DAYS  Final   Report Status PENDING  Incomplete     Basic Metabolic Panel:  Recent Labs  91/47/82 0621 09/05/15 0702  NA 127* 129*  K 4.7 3.9  CL 98* 103  CO2 22 20*  GLUCOSE 132* 103*  BUN 72* 62*  CREATININE 2.62* 1.94*  CALCIUM 6.9* 7.0*   Liver Function Tests: No results for input(s): AST, ALT, ALKPHOS, BILITOT, PROT, ALBUMIN in the last 72 hours.   CBC:  Recent Labs  09/03/15 0621 09/04/15 0618 09/05/15 1351  WBC 16.0* 13.6* 11.6*  NEUTROABS 12.3* 9.9*  --   HGB 9.3* 8.9* 9.3*  HCT 26.9* 25.4* 27.5*  MCV 93.1 93.4 95.2  PLT 135* 159 188    No results found.    Medications:   Impression:  Principal Problem:    Cirrhosis (HCC) Active Problems:   HTN (hypertension)   Alcohol withdrawal (HCC)   ETOH abuse     Plan:stat MRI of the CNS and stat MRI of lumbosacral  [place] to be certain there is no epidural abscess caused by metastasis of bacteremia.  Consultants: Infectious disease, cardiology, gastroenterology, orthopedic surgery   Procedures   Antibiotics: IV Teflaro                  Code Status:  Family Communication:  Discussed with patient at length risk benefit ratio MRIs at [facility] today he comprehends  Disposition Plan C plan above  Time spent:30 minutes   LOS: 8 days   Kerron Sedano M   09/05/2015, 7:54 PM

## 2015-09-06 LAB — HIV ANTIBODY (ROUTINE TESTING W REFLEX): HIV SCREEN 4TH GENERATION: NONREACTIVE

## 2015-09-06 LAB — CULTURE, BLOOD (ROUTINE X 2)
CULTURE: NO GROWTH
Culture: NO GROWTH

## 2015-09-06 LAB — CBC WITH DIFFERENTIAL/PLATELET
Basophils Absolute: 0.1 10*3/uL (ref 0.0–0.1)
Basophils Relative: 1 %
EOS ABS: 0.2 10*3/uL (ref 0.0–0.7)
EOS PCT: 2 %
HCT: 26.7 % — ABNORMAL LOW (ref 39.0–52.0)
Hemoglobin: 9 g/dL — ABNORMAL LOW (ref 13.0–17.0)
LYMPHS ABS: 1.8 10*3/uL (ref 0.7–4.0)
Lymphocytes Relative: 15 %
MCH: 32.1 pg (ref 26.0–34.0)
MCHC: 33.7 g/dL (ref 30.0–36.0)
MCV: 95.4 fL (ref 78.0–100.0)
MONO ABS: 1.1 10*3/uL — AB (ref 0.1–1.0)
Monocytes Relative: 10 %
Neutro Abs: 8.3 10*3/uL — ABNORMAL HIGH (ref 1.7–7.7)
Neutrophils Relative %: 72 %
PLATELETS: 221 10*3/uL (ref 150–400)
RBC: 2.8 MIL/uL — AB (ref 4.22–5.81)
RDW: 18.2 % — AB (ref 11.5–15.5)
WBC: 11.5 10*3/uL — AB (ref 4.0–10.5)

## 2015-09-06 LAB — BASIC METABOLIC PANEL
Anion gap: 5 (ref 5–15)
BUN: 57 mg/dL — AB (ref 6–20)
CHLORIDE: 105 mmol/L (ref 101–111)
CO2: 21 mmol/L — ABNORMAL LOW (ref 22–32)
CREATININE: 1.65 mg/dL — AB (ref 0.61–1.24)
Calcium: 7.2 mg/dL — ABNORMAL LOW (ref 8.9–10.3)
GFR calc Af Amer: 48 mL/min — ABNORMAL LOW (ref >60)
GFR calc non Af Amer: 41 mL/min — ABNORMAL LOW (ref >60)
Glucose, Bld: 107 mg/dL — ABNORMAL HIGH (ref 65–99)
Potassium: 4.3 mmol/L (ref 3.5–5.1)
SODIUM: 131 mmol/L — AB (ref 135–145)

## 2015-09-06 LAB — RPR: RPR Ser Ql: NONREACTIVE

## 2015-09-06 MED ORDER — VITAMIN B-1 100 MG PO TABS
100.0000 mg | ORAL_TABLET | Freq: Every day | ORAL | Status: DC
  Administered 2015-09-06 – 2015-09-07 (×2): 100 mg via ORAL
  Filled 2015-09-06 (×2): qty 1

## 2015-09-06 NOTE — NC FL2 (Signed)
[place] MEDICAID FL2 LEVEL OF CARE SCREENING TOOL     IDENTIFICATION  Patient Name: Roberto [name] Birthdate: Aug 14, 1945 Sex: male Admission Date (Current Location): 08/27/2015  [facility] and [place] Number:  [employer] and Address:  [facility],  [address], [place] [address]      Provider Number: [id number]  Attending Physician Name and Address:  Oval Linsey, MD  Relative Name and Phone Number:       Current Level of Care: Hospital Recommended Level of Care: Skilled Nursing Facility Prior Approval Number:    Date Approved/Denied:   PASRR Number:  [id number])  Discharge Plan: SNF    Current Diagnoses: Patient Active Problem List   Diagnosis Date Noted  . ETOH abuse 08/27/2015  . Alcohol dependence with uncomplicated withdrawal (HCC)   . 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 Alcohol dependence (HCC) 06/02/2015  . Acute encephalopathy 09/20/2014  . Alcoholic ketoacidosis 09/20/2014  . Delirium tremens (HCC) 09/19/2014  . Hypokalemia 05/20/2012  . Acute respiratory failure (HCC) 05/19/2012  . Severe protein-calorie malnutrition (HCC) 05/19/2012  . Sepsis (HCC) 05/19/2012  . Alcohol withdrawal (HCC) 05/18/2012  . Encephalopathy 05/18/2012  . Dyspnea 05/18/2012  . Ascites 05/18/2012  . Volume overload 05/18/2012  . Helicobacter pylori antibody positive 12/19/2011  . Adynamic ileus (HCC) 12/18/2011  . 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 Hx of substance abuse 12/17/2011  . Cirrhosis (HCC) 12/16/2011  . Abdominal pain, diffuse 12/15/2011  . Thrombocytopenia (HCC) 12/15/2011  . Duodenal ulcer with hemorrhage 12/15/2011  . Esophageal varices without bleeding (HCC) 12/15/2011  . Hepatic steatosis 12/14/2011  . Compression fracture of L1 lumbar vertebra (HCC) 12/14/2011  . Hypocalcemia 12/14/2011  . Acute renal failure (HCC) 12/14/2011  .  Hypernatremia 12/14/2011  . Hypotension 12/14/2011  . Hyperglycemia 12/14/2011  . Alcoholic hepatitis 12/14/2011  . GI bleed 12/13/2011  . Acute blood loss anemia 12/13/2011  . Alcohol abuse 12/13/2011  . Jaundice 12/13/2011  . Coagulopathy (HCC) 12/13/2011  . Macrocytic anemia 12/13/2011  . Renal insufficiency 12/13/2011  . Hepatic encephalopathy (HCC) 12/13/2011  . UTI (urinary tract infection) 12/13/2011  . Debility 12/13/2011  . Frequent falls 12/13/2011  . Pneumonia 08/17/2011  . HTN (hypertension) 08/17/2011  . COPD (chronic obstructive pulmonary disease) (HCC) 08/17/2011  . Smoker, current status unknown 08/17/2011    Orientation RESPIRATION BLADDER Height & Weight     Self, Situation, Place (Patient stated it was Jan. 2004. He did know that Donal Trump is the current sitting President. )  Normal Incontinent Weight: 212 lb (96.163 kg) Height:  6' (182.9 cm)  BEHAVIORAL SYMPTOMS/MOOD NEUROLOGICAL BOWEL NUTRITION STATUS      Continent Diet (Diet Heart Healthy)  AMBULATORY STATUS COMMUNICATION OF NEEDS Skin   Extensive Assist   Normal                       Personal Care Assistance Level of Assistance  Bathing, Dressing Bathing Assistance: Limited assistance   Dressing Assistance: Limited assistance     Functional Limitations Info             SPECIAL CARE FACTORS FREQUENCY  PT (By licensed PT)     PT Frequency:  (5x/week)              Contractures      Additional Factors Info  Isolation Precautions         Isolation Precautions Info:  (08/28/15  Blood Culture positive for MRSA. Contact Precautions)     Current Medications (09/06/2015):  This is the current hospital active medication list Current Facility-Administered Medications  Medication Dose Route Frequency Provider Last Rate Last Dose  . 0.9 %  sodium chloride infusion  250 mL Intravenous PRN Henderson Cloud, MD      . 0.9 %  sodium chloride infusion   Intravenous Continuous  Malissa Hippo, MD 50 mL/hr at 09/05/15 1811    . acetaminophen (TYLENOL) tablet 650 mg  650 mg Oral Q6H PRN Avon Gully, MD   650 mg at 08/31/15 1826  . albuterol (PROVENTIL) (2.5 MG/3ML) 0.083% nebulizer solution 2.5 mg  2.5 mg Nebulization Q6H PRN Henderson Cloud, MD   2.5 mg at 08/30/15 1007  . ceftaroline (TEFLARO) 400 mg in sodium chloride 0.9 % 250 mL IVPB  400 mg Intravenous Q12H Oval Linsey, MD   400 mg at 09/06/15 1021  . feeding supplement (ENSURE ENLIVE) (ENSURE ENLIVE) liquid 237 mL  237 mL Oral BID BM Oval Linsey, MD   237 mL at 09/06/15 1334  . folic acid (FOLVITE) tablet 1 mg  1 mg Oral Daily Estela Isaiah Blakes, MD   1 mg at 09/06/15 1020  . LORazepam (ATIVAN) tablet 0.5 mg  0.5 mg Oral Q6H PRN Oval Linsey, MD   0.5 mg at 09/05/15 0856  . multivitamin with minerals tablet 1 tablet  1 tablet Oral Daily Henderson Cloud, MD   1 tablet at 09/06/15 1020  . ondansetron (ZOFRAN) tablet 4 mg  4 mg Oral Q6H PRN Henderson Cloud, MD       Or  . ondansetron [facility]) injection 4 mg  4 mg Intravenous Q6H PRN Henderson Cloud, MD      . oxyCODONE (Oxy IR/ROXICODONE) immediate release tablet 5 mg  5 mg Oral Q4H PRN Oval Linsey, MD   5 mg at 09/06/15 0807  . pantoprazole (PROTONIX) EC tablet 40 mg  40 mg Oral Daily Henderson Cloud, MD   40 mg at 09/06/15 1020  . polyethylene glycol (MIRALAX / GLYCOLAX) packet 17 g  17 g Oral Daily Malissa Hippo, MD   17 g at 09/06/15 1021  . polyvinyl alcohol (LIQUIFILM TEARS) 1.4 % ophthalmic solution 1 drop  1 drop Both Eyes PRN Oval Linsey, MD      . rifaximin [facility]) tablet 550 mg  550 mg Oral BID Malissa Hippo, MD   550 mg at 09/06/15 0800  . senna-docusate (Senokot-S) tablet 1 tablet  1 tablet Oral QHS PRN Henderson Cloud, MD      . sodium chloride flush (NS) 0.9 % injection 3 mL  3 mL Intravenous Q12H Estela Isaiah Blakes, MD   3 mL at 09/04/15 1000  .  sodium chloride flush (NS) 0.9 % injection 3 mL  3 mL Intravenous Q12H Estela Isaiah Blakes, MD   3 mL at 09/05/15 0857  . sodium chloride flush (NS) 0.9 % injection 3 mL  3 mL Intravenous PRN Henderson Cloud, MD         Discharge Medications: Please see discharge summary for a list of discharge medications.  Relevant Imaging Results:  Relevant Lab Results:   Additional Information    Kushal Saunders, Juleen China, LCSW

## 2015-09-06 NOTE — Clinical Social Work Note (Signed)
Clinical Social Work Assessment  Patient Details  Name: Roberto [name] MRN: [id number] Date of Birth: 07/03/1946  Date of referral:  09/06/15               Reason for consult:  Facility Placement                Permission sought to share information with:    Permission granted to share information::     Name::        Agency::     Relationship::     Contact Information:     Housing/Transportation Living arrangements for the past 2 months:  Single Family Home Source of Information:  Patient Patient Interpreter Needed:  None Criminal Activity/Legal Involvement Pertinent to Current Situation/Hospitalization:  No - Comment as needed Significant Relationships:  Adult Children Lives with:  Self Do you feel safe going back to the place where you live?  Yes Need for family participation in patient care:  Yes (Comment)  Care giving concerns: None identified.    Social Worker assessment / plan:  Patient advised that he lives alone, ambulates unassisted, drives and does not receive assistance with ADLs.  CSW stated that he was aware of SNF recommendation and was accepting.  Patient advised that he wanted to go to [facility] because his mother is a resident and due to not having transportation, he would be able to visit his mother more frequently if he completed rehab at [facility]. Patient stated that he has two adult sons who will assist him as he needs it. Patient became tearful and stated that he felt helpless. CSW provided supportive counseling and encouragement.   Employment status:  Retired Health and safety inspector:  Medicare PT Recommendations:  Skilled Nursing Facility Information / Referral to community resources:  Skilled Nursing Facility  Patient/Family's Response to care:  Patient is agreeable to go to SNF for rehab.  Patient/Family's Understanding of and Emotional Response to Diagnosis, Current Treatment, and Prognosis: Patient understands his diagnosis, treatment and prognosis.     Emotional Assessment Appearance:  Appears stated age Attitude/Demeanor/Rapport:  Crying Affect (typically observed):  Sad Orientation:  Oriented to Self, Oriented to Place, Oriented to Situation (Patient stated that the time was Jan. 2004, however he knew Garnet Koyanagi was president. ) Alcohol / Substance use:  Not Applicable Psych involvement (Current and /or in the community):  No (Comment)  Discharge Needs  Concerns to be addressed:  Discharge Planning Concerns Readmission within the last 30 days:  No Current discharge risk:  None Barriers to Discharge:  No Barriers Identified   Annice Needy, LCSW 09/06/2015, 1:19 PM

## 2015-09-06 NOTE — Progress Notes (Signed)
Pharmacy Antibiotic Note  Roberto [name] is a 70 y.o. male admitted on 08/27/2015 with bacteremia.  Pharmacy has been consulted for ceftaroline dosing. He will need a total of 6-8 weeks antibiotic therapy. His renal function has improved.  Plan: Cont ceftaroline 400 mg IV q12 hours  Height: 6' (182.9 cm) Weight: 212 lb (96.163 kg) IBW/kg (Calculated) : 77.6  Temp (24hrs), Avg:98.3 F (36.8 C), Min:97.6 F (36.4 C), Max:98.6 F (37 C)   Recent Labs Lab 08/31/15 1459 09/01/15 0533 09/01/15 0858 09/01/15 1441 09/02/15 0544 09/03/15 0621 09/04/15 0618 09/05/15 0702 09/05/15 1351 09/06/15 0547  WBC  --  13.3*  --   --  15.6* 16.0* 13.6*  --  11.6* 11.5*  CREATININE  --  1.86*  --   --  2.38* 2.62*  --  1.94*  --  1.65*  LATICACIDVEN 1.5  --   --  1.5  --   --   --   --   --   --   VANCOTROUGH  --   --  26*  --   --   --   --   --   --   --     Estimated Creatinine Clearance: 52.2 mL/min (by C-G formula based on Cr of 1.65).    No Known Allergies  Antimicrobials this admission: Vancomycin  1/29 >> 2/1 Zosyn  1/29 >> 2/1 Ceftaroline 2/2>>present   Thank you for allowing pharmacy to be a part of this patient's care.  Talbert Cage Poteet 09/06/2015 9:07 AM

## 2015-09-06 NOTE — Progress Notes (Signed)
  Subjective:  Patient denies abdominal pain. He states he had upper bowel movements yesterday. He denies melena or rectal bleeding. He continues to complain of lower extremity weakness.  Objective: Blood pressure 116/64, pulse 72, temperature 98.6 F (37 C), temperature source Oral, resp. rate 18, height 6' (1.829 m), weight 212 lb (96.163 kg), SpO2 95 %. Patient is alert and in no acute distress. Abdomen is distended with hyperactive bowel sounds. On palpation is tense but not tender. He has 3+ edema involving right leg and 2+ on the left leg.  Urine output 1775 mL last 24 hours.  Labs/studies Results:   Recent Labs  09/04/15 0618 09/05/15 1351 09/06/15 0547  WBC 13.6* 11.6* 11.5*  HGB 8.9* 9.3* 9.0*  HCT 25.4* 27.5* 26.7*  PLT 159 188 221    BMET   Recent Labs  09/05/15 0702 09/06/15 0547  NA 129* 131*  K 3.9 4.3  CL 103 105  CO2 20* 21*  GLUCOSE 103* 107*  BUN 62* 57*  CREATININE 1.94* 1.65*  CALCIUM 7.0* 7.2*    Sed rate 126  Assessment:  #1. Abdominal distention secondary to adynamic colon. Abdominal exam has not changed in the last 24 hours. His bowels are moving. Unfortunately risks have not changed. He is virtually immobile and remains on pain medication.  #2. Anemia. H&H is low but no evidence of active bleeding.  #3. Hyponatremia is improving. Etiology felt to be dilutional. Serum sodium is 131 today.  #4. AKI possibly secondary to vancomycin. Renal function continues to improve. Serum creatinine now is 1.65. Serum creatinine peaked to 2.6-3 days ago. Serum creatinine was normal on admission.  #5. Staph aureus sepsis. Patient is afebrile. Follow-up blood cultures are negative. Patient to continue antibiotic for total of 6 weeks hence endocarditis cannot be ruled out. ECHO suboptimal and patient high-risk for TEE. Sedimentation rate is 126 possibly secondary to infection.  #6. 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 Clinically  he does not appear to be encephalopathic.  #7. Lower extremity weakness. MRI of lumbar spine and brain revealed multiple chronic findings but no abscess. New pending.  Recommendations:  If abdominal distention becomes worse will consider rectal tube placement. Patient encouraged to turn in bed every 2-3 hours.

## 2015-09-06 NOTE — Progress Notes (Signed)
MRI performed last night reveals no evidence of cranial infarct or mass to explain leg weakness likewise MRI of LS-spine reveals L3 compression  Fracture with spinal stenosis from L2-3-4 5 and for mental stenosis of moderate degree which may or may not be related to the leg weakness patient is currently infected and is not candidate for surgery intervention at present we'll manage conservatively to antibiotics clear the blood stream for 6 weeks continue on current course with physical therapy and mobilization of patient Roberto [name] ZOX:[id number] DOB: 1945-12-04 DOA: 08/27/2015 PCP: Isabella Stalling, MD             Physical Exam: Blood pressure 106/57, pulse 73, temperature 98.5 F (36.9 C), temperature source Oral, resp. rate 18, height 6' (1.829 m), weight 212 lb (96.163 kg), SpO2 95 %. lungs clear to A&P no rales wheeze rhonchi heart regular rhythm no S3 or S4 no heaves rubs abdomen soft nontender bowel sounds normoactive patient with both lower extremities heart 1-2 over 5 motor strength bilaterally heart regular rhythm no S3 or S4 auscultated no heaves thrills rubs neurologic cranial nerves grossly intact patient moves all both upper extremities normally sensory intact both lower extremities   Investigations:  Recent Results (from the past 240 hour(s))  Culture, blood (single)     Status: None   Collection Time: 08/28/15  5:30 PM  Result Value Ref Range Status   Specimen Description BLOOD RIGHT HAND  Final   Special Requests BOTTLES DRAWN AEROBIC AND ANAEROBIC 5CC EACH  Final   Culture  Setup Time   Final    GRAM POSITIVE COCCI IN CLUSTERS IN BOTH AEROBIC AND ANAEROBIC BOTTLES CRITICAL RESULT CALLED TO, READ BACK BY AND VERIFIED WITH: DICKERSON,M AT 8119 ON 08/29/2015 BY WOODS,M    Culture   Final    METHICILLIN RESISTANT STAPHYLOCOCCUS AUREUS Performed at [facility]    Report Status 08/31/2015 FINAL  Final   Organism ID, Bacteria METHICILLIN RESISTANT  STAPHYLOCOCCUS AUREUS  Final      Susceptibility   Methicillin resistant staphylococcus aureus - MIC*    CIPROFLOXACIN >=8 RESISTANT Resistant     ERYTHROMYCIN 0.5 SENSITIVE Sensitive     GENTAMICIN <=0.5 SENSITIVE Sensitive     OXACILLIN >=4 RESISTANT Resistant     TETRACYCLINE <=1 SENSITIVE Sensitive     VANCOMYCIN 1 SENSITIVE Sensitive     TRIMETH/SULFA >=320 RESISTANT Resistant     CLINDAMYCIN <=0.25 SENSITIVE Sensitive     RIFAMPIN <=0.5 SENSITIVE Sensitive     Inducible Clindamycin NEGATIVE Sensitive     * METHICILLIN RESISTANT STAPHYLOCOCCUS AUREUS  Culture, blood (Routine X 2) w Reflex to ID Panel     Status: None (Preliminary result)   Collection Time: 09/01/15 12:20 PM  Result Value Ref Range Status   Specimen Description BLOOD RIGHT ARM  Final   Special Requests   Final    BOTTLES DRAWN AEROBIC AND ANAEROBIC 14CC EACH  IMMUNE:COMPROMISED   Culture NO GROWTH 4 DAYS  Final   Report Status PENDING  Incomplete  Culture, blood (Routine X 2) w Reflex to ID Panel     Status: None (Preliminary result)   Collection Time: 09/01/15 12:30 PM  Result Value Ref Range Status   Specimen Description BLOOD LEFT ARM  Final   Special Requests   Final    BOTTLES DRAWN AEROBIC AND ANAEROBIC 12CC EACH  IMMUNE:COMPROMISED   Culture NO GROWTH 4 DAYS  Final   Report Status PENDING  Incomplete     Basic  Metabolic Panel:  Recent La96/04/542/05/17 0702  NA 129*  K 3.9  CL 103  CO2 20*  GLUCOSE 103*  BUN 62*  CREATININE 1.94*  CALCIUM 7.0*   Liver Function Tests: No results for input(s): AST, ALT, ALKPHOS, BILITOT, PROT, ALBUMIN in the last 72 hours.   CBC:  Recent Labs  09/04/15 0618 09/05/15 1351 09/06/15 0547  WBC 13.6* 11.6* 11.5*  NEUTROABS 9.9*  --  8.3*  HGB 8.9* 9.3* 9.0*  HCT 25.4* 27.5* 26.7*  MCV 93.4 95.2 95.4  PLT 159 188 221    Mr Brain Wo Contrast  09/06/2015  CLINICAL DATA:  New onset RIGHT leg weakness today, assess for mass or infarct. History of COPD,  hypertension, cirrhosis, alcohol and substance abuse. EXAM: MRI HEAD WITHOUT CONTRAST TECHNIQUE: Multiplanar, multiecho pulse sequences of the brain and surrounding structures were obtained without intravenous contrast. Patient unable to receive contrast due to GFR of 34. COMPARISON:  CT head Dec 04, 2014 FINDINGS: Sequences are mild to moderately motion degraded. The ventricles and sulci are mildly proportionally prominent for age patient's age. No abnormal parenchymal signal, mass lesions, mass effect. Patchy supratentorial white matter T2 hyperintensities. No reduced diffusion to suggest acute ischemia. Subcentimeter faint RIGHT mesial frontal lobe susceptibility artifact, nonspecific. No abnormal extra-axial fluid collections. No extra-axial masses though, contrast enhanced sequences would be more sensitive. Normal major intracranial vascular flow voids seen at the skull base. Ocular globes and orbital contents are unremarkable though not tailored for evaluation. No abnormal sellar expansion. No suspicious calvarial bone marrow signal. Craniocervical junction maintained. Visualized paranasal sinuses and mastoid air cells are well-aerated. Patient is edentulous. IMPRESSION: No acute intracranial process on this motion degraded examination. Mild global parenchymal brain volume loss for age. Mild chronic small vessel ischemic disease. Electronically Signed   By: Awilda Metro M.D.   On: 09/06/2015 00:49   Mr Lumbar Spine Wo Contrast  09/06/2015  CLINICAL DATA:  New onset RIGHT leg weakness today, assess for mass or infarct. History of COPD, hypertension, cirrhosis, alcohol and substance abuse. EXAM: MRI LUMBAR SPINE WITHOUT CONTRAST TECHNIQUE: Multiplanar, multisequence MR imaging of the lumbar spine was performed. No intravenous contrast was administered. No contrast administered due to GFR of 34. COMPARISON:  CT abdomen and pelvis August 31, 2005 FINDINGS: Sagittal sequences are mildly motion degraded,  axial sequences are moderately motion degraded. Using the reference level of the last well-formed intervertebral disc as L5-S1, moderate chronic T12 burst fracture, mild chronic L1 compression fracture. Moderate to severe L3 central height loss, with L3 superior and inferior endplate large Schmorl's nodes. Mild to moderate L5 superior endplate compression fracture with Schmorl's node. Additional chronic Schmorl's nodes. Mild L5-S1 disc height loss, with slight desiccation the lower lumbar discs. Old the level mild subacute to chronic discogenic endplate changes without convincing STIR signal abnormality to suggest acute osseous process. Subcentimeter S2 probable atypical hemangioma. Conus medullaris terminates at L1. Limited assessment of cauda equina due to motion. Mild epidural lipomatosis. Moderate symmetric paraspinal muscle T2 bright signal without atrophy. T11-12: Small amount of retropulsed bony fragments. Mild canal stenosis without neural foraminal narrowing. T12-L1: No significant disc bulge. Mild facet arthropathy and ligamentum flavum redundancy without canal stenosis or neural foraminal narrowing. L1-2: No significant disc bulge. Mild facet arthropathy and ligamentum flavum redundancy without canal stenosis or neural foraminal narrowing. L2-3: Small broad-based disc bulge, mild facet arthropathy and ligamentum flavum redundancy. Mild canal stenosis and partial effacement of thecal sac due to epidural fat. No  neural foraminal narrowing. L3-4: Small broad-based disc osteophyte complex, mild facet arthropathy and ligamentum flavum redundancy. Mild canal stenosis and partial effacement of the thecal sac due to epidural fat. Moderate RIGHT, mild LEFT neural foraminal narrowing. L4-5: Small broad-based disc bulge. Moderate facet arthropathy and ligamentum flavum redundancy. No canal stenosis, partial effacement thecal sac due to epidural fat. Mild to moderate LEFT neural foraminal narrowing. L5-S1: Small  broad-based disc bulge. Moderate facet arthropathy and ligamentum flavum redundancy without canal stenosis. Moderate RIGHT neural foraminal narrowing. IMPRESSION: Motion degraded examination. Degenerative change of the lumbar spine, superimposed on of epidural lipomatosis without definite epidural abscess on noncontrast examination. Multiple old fractures including moderate to severe L3 compression fracture. Mild canal stenosis L2-3 and L3-4. Neural foraminal narrowing L3-4 through L5-S1: Moderate on the RIGHT at L3-4 and L5-S1. Electronically Signed   By: Awilda Metro M.D.   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 Continue physical therapy and mobilize patient daily out of bed to chair 3 times a day  Consultants: Neurology requested, infectious disease, cardiology, gastroenterology orthopedic surgery    Procedures   Antibiotics: teflaro                  Code Status  Family Communication:  Spoke with patient  Disposition Plan see plan above  Time spent: 30 minutes   LOS: 9 days   Jennelle Pinkstaff M   09/06/2015, 7:05 AM

## 2015-09-06 NOTE — Consult Note (Signed)
[place] A. Merlene Laughter, MD     [email or web address]          Roberto [name] is an 70 y.o. male.   ASSESSMENT/PLAN: Roberto [name] 1. Subacute paraplegia of unclear etiology but concerning for spinal cord compression. 2. Altered mental status. Patient likely has acute on chronic cognitive impairment. This is likely due to complications of alcoholism.  RECOMMENDATION: Cervical spine MRI and thoracic spine MRI. Thiamine replacement.   [name] who presented to the hospital week ago with leg weakness and gait impairment. He reported at that time that he could not walk. History difficult because of significant cognitive impairment that the patient has. He has had a workup which so far has been unrevealing. The history is confusing as he is quite confused with a lot of confabulation. The review of systems therefore impossible.  GENERAL: He is in no acute distress.  HEENT: Supple. Atraumatic normocephalic.   ABDOMEN: soft  EXTREMITIES: 2-3+ pitting edema of the lower extremities all way up to the sacrum.  BACK: Normal.  SKIN: Normal by inspection.    MENTAL STATUS: The patient is awake and alert but quite confused. He has significant confabulation and nonsensical speech. He is not oriented to time or location. He does follow commands.  CRANIAL NERVES: Pupils are equal, round and reactive to light and accommodation; extra ocular movements are full, there is no significant nystagmus; visual fields are full; upper and lower facial muscles are normal in strength and symmetric, there is no flattening of the nasolabial folds; tongue is midline; uvula is midline; shoulder elevation is normal.  MOTOR: Normal tone, bulk and strength arms; no pronator drift. Legs are 0/5 both proximally and distally on both sides. Tone is slightly increased. Bulk is normal.  COORDINATION: Left finger to nose is normal, right finger to nose is normal, No rest tremor; no  intention tremor; no postural tremor; no bradykinesia.  REFLEXES: Deep tendon reflexes are symmetrical and normal. Babinski reflexes are equivocal bilaterally.   SENSATION: Severe response to deep painful some mild legs.      [[[[[[[[[[[[[[[[[[ADMISSION NOTE  Can't walk", right knee pain, shortness of breath, weakness  HPI: Patient is a 70 year old man with known alcoholic cirrhosis who is noncompliant with medical treatment who presents to the hospital today with a myriad of complaints as stated above. He states 2 weeks ago he fell in the shower and suffered a laceration on his left knee after which his knee has become quite swollen. He states that 3 days ago he went on a "drunk binge" during which he has taken anywhere from 12-18 beers a day. His last drink was 2 days ago. Upon my exam he appears visibly agitated and anxious, is tremorous, complains of severe right upper quadrant pain. We are asked to admit him for evaluation and management]]]]]]]    Blood pressure 103/59, pulse 64, temperature 97.5 F (36.4 C), temperature source Oral, resp. rate 20, height 6' (1.829 m), weight 96.163 kg (212 lb), SpO2 96 %.  Past Medical History  Diagnosis Date  . COPD (chronic obstructive pulmonary disease) ([place])   . Hypertension   . Hepatic steatosis 12/14/2011  . Compression fracture of L1 lumbar vertebra ([place]) 12/14/2011  . C2 cervical fracture ([place])   . Duodenal ulcer with hemorrhage 12/15/2011    Likely source of bleeding.per EGD; Dr. Laural Golden  . Esophageal varices without bleeding ([place]) 12/15/2011    per EGD  . Cirrhosis ([place]) 12/16/2011  per ultrasound  . Hx of substance abuse 12/17/2011  . Alcohol dependence with alcohol-induced persisting dementia ([place]) 12/18/2011  . Hepatic encephalopathy ([place])   . Helicobacter pylori antibody positive 12/19/2011  . Encephalopathy 05/18/2012  . UTI (urinary tract infection) 12/13/2011  . Ascites 05/18/2012    S/p paracentesis yielding 7880 mL  .  Cirrhosis ([place])   . Depression     Past Surgical History  Procedure Laterality Date  . Coronary angioplasty with stent placement      History reviewed. No pertinent family history.  Social History:  reports that he has been smoking Cigarettes.  He has been smoking about 1.00 pack per day. He uses smokeless tobacco. He reports that he drinks alcohol. He reports that he uses illicit drugs (Cocaine).  Allergies: No Known Allergies  Medications: Prior to Admission medications   Medication Sig Start Date End Date Taking? Authorizing Provider  albuterol (PROVENTIL) (2.5 MG/3ML) 0.083% nebulizer solution Take 3 mLs (2.5 mg total) by nebulization every 6 (six) hours as needed for wheezing or shortness of breath. 06/10/15  Yes Derrill Center, NP  diphenhydrAMINE (BENADRYL) 25 MG tablet Take 1 tablet (25 mg total) by mouth at bedtime as needed for sleep. 06/19/15  Yes Merrily Pew, MD  furosemide (LASIX) 20 MG tablet Take 20 mg by mouth daily. 08/17/15  Yes Historical Provider, MD  HYDROcodone-acetaminophen (NORCO/VICODIN) 5-325 MG tablet Take 1 tablet by mouth every 6 (six) hours as needed for severe pain. 08/03/15  Yes Forde Dandy, MD  hydroxypropyl methylcellulose / hypromellose (ISOPTO TEARS / GONIOVISC) 2.5 % ophthalmic solution Place 1 drop into both eyes as needed for dry eyes. Reported on 07/22/2015   Yes Historical Provider, MD  ibuprofen (ADVIL,MOTRIN) 200 MG tablet Take 800 mg by mouth every 6 (six) hours as needed for mild pain.   Yes Historical Provider, MD  lactulose (CHRONULAC) 10 GM/15ML solution Take 15 mLs by mouth 3 (three) times daily as needed. 07/22/15  Yes Historical Provider, MD  Menthol-Methyl Salicylate (BENGAY ARTHRITIS FORMULA EX) Apply 1 application topically 3 (three) times daily as needed.   Yes Historical Provider, MD  omeprazole (PRILOSEC) 20 MG capsule Take 20 mg by mouth daily.   Yes Historical Provider, MD  Oxycodone HCl 10 MG TABS Take 10 mg by mouth 3 (three)  times daily as needed. 08/11/15  Yes Historical Provider, MD  spironolactone (ALDACTONE) 25 MG tablet Take 1 tablet (25 mg total) by mouth daily. 07/22/15  Yes Butch Penny, NP  chlordiazePOXIDE (LIBRIUM) 25 MG capsule '50mg'$  PO TID x 1D, then 25-'50mg'$  PO BID X 1D, then 25-'50mg'$  PO QD X 1D Patient not taking: Reported on 08/27/2015 06/19/15   Merrily Pew, MD  Multiple Vitamin (MULTIVITAMIN WITH MINERALS) TABS tablet Take 1 tablet by mouth daily. Patient not taking: Reported on 08/27/2015 06/10/15   Derrill Center, NP    Scheduled Meds: . ceFTAROline (TEFLARO) IV  400 mg Intravenous Q12H  . feeding supplement (ENSURE ENLIVE)  237 mL Oral BID BM  . folic acid  1 mg Oral Daily  . multivitamin with minerals  1 tablet Oral Daily  . pantoprazole  40 mg Oral Daily  . polyethylene glycol  17 g Oral Daily  . rifaximin  550 mg Oral BID  . sodium chloride flush  3 mL Intravenous Q12H  . sodium chloride flush  3 mL Intravenous Q12H   Continuous Infusions: . sodium chloride 50 mL/hr at 09/05/15 1811   PRN Meds:.sodium chloride, acetaminophen, albuterol,  LORazepam, ondansetron **OR** ondansetron (ZOFRAN) IV, oxyCODONE, polyvinyl alcohol, senna-docusate, sodium chloride flush     Results for orders placed or performed during the hospital encounter of 08/27/15 (from the past 48 hour(s))  Basic metabolic panel     Status: Abnormal   Collection Time: 09/05/15  7:02 AM  Result Value Ref Range   Sodium 129 (L) 135 - 145 mmol/L   Potassium 3.9 3.5 - 5.1 mmol/L   Chloride 103 101 - 111 mmol/L   CO2 20 (L) 22 - 32 mmol/L   Glucose, Bld 103 (H) 65 - 99 mg/dL   BUN 62 (H) 6 - 20 mg/dL   Creatinine, Ser 1.94 (H) 0.61 - 1.24 mg/dL   Calcium 7.0 (L) 8.9 - 10.3 mg/dL   GFR calc non Af Amer 34 (L) >60 mL/min   GFR calc Af Amer 39 (L) >60 mL/min    Comment: (NOTE) The eGFR has been calculated using the CKD EPI equation. This calculation has not been validated in all clinical situations. eGFR's persistently  <60 mL/min signify possible Chronic Kidney Disease.    Anion gap 6 5 - 15  RPR     Status: None   Collection Time: 09/05/15  1:51 PM  Result Value Ref Range   RPR Ser Ql Non Reactive Non Reactive    Comment: (NOTE) Performed At: [facility] [place], [place] [id number] Lindon Romp MD [id number]   Vitamin B12     Status: Abnormal   Collection Time: 09/05/15  1:51 PM  Result Value Ref Range   Vitamin B-12 2114 (H) 180 - 914 pg/mL    Comment: (NOTE) This assay is not validated for testing neonatal or myeloproliferative syndrome specimens for Vitamin B12 levels. Performed at [facility]   Sedimentation rate     Status: Abnormal   Collection Time: 09/05/15  1:51 PM  Result Value Ref Range   Sed Rate 126 (H) 0 - 16 mm/hr  CBC     Status: Abnormal   Collection Time: 09/05/15  1:51 PM  Result Value Ref Range   WBC 11.6 (H) 4.0 - 10.5 K/uL   RBC 2.89 (L) 4.22 - 5.81 MIL/uL   Hemoglobin 9.3 (L) 13.0 - 17.0 g/dL   HCT 27.5 (L) 39.0 - 52.0 %   MCV 95.2 78.0 - 100.0 fL   MCH 32.2 26.0 - 34.0 pg   MCHC 33.8 30.0 - 36.0 g/dL   RDW 17.8 (H) 11.5 - 15.5 %   Platelets 188 150 - 400 K/uL  TSH     Status: None   Collection Time: 09/05/15  1:51 PM  Result Value Ref Range   TSH 2.535 0.350 - 4.500 uIU/mL  Folate     Status: None   Collection Time: 09/05/15  1:51 PM  Result Value Ref Range   Folate 36.5 >5.9 ng/mL    Comment: Performed at [facility]  HIV antibody (routine testing) (NOT for [facility])     Status: None   Collection Time: 09/05/15  1:51 PM  Result Value Ref Range   HIV Screen 4th Generation wRfx Non Reactive Non Reactive    Comment: (NOTE) Performed At: [facility] [place], [place] [id number] Lindon Romp MD QQ:[id number]   Basic metabolic panel     Status: Abnormal   Collection Time: 09/06/15  5:47 AM  Result Value Ref Range   Sodium 131 (L) 135 - 145 mmol/L   Potassium 4.3 3.5 - 5.1 mmol/L  Chloride 105 101 - 111 mmol/L   CO2 21 (L) 22 - 32 mmol/L   Glucose, Bld 107 (H) 65 - 99 mg/dL   BUN 57 (H) 6 - 20 mg/dL   Creatinine, Ser 1.65 (H) 0.61 - 1.24 mg/dL   Calcium 7.2 (L) 8.9 - 10.3 mg/dL   GFR calc non Af Amer 41 (L) >60 mL/min   GFR calc Af Amer 48 (L) >60 mL/min    Comment: (NOTE) The eGFR has been calculated using the CKD EPI equation. This calculation has not been validated in all clinical situations. eGFR's persistently <60 mL/min signify possible Chronic Kidney Disease.    Anion gap 5 5 - 15  CBC with Differential/Platelet     Status: Abnormal   Collection Time: 09/06/15  5:47 AM  Result Value Ref Range   WBC 11.5 (H) 4.0 - 10.5 K/uL   RBC 2.80 (L) 4.22 - 5.81 MIL/uL   Hemoglobin 9.0 (L) 13.0 - 17.0 g/dL   HCT 26.7 (L) 39.0 - 52.0 %   MCV 95.4 78.0 - 100.0 fL   MCH 32.1 26.0 - 34.0 pg   MCHC 33.7 30.0 - 36.0 g/dL   RDW 18.2 (H) 11.5 - 15.5 %   Platelets 221 150 - 400 K/uL   Neutrophils Relative % 72 %   Neutro Abs 8.3 (H) 1.7 - 7.7 K/uL   Lymphocytes Relative 15 %   Lymphs Abs 1.8 0.7 - 4.0 K/uL   Monocytes Relative 10 %   Monocytes Absolute 1.1 (H) 0.1 - 1.0 K/uL   Eosinophils Relative 2 %   Eosinophils Absolute 0.2 0.0 - 0.7 K/uL   Basophils Relative 1 %   Basophils Absolute 0.1 0.0 - 0.1 K/uL    Studies/Results:  L SPINE MRI Sagittal sequences are mildly motion degraded, axial sequences are moderately motion degraded.   Using the reference level of the last well-formed intervertebral disc as L5-S1, moderate chronic T12 burst fracture, mild chronic L1 compression fracture. Moderate to severe L3 central height loss, with L3 superior and inferior endplate large Schmorl's nodes. Mild to moderate L5 superior endplate compression fracture with Schmorl's node. Additional chronic Schmorl's nodes. Mild L5-S1 disc height loss, with slight desiccation the lower lumbar discs. Old the level mild subacute to chronic discogenic endplate changes  without convincing STIR signal abnormality to suggest acute osseous process. Subcentimeter S2 probable atypical hemangioma.   Conus medullaris terminates at L1. Limited assessment of cauda equina due to motion. Mild epidural lipomatosis. Moderate symmetric paraspinal muscle T2 bright signal without atrophy.   T11-12: Small amount of retropulsed bony fragments. Mild canal stenosis without neural foraminal narrowing.   T12-L1: No significant disc bulge. Mild facet arthropathy and ligamentum flavum redundancy without canal stenosis or neural foraminal narrowing.   L1-2: No significant disc bulge. Mild facet arthropathy and ligamentum flavum redundancy without canal stenosis or neural foraminal narrowing.   L2-3: Small broad-based disc bulge, mild facet arthropathy and ligamentum flavum redundancy. Mild canal stenosis and partial effacement of thecal sac due to epidural fat. No neural foraminal narrowing.   L3-4: Small broad-based disc osteophyte complex, mild facet arthropathy and ligamentum flavum redundancy. Mild canal stenosis and partial effacement of the thecal sac due to epidural fat. Moderate RIGHT, mild LEFT neural foraminal narrowing.   L4-5: Small broad-based disc bulge. Moderate facet arthropathy and ligamentum flavum redundancy. No canal stenosis, partial effacement thecal sac due to epidural fat. Mild to moderate LEFT neural foraminal narrowing.   L5-S1: Small broad-based disc bulge. Moderate  facet arthropathy and ligamentum flavum redundancy without canal stenosis. Moderate RIGHT neural foraminal narrowing.   IMPRESSION: Motion degraded examination.   Degenerative change of the lumbar spine, superimposed on of epidural lipomatosis without definite epidural abscess on noncontrast examination.   Multiple old fractures including moderate to severe L3 compression fracture.   Mild canal stenosis L2-3 and L3-4. Neural foraminal narrowing L3-4 through L5-S1:  Moderate on the RIGHT at L3-4 and L5-S1.    BRAIN MRI Sequences are mild to moderately motion degraded.   The ventricles and sulci are mildly proportionally prominent for age patient's age. No abnormal parenchymal signal, mass lesions, mass effect. Patchy supratentorial white matter T2 hyperintensities. No reduced diffusion to suggest acute ischemia. Subcentimeter faint RIGHT mesial frontal lobe susceptibility artifact, nonspecific.   No abnormal extra-axial fluid collections. No extra-axial masses though, contrast enhanced sequences would be more sensitive. Normal major intracranial vascular flow voids seen at the skull base.   Ocular globes and orbital contents are unremarkable though not tailored for evaluation. No abnormal sellar expansion. No suspicious calvarial bone marrow signal. Craniocervical junction maintained. Visualized paranasal sinuses and mastoid air cells are well-aerated. Patient is edentulous.   IMPRESSION: No acute intracranial process on this motion degraded examination.   Mild global parenchymal brain volume loss for age. Mild chronic small vessel ischemic disease.     Sher Hellinger A. Merlene Laughter, M.D.  Diplomate, Tax adviser of Psychiatry and Neurology ( Neurology). 09/06/2015, 6:02 PM

## 2015-09-06 NOTE — Evaluation (Signed)
Physical Therapy Evaluation Patient Details Name: Roberto [name] MRN: [id number] DOB: 1946-07-14 Today's Date: 09/06/2015   History of Present Illness  Pt is a 70 year old male admitted with colonic ileus.  He was also found to have anemia, AKI, staph aureus sepsis, alcoholic cirrhosis.  During this hospital stay he has developed profound LE weakness and CT/MRI reveal paraspinal soft tissue edema, and old T12, L3 compression fx.  Pt lives alone and states that he is normally independent but does acknowledge frequent falls, especially when drinking.  Clinical Impression   Pt was seen for evaluation.  He is now fully awake, oriented and able to follow all directions.  He is found to have significant LE edema and profound bilateral LE weakness.   He does have some trunk control and UE strength is functional.  At this point, he requires total assist for all mobility and nursing service will need to use a full lift to transfer pt to a chair.  He will need SNF at d/c.  Pt is agreeable to this and requests [facility] as his mother lives there.    Follow Up Recommendations SNF    Equipment Recommendations  None recommended by PT    Recommendations for Other Services  OT     Precautions / Restrictions Precautions Precautions: Fall Restrictions Weight Bearing Restrictions: No      Mobility  Bed Mobility Overal bed mobility: + 2 for safety/equipment             General bed mobility comments: needs total assist  Transfers                    Ambulation/Gait                Stairs            Wheelchair Mobility    Modified Rankin (Stroke Patients Only)       Balance Overall balance assessment:  (unable to test)                                           Pertinent Vitals/Pain Pain Assessment: No/denies pain    Home Living Family/patient expects to be discharged to:: Skilled nursing facility                      Prior Function  Level of Independence: Independent with assistive device(s)               Hand Dominance   Dominant Hand: Right    Extremity/Trunk Assessment   Upper Extremity Assessment: Overall WFL for tasks assessed           Lower Extremity Assessment: RLE deficits/detail;LLE deficits/detail RLE Deficits / Details: 0/5 at knee and ankle, 1/5 at hip...does have sensation LLE Deficits / Details: 0/5 at ankle and knee, 1+/5 at hip...does have sensation     Communication   Communication: No difficulties  Cognition Arousal/Alertness: Awake/alert Behavior During Therapy: WFL for tasks assessed/performed Overall Cognitive Status: Within Functional Limits for tasks assessed                      General Comments      Exercises        Assessment/Plan    PT Assessment Patient needs continued PT services  PT Diagnosis     PT Problem List Decreased strength;Decreased  mobility  PT Treatment Interventions Therapeutic exercise   PT Goals (Current goals can be found in the Care Plan section) Acute Rehab PT Goals Patient Stated Goal: wants to regain independence PT Goal Formulation: With patient Time For Goal Achievement: 09/20/15 Potential to Achieve Goals: Fair    Frequency Min 3X/week   Barriers to discharge Decreased caregiver support lives alone    Co-evaluation               End of Session   Activity Tolerance: Patient tolerated treatment well Patient left: in bed;with call bell/phone within reach;with bed alarm set;with nursing/sitter in room Nurse Communication: Mobility status         Time: 4782-9562 PT Time Calculation (min) (ACUTE ONLY): 29 min   Charges:   PT Evaluation $PT Eval Moderate Complexity: 1 Procedure     PT G CodesMyrlene Broker L  PT 09/06/2015, 10:12 AM [phone]

## 2015-09-07 ENCOUNTER — Inpatient Hospital Stay (HOSPITAL_COMMUNITY): Payer: Medicare Other

## 2015-09-07 ENCOUNTER — Encounter (HOSPITAL_COMMUNITY)
Admission: EM | Disposition: A | Discharge status: Skilled Nursing Facility | Payer: Self-pay | Source: Home / Self Care | Attending: Family Medicine

## 2015-09-07 ENCOUNTER — Inpatient Hospital Stay (HOSPITAL_COMMUNITY): Payer: Medicare Other | Admitting: Anesthesiology

## 2015-09-07 ENCOUNTER — Encounter (HOSPITAL_COMMUNITY): Payer: Self-pay | Admitting: Certified Registered Nurse Anesthetist

## 2015-09-07 HISTORY — PX: THORACIC LAMINECTOMY FOR EPIDURAL ABSCESS: SHX6115

## 2015-09-07 LAB — POCT I-STAT 7, (LYTES, BLD GAS, ICA,H+H)
ACID-BASE DEFICIT: 6 mmol/L — AB (ref 0.0–2.0)
Acid-base deficit: 7 mmol/L — ABNORMAL HIGH (ref 0.0–2.0)
BICARBONATE: 22.1 meq/L (ref 20.0–24.0)
BICARBONATE: 19.7 meq/L — AB (ref 20.0–24.0)
CALCIUM ION: 1.08 mmol/L — AB (ref 1.13–1.30)
CALCIUM ION: 1 mmol/L — AB (ref 1.13–1.30)
HCT: 30 % — ABNORMAL LOW (ref 39.0–52.0)
HCT: 28 % — ABNORMAL LOW (ref 39.0–52.0)
HEMOGLOBIN: 10.2 g/dL — AB (ref 13.0–17.0)
Hemoglobin: 9.5 g/dL — ABNORMAL LOW (ref 13.0–17.0)
O2 SAT: 99 %
O2 Saturation: 99 %
POTASSIUM: 4.8 mmol/L (ref 3.5–5.1)
Patient temperature: 36.2
Potassium: 4.5 mmol/L (ref 3.5–5.1)
SODIUM: 134 mmol/L — AB (ref 135–145)
SODIUM: 134 mmol/L — AB (ref 135–145)
TCO2: 24 mmol/L (ref 0–100)
TCO2: 21 mmol/L (ref 0–100)
pCO2 arterial: 52.5 mmHg — ABNORMAL HIGH (ref 35.0–45.0)
pCO2 arterial: 41.6 mmHg (ref 35.0–45.0)
pH, Arterial: 7.227 — ABNORMAL LOW (ref 7.350–7.450)
pH, Arterial: 7.28 — ABNORMAL LOW (ref 7.350–7.450)
pO2, Arterial: 178 mmHg — ABNORMAL HIGH (ref 80.0–100.0)
pO2, Arterial: 166 mmHg — ABNORMAL HIGH (ref 80.0–100.0)

## 2015-09-07 LAB — BASIC METABOLIC PANEL
ANION GAP: 5 (ref 5–15)
BUN: 49 mg/dL — ABNORMAL HIGH (ref 6–20)
CHLORIDE: 104 mmol/L (ref 101–111)
CO2: 22 mmol/L (ref 22–32)
CREATININE: 1.55 mg/dL — AB (ref 0.61–1.24)
Calcium: 7.3 mg/dL — ABNORMAL LOW (ref 8.9–10.3)
GFR calc non Af Amer: 45 mL/min — ABNORMAL LOW (ref >60)
GFR, EST AFRICAN AMERICAN: 52 mL/min — AB (ref >60)
Glucose, Bld: 101 mg/dL — ABNORMAL HIGH (ref 65–99)
POTASSIUM: 4.2 mmol/L (ref 3.5–5.1)
SODIUM: 131 mmol/L — AB (ref 135–145)

## 2015-09-07 LAB — PROTIME-INR
INR: 1.78 — ABNORMAL HIGH (ref 0.00–1.49)
PROTHROMBIN TIME: 20.7 s — AB (ref 11.6–15.2)

## 2015-09-07 LAB — ABO/RH: ABO/RH(D): A POS

## 2015-09-07 LAB — APTT: aPTT: 42 seconds — ABNORMAL HIGH (ref 24–37)

## 2015-09-07 LAB — PREPARE RBC (CROSSMATCH)

## 2015-09-07 SURGERY — THORACIC LAMINECTOMY FOR EPIDURAL ABSCESS
Anesthesia: General | Site: Spine Thoracic

## 2015-09-07 MED ORDER — BUPIVACAINE LIPOSOME 1.3 % IJ SUSP
20.0000 mL | INTRAMUSCULAR | Status: DC
Start: 2015-09-07 — End: 2015-09-08
  Filled 2015-09-07 (×2): qty 20

## 2015-09-07 MED ORDER — VANCOMYCIN HCL 1000 MG IV SOLR
INTRAVENOUS | Status: DC | PRN
  Administered 2015-09-07: 1000 mg via TOPICAL

## 2015-09-07 MED ORDER — METOCLOPRAMIDE HCL 5 MG/ML IJ SOLN
INTRAMUSCULAR | Status: DC | PRN
  Administered 2015-09-07: 10 mg via INTRAVENOUS

## 2015-09-07 MED ORDER — LIDOCAINE HCL (CARDIAC) 20 MG/ML IV SOLN
INTRAVENOUS | Status: AC
  Filled 2015-09-07: qty 5

## 2015-09-07 MED ORDER — SUGAMMADEX SODIUM 200 MG/2ML IV SOLN
INTRAVENOUS | Status: DC | PRN
  Administered 2015-09-07: 200 mg via INTRAVENOUS

## 2015-09-07 MED ORDER — SODIUM CHLORIDE 0.9 % IR SOLN
Status: DC | PRN
  Administered 2015-09-07: 20:00:00

## 2015-09-07 MED ORDER — ROCURONIUM BROMIDE 100 MG/10ML IV SOLN
INTRAVENOUS | Status: DC | PRN
  Administered 2015-09-07: 10 mg via INTRAVENOUS

## 2015-09-07 MED ORDER — DEXAMETHASONE SODIUM PHOSPHATE 10 MG/ML IJ SOLN
INTRAMUSCULAR | Status: DC | PRN
  Administered 2015-09-07: 10 mg via INTRAVENOUS

## 2015-09-07 MED ORDER — THROMBIN 20000 UNITS EX SOLR
CUTANEOUS | Status: DC | PRN
  Administered 2015-09-07: 21:00:00 via TOPICAL

## 2015-09-07 MED ORDER — METOCLOPRAMIDE HCL 5 MG/ML IJ SOLN
INTRAMUSCULAR | Status: AC
  Filled 2015-09-07: qty 2

## 2015-09-07 MED ORDER — LIDOCAINE-EPINEPHRINE 1 %-1:100000 IJ SOLN
INTRAMUSCULAR | Status: DC | PRN
  Administered 2015-09-07: 20 mL

## 2015-09-07 MED ORDER — LIDOCAINE HCL (CARDIAC) 20 MG/ML IV SOLN
INTRAVENOUS | Status: DC | PRN
  Administered 2015-09-07: 60 mg via INTRAVENOUS

## 2015-09-07 MED ORDER — LACTATED RINGERS IV SOLN
INTRAVENOUS | Status: DC | PRN
  Administered 2015-09-07 (×2): via INTRAVENOUS

## 2015-09-07 MED ORDER — VANCOMYCIN HCL 1000 MG IV SOLR
INTRAVENOUS | Status: AC
  Filled 2015-09-07: qty 1000

## 2015-09-07 MED ORDER — PROMETHAZINE HCL 25 MG/ML IJ SOLN: 6.2500 mg | INTRAMUSCULAR | Status: DC | PRN

## 2015-09-07 MED ORDER — PROPOFOL 10 MG/ML IV BOLUS
INTRAVENOUS | Status: DC | PRN
Start: 2015-09-07 — End: 2015-09-07
  Administered 2015-09-07: 100 mg via INTRAVENOUS

## 2015-09-07 MED ORDER — MIDAZOLAM HCL 5 MG/5ML IJ SOLN
INTRAMUSCULAR | Status: DC | PRN
  Administered 2015-09-07: 2 mg via INTRAVENOUS

## 2015-09-07 MED ORDER — THROMBIN 5000 UNITS EX SOLR
OROMUCOSAL | Status: DC | PRN
  Administered 2015-09-07 (×4): via TOPICAL

## 2015-09-07 MED ORDER — MIDAZOLAM HCL 2 MG/2ML IJ SOLN
INTRAMUSCULAR | Status: AC
  Filled 2015-09-07: qty 2

## 2015-09-07 MED ORDER — HYDROMORPHONE HCL 1 MG/ML IJ SOLN
INTRAMUSCULAR | Status: AC
  Filled 2015-09-07: qty 1

## 2015-09-07 MED ORDER — 0.9 % SODIUM CHLORIDE (POUR BTL) OPTIME
TOPICAL | Status: DC | PRN
  Administered 2015-09-07: 1000 mL

## 2015-09-07 MED ORDER — SUCCINYLCHOLINE CHLORIDE 20 MG/ML IJ SOLN
INTRAMUSCULAR | Status: DC | PRN
  Administered 2015-09-07: 120 mg via INTRAVENOUS

## 2015-09-07 MED ORDER — ONDANSETRON HCL 4 MG/2ML IJ SOLN
INTRAMUSCULAR | Status: DC | PRN
  Administered 2015-09-07: 4 mg via INTRAVENOUS

## 2015-09-07 MED ORDER — DEXAMETHASONE SODIUM PHOSPHATE 10 MG/ML IJ SOLN
10.0000 mg | Freq: Once | INTRAMUSCULAR | Status: DC
  Filled 2015-09-07: qty 1

## 2015-09-07 MED ORDER — HYDROMORPHONE HCL 1 MG/ML IJ SOLN
0.2500 mg | INTRAMUSCULAR | Status: DC | PRN
  Administered 2015-09-07 – 2015-09-08 (×4): 0.5 mg via INTRAVENOUS

## 2015-09-07 MED ORDER — PHENYLEPHRINE 40 MCG/ML (10ML) SYRINGE FOR IV PUSH (FOR BLOOD PRESSURE SUPPORT)
PREFILLED_SYRINGE | INTRAVENOUS | Status: AC
  Filled 2015-09-07: qty 10

## 2015-09-07 MED ORDER — FENTANYL CITRATE (PF) 100 MCG/2ML IJ SOLN
INTRAMUSCULAR | Status: DC | PRN
  Administered 2015-09-07 (×2): 100 ug via INTRAVENOUS
  Administered 2015-09-07: 50 ug via INTRAVENOUS

## 2015-09-07 MED ORDER — SUGAMMADEX SODIUM 200 MG/2ML IV SOLN
INTRAVENOUS | Status: AC
  Filled 2015-09-07: qty 2

## 2015-09-07 MED ORDER — PROPOFOL 10 MG/ML IV BOLUS
INTRAVENOUS | Status: AC
  Filled 2015-09-07: qty 20

## 2015-09-07 MED ORDER — ONDANSETRON HCL 4 MG/2ML IJ SOLN
INTRAMUSCULAR | Status: AC
  Filled 2015-09-07: qty 4

## 2015-09-07 MED ORDER — BUPIVACAINE LIPOSOME 1.3 % IJ SUSP
INTRAMUSCULAR | Status: DC | PRN
  Administered 2015-09-07: 20 mL

## 2015-09-07 MED ORDER — FENTANYL CITRATE (PF) 250 MCG/5ML IJ SOLN
INTRAMUSCULAR | Status: AC
  Filled 2015-09-07: qty 5

## 2015-09-07 MED ORDER — CEFAZOLIN SODIUM-DEXTROSE 2-3 GM-% IV SOLR
INTRAVENOUS | Status: DC | PRN
  Administered 2015-09-07: 2 g via INTRAVENOUS

## 2015-09-07 SURGICAL SUPPLY — 72 items
ADH SKN CLS APL DERMABOND .7 (GAUZE/BANDAGES/DRESSINGS)
ADH SKN CLS LQ APL DERMABOND (GAUZE/BANDAGES/DRESSINGS)
APL SKNCLS STERI-STRIP NONHPOA (GAUZE/BANDAGES/DRESSINGS) ×1
BENZOIN TINCTURE PRP APPL 2/3 (GAUZE/BANDAGES/DRESSINGS) ×3 IMPLANT
BLADE CLIPPER SURG (BLADE) IMPLANT
BLADE SURG 11 STRL SS (BLADE) ×3 IMPLANT
BUR MATCHSTICK NEURO 3.0 LAGG (BURR) ×3 IMPLANT
BUR ROUND FLUTED 5 RND (BURR) ×2 IMPLANT
BUR ROUND FLUTED 5MM RND (BURR) ×1
CANISTER SUCT 3000ML PPV (MISCELLANEOUS) ×6 IMPLANT
CHLORAPREP W/TINT 26ML (MISCELLANEOUS) ×3 IMPLANT
DECANTER SPIKE VIAL GLASS SM (MISCELLANEOUS) ×3 IMPLANT
DERMABOND ADHESIVE PROPEN (GAUZE/BANDAGES/DRESSINGS)
DERMABOND ADVANCED (GAUZE/BANDAGES/DRESSINGS)
DERMABOND ADVANCED .7 DNX12 (GAUZE/BANDAGES/DRESSINGS) ×1 IMPLANT
DERMABOND ADVANCED .7 DNX6 (GAUZE/BANDAGES/DRESSINGS) ×1 IMPLANT
DRAPE C-ARM 42X72 X-RAY (DRAPES) ×2 IMPLANT
DRAPE MICROSCOPE LEICA (MISCELLANEOUS) IMPLANT
DRAPE POUCH INSTRU U-SHP 10X18 (DRAPES) ×3 IMPLANT
DRAPE SURG 17X23 STRL (DRAPES) ×3 IMPLANT
DRSG MEPILEX BORDER 4X12 (GAUZE/BANDAGES/DRESSINGS) ×2 IMPLANT
DRSG OPSITE 4X5.5 SM (GAUZE/BANDAGES/DRESSINGS) ×2 IMPLANT
ELECT REM PT RETURN 9FT ADLT (ELECTROSURGICAL) ×3
ELECTRODE REM PT RTRN 9FT ADLT (ELECTROSURGICAL) ×1 IMPLANT
GAUZE SPONGE 4X4 12PLY STRL (GAUZE/BANDAGES/DRESSINGS) ×2 IMPLANT
GAUZE SPONGE 4X4 16PLY XRAY LF (GAUZE/BANDAGES/DRESSINGS) ×2 IMPLANT
GLOVE BIOGEL PI IND STRL 7.5 (GLOVE) ×2 IMPLANT
GLOVE BIOGEL PI INDICATOR 7.5 (GLOVE) ×4
GLOVE EXAM NITRILE LRG STRL (GLOVE) IMPLANT
GLOVE EXAM NITRILE MD LF STRL (GLOVE) IMPLANT
GLOVE EXAM NITRILE XL STR (GLOVE) IMPLANT
GLOVE EXAM NITRILE XS STR PU (GLOVE) IMPLANT
GLOVE SS BIOGEL STRL SZ 7 (GLOVE) ×3 IMPLANT
GLOVE SUPERSENSE BIOGEL SZ 7 (GLOVE) ×6
GOWN STRL REUS W/ TWL LRG LVL3 (GOWN DISPOSABLE) ×2 IMPLANT
GOWN STRL REUS W/ TWL XL LVL3 (GOWN DISPOSABLE) IMPLANT
GOWN STRL REUS W/TWL LRG LVL3 (GOWN DISPOSABLE) ×6
GOWN STRL REUS W/TWL XL LVL3 (GOWN DISPOSABLE)
HEMOSTAT POWDER KIT SURGIFOAM (HEMOSTASIS) ×9 IMPLANT
KIT BASIN OR (CUSTOM PROCEDURE TRAY) ×3 IMPLANT
KIT ROOM TURNOVER OR (KITS) ×3 IMPLANT
NDL HYPO 21X1.5 SAFETY (NEEDLE) ×2 IMPLANT
NDL HYPO 25X1 1.5 SAFETY (NEEDLE) ×1 IMPLANT
NEEDLE HYPO 21X1.5 SAFETY (NEEDLE) ×6 IMPLANT
NEEDLE HYPO 25X1 1.5 SAFETY (NEEDLE) ×3 IMPLANT
NS IRRIG 1000ML POUR BTL (IV SOLUTION) ×3 IMPLANT
PACK LAMINECTOMY NEURO (CUSTOM PROCEDURE TRAY) ×3 IMPLANT
PACK UNIVERSAL I (CUSTOM PROCEDURE TRAY) ×3 IMPLANT
PAD ARMBOARD 7.5X6 YLW CONV (MISCELLANEOUS) ×9 IMPLANT
PATTIES SURGICAL .5X1.5 (GAUZE/BANDAGES/DRESSINGS) ×3 IMPLANT
PATTIES SURGICAL 1X1 (DISPOSABLE) ×2 IMPLANT
RUBBERBAND STERILE (MISCELLANEOUS) IMPLANT
SEALER BIPOLAR AQUA 2.3 (INSTRUMENTS) ×2 IMPLANT
SPONGE NEURO XRAY DETECT 1X3 (DISPOSABLE) ×3 IMPLANT
SPONGE SURGIFOAM ABS GEL 100 (HEMOSTASIS) ×3 IMPLANT
STRIP SURGICAL 1 X 6 IN (GAUZE/BANDAGES/DRESSINGS) IMPLANT
STRIP SURGICAL 1/2 X 6 IN (GAUZE/BANDAGES/DRESSINGS) IMPLANT
STRIP SURGICAL 1/4 X 6 IN (GAUZE/BANDAGES/DRESSINGS) IMPLANT
STRIP SURGICAL 3/4 X 6 IN (GAUZE/BANDAGES/DRESSINGS) IMPLANT
SUT VIC AB 0 CT1 18XCR BRD8 (SUTURE) ×2 IMPLANT
SUT VIC AB 0 CT1 8-18 (SUTURE) ×6
SUT VIC AB 2-0 CT1 18 (SUTURE) ×6 IMPLANT
SUT VIC AB 3-0 SH 8-18 (SUTURE) ×6 IMPLANT
SWAB COLLECTION DEVICE MRSA (MISCELLANEOUS) ×4 IMPLANT
SWAB CULTURE ESWAB REG 1ML (MISCELLANEOUS) ×4 IMPLANT
SYR 20CC LL (SYRINGE) ×3 IMPLANT
SYR 30ML LL (SYRINGE) ×3 IMPLANT
TOWEL OR 17X24 6PK STRL BLUE (TOWEL DISPOSABLE) ×3 IMPLANT
TOWEL OR 17X26 10 PK STRL BLUE (TOWEL DISPOSABLE) ×3 IMPLANT
TUBE CONNECTING 12'X1/4 (SUCTIONS)
TUBE CONNECTING 12X1/4 (SUCTIONS) ×1 IMPLANT
WATER STERILE IRR 1000ML POUR (IV SOLUTION) ×3 IMPLANT

## 2015-09-07 NOTE — Care Management (Signed)
Pt started on Xifaxan this admission. Benefits check reveals pt's monthly cost of medication will be $1335. Anticipate pt will be unable to afford this cost. Should be addressed by GI and pt prior to DC so other arrangements may be made.

## 2015-09-07 NOTE — Progress Notes (Signed)
Report called to [facility] at [facility], report given to [facility]. Patient transferred from unit by [facility].

## 2015-09-07 NOTE — Discharge Summary (Signed)
Physician Discharge Summary  Roberto [name] ION:[id number] DOB: Jul 11, 1946 DOA: 08/27/2015  PCP: Isabella Stalling, MD  Admit date: 08/27/2015 Discharge date: 09/07/2015   Recommendations for Outpatient Follow-up: Patient is transferred to [facility] hospitalist team and Dr. Renee Pain for stat neurosurgical consult with Dr. DDD ITT Y regarding progressive paraplegia and thoracic spinal epidural process noted on MRI this morning possibly infectious versus sanguinous have discussed the case with Dr. Bevely Palmer at length  Discharge Diagnoses:  Principal Problem:   Cirrhosis (HCC) Active Problems:   HTN (hypertension)   Alcohol withdrawal (HCC)   ETOH abuse   Discharge Condition: Guarded condition  Filed Weights   08/27/15 0710  Weight: 212 lb (96.163 kg)    History of present illness:  Patient is 70 year old white male known ethanol at cirrhotic with esophageal virus E's who came in after a drinking binge with ethanol withdrawal issues and a swollen tender right knee with possibility of a septic right knee cultures were drawn out staph septicemia which grew out to be MRSA was placed on thank and Zosyn initially orthopedic consult was obtained knee was tapped over fluid return was clear according to orthopedic surgery he was treated for 3 days for ethanol withdrawal and became less agitated more oriented and severe pain in right leg with guarding and splinting he then had worsening renal function possibly attributable to vancomycin and/or prerenal as a team he vancomycin was changed to Flora TEF LO RA antibiotic with subsequent normalization of renal function subsequent blood cultures were negative while on antibiotics for MRSA over the previous 54 hours was noted he had right leg and left leg weakness initially thought to be guarding and splinting stat CT scan of this central nervous system head and lumbosacral spine revealed lumbosacral spinal stenosis with multiple levels of  foraminal stenosis with no severe compression brain MRI revealed no evidence of acute infarct or bleed neurologic consultation was obtained and MRI of his cervical and thoracic spine was ordered revealing an acute compression fracture of T8 with an epidural process from T5-T9 this could not be defined whether with sanguinous or infectious or other due to the fact that he had staph septicemia neuro slot neurosurgical consultation was obtained over the phone with Dr. Darel Hong who agreed to accept the patient was transferred to the hospitalist service for urgent neurosurgical consultation the patient was informed of all events. Likewise the patient was seen by cardiology and containing the thoughts of possible endocarditis 2 transthoracic echoes were obtained with poor acoustic window and unable to comment risk benefit ratio trends thoracic transesophageal echo was thought not to be in the patient's favor and was elected to treat him for a full 6 weeks with presumptive therapy for endocarditis  Hospital Course:  See history of present illness above  Procedures:    Consultations:  Infectious disease, orthopedic surgery, gastroenterology, cardiology  Discharge Instructions     Medication List    ASK your doctor about these medications        albuterol (2.5 MG/3ML) 0.083% nebulizer solution  Commonly known as:  PROVENTIL  Take 3 mLs (2.5 mg total) by nebulization every 6 (six) hours as needed for wheezing or shortness of breath.     BENGAY ARTHRITIS FORMULA EX  Apply 1 application topically 3 (three) times daily as needed.     chlordiazePOXIDE 25 MG capsule  Commonly known as:  LIBRIUM  50mg  PO TID x 1D, then 25-50mg  PO BID X 1D, then 25-50mg  PO  QD X 1D     diphenhydrAMINE 25 MG tablet  Commonly known as:  BENADRYL  Take 1 tablet (25 mg total) by mouth at bedtime as needed for sleep.     furosemide 20 MG tablet  Commonly known as:  LASIX  Take 20 mg by mouth daily.      HYDROcodone-acetaminophen 5-325 MG tablet  Commonly known as:  NORCO/VICODIN  Take 1 tablet by mouth every 6 (six) hours as needed for severe pain.     hydroxypropyl methylcellulose / hypromellose 2.5 % ophthalmic solution  Commonly known as:  ISOPTO TEARS / GONIOVISC  Place 1 drop into both eyes as needed for dry eyes. Reported on 07/22/2015     ibuprofen 200 MG tablet  Commonly known as:  ADVIL,MOTRIN  Take 800 mg by mouth every 6 (six) hours as needed for mild pain.     lactulose 10 GM/15ML solution  Commonly known as:  CHRONULAC  Take 15 mLs by mouth 3 (three) times daily as needed.     multivitamin with minerals Tabs tablet  Take 1 tablet by mouth daily.     omeprazole 20 MG capsule  Commonly known as:  PRILOSEC  Take 20 mg by mouth daily.     Oxycodone HCl 10 MG Tabs  Take 10 mg by mouth 3 (three) times daily as needed.     spironolactone 25 MG tablet  Commonly known as:  ALDACTONE  Take 1 tablet (25 mg total) by mouth daily.       No Known Allergies     Follow-up Information    Follow up with [facility] .   Specialty:  Skilled Nursing Facility   Contact information:   [address] [place] [address] [phone]       The results of significant diagnostics from this hospitalization (including imaging, microbiology, ancillary and laboratory) are listed below for reference.    Significant Diagnostic Studies: Ct Abdomen Pelvis Wo Contrast  09/01/2015  CLINICAL DATA:  Inpatient. Acute constipation. Abdominal distention and bloating. Cirrhosis. EXAM: CT ABDOMEN AND PELVIS WITHOUT CONTRAST TECHNIQUE: Multidetector CT imaging of the abdomen and pelvis was performed following the standard protocol without IV contrast. COMPARISON:  Abdominal radiograph from earlier today. 05/18/2012 CT abdomen/ pelvis. FINDINGS: Images are motion degraded. Lower chest: Small layering left pleural effusion . Mild dependent atelectasis in the lingula and  both lower lobes. Coronary atherosclerosis. Hepatobiliary: Diffuse hepatic steatosis. Diffusely irregular liver surface, in keeping with cirrhosis, unchanged. No gross liver mass. There is layering dense material in the nondistended gallbladder, likely representing vicarious excretion of recently administered IV contrast. No gallbladder wall thickening. No biliary ductal dilatation. Pancreas: Normal, with no mass or duct dilation. Spleen: Normal size. No mass. Adrenals/Urinary Tract: Normal adrenals. Mild fullness of the central renal collecting systems bilaterally, without overt hydronephrosis, probably due to the prominently distended urinary bladder. No bladder wall thickening. No urolithiasis. No contour deforming renal mass. Stomach/Bowel: Grossly normal stomach. Normal caliber small bowel with no small bowel wall thickening. Normal appendix. Oral contrast progresses to the ascending colon. There is prominent gaseous distention of the cecum, ascending colon, hepatic flexure of the colon and transverse colon. There is a gradual transition to a normal caliber descending colon. Mild gaseous distention of the proximal sigmoid colon. Collapsed distal sigmoid colon and rectum. No large bowel wall thickening or pericolonic fat stranding. Tiny gas bubbles layering in the cecum and appendix are favored to represent liquid stool, with no definite pneumatosis.  No colonic diverticulosis. Rectal drain is in place with the tip in the lower rectum. Vascular/Lymphatic: Atherosclerotic nonaneurysmal abdominal aorta. No pathologically enlarged lymph nodes in the abdomen or pelvis. Reproductive: Mild prostatomegaly. Nonspecific internal prostatic calcifications. Other: No pneumoperitoneum. Small volume ascites predominantly perihepatic. Small left inguinal hernia containing fat and ascitic fluid . Musculoskeletal: No aggressive appearing focal osseous lesions. There is apparent fragmentation of the visualized lower T8 vertebral  body with associated paraspinal soft tissue swelling. Stable chronic moderate T12 vertebral compression fracture. Stable chronic mild L1 vertebral compression fracture. Mild compression fracture of the L3 vertebral body with large Schmorl's nodes, which is new since 05/18/2012. Moderate degenerative changes in the visualized thoracolumbar spine. IMPRESSION: 1. Moderate adynamic ileus of the colon. No evidence of bowel obstruction. 2. Apparent fragmentation of the visualized lower T8 vertebral body with associated paraspinal soft tissue swelling, suggesting an acute incompletely evaluated process such as a compression fracture. Dedicated thoracic spine radiographs or thoracic spine CT or MRI are advised. 3. Mild L3 vertebral compression fracture with large Schmorl's nodes, of uncertain chronicity, new since 05/18/2012. Stable chronic moderate T12 and mild L1 vertebral compression fractures. 4. Cirrhosis. Diffuse hepatic steatosis. No gross liver mass. Small volume ascites. 5. Distended bladder with fullness of the renal collecting systems without overt hydronephrosis. Mild prostatomegaly. Please exclude bladder outlet obstruction clinically. 6. Small layering left pleural effusion. 7. Coronary atherosclerosis. 8. Small left inguinal hernia. Electronically Signed   By: Delbert Phenix M.D.   On: 09/01/2015 19:23   Dg Chest 2 View  08/27/2015  CLINICAL DATA:  Shortness of breath and chest pain EXAM: CHEST  2 VIEW COMPARISON:  August 03, 2015 and June 01, 2015 FINDINGS: There is no edema or consolidation. The heart size and pulmonary vascularity are normal. No adenopathy. There is stable anterior wedging of the T12 and L1 vertebral bodies. There is anterior wedging of the T8 vertebral body which was not present on prior study from approximately 3 months prior. There is a stent in the left anterior descending coronary artery. IMPRESSION: No edema or consolidation. Anterior wedging of the T8 vertebral body, a finding  not present 3 months prior. Anterior wedging of the T12 and L1 vertebral bodies is stable. There is a stent in the left anterior descending coronary artery. Electronically Signed   By: Bretta Bang III M.D.   On: 08/27/2015 08:20   Dg Abd 1 View  09/01/2015  CLINICAL DATA:  Abdominal distention. History of multiple medical problems including cirrhosis, GI bleed and renal insufficiency. EXAM: ABDOMEN - 1 VIEW COMPARISON:  Radiographs 08/27/2015.  CT 05/18/2012. FINDINGS: Portions of the abdomen are excluded from this single supine portable radiograph. There is moderate diffuse distention of the colon. There may be mild small bowel distension as well. There is no supine evidence of free intraperitoneal air. No definite signs of recurrent ascites are seen by the current examination. There are no suspicious abdominal calcifications. Compression deformities at T12, L1 and L3 appear grossly unchanged. IMPRESSION: Diffuse bowel distention, primarily appearing to reflect colon. Evaluation is limited by supine radiography, and distal colonic obstruction cannot be excluded. Consider radiographic follow up or CT for further evaluation. Electronically Signed   By: Carey Bullocks M.D.   On: 09/01/2015 12:10   Mr Brain Wo Contrast  09/06/2015  CLINICAL DATA:  New onset RIGHT leg weakness today, assess for mass or infarct. History of COPD, hypertension, cirrhosis, alcohol and substance abuse. EXAM: MRI HEAD WITHOUT CONTRAST TECHNIQUE: Multiplanar, multiecho pulse sequences  of the brain and surrounding structures were obtained without intravenous contrast. Patient unable to receive contrast due to GFR of 34. COMPARISON:  CT head Dec 04, 2014 FINDINGS: Sequences are mild to moderately motion degraded. The ventricles and sulci are mildly proportionally prominent for age patient's age. No abnormal parenchymal signal, mass lesions, mass effect. Patchy supratentorial white matter T2 hyperintensities. No reduced diffusion to  suggest acute ischemia. Subcentimeter faint RIGHT mesial frontal lobe susceptibility artifact, nonspecific. No abnormal extra-axial fluid collections. No extra-axial masses though, contrast enhanced sequences would be more sensitive. Normal major intracranial vascular flow voids seen at the skull base. Ocular globes and orbital contents are unremarkable though not tailored for evaluation. No abnormal sellar expansion. No suspicious calvarial bone marrow signal. Craniocervical junction maintained. Visualized paranasal sinuses and mastoid air cells are well-aerated. Patient is edentulous. IMPRESSION: No acute intracranial process on this motion degraded examination. Mild global parenchymal brain volume loss for age. Mild chronic small vessel ischemic disease. Electronically Signed   By: Awilda Metro M.D.   On: 09/06/2015 00:49   Mr Cervical Spine Wo Contrast  09/07/2015  CLINICAL DATA:  70 year old hypertensive alcoholic male of bilateral leg weakness. Staph aureus septicemia on treatment. Bilateral leg weakness. Renal failure. Anemia. Subsequent encounter. EXAM: MRI THORACIC SPINE WITHOUT CONTRAST MRI CERVICAL SPINE WITHOUT CONTRAST TECHNIQUE: Multiplanar, multisequence MR imaging of the cervical spine was performed. No intravenous contrast was administered. Multiplanar, multisequence MR imaging of the thoracic spine was performed. No intravenous contrast was administered. COMPARISON:  09/05/2015 lumbar spine MR. 09/01/2015 CT abdomen and pelvis. 12/04/2014 cervical spine CT. FINDINGS: MR CERVICAL SPINE Exam is motion degraded. Diffuse decreased signal intensity of bone marrow may be related to patient's anemia. Fracture of the anterior inferior aspect of the C2 vertebral body of indeterminate age. Mild edema surrounding the fracture site suggesting that fracture has not completely healed radiographically. Minimal incongruent at this level on the 12/04/2014 exam and therefore fracture may be chronic although  not completely healed. No adjacent prevertebral edema/hematoma as expected with an acute fracture. Nonspecific edema within paraspinal musculature C3 through C5 level on the right. This may be reflective result of acute soft tissue injury or myositis. Given the below described findings, infection not excluded although felt to be less likely consideration. Intracranial atrophy. Cervical medullary junction unremarkable. No focal cervical cord signal abnormality. C2-3: Minimal narrowing ventral thecal sac. Minimal foraminal narrowing. C3-4: Facet degenerative changes. Minimal anterior slip C3. Bulge. Narrowing ventral thecal sac. Mild bilateral foraminal narrowing. C4-5: Bulge/broad-based protrusion. Buckling posterior ligaments. Moderate spinal stenosis with circumferential moderate cord flattening. Uncinate hypertrophy and facet degenerative changes. Moderate bilateral foraminal narrowing. C5-6: Broad-based disc osteophyte complex. Spinal stenosis with mild cord flattening. Uncinate hypertrophy and facet degenerative changes. Moderate bilateral foraminal narrowing. C6-7: Facet degenerative changes greater on the left. Mild anterior slip C6. Disc osteophyte complex greater on left. Flattening of the cord greater on left. Facet degenerative changes and uncinate hypertrophy with moderate right-sided and marked left-sided foraminal narrowing. C7-T1: Remote Schmorl's node deformity T1. Facet degenerative changes. No significant spinal stenosis. Minimal foraminal narrowing. MR THORACIC SPINE Patient was not able to complete second side of axial imaging. Exam is motion degraded. Baseline decreased signal intensity of bone marrow which may be related to patient's underlying anemia. Acute T8 compression fracture with 60% loss of height. Mild retropulsion posterior inferior aspect of the compressed vertebra. Edema extends into posterior elements. Surrounding soft tissue/fluid collection. Of note the is an epidural collection  which extends from the  upper T9 level to the mid T5 level. This is greatest in a right posterior lateral position at the T5 and T6 level, greatest right lateral position at the T8-9 level and greatest in a ventral right paracentral position at the upper T9 level. This causes mass effect upon the adjacent cord which is displaced anteriorly and to left at the T5 and T6 level. Suggestion of increased signal within the compress cord which may represent edema. Etiology of the epidural process is indeterminate. In alcoholic patient, compression fracture of T8 may be related to recent fall with epidural component related to blood however patient has Staph Aures septicemia and therefore epidural abscess is also consideration. T8-9 bilateral foraminal narrowing. Retropulsion compressed T8 vertebral body contributes to spinal stenosis and cord flattening. Remote Schmorl's node deformity superior endplate T1. Remote anterior wedge compression deformity involving superior endplate T12 with 16% loss of height anteriorly and mild retropulsion posterior superior aspect with narrowing of the ventral aspect of thecal sac and mild flattening of the ventral cord which is slightly posteriorly displaced. Remote L1 superior endplate compression fracture with 20% loss of height. IMPRESSION: MR THORACIC SPINE Patient was not able to complete second side of axial imaging. Exam is motion degraded. Baseline decreased signal intensity of bone marrow which may be related to patient's underlying anemia. Acute T8 compression fracture with 60% loss of height. Mild retropulsion posterior inferior aspect of the compressed vertebra. Edema extends into posterior elements. Surrounding soft tissue/fluid collection. Spinal stenosis with mild flattening of the adjacent cord. Bilateral T8-9 foraminal narrowing. Epidural collection extends from the upper T9 level to the mid T5 level. This is greatest in a right posterior lateral position at the T5 and T6  level, greatest right lateral position at the T8-9 level and greatest in a ventral right paracentral position at the upper T9 level. This causes mass effect upon the adjacent cord which is displaced anteriorly and to left at the T5 and T6 level. Suggestion of increased signal within the compress cord which may represent edema. Etiology of the epidural process is indeterminate. In alcoholic patient, compression fracture of T8 may be related to recent fall with epidural component related to blood however patient has Staph Aures septicemia and therefore epidural abscess is also consideration. Remote Schmorl's node deformity superior endplate T1. Remote anterior wedge compression deformity involving superior endplate T12 with 10% loss of height anteriorly and mild retropulsion posterior superior aspect with narrowing of the ventral aspect of thecal sac and mild flattening of the ventral cord which is slightly posteriorly displaced. Remote L1 superior endplate compression fracture with 20% loss of height. MR CERVICAL SPINE Exam is motion degraded. Diffuse decreased signal intensity of bone marrow may be related to patient's anemia. Fracture of the anterior inferior aspect of the C2 vertebral body of indeterminate age. Mild edema surrounding the fracture site suggesting that fracture has not completely healed radiographically. Minimal incongruent at this level on the 12/04/2014 exam and therefore fracture may be chronic although not completely healed. No adjacent prevertebral edema/hematoma as expected with an acute fracture. Nonspecific edema within paraspinal musculature C3 through C5 level on the right. This may be reflective result of acute soft tissue injury or myositis. Given the above described findings, infection not excluded although felt to be less likely consideration. C3-4 bulge. Narrowing ventral thecal sac. Mild bilateral foraminal narrowing. C4-5 multifactorial moderate spinal stenosis with circumferential  moderate cord flattening. Moderate bilateral foraminal narrowing. C5-6 broad-based disc osteophyte complex. Spinal stenosis with mild cord flattening.  Uncinate hypertrophy and facet degenerative changes. Moderate bilateral foraminal narrowing. C6-7 facet degenerative changes greater on the left. Mild anterior slip C6. Disc osteophyte complex greater on left. Flattening of the cord greater on left. Facet degenerative changes and uncinate hypertrophy with moderate right-sided and marked left-sided foraminal narrowing. These results were called by telephone at the time of interpretation on 09/07/2015 at 7:56 am to Dr. Janna Arch who verbally acknowledged these results. Electronically Signed   By: Lacy Duverney M.D.   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 Staph aureus septicemia on treatment. Bilateral leg weakness. Renal failure. Anemia. Subsequent encounter. EXAM: MRI THORACIC SPINE WITHOUT CONTRAST MRI CERVICAL SPINE WITHOUT CONTRAST TECHNIQUE: Multiplanar, multisequence MR imaging of the cervical spine was performed. No intravenous contrast was administered. Multiplanar, multisequence MR imaging of the thoracic spine was performed. No intravenous contrast was administered. COMPARISON:  09/05/2015 lumbar spine MR. 09/01/2015 CT abdomen and pelvis. 12/04/2014 cervical spine CT. FINDINGS: MR CERVICAL SPINE Exam is motion degraded. Diffuse decreased signal intensity of bone marrow may be related to patient's anemia. Fracture of the anterior inferior aspect of the C2 vertebral body of indeterminate age. Mild edema surrounding the fracture site suggesting that fracture has not completely healed radiographically. Minimal incongruent at this level on the 12/04/2014 exam and therefore fracture may be chronic although not completely healed. No adjacent prevertebral edema/hematoma as expected with an acute fracture.  Nonspecific edema within paraspinal musculature C3 through C5 level on the right. This may be reflective result of acute soft tissue injury or myositis. Given the below described findings, infection not excluded although felt to be less likely consideration. Intracranial atrophy. Cervical medullary junction unremarkable. No focal cervical cord signal abnormality. C2-3: Minimal narrowing ventral thecal sac. Minimal foraminal narrowing. C3-4: Facet degenerative changes. Minimal anterior slip C3. Bulge. Narrowing ventral thecal sac. Mild bilateral foraminal narrowing. C4-5: Bulge/broad-based protrusion. Buckling posterior ligaments. Moderate spinal stenosis with circumferential moderate cord flattening. Uncinate hypertrophy and facet degenerative changes. Moderate bilateral foraminal narrowing. C5-6: Broad-based disc osteophyte complex. Spinal stenosis with mild cord flattening. Uncinate hypertrophy and facet degenerative changes. Moderate bilateral foraminal narrowing. C6-7: Facet degenerative changes greater on the left. Mild anterior slip C6. Disc osteophyte complex greater on left. Flattening of the cord greater on left. Facet degenerative changes and uncinate hypertrophy with moderate right-sided and marked left-sided foraminal narrowing. C7-T1: Remote Schmorl's node deformity T1. Facet degenerative changes. No significant spinal stenosis. Minimal foraminal narrowing. MR THORACIC SPINE Patient was not able to complete second side of axial imaging. Exam is motion degraded. Baseline decreased signal intensity of bone marrow which may be related to patient's underlying anemia. Acute T8 compression fracture with 60% loss of height. Mild retropulsion posterior inferior aspect of the compressed vertebra. Edema extends into posterior elements. Surrounding soft tissue/fluid collection. Of note the is an epidural collection which extends from the upper T9 level to the mid T5 level. This is greatest in a right posterior  lateral position at the T5 and T6 level, greatest right lateral position at the T8-9 level and greatest in a ventral right paracentral position at the upper T9 level. This causes mass effect upon the adjacent cord which is displaced anteriorly and to left at the T5 and T6 level. Suggestion of increased signal within the compress cord which may represent edema. Etiology of the epidural process is indeterminate. In alcoholic patient, compression fracture of T8 may be related  to recent fall with epidural component related to blood however patient has Staph Aures septicemia and therefore epidural abscess is also consideration. T8-9 bilateral foraminal narrowing. Retropulsion compressed T8 vertebral body contributes to spinal stenosis and cord flattening. Remote Schmorl's node deformity superior endplate T1. Remote anterior wedge compression deformity involving superior endplate T12 with 21% loss of height anteriorly and mild retropulsion posterior superior aspect with narrowing of the ventral aspect of thecal sac and mild flattening of the ventral cord which is slightly posteriorly displaced. Remote L1 superior endplate compression fracture with 20% loss of height. IMPRESSION: MR THORACIC SPINE Patient was not able to complete second side of axial imaging. Exam is motion degraded. Baseline decreased signal intensity of bone marrow which may be related to patient's underlying anemia. Acute T8 compression fracture with 60% loss of height. Mild retropulsion posterior inferior aspect of the compressed vertebra. Edema extends into posterior elements. Surrounding soft tissue/fluid collection. Spinal stenosis with mild flattening of the adjacent cord. Bilateral T8-9 foraminal narrowing. Epidural collection extends from the upper T9 level to the mid T5 level. This is greatest in a right posterior lateral position at the T5 and T6 level, greatest right lateral position at the T8-9 level and greatest in a ventral right  paracentral position at the upper T9 level. This causes mass effect upon the adjacent cord which is displaced anteriorly and to left at the T5 and T6 level. Suggestion of increased signal within the compress cord which may represent edema. Etiology of the epidural process is indeterminate. In alcoholic patient, compression fracture of T8 may be related to recent fall with epidural component related to blood however patient has Staph Aures septicemia and therefore epidural abscess is also consideration. Remote Schmorl's node deformity superior endplate T1. Remote anterior wedge compression deformity involving superior endplate T12 with 30% loss of height anteriorly and mild retropulsion posterior superior aspect with narrowing of the ventral aspect of thecal sac and mild flattening of the ventral cord which is slightly posteriorly displaced. Remote L1 superior endplate compression fracture with 20% loss of height. MR CERVICAL SPINE Exam is motion degraded. Diffuse decreased signal intensity of bone marrow may be related to patient's anemia. Fracture of the anterior inferior aspect of the C2 vertebral body of indeterminate age. Mild edema surrounding the fracture site suggesting that fracture has not completely healed radiographically. Minimal incongruent at this level on the 12/04/2014 exam and therefore fracture may be chronic although not completely healed. No adjacent prevertebral edema/hematoma as expected with an acute fracture. Nonspecific edema within paraspinal musculature C3 through C5 level on the right. This may be reflective result of acute soft tissue injury or myositis. Given the above described findings, infection not excluded although felt to be less likely consideration. C3-4 bulge. Narrowing ventral thecal sac. Mild bilateral foraminal narrowing. C4-5 multifactorial moderate spinal stenosis with circumferential moderate cord flattening. Moderate bilateral foraminal narrowing. C5-6 broad-based disc  osteophyte complex. Spinal stenosis with mild cord flattening. Uncinate hypertrophy and facet degenerative changes. Moderate bilateral foraminal narrowing. C6-7 facet degenerative changes greater on the left. Mild anterior slip C6. Disc osteophyte complex greater on left. Flattening of the cord greater on left. Facet degenerative changes and uncinate hypertrophy with moderate right-sided and marked left-sided foraminal narrowing. These results were called by telephone at the time of interpretation on 09/07/2015 at 7:56 am to Dr. Janna Arch who verbally acknowledged these results. Electronically Signed   By: Lacy Duverney M.D.   On: 09/07/2015 08:26   Mr Lumbar Spine Wo Contrast  09/06/2015  CLINICAL DATA:  New onset RIGHT leg weakness today, assess for mass or infarct. History of COPD, hypertension, cirrhosis, alcohol and substance abuse. EXAM: MRI LUMBAR SPINE WITHOUT CONTRAST TECHNIQUE: Multiplanar, multisequence MR imaging of the lumbar spine was performed. No intravenous contrast was administered. No contrast administered due to GFR of 34. COMPARISON:  CT abdomen and pelvis August 31, 2005 FINDINGS: Sagittal sequences are mildly motion degraded, axial sequences are moderately motion degraded. Using the reference level of the last well-formed intervertebral disc as L5-S1, moderate chronic T12 burst fracture, mild chronic L1 compression fracture. Moderate to severe L3 central height loss, with L3 superior and inferior endplate large Schmorl's nodes. Mild to moderate L5 superior endplate compression fracture with Schmorl's node. Additional chronic Schmorl's nodes. Mild L5-S1 disc height loss, with slight desiccation the lower lumbar discs. Old the level mild subacute to chronic discogenic endplate changes without convincing STIR signal abnormality to suggest acute osseous process. Subcentimeter S2 probable atypical hemangioma. Conus medullaris terminates at L1. Limited assessment of cauda equina due to motion. Mild  epidural lipomatosis. Moderate symmetric paraspinal muscle T2 bright signal without atrophy. T11-12: Small amount of retropulsed bony fragments. Mild canal stenosis without neural foraminal narrowing. T12-L1: No significant disc bulge. Mild facet arthropathy and ligamentum flavum redundancy without canal stenosis or neural foraminal narrowing. L1-2: No significant disc bulge. Mild facet arthropathy and ligamentum flavum redundancy without canal stenosis or neural foraminal narrowing. L2-3: Small broad-based disc bulge, mild facet arthropathy and ligamentum flavum redundancy. Mild canal stenosis and partial effacement of thecal sac due to epidural fat. No neural foraminal narrowing. L3-4: Small broad-based disc osteophyte complex, mild facet arthropathy and ligamentum flavum redundancy. Mild canal stenosis and partial effacement of the thecal sac due to epidural fat. Moderate RIGHT, mild LEFT neural foraminal narrowing. L4-5: Small broad-based disc bulge. Moderate facet arthropathy and ligamentum flavum redundancy. No canal stenosis, partial effacement thecal sac due to epidural fat. Mild to moderate LEFT neural foraminal narrowing. L5-S1: Small broad-based disc bulge. Moderate facet arthropathy and ligamentum flavum redundancy without canal stenosis. Moderate RIGHT neural foraminal narrowing. IMPRESSION: Motion degraded examination. Degenerative change of the lumbar spine, superimposed on of epidural lipomatosis without definite epidural abscess on noncontrast examination. Multiple old fractures including moderate to severe L3 compression fracture. Mild canal stenosis L2-3 and L3-4. Neural foraminal narrowing L3-4 through L5-S1: Moderate on the RIGHT at L3-4 and L5-S1. Electronically Signed   By: Awilda Metro M.D.   On: 09/06/2015 01:11   US Venous Img Lower Unilateral Right  08/27/2015  CLINICAL DATA:  Right lower extremity swelling. Right knee pain. Status post fall 3 weeks ago. EXAM: RIGHT LOWER EXTREMITY  VENOUS DOPPLER ULTRASOUND TECHNIQUE: Gray-scale sonography with graded compression, as well as color Doppler and duplex ultrasound were performed to evaluate the lower extremity deep venous systems from the level of the common femoral vein and including the common femoral, femoral, profunda femoral, popliteal and calf veins including the posterior tibial, peroneal and gastrocnemius veins when visible. The superficial great saphenous vein was also interrogated. Spectral Doppler was utilized to evaluate flow at rest and with distal augmentation maneuvers in the common femoral, femoral and popliteal veins. COMPARISON:  None. FINDINGS: Contralateral Common Femoral Vein: Respiratory phasicity is normal and symmetric with the symptomatic side. No evidence of thrombus. Normal compressibility. Common Femoral Vein: No evidence of thrombus. Normal compressibility, respiratory phasicity and response to augmentation. Saphenofemoral Junction: No evidence of thrombus. Normal compressibility and flow on color Doppler imaging. Profunda Femoral Vein: No  evidence of thrombus. Normal compressibility and flow on color Doppler imaging. Femoral Vein: No evidence of thrombus. Normal compressibility, respiratory phasicity and response to augmentation. Popliteal Vein: No evidence of thrombus. Normal compressibility, respiratory phasicity and response to augmentation. Calf Veins: No evidence of thrombus. Normal compressibility and flow on color Doppler imaging. Superficial Great Saphenous Vein: No evidence of thrombus. Normal compressibility and flow on color Doppler imaging. Venous Reflux:  None. Other Findings:  None. IMPRESSION: No evidence of deep venous thrombosis. Electronically Signed   By: Elige Ko   On: 08/27/2015 09:53   Dg Chest Port 1 View  08/28/2015  CLINICAL DATA:  Fever. Hypertension, COPD, substance abuse. Alcohol dependence. EXAM: PORTABLE CHEST 1 VIEW COMPARISON:  08/27/2015 FINDINGS: Platelike atelectasis in the  lingula. Airspace disease in the right lung base and patchy opacity in the right upper lobe concerning for pneumonia. Heart is borderline in size. No effusions. No acute bony abnormality. IMPRESSION: Patchy right lower lobe and right upper lobe airspace disease concerning for pneumonia. Lingular atelectasis. Borderline heart size. Electronically Signed   By: Charlett Nose M.D.   On: 08/28/2015 18:05   Dg Knee Complete 4 Views Right  08/27/2015  CLINICAL DATA:  Recent fall with a right knee injury. Pain. Initial encounter. EXAM: RIGHT KNEE - COMPLETE 4+ VIEW COMPARISON:  None. FINDINGS: No acute bony or joint abnormality is identified. A loose body projects over the posterior aspect of the lateral compartment and measures 1.2 cm in diameter. Small joint effusion is noted. Atherosclerosis is identified. IMPRESSION: No acute abnormality. Loose body posterior aspect of the lateral compartment. Small joint effusion. Electronically Signed   By: Drusilla Kanner M.D.   On: 08/27/2015 14:00   Dg Abd 2 Views  08/27/2015  CLINICAL DATA:  Severe abdominal pain.  History of cirrhosis. EXAM: ABDOMEN - 2 VIEW COMPARISON:  Abdominal ultrasound 07/22/2015 FINDINGS: The bowel gas pattern is normal. There is no evidence of free air. No evidence of organomegaly radiographically. No radio-opaque calculi or other significant radiographic abnormality is seen. IMPRESSION: Negative. Electronically Signed   By: Ted Mcalpine M.D.   On: 08/27/2015 10:50    Microbiology: Recent Results (from the past 240 hour(s))  Culture, blood (single)     Status: None   Collection Time: 08/28/15  5:30 PM  Result Value Ref Range Status   Specimen Description BLOOD RIGHT HAND  Final   Special Requests BOTTLES DRAWN AEROBIC AND ANAEROBIC 5CC EACH  Final   Culture  Setup Time   Final    GRAM POSITIVE COCCI IN CLUSTERS IN BOTH AEROBIC AND ANAEROBIC BOTTLES CRITICAL RESULT CALLED TO, READ BACK BY AND VERIFIED WITH: DICKERSON,M AT 1610 ON  08/29/2015 BY WOODS,M    Culture   Final    METHICILLIN RESISTANT STAPHYLOCOCCUS AUREUS Performed at [facility]    Report Status 08/31/2015 FINAL  Final   Organism ID, Bacteria METHICILLIN RESISTANT STAPHYLOCOCCUS AUREUS  Final      Susceptibility   Methicillin resistant staphylococcus aureus - MIC*    CIPROFLOXACIN >=8 RESISTANT Resistant     ERYTHROMYCIN 0.5 SENSITIVE Sensitive     GENTAMICIN <=0.5 SENSITIVE Sensitive     OXACILLIN >=4 RESISTANT Resistant     TETRACYCLINE <=1 SENSITIVE Sensitive     VANCOMYCIN 1 SENSITIVE Sensitive     TRIMETH/SULFA >=320 RESISTANT Resistant     CLINDAMYCIN <=0.25 SENSITIVE Sensitive     RIFAMPIN <=0.5 SENSITIVE Sensitive     Inducible Clindamycin NEGATIVE Sensitive     *  METHICILLIN RESISTANT STAPHYLOCOCCUS AUREUS  Culture, blood (Routine X 2) w Reflex to ID Panel     Status: None   Collection Time: 09/01/15 12:20 PM  Result Value Ref Range Status   Specimen Description BLOOD RIGHT ARM  Final   Special Requests   Final    BOTTLES DRAWN AEROBIC AND ANAEROBIC 14CC EACH  IMMUNE:COMPROMISED   Culture NO GROWTH 5 DAYS  Final   Report Status 09/06/2015 FINAL  Final  Culture, blood (Routine X 2) w Reflex to ID Panel     Status: None   Collection Time: 09/01/15 12:30 PM  Result Value Ref Range Status   Specimen Description BLOOD LEFT ARM  Final   Special Requests   Final    BOTTLES DRAWN AEROBIC AND ANAEROBIC 12CC EACH  IMMUNE:COMPROMISED   Culture NO GROWTH 5 DAYS  Final   Report Status 09/06/2015 FINAL  Final     Labs: Basic Metabolic Panel:  Recent Labs Lab 09/02/15 0544 09/03/15 0621 09/05/15 0702 09/06/15 0547 09/07/15 0546  NA 128* 127* 129* 131* 131*  K 3.7 4.7 3.9 4.3 4.2  CL 98* 98* 103 105 104  CO2 24 22 20* 21* 22  GLUCOSE 110* 132* 103* 107* 101*  BUN 61* 72* 62* 57* 49*  CREATININE 2.38* 2.62* 1.94* 1.65* 1.55*  CALCIUM 7.1* 6.9* 7.0* 7.2* 7.3*   Liver Function Tests: No results for input(s): AST, ALT,  ALKPHOS, BILITOT, PROT, ALBUMIN in the last 168 hours. No results for input(s): LIPASE, AMYLASE in the last 168 hours.  Recent Labs Lab 09/01/15 1248 09/04/15 0618  AMMONIA 44* 39*   CBC:  Recent Labs Lab 09/01/15 0533 09/02/15 0544 09/03/15 0621 09/04/15 0618 09/05/15 1351 09/06/15 0547  WBC 13.3* 15.6* 16.0* 13.6* 11.6* 11.5*  NEUTROABS 8.4* 10.7* 12.3* 9.9*  --  8.3*  HGB 9.6* 9.9* 9.3* 8.9* 9.3* 9.0*  HCT 27.8* 29.2* 26.9* 25.4* 27.5* 26.7*  MCV 93.9 93.3 93.1 93.4 95.2 95.4  PLT 64* 95* 135* 159 188 221   Cardiac Enzymes: No results for input(s): CKTOTAL, CKMB, CKMBINDEX, TROPONINI in the last 168 hours. BNP: BNP (last 3 results) No results for input(s): BNP in the last 8760 hours.  ProBNP (last 3 results) No results for input(s): PROBNP in the last 8760 hours.  CBG: No results for input(s): GLUCAP in the last 168 hours.     Signed:  Tacora Athanas Judie Petit  [facility] Pager: [phone] 09/07/2015, 1:38 PM

## 2015-09-07 NOTE — Transfer of Care (Addendum)
Immediate Anesthesia Transfer of Care Note  Patient: Roberto [name]  Procedure(s) Performed: Procedure(s): THORACIC FIVE-THORACIC ELEVEN LAMINECTOMY FOR EPIDURAL ABSCESS (N/A)  Patient Location: PACU  Anesthesia Type:General  Level of Consciousness: awake, patient cooperative and confused  Airway & Oxygen Therapy: Patient Spontanous Breathing and Patient connected to nasal cannula oxygen  Post-op Assessment: Report given to RN, Post -op Vital signs reviewed and stable and minimal movement bilateral lower extremities  Post vital signs: Reviewed and stable  Last Vitals:  Filed Vitals:   09/07/15 1339 09/07/15 1835  BP: 120/59 120/70  Pulse: 73 66  Temp: 36.7 C 36.5 C  Resp: 20 13    Complications: No apparent anesthesia complications

## 2015-09-07 NOTE — Anesthesia Preprocedure Evaluation (Addendum)
Anesthesia Evaluation  Patient identified by MRN, date of birth, ID band Patient awake    Reviewed: Allergy & Precautions, NPO status , Patient's Chart, lab work & pertinent test results  Airway Mallampati: II  TM Distance: >3 FB Neck ROM: Full    Dental no notable dental hx.    Pulmonary COPD, Current Smoker,    Pulmonary exam normal breath sounds clear to auscultation       Cardiovascular hypertension, Pt. on medications Normal cardiovascular exam Rhythm:Regular Rate:Normal     Neuro/Psych negative neurological ROS  negative psych ROS   GI/Hepatic negative GI ROS, (+) Cirrhosis     substance abuse  alcohol use and cocaine use,   Endo/Other  negative endocrine ROS  Renal/GU negative Renal ROS  negative genitourinary   Musculoskeletal negative musculoskeletal ROS (+)   Abdominal   Peds negative pediatric ROS (+)  Hematology  (+) anemia ,   Anesthesia Other Findings   Reproductive/Obstetrics negative OB ROS                             Anesthesia Physical Anesthesia Plan  ASA: III  Anesthesia Plan: General   Post-op Pain Management:    Induction: Intravenous  Airway Management Planned: Oral ETT  Additional Equipment: Arterial line  Intra-op Plan:   Post-operative Plan: Possible Post-op intubation/ventilation  Informed Consent: I have reviewed the patients History and Physical, chart, labs and discussed the procedure including the risks, benefits and alternatives for the proposed anesthesia with the patient or authorized representative who has indicated his/her understanding and acceptance.   Dental advisory given  Plan Discussed with: CRNA and Surgeon  Anesthesia Plan Comments:         Anesthesia Quick Evaluation

## 2015-09-07 NOTE — Progress Notes (Signed)
      [place] Antimicrobial Management Team Staphylococcus aureus bacteremia   Staphylococcus aureus bacteremia (SAB) is associated with a high rate of complications and mortality.  Specific aspects of clinical management are critical to optimizing the outcome of patients with SAB.  Therefore, the [facility] Antimicrobial Management Team [facility]) has initiated an intervention aimed at improving the management of SAB at [facility].  To do so, Infectious Diseases physicians are providing an evidence-based consult for the management of all patients with SAB.     Yes No Comments  Perform follow-up blood cultures (even if the patient is afebrile) to ensure clearance of bacteremia   BLOOD CULTURES DRAWN 09/01/15 NO GROWTH FINAL  Remove vascular catheter and obtain follow-up blood cultures after the removal of the catheter   OK to place central line  Perform echocardiography to evaluate for endocarditis (transthoracic ECHO is 40-50% sensitive, TEE is > 90% sensitive)   Please keep in mind, that neither test can definitively EXCLUDE endocarditis, and that should clinical suspicion remain high for endocarditis the patient should then still be treated with an "endocarditis" duration of therapy = 6 weeks  TTE limited,  I would NOT pursue TEE at this point     Consult electrophysiologist to evaluate implanted cardiac device (pacemaker, ICD)   NOT WITH AICD, PM TO MY KNOWLEDGE  Ensure source control   Have all abscesses been drained effectively? Have deep seeded infections (septic joints or osteomyelitis) had appropriate surgical debridement?  NOT CLEAR WHAT SOURCE IS THE LUNGS OR HER KNEE WHICH WAS THOUGHT TO BE POSSIBLY INFECTED  SHE HAD ASPIRATE OF THE KNEE BUT NO  CELL COUNT AND DIFFERENTIAL OR CULTURE FROM THE KNEE because Orthopedics felt it was not infected based on visual inspection and knee is reportedly better now       Investigate for "metastatic" sites of infection    Does the patient have ANY symptom or physical exam finding that would suggest a deeper infection (back or neck pain that may be suggestive of vertebral osteomyelitis or epidural abscess, muscle pain that could be a symptom of pyomyositis)?  Keep in mind that for deep seeded infections MRI imaging with contrast is preferred rather than other often insensitive tests such as plain x-rays, especially early in a patient's presentation.  Patient is hemiplegic and with NEW interpretation of L spine showing LUMBAR EPIDURAL ABSCESS. Pt transferring to [facility] for evaluation by Neurosurgery. I will let my partner Dr. Drue Second know pt will be coming to [facility]    Changed antibiotic therapy to [facility] for original worsening renal failure but desire to use drug that is active in the lungs, dose adjusted up again   Beta-lactam antibiotics are preferred for MSSA due to higher cure rates.   If on Vancomycin, goal trough should be 15 - 20 mcg/mL  Estimated duration of IV antibiotic therapy:  8 WEEKS   Consult case management for probably prolonged outpatient IV antibiotic therapy    Case discussed with Dr. Janna Arch.

## 2015-09-07 NOTE — Anesthesia Procedure Notes (Signed)
Procedure Name: Intubation Date/Time: 09/07/2015 7:47 PM Performed by: Arlice Colt B Pre-anesthesia Checklist: Patient identified, Emergency Drugs available, Suction available, Patient being monitored and Timeout performed Patient Re-evaluated:Patient Re-evaluated prior to inductionOxygen Delivery Method: Circle system utilized Preoxygenation: Pre-oxygenation with 100% oxygen Intubation Type: IV induction, Rapid sequence and Cricoid Pressure applied Laryngoscope Size: Mac and 3 Grade View: Grade I Tube type: Subglottic suction tube Tube size: 8.0 mm Number of attempts: 1 Airway Equipment and Method: Stylet Placement Confirmation: ETT inserted through vocal cords under direct vision,  positive ETCO2 and breath sounds checked- equal and bilateral Secured at: 22 cm Tube secured with: Tape Dental Injury: Teeth and Oropharynx as per pre-operative assessment

## 2015-09-07 NOTE — Care Management Note (Signed)
Case Management Note  Patient Details  Name: LORA GLOMSKI MRN: [id number] Date of Birth: [birth year]  Expected Discharge Date:                  Expected Discharge Plan:  Skilled Nursing Facility  In-House Referral:  Clinical Social Work  Discharge planning Services  CM Consult  Post Acute Care Choice:    Choice offered to:     DME Arranged:    DME Agency:     HH Arranged:    HH Agency:     Status of Service:  In process, will continue to follow  Medicare Important Message Given:  Yes Date Medicare IM Given:    Medicare IM give by:    Date Additional Medicare IM Given:    Additional Medicare Important Message give by:     If discussed at Long Length of Stay Meetings, dates discussed:  09/07/2015  Additional Comments: PT has recommended SNF and CSW is working with pt to determine placement. Potential transfer to another acute facility fo neurosurgical care. Will cont to follow.   Malcolm Metro, RN 09/07/2015, 12:53 PM

## 2015-09-07 NOTE — Consult Note (Signed)
CC:  Chief Complaint  Patient presents with  . Shortness of Breath    HPI: Roberto [name] is a 70 y.o. 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  He was transferred for neurosurgical evaluation.  PMH: Past Medical History  Diagnosis Date  . COPD (chronic obstructive pulmonary disease) (HCC)   . Hypertension   . Hepatic steatosis 12/14/2011  . Compression fracture of L1 lumbar vertebra (HCC) 12/14/2011  . C2 cervical fracture (HCC)   . Duodenal ulcer with hemorrhage 12/15/2011    Likely source of bleeding.per EGD; Dr. Karilyn Cota  . Esophageal varices without bleeding (HCC) 12/15/2011    per EGD  . Cirrhosis (HCC) 12/16/2011    per ultrasound  . Hx of substance abuse 12/17/2011  . 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 Hepatic encephalopathy (HCC)   . Helicobacter pylori antibody positive 12/19/2011  . Encephalopathy 05/18/2012  . UTI (urinary tract infection) 12/13/2011  . Ascites 05/18/2012    S/p paracentesis yielding 7880 mL  . Cirrhosis (HCC)   . Depression     PSH: Past Surgical History  Procedure Laterality Date  . Coronary angioplasty with stent placement      SH: Social History  Substance Use Topics  . Smoking status: Current Every Day Smoker -- 1.00 packs/day    Types: Cigarettes  . Smokeless tobacco: Current User     Comment: 1 1/2 pack a day  . Alcohol Use: Yes     Comment: drinking everyday.    MEDS: Prior to Admission medications   Medication Sig Start Date End Date Taking? Authorizing Provider  albuterol (PROVENTIL) (2.5 MG/3ML) 0.083% nebulizer solution Take 3 mLs (2.5 mg total) by nebulization every 6 (six) hours as needed for wheezing or shortness of breath. 06/10/15  Yes Oneta Rack, NP  diphenhydrAMINE (BENADRYL) 25 MG tablet  Take 1 tablet (25 mg total) by mouth at bedtime as needed for sleep. 06/19/15  Yes Marily Memos, MD  furosemide (LASIX) 20 MG tablet Take 20 mg by mouth daily. 08/17/15  Yes Historical Provider, MD  HYDROcodone-acetaminophen (NORCO/VICODIN) 5-325 MG tablet Take 1 tablet by mouth every 6 (six) hours as needed for severe pain. 08/03/15  Yes Lavera Guise, MD  hydroxypropyl methylcellulose / hypromellose (ISOPTO TEARS / GONIOVISC) 2.5 % ophthalmic solution Place 1 drop into both eyes as needed for dry eyes. Reported on 07/22/2015   Yes Historical Provider, MD  ibuprofen (ADVIL,MOTRIN) 200 MG tablet Take 800 mg by mouth every 6 (six) hours as needed for mild pain.   Yes Historical Provider, MD  lactulose (CHRONULAC) 10 GM/15ML solution Take 15 mLs by mouth 3 (three) times daily as needed. 07/22/15  Yes Historical Provider, MD  Menthol-Methyl Salicylate (BENGAY ARTHRITIS FORMULA EX) Apply 1 application topically 3 (three) times daily as needed.   Yes Historical Provider, MD  omeprazole (PRILOSEC) 20 MG capsule Take 20 mg by mouth daily.   Yes Historical Provider, MD  Oxycodone HCl 10 MG TABS Take 10 mg by mouth 3 (three) times daily as needed. 08/11/15  Yes Historical Provider, MD  spironolactone (ALDACTONE) 25 MG tablet Take 1 tablet (25 mg total) by mouth daily. 07/22/15  Yes Len Blalock, NP  chlordiazePOXIDE (LIBRIUM) 25 MG capsule  PO TID x 1D, then 25-50mg  PO BID X 1D, then 25-50mg  PO  QD X 1D Patient not taking: Reported on 08/27/2015 06/19/15   Marily Memos, MD  Multiple Vitamin (MULTIVITAMIN WITH MINERALS) TABS tablet Take 1 tablet by mouth daily. Patient not taking: Reported on 08/27/2015 06/10/15   Oneta Rack, NP    ALLERGY: No Known Allergies  ROS: ROS  NEUROLOGIC EXAM: Awake, alert, oriented Memory and concentration grossly intact Speech fluent, appropriate CN grossly intact Motor exam: Upper Extremities Deltoid Bicep Tricep Grip  Right 5/5 5/5 5/5 5/5  Left 5/5 5/5 5/5 5/5    Lower Extremity IP Quad PF DF EHL  Right 0/5 0/5 0/5 0/5 0/5  Left 0/5 0/5 0/5 0/5 0/5   Markedly diminished sensation BLE DTRs 3+ BLE  IMAGING: T8 spondylodiscitis, dorsal epidural abscess ~T5-8  IMPRESSION: - 70 y.o. 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  PLAN: - Emergency thoracic laminectomies for evacuation of epidural abscess. - I discussed with this patient that this is his only hope for neurological improvement but that he may not improve even with surgery.  He understands this and wishes to proceed. - Dexamethasone  IV now

## 2015-09-07 NOTE — Progress Notes (Signed)
Pt received from [facility] to 2C09. Pt HDS showing no s/s of resp distress.

## 2015-09-07 NOTE — Op Note (Signed)
08/27/2015 - 09/07/2015  10:43 PM  PATIENT:  Roberto [name]  70 y.o. male  PRE-OPERATIVE DIAGNOSIS:  Thoracic epidural abscess, paraparesis  POST-OPERATIVE DIAGNOSIS:  Same  PROCEDURE:  Thoracic 5-11 laminectomy for evacuation of epidural abscess  SURGEON:  Hulan Saas, MD  ASSISTANTS: None  ANESTHESIA:   General  DRAINS: Medium hemovac  SPECIMEN: Cultures, thoracic epidural abscess  INDICATION FOR PROCEDURE: 70 year old man with subacute onset of dense paraparesis who was found to have an epidural abscess from ~T5-T9. Patient understood the risks, benefits, and alternatives and potential outcomes and wished to proceed.  PROCEDURE DETAILS: After smooth induction of general endotracheal anesthesia the patient was turned prone on a [name] frame. His skin was wiped down with alcohol and then the planned surgical area was prepped and draped in the usual sterile fashion. The skin was infiltrated with lidocaine and Marcaine over the area of the planned surgical incision.  Using anatomic landmarks a linear incision was made over the midline from approximately T6-T8. Dissection was carried down to the tips of the spinous processes using monopolar cautery. Subperiosteal dissection was then carried out to expose the edges of the lamina. Using a Leksell the spinous processes were removed at these levels. Using a Kerrison punch a laminectomy was performed. I encountered pus in the epidural space eccentric to the right at T6 and a phlegmon in the epidural space eccentric to the right at T7 and T8. Cultures were taken of the pus and the phlegmon. I extended my exposure superiorly and inferiorly 1 level at a time until I did not encounter any more phlegmon. At T5 I encountered reached the upper extent of the epidural phlegmon. The lower extent was reached at T11.  I obtained hemostasis with a combination of bone wax to the bony edges and Surgi-Flo and thrombin-soaked Gelfoam in the epidural space. I  irrigated vigorously with bacitracin saline. I tunneled a medium Hemovac drain beneath the fascia and out the skin. I placed 1 g of vancomycin powder in the subfascial space. I then closed the wound in routine anatomic layers with interrupted Vicryl sutures. I injected Exparel on the paraspinous muscles and deep dermal tissues. The skin was closed with staples. A sterile dressing was applied. The patient was returned to the supine position on the stretcher. He was then taken to recovery.  PATIENT DISPOSITION: PACU then stepdown   Delay start of Pharmacological VTE agent (>24hrs) due to surgical blood loss or risk of bleeding:  Yes

## 2015-09-08 ENCOUNTER — Encounter (HOSPITAL_COMMUNITY): Payer: Self-pay | Admitting: Neurological Surgery

## 2015-09-08 ENCOUNTER — Ambulatory Visit (INDEPENDENT_AMBULATORY_CARE_PROVIDER_SITE_OTHER): Payer: Self-pay | Admitting: Internal Medicine

## 2015-09-08 DIAGNOSIS — E43 Unspecified severe protein-calorie malnutrition: Secondary | ICD-10-CM

## 2015-09-08 DIAGNOSIS — K7030 Alcoholic cirrhosis of liver without ascites: Secondary | ICD-10-CM

## 2015-09-08 DIAGNOSIS — G062 Extradural and subdural abscess, unspecified: Secondary | ICD-10-CM | POA: Diagnosis present

## 2015-09-08 DIAGNOSIS — F1020 Alcohol dependence, uncomplicated: Secondary | ICD-10-CM

## 2015-09-08 DIAGNOSIS — B9562 Methicillin resistant Staphylococcus aureus infection as the cause of diseases classified elsewhere: Secondary | ICD-10-CM

## 2015-09-08 DIAGNOSIS — N179 Acute kidney failure, unspecified: Secondary | ICD-10-CM

## 2015-09-08 DIAGNOSIS — F10230 Alcohol dependence with withdrawal, uncomplicated: Secondary | ICD-10-CM

## 2015-09-08 DIAGNOSIS — G061 Intraspinal abscess and granuloma: Secondary | ICD-10-CM

## 2015-09-08 DIAGNOSIS — Z9889 Other specified postprocedural states: Secondary | ICD-10-CM

## 2015-09-08 LAB — PREPARE FRESH FROZEN PLASMA
UNIT DIVISION: 0
UNIT DIVISION: 0

## 2015-09-08 LAB — BASIC METABOLIC PANEL
Anion gap: 5 (ref 5–15)
BUN: 44 mg/dL — ABNORMAL HIGH (ref 6–20)
CHLORIDE: 106 mmol/L (ref 101–111)
CO2: 21 mmol/L — ABNORMAL LOW (ref 22–32)
CREATININE: 1.69 mg/dL — AB (ref 0.61–1.24)
Calcium: 7.4 mg/dL — ABNORMAL LOW (ref 8.9–10.3)
GFR, EST AFRICAN AMERICAN: 47 mL/min — AB (ref >60)
GFR, EST NON AFRICAN AMERICAN: 40 mL/min — AB (ref >60)
Glucose, Bld: 172 mg/dL — ABNORMAL HIGH (ref 65–99)
POTASSIUM: 5.6 mmol/L — AB (ref 3.5–5.1)
SODIUM: 132 mmol/L — AB (ref 135–145)

## 2015-09-08 LAB — CBC
HCT: 22.3 % — ABNORMAL LOW (ref 39.0–52.0)
HEMOGLOBIN: 7.4 g/dL — AB (ref 13.0–17.0)
MCH: 31.2 pg (ref 26.0–34.0)
MCHC: 33.2 g/dL (ref 30.0–36.0)
MCV: 94.1 fL (ref 78.0–100.0)
PLATELETS: 222 10*3/uL (ref 150–400)
RBC: 2.37 MIL/uL — AB (ref 4.22–5.81)
RDW: 19.4 % — ABNORMAL HIGH (ref 11.5–15.5)
WBC: 11.5 10*3/uL — ABNORMAL HIGH (ref 4.0–10.5)

## 2015-09-08 LAB — MRSA PCR SCREENING: MRSA by PCR: POSITIVE — AB

## 2015-09-08 MED ORDER — HYDROCODONE-ACETAMINOPHEN 5-325 MG PO TABS
1.0000 | ORAL_TABLET | ORAL | Status: DC | PRN
  Administered 2015-09-11 – 2015-09-13 (×10): 2 via ORAL
  Filled 2015-09-08 (×11): qty 2

## 2015-09-08 MED ORDER — METHOCARBAMOL 500 MG PO TABS
500.0000 mg | ORAL_TABLET | Freq: Four times a day (QID) | ORAL | Status: DC | PRN
  Administered 2015-09-11 – 2015-09-13 (×6): 500 mg via ORAL
  Filled 2015-09-08 (×6): qty 1

## 2015-09-08 MED ORDER — CETYLPYRIDINIUM CHLORIDE 0.05 % MT LIQD
7.0000 mL | Freq: Two times a day (BID) | OROMUCOSAL | Status: DC
  Administered 2015-09-08 – 2015-09-13 (×9): 7 mL via OROMUCOSAL

## 2015-09-08 MED ORDER — SODIUM CHLORIDE 0.9 % IV SOLN: 250.0000 mL | INTRAVENOUS | Status: DC

## 2015-09-08 MED ORDER — VANCOMYCIN HCL 10 G IV SOLR
1500.0000 mg | Freq: Once | INTRAVENOUS | Status: AC
  Administered 2015-09-08: 1500 mg via INTRAVENOUS
  Filled 2015-09-08: qty 1500

## 2015-09-08 MED ORDER — HYDROMORPHONE 1 MG/ML IV SOLN: INTRAVENOUS | Status: DC

## 2015-09-08 MED ORDER — SODIUM CHLORIDE 0.9% FLUSH
3.0000 mL | INTRAVENOUS | Status: DC | PRN
  Administered 2015-09-11: 3 mL via INTRAVENOUS
  Filled 2015-09-08: qty 3

## 2015-09-08 MED ORDER — ACETAMINOPHEN 325 MG PO TABS: 650.0000 mg | ORAL_TABLET | ORAL | Status: DC | PRN

## 2015-09-08 MED ORDER — SODIUM CHLORIDE 0.9 % IV BOLUS (SEPSIS)
500.0000 mL | Freq: Once | INTRAVENOUS | Status: AC
  Administered 2015-09-08: 500 mL via INTRAVENOUS

## 2015-09-08 MED ORDER — NALOXONE HCL 0.4 MG/ML IJ SOLN: 0.4000 mg | INTRAMUSCULAR | Status: DC | PRN

## 2015-09-08 MED ORDER — SENNA 8.6 MG PO TABS
1.0000 | ORAL_TABLET | Freq: Two times a day (BID) | ORAL | Status: DC
  Administered 2015-09-08 – 2015-09-13 (×11): 8.6 mg via ORAL
  Filled 2015-09-08 (×11): qty 1

## 2015-09-08 MED ORDER — HYDROMORPHONE HCL 1 MG/ML IJ SOLN
0.5000 mg | INTRAMUSCULAR | Status: DC | PRN
  Administered 2015-09-08 – 2015-09-10 (×9): 1 mg via INTRAVENOUS
  Filled 2015-09-08 (×9): qty 1

## 2015-09-08 MED ORDER — DOCUSATE SODIUM 100 MG PO CAPS
100.0000 mg | ORAL_CAPSULE | Freq: Two times a day (BID) | ORAL | Status: DC
  Administered 2015-09-08 – 2015-09-13 (×11): 100 mg via ORAL
  Filled 2015-09-08 (×11): qty 1

## 2015-09-08 MED ORDER — ONDANSETRON HCL 4 MG/2ML IJ SOLN: 4.0000 mg | Freq: Four times a day (QID) | INTRAMUSCULAR | Status: DC | PRN

## 2015-09-08 MED ORDER — SODIUM CHLORIDE 0.9% FLUSH
3.0000 mL | Freq: Two times a day (BID) | INTRAVENOUS | Status: DC
  Administered 2015-09-08 – 2015-09-12 (×6): 3 mL via INTRAVENOUS

## 2015-09-08 MED ORDER — MENTHOL 3 MG MT LOZG: 1.0000 | LOZENGE | OROMUCOSAL | Status: DC | PRN

## 2015-09-08 MED ORDER — ZOLPIDEM TARTRATE 5 MG PO TABS
5.0000 mg | ORAL_TABLET | Freq: Every evening | ORAL | Status: DC | PRN
  Administered 2015-09-08 – 2015-09-12 (×4): 5 mg via ORAL
  Filled 2015-09-08 (×5): qty 1

## 2015-09-08 MED ORDER — CEFAZOLIN SODIUM 1-5 GM-% IV SOLN: 1.0000 g | Freq: Three times a day (TID) | INTRAVENOUS | Status: DC

## 2015-09-08 MED ORDER — METHOCARBAMOL 1000 MG/10ML IJ SOLN
500.0000 mg | Freq: Four times a day (QID) | INTRAMUSCULAR | Status: DC | PRN
  Filled 2015-09-08: qty 5

## 2015-09-08 MED ORDER — ACETAMINOPHEN 650 MG RE SUPP
650.0000 mg | RECTAL | Status: DC | PRN
Start: 2015-09-08 — End: 2015-09-13

## 2015-09-08 MED ORDER — SODIUM CHLORIDE 0.9 % IV SOLN
INTRAVENOUS | Status: DC
Start: 2015-09-08 — End: 2015-09-12
  Administered 2015-09-08: 1000 mL via INTRAVENOUS
  Administered 2015-09-09 (×2): via INTRAVENOUS

## 2015-09-08 MED ORDER — SODIUM CHLORIDE 0.9% FLUSH: 9.0000 mL | INTRAVENOUS | Status: DC | PRN

## 2015-09-08 MED ORDER — VANCOMYCIN HCL IN DEXTROSE 750-5 MG/150ML-% IV SOLN
750.0000 mg | Freq: Two times a day (BID) | INTRAVENOUS | Status: DC
  Administered 2015-09-09 – 2015-09-10 (×3): 750 mg via INTRAVENOUS
  Filled 2015-09-08 (×5): qty 150

## 2015-09-08 MED ORDER — FLEET ENEMA 7-19 GM/118ML RE ENEM
1.0000 | ENEMA | Freq: Once | RECTAL | Status: DC | PRN
  Filled 2015-09-08: qty 1

## 2015-09-08 MED ORDER — ONDANSETRON HCL 4 MG/2ML IJ SOLN: 4.0000 mg | INTRAMUSCULAR | Status: DC | PRN

## 2015-09-08 MED ORDER — PANTOPRAZOLE SODIUM 40 MG IV SOLR
40.0000 mg | Freq: Every day | INTRAVENOUS | Status: DC
  Administered 2015-09-08: 40 mg via INTRAVENOUS
  Filled 2015-09-08: qty 40

## 2015-09-08 MED ORDER — DIPHENHYDRAMINE HCL 12.5 MG/5ML PO ELIX
12.5000 mg | ORAL_SOLUTION | Freq: Four times a day (QID) | ORAL | Status: DC | PRN
  Filled 2015-09-08: qty 5

## 2015-09-08 MED ORDER — DIPHENHYDRAMINE HCL 50 MG/ML IJ SOLN: 12.5000 mg | Freq: Four times a day (QID) | INTRAMUSCULAR | Status: DC | PRN

## 2015-09-08 MED ORDER — SODIUM POLYSTYRENE SULFONATE 15 GM/60ML PO SUSP
15.0000 g | Freq: Once | ORAL | Status: AC
  Administered 2015-09-08: 15 g via ORAL
  Filled 2015-09-08: qty 60

## 2015-09-08 MED ORDER — BISACODYL 5 MG PO TBEC: 5.0000 mg | DELAYED_RELEASE_TABLET | Freq: Every day | ORAL | Status: DC | PRN

## 2015-09-08 MED ORDER — OXYCODONE-ACETAMINOPHEN 5-325 MG PO TABS
1.0000 | ORAL_TABLET | ORAL | Status: DC | PRN
  Administered 2015-09-10 – 2015-09-13 (×8): 2 via ORAL
  Filled 2015-09-08 (×8): qty 2

## 2015-09-08 MED ORDER — PHENOL 1.4 % MT LIQD
1.0000 | OROMUCOSAL | Status: DC | PRN
  Filled 2015-09-08: qty 177

## 2015-09-08 NOTE — Progress Notes (Signed)
No acute overnight events AVSS Awake and alert, confused Full strength upper extremities 1-2/5 DF and PF, otherwise 0/5 BLE Minimal neurological improvement from yesterday, but the fact that he has had any improvement is somewhat promising Keep drain for now Continue abx Would consult ID for further recs

## 2015-09-08 NOTE — Care Management Important Message (Signed)
Important Message  Patient Details  Name: LEMOND GRIFFEE MRN: [id number] Date of Birth: March 30, 1946   Medicare Important Message Given:  Yes    Rayvon Char 09/08/2015, 1:33 PMImportant Message  Patient Details  Name: KINGSON LOHMEYER MRN: [id number] Date of Birth: November 01, 1945   Medicare Important Message Given:  Yes    Francy Mcilvaine G 09/08/2015, 1:33 PM

## 2015-09-08 NOTE — Progress Notes (Signed)
PROGRESS NOTE  Roberto [name] PIR:[id number] DOB: [birth year] DOA: 08/27/2015 PCP: Isabella Stalling, MD  HPI/Recap of past 24 hours: Patient is a 70 year old male with past medical history of alcohol abuse and secondary cirrhosis initially admitted on 1/27 with alcohol withdrawal and swollen right knee concerning for septic joint.  Seen by orthopedic surgery and knee aspirated, right knee cultures grew out MRSA as did blood cultures and patient initially started on IV antibiotics.during this time, patient was also treated for alcohol withdrawal.  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  Upon workup, patient underwent MRI of the thoracic spine and he was found to have an epidural abscess. Patient transferred from [facility] to [facility] and seen by neurosurgery.  He underwent a T5-T11 laminectomy with evacuation of epidural abscess on the evening of 2/7. Cultures were sent. Of note, blood cultures from 21 noted clearing of bacteremia. Only other complication during patient's hospitalization was acute kidney injury possibly felt to be from vancomycin although renal function now normal.  This morning, postop day 1, patient states he's feeling better. Still unable to move right leg too much, but he feels like he has some increased strength in moving his toes.  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 Appreciate their assistance. Status post right knee joint aspiration and thoracic laminectomy with epidural evacuation of abscess. Restarted on IV vancomycin.neurosurgery following. Awaiting physical therapy evaluation Active Problems:   HTN (hypertension)colon monitoring blood pressures    Severe protein-calorie malnutrition (HCC)   ETOH abuse with secondary cirrhosis, hepatic encephalopathy and history of varices: Course  complicated by recent withdrawal, treated. Stable. Continue rifaximin.  Acute kidney injury: Currently creatinine stable at 1.7, although slightly above his baseline. Following labs closely  Code Status: full code   Family Communication: left message with son   Disposition Plan: likely inpatient for several more days, awaiting PT evaluation to determine disposition    Consultants:  Orthopedic surgery  Infectious disease  Neurosurgery  Gastroenterology  Procedures:  Status post laminectomy T5-T11 down to/7  Status post aspiration of knee joint done 2/1  Antibiotics:  IIV Rocephin 1/28-1/29  IV Zithromax 1/281/29  IV Zosyn 1/29-2/1  IV vancomycin 1/29-2/2, 2/8-present  IV Teflaro 2/2-2/8   Objective: BP 113/66 mmHg  Pulse 80  Temp(Src) 97.9 F (36.6 C) (Oral)  Resp 13  Ht 6' (1.829 m)  Wt 96.163 kg (212 lb)  BMI 28.75 kg/m2  SpO2 100%  Intake/Output Summary (Last 24 hours) at 09/08/15 1430 Last data filed at 09/08/15 1100  Gross per 24 hour  Intake   3967 ml  Output   2215 ml  Net   1752 ml   Filed Weights   08/27/15 0710  Weight: 96.163 kg (212 lb)    Exam:   General:  Alert and oriented 3,   Cardiovascular: regular rate and rhythm, S1-S2   Respiratory: clear to auscultation bilaterally   Abdomen: soft, nontender, nondistended, positive bowel sounds   Musculoskeletal: trace pitting edema    Data Reviewed: Basic Metabolic Panel:  Recent Labs Lab 09/03/15 0621 09/05/15 0702 09/06/15 0547 09/07/15 0546 09/07/15 2029 09/07/15 2140 09/08/15 0340  NA 127* 129* 131* 131* 134* 134* 132*  K 4.7 3.9 4.3 4.2 4.8 4.5 5.6*  CL 98* 103 105 104  --   --  106  CO2 22 20* 21* 22  --   --  21*  GLUCOSE 132* 103* 107* 101*  --   --  172*  BUN 72* 62* 57* 49*  --   --  44*  CREATININE 2.62* 1.94* 1.65* 1.55*  --   --  1.69*  CALCIUM 6.9* 7.0* 7.2* 7.3*  --   --  7.4*   Liver Function Tests: No results for input(s): AST, ALT, ALKPHOS,  BILITOT, PROT, ALBUMIN in the last 168 hours. No results for input(s): LIPASE, AMYLASE in the last 168 hours.  Recent Labs Lab 09/04/15 0618  AMMONIA 39*   CBC:  Recent Labs Lab 09/02/15 0544 09/03/15 0621 09/04/15 0618 09/05/15 1351 09/06/15 0547 09/07/15 2029 09/07/15 2140 09/08/15 0340  WBC 15.6* 16.0* 13.6* 11.6* 11.5*  --   --  11.5*  NEUTROABS 10.7* 12.3* 9.9*  --  8.3*  --   --   --   HGB 9.9* 9.3* 8.9* 9.3* 9.0* 10.2* 9.5* 7.4*  HCT 29.2* 26.9* 25.4* 27.5* 26.7* 30.0* 28.0* 22.3*  MCV 93.3 93.1 93.4 95.2 95.4  --   --  94.1  PLT 95* 135* 159 188 221  --   --  222   Cardiac Enzymes:   No results for input(s): CKTOTAL, CKMB, CKMBINDEX, TROPONINI in the last 168 hours. BNP (last 3 results) No results for input(s): BNP in the last 8760 hours.  ProBNP (last 3 results) No results for input(s): PROBNP in the last 8760 hours.  CBG: No results for input(s): GLUCAP in the last 168 hours.  Recent Results (from the past 240 hour(s))  Culture, blood (Routine X 2) w Reflex to ID Panel     Status: None   Collection Time: 09/01/15 12:20 PM  Result Value Ref Range Status   Specimen Description BLOOD RIGHT ARM  Final   Special Requests   Final    BOTTLES DRAWN AEROBIC AND ANAEROBIC 14CC EACH  IMMUNE:COMPROMISED   Culture NO GROWTH 5 DAYS  Final   Report Status 09/06/2015 FINAL  Final  Culture, blood (Routine X 2) w Reflex to ID Panel     Status: None   Collection Time: 09/01/15 12:30 PM  Result Value Ref Range Status   Specimen Description BLOOD LEFT ARM  Final   Special Requests   Final    BOTTLES DRAWN AEROBIC AND ANAEROBIC 12CC EACH  IMMUNE:COMPROMISED   Culture NO GROWTH 5 DAYS  Final   Report Status 09/06/2015 FINAL  Final  MRSA PCR Screening     Status: Abnormal   Collection Time: 09/07/15  6:56 PM  Result Value Ref Range Status   MRSA by PCR POSITIVE (A) NEGATIVE Final    Comment:        The GeneXpert MRSA Assay (FDA approved for NASAL specimens only), is  one component of a comprehensive MRSA colonization surveillance program. It is not intended to diagnose MRSA infection nor to guide or monitor treatment for MRSA infections. RESULT CALLED TO, READ BACK BY AND VERIFIED WITH: M [email or web address]  09/06/15 MKELLY   Culture, routine-abscess     Status: None (Preliminary result)   Collection Time: 09/07/15  8:39 PM  Result Value Ref Range Status   Specimen Description ABSCESS  Final   Special Requests EPIDURAL ABSCESS  Final   Gram Stain   Final    NO WBC SEEN NO SQUAMOUS EPITHELIAL CELLS SEEN NO ORGANISMS SEEN Performed at [facility]    Culture NO GROWTH Performed at [facility]   Final   Report Status PENDING  Incomplete  Anaerobic  culture     Status: None (Preliminary result)   Collection Time: 09/07/15  9:20 PM  Result Value Ref Range Status   Specimen Description ABSCESS  Final   Special Requests THORACIC EPIDURAL  Final   Gram Stain   Final    FEW WBC PRESENT, PREDOMINANTLY PMN NO SQUAMOUS EPITHELIAL CELLS SEEN NO ORGANISMS SEEN Performed at [facility]    Culture PENDING  Incomplete   Report Status PENDING  Incomplete  Culture, routine-abscess     Status: None (Preliminary result)   Collection Time: 09/07/15  9:20 PM  Result Value Ref Range Status   Specimen Description ABSCESS  Final   Special Requests THORACIC EPIDURAL  Final   Gram Stain   Final    FEW WBC PRESENT, PREDOMINANTLY PMN NO SQUAMOUS EPITHELIAL CELLS SEEN NO ORGANISMS SEEN Performed at [facility]    Culture NO GROWTH Performed at [facility]   Final   Report Status PENDING  Incomplete     Studies: Dg Thoracolumabar Spine  09/07/2015  CLINICAL DATA:  Thoracic laminectomy. EXAM: THORACOLUMBAR SPINE 1V COMPARISON:  MRI earlier today. FINDINGS: Multiple intraoperative lateral films are presented after surgery. Surgical instruments are seen in the soft tissues dorsal to the thoracic spine. A surgical instrument  from posteriorly is localized to the chronic compression fracture at T12. IMPRESSION: As above. Electronically Signed   By: Elsie Stain M.D.   On: 09/07/2015 23:38    Scheduled Meds: . antiseptic oral rinse  7 mL Mouth Rinse BID  . docusate sodium  100 mg Oral BID  . HYDROmorphone   Intravenous 6 times per day  . pantoprazole (PROTONIX) IV  40 mg Intravenous QHS  . senna  1 tablet Oral BID  . sodium chloride flush  3 mL Intravenous Q12H  . vancomycin  1,500 mg Intravenous Once  . [START ON 09/09/2015] vancomycin  750 mg Intravenous Q12H    Continuous Infusions: . sodium chloride 1,000 mL (09/08/15 0503)  . sodium chloride       Time spent: 15 minutes  Hollice Espy  [facility] Pager [phone] . If 7PM-7AM, please contact night-coverage at [email or web address], password [facility] 09/08/2015, 2:30 PM  LOS: 11 days

## 2015-09-08 NOTE — Progress Notes (Signed)
Orthopedic Tech Progress Note Patient Details:  Roberto [name] [birth year] [id number]  Patient ID: Roberto [name], male   DOB: 07/22/1946, 70 y.o.   MRN: [id number] Called in bio-tech brace order; spoke with Roberto [name], Roberto [name] 09/08/2015, 9:22 AM

## 2015-09-08 NOTE — Consult Note (Signed)
[facility]  Total days of antibiotics 12        Day 7 ceftaroline        (given cefazolin/vanco preop)       Reason for Consult:mrsa bacteremia with epidural abscess    Referring Physician: [place] Pink  Principal Problem:   Cirrhosis ([place]) Active Problems:   HTN (hypertension)   Alcohol withdrawal ([place])   ETOH abuse    HPI: Roberto [name] is a 70 y.o. male etoh cirrhosis c/b EV, DU, HE on rifaximin. He was admitted initially with knee pain after sustaining injury from bing drinking to [facility] on 1/27. During the work up, it was found that he had MRSA bacteremia with MIR showing thoracic epidural abscess. He had progressive subacute onset of dense paraparesis and decision was for him to be transferred to [facility] intervention. He is POD#1 for T5-T11 laminectomy adn evacuation of epidural abscess by Dr. Cyndy Freeze last night. OR report states that the surgeon encountered pus in the epidural space eccentric to the right at T6 and a phlegmon in the epidural space eccentric to the right at T7 and T8. Cultures were taken of the pus and the phlegmon. I extended my exposure superiorly and inferiorly 1 level at a time until I did not encounter any more phlegmon. At T5 I encountered reached the upper extent of the epidural phlegmon. The lower extent was reached at T11. Tissue cultures sent from the OR. Blood cx from 2/1 have cleared his bacteremia. Presumably we now have source control. The patient has had acute kidney injury during hospitalization. His BL cr was 1.0. And on admit to hospital, he has been 1.5- 1.69 worsened but in setting of vanco plus diuretics. Now, cr stable at 1.69 off of vanco. He reports that he is having slight improvement with right leg (moving toes)  Past Medical History  Diagnosis Date  . COPD (chronic obstructive pulmonary disease) ([place])   . Hypertension   . Hepatic steatosis 12/14/2011  . Compression fracture of L1 lumbar vertebra ([place]) 12/14/2011  .  C2 cervical fracture ([place])   . Duodenal ulcer with hemorrhage 12/15/2011    Likely source of bleeding.per EGD; Dr. Laural Golden  . Esophageal varices without bleeding ([place]) 12/15/2011    per EGD  . Cirrhosis ([place]) 12/16/2011    per ultrasound  . Hx of substance abuse 12/17/2011  . Alcohol dependence with alcohol-induced persisting dementia ([place]) 12/18/2011  . Hepatic encephalopathy ([place])   . Helicobacter pylori antibody positive 12/19/2011  . Encephalopathy 05/18/2012  . UTI (urinary tract infection) 12/13/2011  . Ascites 05/18/2012    S/p paracentesis yielding 7880 mL  . Cirrhosis ([place])   . Depression     Allergies: No Known Allergies  MEDICATIONS: . antiseptic oral rinse  7 mL Mouth Rinse BID  . ceFTAROline (TEFLARO) IV  400 mg Intravenous Q12H  . docusate sodium  100 mg Oral BID  . HYDROmorphone      . HYDROmorphone      . HYDROmorphone   Intravenous 6 times per day  . pantoprazole (PROTONIX) IV  40 mg Intravenous QHS  . senna  1 tablet Oral BID  . sodium chloride flush  3 mL Intravenous Q12H  . vancomycin        Social History  Substance Use Topics  . Smoking status: Current Every Day Smoker -- 1.00 packs/day    Types: Cigarettes  . Smokeless tobacco: Current User     Comment: 1 1/2  pack a day  . Alcohol Use: Yes     Comment: drinking everyday.    History reviewed. No pertinent family history. no history of cirrhosis in his family  Review of Systems  Constitutional: Negative for fever, chills, diaphoresis, activity change, appetite change, fatigue and unexpected weight change.  HENT: Negative for congestion, sore throat, rhinorrhea, sneezing, trouble swallowing and sinus pressure.  Eyes: Negative for photophobia and visual disturbance.  Respiratory: Negative for cough, chest tightness, shortness of breath, wheezing and stridor.  Cardiovascular: Negative for chest pain, palpitations and leg swelling.  Gastrointestinal: +increased abdominal distention. Negative for nausea,  vomiting, abdominal pain, diarrhea, constipation, blood in stool, abdominal distention and anal bleeding.  Genitourinary: Negative for dysuria, hematuria, flank pain and difficulty urinating.  Musculoskeletal: Negative for myalgias, back pain, joint swelling, arthralgias and gait problem.  Skin: Negative LE weakness and decrease sensation  Hematological: Negative for adenopathy. Does not bruise/bleed easily.  Psychiatric/Behavioral: Negative for behavioral problems, confusion, sleep disturbance, dysphoric mood, decreased concentration and agitation.   OBJECTIVE: Temp:  [97.1 F (36.2 C)-98.9 F (37.2 C)] 97.9 F (36.6 C) (02/08 0800) Pulse Rate:  [66-101] 69 (02/08 0800) Resp:  [10-27] 12 (02/08 0800) BP: (87-127)/(46-93) 108/64 mmHg (02/08 0800) SpO2:  [92 %-98 %] 95 % (02/08 0800) Arterial Line BP: (105-110)/(55-57) 110/56 mmHg (02/08 0015) Physical Exam  Constitutional: He is oriented to person, place, and time. He appears well-developed and well-nourished. No distress.  HENT:  Mouth/Throat: Oropharynx is clear and moist. No oropharyngeal exudate.  Cardiovascular: Normal rate, regular rhythm and normal heart sounds. Exam reveals no gallop and no friction rub.  No murmur heard.  Pulmonary/Chest: Effort normal and breath sounds normal. No respiratory distress. He has no wheezes.  Abdominal: Soft. Bowel sounds are normal. Protuberant +/- fluid wave. distended Lymphadenopathy:  He has no cervical adenopathy.  Back: accordian drain in place Neurological: He is alert and oriented to person, place, and time.  Skin: Skin is warm and dry. Scattered spider angiomata Psychiatric: He has a normal mood and affect. His behavior is normal.    LABS: Results for orders placed or performed during the hospital encounter of 08/27/15 (from the past 48 hour(s))  Basic metabolic panel     Status: Abnormal   Collection Time: 09/07/15  5:46 AM  Result Value Ref Range   Sodium 131 (L) 135 - 145 mmol/L    Potassium 4.2 3.5 - 5.1 mmol/L   Chloride 104 101 - 111 mmol/L   CO2 22 22 - 32 mmol/L   Glucose, Bld 101 (H) 65 - 99 mg/dL   BUN 49 (H) 6 - 20 mg/dL   Creatinine, Ser 1.55 (H) 0.61 - 1.24 mg/dL   Calcium 7.3 (L) 8.9 - 10.3 mg/dL   GFR calc non Af Amer 45 (L) >60 mL/min   GFR calc Af Amer 52 (L) >60 mL/min    Comment: (NOTE) The eGFR has been calculated using the CKD EPI equation. This calculation has not been validated in all clinical situations. eGFR's persistently <60 mL/min signify possible Chronic Kidney Disease.    Anion gap 5 5 - 15  Prepare RBC (crossmatch)     Status: None   Collection Time: 09/07/15  6:44 PM  Result Value Ref Range   Order Confirmation ORDER PROCESSED BY BLOOD BANK   MRSA PCR Screening     Status: Abnormal   Collection Time: 09/07/15  6:56 PM  Result Value Ref Range   MRSA by PCR POSITIVE (A) NEGATIVE  Comment:        The GeneXpert MRSA Assay (FDA approved for NASAL specimens only), is one component of a comprehensive MRSA colonization surveillance program. It is not intended to diagnose MRSA infection nor to guide or monitor treatment for MRSA infections. RESULT CALLED TO, READ BACK BY AND VERIFIED WITH: M MASON'@0006'$  09/06/15 MKELLY   Type and screen     Status: None (Preliminary result)   Collection Time: 09/07/15  7:30 PM  Result Value Ref Range   ABO/RH(D) A POS    Antibody Screen NEG    Sample Expiration 09/10/2015    Unit Number [id number]    Blood Component Type RED CELLS,LR    Unit division 00    Status of Unit ISSUED,FINAL    Transfusion Status OK TO TRANSFUSE    Crossmatch Result Compatible    Unit Number [id number]    Blood Component Type RED CELLS,LR    Unit division 00    Status of Unit ALLOCATED    Transfusion Status OK TO TRANSFUSE    Crossmatch Result Compatible   ABO/Rh     Status: None   Collection Time: 09/07/15  7:30 PM  Result Value Ref Range   ABO/RH(D) A POS   Prepare fresh frozen plasma      Status: None   Collection Time: 09/07/15  8:17 PM  Result Value Ref Range   Unit Number [id number]    Blood Component Type THAWED PLASMA    Unit division 00    Status of Unit ISSUED,FINAL    Transfusion Status OK TO TRANSFUSE   Protime-INR     Status: Abnormal   Collection Time: 09/07/15  8:24 PM  Result Value Ref Range   Prothrombin Time 20.7 (H) 11.6 - 15.2 seconds   INR 1.78 (H) 0.00 - 1.49  APTT     Status: Abnormal   Collection Time: 09/07/15  8:24 PM  Result Value Ref Range   aPTT 42 (H) 24 - 37 seconds    Comment:        IF BASELINE aPTT IS ELEVATED, SUGGEST PATIENT RISK ASSESSMENT BE USED TO DETERMINE APPROPRIATE ANTICOAGULANT THERAPY.   I-STAT 7, (LYTES, BLD GAS, ICA, H+H)     Status: Abnormal   Collection Time: 09/07/15  8:29 PM  Result Value Ref Range   pH, Arterial 7.227 (L) 7.350 - 7.450   pCO2 arterial 52.5 (H) 35.0 - 45.0 mmHg   pO2, Arterial 178.0 (H) 80.0 - 100.0 mmHg   Bicarbonate 22.1 20.0 - 24.0 mEq/L   TCO2 24 0 - 100 mmol/L   O2 Saturation 99.0 %   Acid-base deficit 6.0 (H) 0.0 - 2.0 mmol/L   Sodium 134 (L) 135 - 145 mmol/L   Potassium 4.8 3.5 - 5.1 mmol/L   Calcium, Ion 1.08 (L) 1.13 - 1.30 mmol/L   HCT 30.0 (L) 39.0 - 52.0 %   Hemoglobin 10.2 (L) 13.0 - 17.0 g/dL   Patient temperature 36.0 C    Sample type ARTERIAL   Culture, routine-abscess     Status: None (Preliminary result)   Collection Time: 09/07/15  8:39 PM  Result Value Ref Range   Specimen Description ABSCESS    Special Requests EPIDURAL ABSCESS    Gram Stain      NO WBC SEEN NO SQUAMOUS EPITHELIAL CELLS SEEN NO ORGANISMS SEEN Performed at [facility]    Culture NO GROWTH Performed at [facility]     Report Status PENDING   Anaerobic culture  Status: None (Preliminary result)   Collection Time: 09/07/15  9:20 PM  Result Value Ref Range   Specimen Description ABSCESS    Special Requests THORACIC EPIDURAL    Gram Stain      FEW WBC PRESENT,  PREDOMINANTLY PMN NO SQUAMOUS EPITHELIAL CELLS SEEN NO ORGANISMS SEEN Performed at [facility]    Culture PENDING    Report Status PENDING   Culture, routine-abscess     Status: None (Preliminary result)   Collection Time: 09/07/15  9:20 PM  Result Value Ref Range   Specimen Description ABSCESS    Special Requests THORACIC EPIDURAL    Gram Stain      FEW WBC PRESENT, PREDOMINANTLY PMN NO SQUAMOUS EPITHELIAL CELLS SEEN NO ORGANISMS SEEN Performed at [facility]    Culture NO GROWTH Performed at [facility]     Report Status PENDING   I-STAT 7, (LYTES, BLD GAS, ICA, H+H)     Status: Abnormal   Collection Time: 09/07/15  9:40 PM  Result Value Ref Range   pH, Arterial 7.280 (L) 7.350 - 7.450   pCO2 arterial 41.6 35.0 - 45.0 mmHg   pO2, Arterial 166.0 (H) 80.0 - 100.0 mmHg   Bicarbonate 19.7 (L) 20.0 - 24.0 mEq/L   TCO2 21 0 - 100 mmol/L   O2 Saturation 99.0 %   Acid-base deficit 7.0 (H) 0.0 - 2.0 mmol/L   Sodium 134 (L) 135 - 145 mmol/L   Potassium 4.5 3.5 - 5.1 mmol/L   Calcium, Ion 1.00 (L) 1.13 - 1.30 mmol/L   HCT 28.0 (L) 39.0 - 52.0 %   Hemoglobin 9.5 (L) 13.0 - 17.0 g/dL   Patient temperature 36.2 C    Sample type ARTERIAL   Prepare fresh frozen plasma     Status: None   Collection Time: 09/07/15 10:00 PM  Result Value Ref Range   Unit Number [id number]    Blood Component Type THW PLS APHR    Unit division 00    Status of Unit ISSUED,FINAL    Transfusion Status OK TO TRANSFUSE   CBC     Status: Abnormal   Collection Time: 09/08/15  3:40 AM  Result Value Ref Range   WBC 11.5 (H) 4.0 - 10.5 K/uL   RBC 2.37 (L) 4.22 - 5.81 MIL/uL   Hemoglobin 7.4 (L) 13.0 - 17.0 g/dL    Comment: REPEATED TO VERIFY   HCT 22.3 (L) 39.0 - 52.0 %   MCV 94.1 78.0 - 100.0 fL   MCH 31.2 26.0 - 34.0 pg   MCHC 33.2 30.0 - 36.0 g/dL   RDW 19.4 (H) 11.5 - 15.5 %   Platelets 222 150 - 400 K/uL  Basic Metabolic Panel     Status: Abnormal   Collection Time:  09/08/15  3:40 AM  Result Value Ref Range   Sodium 132 (L) 135 - 145 mmol/L   Potassium 5.6 (H) 3.5 - 5.1 mmol/L    Comment: DELTA CHECK NOTED   Chloride 106 101 - 111 mmol/L   CO2 21 (L) 22 - 32 mmol/L   Glucose, Bld 172 (H) 65 - 99 mg/dL   BUN 44 (H) 6 - 20 mg/dL   Creatinine, Ser 1.69 (H) 0.61 - 1.24 mg/dL   Calcium 7.4 (L) 8.9 - 10.3 mg/dL   GFR calc non Af Amer 40 (L) >60 mL/min   GFR calc Af Amer 47 (L) >60 mL/min    Comment: (NOTE) The eGFR has been calculated using the  CKD EPI equation. This calculation has not been validated in all clinical situations. eGFR's persistently <60 mL/min signify possible Chronic Kidney Disease.    Anion gap 5 5 - 15    MICRO: 1/28 blood cx MRSA 2/1 blood cx x 2 NGTD 2/7 tissue cx pending  IMAGING: Dg Thoracolumabar Spine  09/07/2015  CLINICAL DATA:  Thoracic laminectomy. EXAM: THORACOLUMBAR SPINE 1V COMPARISON:  MRI earlier today. FINDINGS: Multiple intraoperative lateral films are presented after surgery. Surgical instruments are seen in the soft tissues dorsal to the thoracic spine. A surgical instrument from posteriorly is localized to the chronic compression fracture at T12. IMPRESSION: As above. Electronically Signed   By: Staci Righter M.D.   On: 09/07/2015 23:38   Mr Cervical Spine Wo Contrast  09/07/2015  CLINICAL DATA:  70 year old hypertensive alcoholic male of bilateral leg weakness. Staph aureus septicemia on treatment. Bilateral leg weakness. Renal failure. Anemia. Subsequent encounter. EXAM: MRI THORACIC SPINE WITHOUT CONTRAST MRI CERVICAL SPINE WITHOUT CONTRAST TECHNIQUE: Multiplanar, multisequence MR imaging of the cervical spine was performed. No intravenous contrast was administered. Multiplanar, multisequence MR imaging of the thoracic spine was performed. No intravenous contrast was administered. COMPARISON:  09/05/2015 lumbar spine MR. 09/01/2015 CT abdomen and pelvis. 12/04/2014 cervical spine CT. FINDINGS: MR CERVICAL SPINE  Exam is motion degraded. Diffuse decreased signal intensity of bone marrow may be related to patient's anemia. Fracture of the anterior inferior aspect of the C2 vertebral body of indeterminate age. Mild edema surrounding the fracture site suggesting that fracture has not completely healed radiographically. Minimal incongruent at this level on the 12/04/2014 exam and therefore fracture may be chronic although not completely healed. No adjacent prevertebral edema/hematoma as expected with an acute fracture. Nonspecific edema within paraspinal musculature C3 through C5 level on the right. This may be reflective result of acute soft tissue injury or myositis. Given the below described findings, infection not excluded although felt to be less likely consideration. Intracranial atrophy. Cervical medullary junction unremarkable. No focal cervical cord signal abnormality. C2-3: Minimal narrowing ventral thecal sac. Minimal foraminal narrowing. C3-4: Facet degenerative changes. Minimal anterior slip C3. Bulge. Narrowing ventral thecal sac. Mild bilateral foraminal narrowing. C4-5: Bulge/broad-based protrusion. Buckling posterior ligaments. Moderate spinal stenosis with circumferential moderate cord flattening. Uncinate hypertrophy and facet degenerative changes. Moderate bilateral foraminal narrowing. C5-6: Broad-based disc osteophyte complex. Spinal stenosis with mild cord flattening. Uncinate hypertrophy and facet degenerative changes. Moderate bilateral foraminal narrowing. C6-7: Facet degenerative changes greater on the left. Mild anterior slip C6. Disc osteophyte complex greater on left. Flattening of the cord greater on left. Facet degenerative changes and uncinate hypertrophy with moderate right-sided and marked left-sided foraminal narrowing. C7-T1: Remote Schmorl's node deformity T1. Facet degenerative changes. No significant spinal stenosis. Minimal foraminal narrowing. MR THORACIC SPINE Patient was not able to  complete second side of axial imaging. Exam is motion degraded. Baseline decreased signal intensity of bone marrow which may be related to patient's underlying anemia. Acute T8 compression fracture with 60% loss of height. Mild retropulsion posterior inferior aspect of the compressed vertebra. Edema extends into posterior elements. Surrounding soft tissue/fluid collection. Of note the is an epidural collection which extends from the upper T9 level to the mid T5 level. This is greatest in a right posterior lateral position at the T5 and T6 level, greatest right lateral position at the T8-9 level and greatest in a ventral right paracentral position at the upper T9 level. This causes mass effect upon the adjacent cord which is displaced anteriorly and  to left at the T5 and T6 level. Suggestion of increased signal within the compress cord which may represent edema. Etiology of the epidural process is indeterminate. In alcoholic patient, compression fracture of T8 may be related to recent fall with epidural component related to blood however patient has Staph Aures septicemia and therefore epidural abscess is also consideration. T8-9 bilateral foraminal narrowing. Retropulsion compressed T8 vertebral body contributes to spinal stenosis and cord flattening. Remote Schmorl's node deformity superior endplate T1. Remote anterior wedge compression deformity involving superior endplate T12 with 75% loss of height anteriorly and mild retropulsion posterior superior aspect with narrowing of the ventral aspect of thecal sac and mild flattening of the ventral cord which is slightly posteriorly displaced. Remote L1 superior endplate compression fracture with 20% loss of height. IMPRESSION: MR THORACIC SPINE Patient was not able to complete second side of axial imaging. Exam is motion degraded. Baseline decreased signal intensity of bone marrow which may be related to patient's underlying anemia. Acute T8 compression fracture with  60% loss of height. Mild retropulsion posterior inferior aspect of the compressed vertebra. Edema extends into posterior elements. Surrounding soft tissue/fluid collection. Spinal stenosis with mild flattening of the adjacent cord. Bilateral T8-9 foraminal narrowing. Epidural collection extends from the upper T9 level to the mid T5 level. This is greatest in a right posterior lateral position at the T5 and T6 level, greatest right lateral position at the T8-9 level and greatest in a ventral right paracentral position at the upper T9 level. This causes mass effect upon the adjacent cord which is displaced anteriorly and to left at the T5 and T6 level. Suggestion of increased signal within the compress cord which may represent edema. Etiology of the epidural process is indeterminate. In alcoholic patient, compression fracture of T8 may be related to recent fall with epidural component related to blood however patient has Staph Aures septicemia and therefore epidural abscess is also consideration. Remote Schmorl's node deformity superior endplate T1. Remote anterior wedge compression deformity involving superior endplate T12 with 10% loss of height anteriorly and mild retropulsion posterior superior aspect with narrowing of the ventral aspect of thecal sac and mild flattening of the ventral cord which is slightly posteriorly displaced. Remote L1 superior endplate compression fracture with 20% loss of height. MR CERVICAL SPINE Exam is motion degraded. Diffuse decreased signal intensity of bone marrow may be related to patient's anemia. Fracture of the anterior inferior aspect of the C2 vertebral body of indeterminate age. Mild edema surrounding the fracture site suggesting that fracture has not completely healed radiographically. Minimal incongruent at this level on the 12/04/2014 exam and therefore fracture may be chronic although not completely healed. No adjacent prevertebral edema/hematoma as expected with an acute  fracture. Nonspecific edema within paraspinal musculature C3 through C5 level on the right. This may be reflective result of acute soft tissue injury or myositis. Given the above described findings, infection not excluded although felt to be less likely consideration. C3-4 bulge. Narrowing ventral thecal sac. Mild bilateral foraminal narrowing. C4-5 multifactorial moderate spinal stenosis with circumferential moderate cord flattening. Moderate bilateral foraminal narrowing. C5-6 broad-based disc osteophyte complex. Spinal stenosis with mild cord flattening. Uncinate hypertrophy and facet degenerative changes. Moderate bilateral foraminal narrowing. C6-7 facet degenerative changes greater on the left. Mild anterior slip C6. Disc osteophyte complex greater on left. Flattening of the cord greater on left. Facet degenerative changes and uncinate hypertrophy with moderate right-sided and marked left-sided foraminal narrowing. These results were called by telephone at the  time of interpretation on 09/07/2015 at 7:56 am to Dr. Cindie Laroche who verbally acknowledged these results. Electronically Signed   By: Genia Del M.D.   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 Staph aureus septicemia on treatment. Bilateral leg weakness. Renal failure. Anemia. Subsequent encounter. EXAM: MRI THORACIC SPINE WITHOUT CONTRAST MRI CERVICAL SPINE WITHOUT CONTRAST TECHNIQUE: Multiplanar, multisequence MR imaging of the cervical spine was performed. No intravenous contrast was administered. Multiplanar, multisequence MR imaging of the thoracic spine was performed. No intravenous contrast was administered. COMPARISON:  09/05/2015 lumbar spine MR. 09/01/2015 CT abdomen and pelvis. 12/04/2014 cervical spine CT. FINDINGS: MR CERVICAL SPINE Exam is motion degraded. Diffuse decreased signal intensity of bone marrow may be related to patient's  anemia. Fracture of the anterior inferior aspect of the C2 vertebral body of indeterminate age. Mild edema surrounding the fracture site suggesting that fracture has not completely healed radiographically. Minimal incongruent at this level on the 12/04/2014 exam and therefore fracture may be chronic although not completely healed. No adjacent prevertebral edema/hematoma as expected with an acute fracture. Nonspecific edema within paraspinal musculature C3 through C5 level on the right. This may be reflective result of acute soft tissue injury or myositis. Given the below described findings, infection not excluded although felt to be less likely consideration. Intracranial atrophy. Cervical medullary junction unremarkable. No focal cervical cord signal abnormality. C2-3: Minimal narrowing ventral thecal sac. Minimal foraminal narrowing. C3-4: Facet degenerative changes. Minimal anterior slip C3. Bulge. Narrowing ventral thecal sac. Mild bilateral foraminal narrowing. C4-5: Bulge/broad-based protrusion. Buckling posterior ligaments. Moderate spinal stenosis with circumferential moderate cord flattening. Uncinate hypertrophy and facet degenerative changes. Moderate bilateral foraminal narrowing. C5-6: Broad-based disc osteophyte complex. Spinal stenosis with mild cord flattening. Uncinate hypertrophy and facet degenerative changes. Moderate bilateral foraminal narrowing. C6-7: Facet degenerative changes greater on the left. Mild anterior slip C6. Disc osteophyte complex greater on left. Flattening of the cord greater on left. Facet degenerative changes and uncinate hypertrophy with moderate right-sided and marked left-sided foraminal narrowing. C7-T1: Remote Schmorl's node deformity T1. Facet degenerative changes. No significant spinal stenosis. Minimal foraminal narrowing. MR THORACIC SPINE Patient was not able to complete second side of axial imaging. Exam is motion degraded. Baseline decreased signal intensity of  bone marrow which may be related to patient's underlying anemia. Acute T8 compression fracture with 60% loss of height. Mild retropulsion posterior inferior aspect of the compressed vertebra. Edema extends into posterior elements. Surrounding soft tissue/fluid collection. Of note the is an epidural collection which extends from the upper T9 level to the mid T5 level. This is greatest in a right posterior lateral position at the T5 and T6 level, greatest right lateral position at the T8-9 level and greatest in a ventral right paracentral position at the upper T9 level. This causes mass effect upon the adjacent cord which is displaced anteriorly and to left at the T5 and T6 level. Suggestion of increased signal within the compress cord which may represent edema. Etiology of the epidural process is indeterminate. In alcoholic patient, compression fracture of T8 may be related to recent fall with epidural component related to blood however patient has Staph Aures septicemia and therefore epidural abscess is also consideration. T8-9 bilateral foraminal narrowing. Retropulsion compressed T8 vertebral body contributes to spinal stenosis and cord flattening. Remote Schmorl's node deformity superior endplate T1. Remote anterior wedge compression deformity involving superior endplate T12 with 89% loss  of height anteriorly and mild retropulsion posterior superior aspect with narrowing of the ventral aspect of thecal sac and mild flattening of the ventral cord which is slightly posteriorly displaced. Remote L1 superior endplate compression fracture with 20% loss of height. IMPRESSION: MR THORACIC SPINE Patient was not able to complete second side of axial imaging. Exam is motion degraded. Baseline decreased signal intensity of bone marrow which may be related to patient's underlying anemia. Acute T8 compression fracture with 60% loss of height. Mild retropulsion posterior inferior aspect of the compressed vertebra. Edema  extends into posterior elements. Surrounding soft tissue/fluid collection. Spinal stenosis with mild flattening of the adjacent cord. Bilateral T8-9 foraminal narrowing. Epidural collection extends from the upper T9 level to the mid T5 level. This is greatest in a right posterior lateral position at the T5 and T6 level, greatest right lateral position at the T8-9 level and greatest in a ventral right paracentral position at the upper T9 level. This causes mass effect upon the adjacent cord which is displaced anteriorly and to left at the T5 and T6 level. Suggestion of increased signal within the compress cord which may represent edema. Etiology of the epidural process is indeterminate. In alcoholic patient, compression fracture of T8 may be related to recent fall with epidural component related to blood however patient has Staph Aures septicemia and therefore epidural abscess is also consideration. Remote Schmorl's node deformity superior endplate T1. Remote anterior wedge compression deformity involving superior endplate T12 with 29% loss of height anteriorly and mild retropulsion posterior superior aspect with narrowing of the ventral aspect of thecal sac and mild flattening of the ventral cord which is slightly posteriorly displaced. Remote L1 superior endplate compression fracture with 20% loss of height. MR CERVICAL SPINE Exam is motion degraded. Diffuse decreased signal intensity of bone marrow may be related to patient's anemia. Fracture of the anterior inferior aspect of the C2 vertebral body of indeterminate age. Mild edema surrounding the fracture site suggesting that fracture has not completely healed radiographically. Minimal incongruent at this level on the 12/04/2014 exam and therefore fracture may be chronic although not completely healed. No adjacent prevertebral edema/hematoma as expected with an acute fracture. Nonspecific edema within paraspinal musculature C3 through C5 level on the right. This  may be reflective result of acute soft tissue injury or myositis. Given the above described findings, infection not excluded although felt to be less likely consideration. C3-4 bulge. Narrowing ventral thecal sac. Mild bilateral foraminal narrowing. C4-5 multifactorial moderate spinal stenosis with circumferential moderate cord flattening. Moderate bilateral foraminal narrowing. C5-6 broad-based disc osteophyte complex. Spinal stenosis with mild cord flattening. Uncinate hypertrophy and facet degenerative changes. Moderate bilateral foraminal narrowing. C6-7 facet degenerative changes greater on the left. Mild anterior slip C6. Disc osteophyte complex greater on left. Flattening of the cord greater on left. Facet degenerative changes and uncinate hypertrophy with moderate right-sided and marked left-sided foraminal narrowing. These results were called by telephone at the time of interpretation on 09/07/2015 at 7:56 am to Dr. Cindie Laroche who verbally acknowledged these results. Electronically Signed   By: Genia Del M.D.   On: 09/07/2015 08:26    Assessment/Plan:  70 yo M with alcoholic cirrhosis, aki,  Found to have MRSA bacteremia and thoracic epidural abscess s/p T5-T11 laminectomy and wash out  - recommend to switch to renally dosed vancomycin. If he has worsening AKI while on vancom, would have low threshold to change to switch to daptomycin @ '8mg'$ /kg dosing - plan to treat for minimum  of 6 wk.  Acute kidney injury = appears stable at 1.7, above his baseline  Cirrhosis c/b hepatic encephalopathy = continue with rifaxamin. May consider evaluation for ascites if need to [place]. [facility] [phone]

## 2015-09-08 NOTE — Progress Notes (Signed)
PT Cancellation Note  Patient Details Name: Roberto [name] MRN: [id number] DOB: 05/28/1946   Cancelled Treatment:    Reason Eval/Treat Not Completed: Other (comment)  Noted order for TLSO and no brace present at this time.  Will follow along and await brace arrival.     Sunny Schlein, [place] [phone] 09/08/2015, 9:22 AM

## 2015-09-08 NOTE — Progress Notes (Signed)
Patient's family came in to visit patient.  Patient's son, Abdulkarim Eberlin, [phone] or Aleric Froelich @ [phone] would like for you to give them and update at your convenient time.

## 2015-09-08 NOTE — Progress Notes (Signed)
Pharmacy Antibiotic Note  Roberto [name] is a 70 y.o. male admitted on 08/27/2015 with knee pain from injury from binge drinking .  Pharmacy has been consulted for vancomycin dosing for epidural abscess and bacteremia. MRSA bacteremia with MRI w/ thoracic epidural abscess.  POD #1 laminectomy and evacuation of epidural abscess.  He was on vanc from 1/29 - 1/2 then on ceftaroline and now to transition back to renally dosed vancomycin.  If his AKI worsens, ID plans to switch to daptomycin.  Creat 1.69 today. Wt 96 kg.   Plan: Vancomycin 1500 mg IV loading dose followed by Vancomycin 750 IV every 12 hours.  Goal trough 15-20 mcg/mL.  F/u renal fxn, wbc, temp, daily BMET x 5 ordered VT as needed  Height: 6' (182.9 cm) Weight: 212 lb (96.163 kg) IBW/kg (Calculated) : 77.6  Temp (24hrs), Avg:97.9 F (36.6 C), Min:97.1 F (36.2 C), Max:98.9 F (37.2 C)   Recent Labs Lab 09/01/15 1441  09/03/15 0621 09/04/15 0618 09/05/15 0702 09/05/15 1351 09/06/15 0547 09/07/15 0546 09/08/15 0340  WBC  --   < > 16.0* 13.6*  --  11.6* 11.5*  --  11.5*  CREATININE  --   < > 2.62*  --  1.94*  --  1.65* 1.55* 1.69*  LATICACIDVEN 1.5  --   --   --   --   --   --   --   --   < > = values in this interval not displayed.  Estimated Creatinine Clearance: 51 mL/min (by C-G formula based on Cr of 1.69).    No Known Allergies  Antimicrobials this admission: vanc 1/29 >> 2/2,  2/8>> Zosyn 1/29 >> 2/1 ceftaroline 2/2>> 2/8  Dose adjustments this admission: 2/1 VT 26 on 1 gm q12, dose changed to 1250 q24  Microbiology results: 1/28 blood cx MRSA 2/1 blood cx x 2 NGTD 2/7 tissue cx pending 2/7 abscess >>ngtd  Thank you for allowing pharmacy to be a part of this patient's care.  Herby Abraham, Pharm.D. [phone] 09/08/2015 11:41 AM

## 2015-09-09 DIAGNOSIS — F1010 Alcohol abuse, uncomplicated: Secondary | ICD-10-CM

## 2015-09-09 DIAGNOSIS — I10 Essential (primary) hypertension: Secondary | ICD-10-CM

## 2015-09-09 LAB — BASIC METABOLIC PANEL
Anion gap: 7 (ref 5–15)
BUN: 43 mg/dL — AB (ref 6–20)
CALCIUM: 7.4 mg/dL — AB (ref 8.9–10.3)
CO2: 20 mmol/L — ABNORMAL LOW (ref 22–32)
CREATININE: 1.53 mg/dL — AB (ref 0.61–1.24)
Chloride: 106 mmol/L (ref 101–111)
GFR calc Af Amer: 53 mL/min — ABNORMAL LOW (ref >60)
GFR, EST NON AFRICAN AMERICAN: 45 mL/min — AB (ref >60)
GLUCOSE: 137 mg/dL — AB (ref 65–99)
Potassium: 4.9 mmol/L (ref 3.5–5.1)
SODIUM: 133 mmol/L — AB (ref 135–145)

## 2015-09-09 LAB — BLOOD PRODUCT ORDER (VERBAL) VERIFICATION

## 2015-09-09 MED ORDER — MUPIROCIN 2 % EX OINT
1.0000 "application " | TOPICAL_OINTMENT | Freq: Two times a day (BID) | CUTANEOUS | Status: DC
  Administered 2015-09-09 – 2015-09-13 (×9): 1 via NASAL
  Filled 2015-09-09: qty 22

## 2015-09-09 MED ORDER — CHLORHEXIDINE GLUCONATE CLOTH 2 % EX PADS
6.0000 | MEDICATED_PAD | Freq: Every day | CUTANEOUS | Status: AC
  Administered 2015-09-09 – 2015-09-13 (×5): 6 via TOPICAL

## 2015-09-09 MED ORDER — PANTOPRAZOLE SODIUM 40 MG PO TBEC
40.0000 mg | DELAYED_RELEASE_TABLET | Freq: Every day | ORAL | Status: DC
  Administered 2015-09-09 – 2015-09-12 (×4): 40 mg via ORAL
  Filled 2015-09-09 (×4): qty 1

## 2015-09-09 NOTE — Progress Notes (Signed)
Subjective: Patient resting comfortably in bed, daughter-in-law at bedside. Postop day 2 following thoracic laminectomy for debridement of epidural abscess by Dr. Romeo Apple Ditty.  Hemovac drain remains in place, putting out about 75 mL per shift (12 hours).  Continues on vancomycin per pharmacy protocol.  Culture from abscess intraoperatively still shows no growth, final report pending. Has been seen by infectious disease consultant.  PT and OT initiated.   Objective: Vital signs in last 24 hours: Filed Vitals:   09/09/15 0400 09/09/15 0800 09/09/15 1000 09/09/15 1200  BP: 124/71 108/50 118/66 121/65  Pulse:    69  Temp: 97.8 F (36.6 C) 97.7 F (36.5 C)  97.7 F (36.5 C)  TempSrc: Oral Oral  Oral  Resp: Height:      Weight:      SpO2: 96%   100%    Intake/Output from previous day: 02/08 0701 - 02/09 0700 In: 2930 [P.O.:480; I.V.:2300; IV Piggyback:150] Out: 1645 [Urine:1500; Drains:145] Intake/Output this shift: Total I/O In: 303 [I.V.:303] Out: 575 [Urine:575]  Physical Exam:  Weak toe wiggling, bilaterally, left slightly more than right. Some adduction of hips, unable to draw up knees.  CBC  Recent Labs  09/07/15 2140 09/08/15 0340  WBC  --  11.5*  HGB 9.5* 7.4*  HCT 28.0* 22.3*  PLT  --  222   BMET  Recent Labs  09/08/15 0340 09/09/15 0515  NA 132* 133*  K 5.6* 4.9  CL 106 106  CO2 21* 20*  GLUCOSE 172* 137*  BUN 44* 43*  CREATININE 1.69* 1.53*  CALCIUM 7.4* 7.4*    Studies/Results: Dg Thoracolumabar Spine  09/07/2015  CLINICAL DATA:  Thoracic laminectomy. EXAM: THORACOLUMBAR SPINE 1V COMPARISON:  MRI earlier today. FINDINGS: Multiple intraoperative lateral films are presented after surgery. Surgical instruments are seen in the soft tissues dorsal to the thoracic spine. A surgical instrument from posteriorly is localized to the chronic compression fracture at T12. IMPRESSION: As above. Electronically Signed   By: Elsie Stain M.D.   On:  09/07/2015 23:38    Assessment/Plan: Neurologic examination without significant change from yesterday's examination by Dr. Bevely Palmer.  Recommend continued care. No new recommendations for now.    Hewitt Shorts, MD 09/09/2015, 5:58 PM

## 2015-09-09 NOTE — Progress Notes (Signed)
CSW confirmed that pt first choice of SNF is [facility]- was worked up for SNF placement at [facility]  CSW will continue to follow  Merlyn Lot, [facility] Clinical Social Worker [phone]

## 2015-09-09 NOTE — Evaluation (Signed)
Physical Therapy Evaluation Patient Details Name: Roberto [name] MRN: [id number] DOB: 05-29-1946 Today's Date: 09/09/2015   History of Present Illness  Pt is a 70 year old male admitted with colonic ileus. He was also found to have anemia, AKI, staph aureus sepsis, alcoholic cirrhosis. During this hospital stay he has developed profound LE weakness and CT/MRI reveal paraspinal soft tissue edema, and old T12, L3 compression fx. Pt lives alone and states that he is normally independent but does acknowledge frequent falls, especially when drinking.  Clinical Impression  Pt admitted with above diagnosis. Pt currently with functional limitations due to the deficits listed below (see PT Problem List). Pt required total assist +3 to get EOB today and Max A to remain upright on EOB for ~7 minutes. Pts BP dropped greater than 30 mmHg systolic and greater than diastolic. Pt c/o of severe pain with all mobility. TLSO did not seem to be an appropriate fit as the back piece was too low and did not provide support for his surgical site. TLSO needs to be reassessed. Pt also had +3 BLE edema, notified nursing for management of edema. BLE compression would also help maintain BP when pt is upright. Pt will benefit from skilled PT to increase their independence and safety with mobility to allow discharge to the venue listed below.     Follow Up Recommendations SNF    Equipment Recommendations  Other (comment) (will continue to assess)    Recommendations for Other Services       Precautions / Restrictions Precautions Precautions: Back;Fall Precaution Comments: TLSO in room, posterior piece needs to be lengthened.  Required Braces or Orthoses: Spinal Brace Spinal Brace: Thoracolumbosacral orthotic;Applied in supine position Restrictions Weight Bearing Restrictions: No      Mobility  Bed Mobility Overal bed mobility: +2 for physical assistance;+ 2 for safety/equipment;Needs Assistance Bed Mobility:  Rolling;Supine to Sit;Sit to Supine Rolling: +2 for physical assistance;+2 for safety/equipment;Max assist   Supine to sit: Total assist;+2 for physical assistance;+2 for safety/equipment;HOB elevated Sit to supine: Total assist;+2 for physical assistance;+2 for safety/equipment;HOB elevated   General bed mobility comments: Max A +2 to roll to don/doff TLSO. Total assit to get EOB with sheet utilizing helicopter technique. .   Transfers                    Ambulation/Gait                Stairs            Wheelchair Mobility    Modified Rankin (Stroke Patients Only)       Balance Overall balance assessment: Needs assistance Sitting-balance support: Bilateral upper extremity supported;Feet supported Sitting balance-Leahy Scale: Zero Sitting balance - Comments: Max A +2 to sit EOB Postural control: Posterior lean                                   Pertinent Vitals/Pain Pain Assessment: 0-10 Pain Score: 8  Pain Location: Back and chest Pain Intervention(s): Repositioned;Limited activity within patient's tolerance    Home Living Family/patient expects to be discharged to:: Skilled nursing facility                 Additional Comments: Pt lives alone.     Prior Function Level of Independence: Independent               Hand Dominance  Extremity/Trunk Assessment   Upper Extremity Assessment: Defer to OT evaluation           Lower Extremity Assessment: RLE deficits/detail;LLE deficits/detail RLE Deficits / Details: 1/5 throughout LE LLE Deficits / Details: 1/5 throughout  Cervical / Trunk Assessment: Kyphotic  Communication   Communication: No difficulties  Cognition Arousal/Alertness: Awake/alert Behavior During Therapy: WFL for tasks assessed/performed Overall Cognitive Status: No family/caregiver present to determine baseline cognitive functioning (Pt had trouble orienting to time and place. )        Memory: Decreased short-term memory              General Comments General comments (skin integrity, edema, etc.): BLE +3 edema Below the knee, +1 Edema in thights    Exercises        Assessment/Plan    PT Assessment Patient needs continued PT services  PT Diagnosis Generalized weakness;Difficulty walking;Acute pain;Altered mental status;Other (comment) (Paraparesis)   PT Problem List Decreased strength;Decreased range of motion;Decreased activity tolerance;Decreased balance;Decreased mobility;Decreased coordination;Decreased cognition;Decreased knowledge of use of DME;Decreased knowledge of precautions;Impaired sensation;Impaired tone;Obesity;Pain  PT Treatment Interventions DME instruction;Functional mobility training;Therapeutic activities;Therapeutic exercise;Balance training;Neuromuscular re-education;Patient/family education;Wheelchair mobility training   PT Goals (Current goals can be found in the Care Plan section) Acute Rehab PT Goals Patient Stated Goal: To regain some independence and have less pain.  PT Goal Formulation: With patient Time For Goal Achievement: 09/23/15 Potential to Achieve Goals: Fair    Frequency Min 2X/week   Barriers to discharge Inaccessible home environment;Decreased caregiver support Lives alone, currently requiring total assist    Co-evaluation               End of Session Equipment Utilized During Treatment: Back brace;Other (comment) (Bed sheet for bed mobility. ) Activity Tolerance: Patient limited by pain Patient left: in bed;with call bell/phone within reach;with bed alarm set;with SCD's reapplied Nurse Communication: Mobility status;Other (comment) (Need for BLE Edema control )         Time: 1610-9604 PT Time Calculation (min) (ACUTE ONLY): 40 min   Charges:         PT G Codes:       Everlean Cherry, SPT Everlean Cherry 09/09/2015, 11:06 AM

## 2015-09-09 NOTE — Plan of Care (Signed)
Problem: Acute Rehab PT Goals(only PT should resolve) Goal: Pt will Roll Supine to Side Without cues for sequencing and precautions Goal: Pt Will Go Supine/Side To Sit Without cues for sequencing and precautions.  Goal: Pt Will Transfer Bed To Chair/Chair To Bed With use of a slide board.

## 2015-09-09 NOTE — Anesthesia Postprocedure Evaluation (Signed)
Anesthesia Post Note  Patient: Roberto [name]  Procedure(s) Performed: Procedure(s) (LRB): THORACIC FIVE-THORACIC ELEVEN LAMINECTOMY FOR EPIDURAL ABSCESS (N/A)  Patient location during evaluation: PACU Anesthesia Type: General Level of consciousness: awake and alert Pain management: pain level controlled Vital Signs Assessment: post-procedure vital signs reviewed and stable Respiratory status: spontaneous breathing, nonlabored ventilation, respiratory function stable and patient connected to nasal cannula oxygen Cardiovascular status: blood pressure returned to baseline and stable Postop Assessment: no signs of nausea or vomiting Anesthetic complications: no    Last Vitals:  Filed Vitals:   09/08/15 2300 09/09/15 0400  BP: 138/76 124/71  Pulse:    Temp: 37 C 36.6 C  Resp: 12 10    Last Pain:  Filed Vitals:   09/09/15 0622  PainSc: Asleep                 Nicki Furlan S

## 2015-09-09 NOTE — Evaluation (Signed)
Occupational Therapy Evaluation Patient Details Name: Roberto [name] MRN: [id number] DOB: 06-18-1946 Today's Date: 09/09/2015    History of Present Illness Pt is a 70 year old male admitted with colonic ileus. He was also found to have anemia, AKI, staph aureus sepsis, alcoholic cirrhosis. During this hospital stay he has developed profound LE weakness and CT/MRI reveal paraspinal soft tissue edema, and old T12, L3 compression fx. Pt lives alone and states that he is normally independent but does acknowledge frequent falls, especially when drinking. Pt underwent a T5-T11 spinal surgery for epidural abcess.    Clinical Impression   Pt admitted with the above diagnosis and overall is presenting with paraplegia.  Pt would benefit from cont OT to increase independence with basic adls and teach pt to be independent without the full use of his legs.  Pt lives alone and states he does not have someone available to care for him 24/7 so feel SNF with possible long term care to follow will be the only option unless family steps in to care for this pt.  Pt will require 24/7 care at d/c.    Follow Up Recommendations  SNF;Supervision/Assistance - 24 hour    Equipment Recommendations  Other (comment) (to be determined.)    Recommendations for Other Services       Precautions / Restrictions Precautions Precautions: Back;Fall Precaution Comments: TLSO in room.  TLSO lined up and placed well on pt when up.  Feel the brace does not support pt enough in posteior part in thorasic area.  May need to be taller. Required Braces or Orthoses: Spinal Brace Spinal Brace: Thoracolumbosacral orthotic;Applied in supine position Restrictions Weight Bearing Restrictions: No      Mobility Bed Mobility Overal bed mobility: +2 for physical assistance;+ 2 for safety/equipment;Needs Assistance Bed Mobility: Rolling;Supine to Sit;Sit to Supine Rolling: Max assist;+2 for physical assistance;+2 for safety/equipment    Supine to sit: Total assist;+2 for physical assistance;+2 for safety/equipment Sit to supine: Total assist;+2 for physical assistance;+2 for safety/equipment   General bed mobility comments: Max A +2 to roll to don/doff TLSO. Total assit to get EOB with sheet utilizing helicopter technique. .   Transfers Overall transfer level: Needs assistance               General transfer comment: Did not attempt transfers as this was pts first time up.  Sat pt on EOB and BP dropped to 99/67.    Balance Overall balance assessment: Needs assistance Sitting-balance support: Bilateral upper extremity supported;Feet supported Sitting balance-Leahy Scale: Zero Sitting balance - Comments: Total assist x2 to sit. Postural control: Posterior lean     Standing balance comment: pt unable to stand due to paraplegia                            ADL Overall ADL's : Needs assistance/impaired Eating/Feeding: Set up;Sitting   Grooming: Set up;Sitting   Upper Body Bathing: Set up;Sitting Upper Body Bathing Details (indicate cue type and reason): pt needs to be in supported sitting for all these sitting tasks. Lower Body Bathing: Total assistance;+2 for physical assistance;Bed level   Upper Body Dressing : Moderate assistance;Sitting (total assist for TLSO) Upper Body Dressing Details (indicate cue type and reason): pt needs total assist for TLSO.  Pt with poor sitting balance so must be in supported sitting to attempt UE dressing Lower Body Dressing: Total assistance;+2 for physical assistance;Bed level   Toilet Transfer: +2 for physical assistance;Total  assistance   Toileting- Clothing Manipulation and Hygiene: +2 for physical assistance;Total assistance;Bed level       Functional mobility during ADLs: Total assistance;+2 for physical assistance General ADL Comments: Pt completely dependent with all LE adls and toileting adls due to new paraplegia.  Pt can do some UE adls but needs to be  supported in sitting due to poor sitting  balance.     Vision Vision Assessment?: No apparent visual deficits   Perception     Praxis      Pertinent Vitals/Pain Pain Assessment: 0-10 Pain Score: 8  Pain Location: Back pain Pain Descriptors / Indicators: Aching;Moaning;Sore Pain Intervention(s): Limited activity within patient's tolerance;Monitored during session;Repositioned     Hand Dominance Right   Extremity/Trunk Assessment Upper Extremity Assessment Upper Extremity Assessment: Overall WFL for tasks assessed   Lower Extremity Assessment Lower Extremity Assessment: Defer to PT evaluation RLE Deficits / Details: 1/5 throughout LE RLE Sensation: decreased light touch;decreased proprioception RLE Coordination: decreased gross motor LLE Deficits / Details: 1/5 throughout LLE Sensation: decreased light touch;decreased proprioception LLE Coordination: decreased gross motor   Cervical / Trunk Assessment Cervical / Trunk Assessment: Kyphotic   Communication Communication Communication: No difficulties   Cognition Arousal/Alertness: Awake/alert Behavior During Therapy: WFL for tasks assessed/performed Overall Cognitive Status: No family/caregiver present to determine baseline cognitive functioning (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 )       Memory: Decreased short-term memory             General Comments       Exercises       Shoulder Instructions      Home Living Family/patient expects to be discharged to:: Skilled nursing facility                                 Additional Comments: Pt lives alone.       Prior Functioning/Environment Level of Independence: Independent             OT Diagnosis: Generalized weakness;Cognitive deficits;Acute pain;Paresis   OT Problem List: Decreased strength;Decreased range of motion;Decreased activity tolerance;Impaired balance (sitting and/or standing);Decreased cognition;Decreased safety  awareness;Decreased knowledge of use of DME or AE;Decreased knowledge of precautions;Impaired sensation;Impaired tone;Pain;Increased edema   OT Treatment/Interventions: Self-care/ADL training;DME and/or AE instruction;Therapeutic activities    OT Goals(Current goals can be found in the care plan section) Acute Rehab OT Goals Patient Stated Goal: To regain some independence and have less pain.  OT Goal Formulation: With patient Time For Goal Achievement: 09/23/15 Potential to Achieve Goals: Fair ADL Goals Pt Will Perform Upper Body Bathing: with set-up;sitting;bed level Pt Will Perform Upper Body Dressing: with set-up;sitting;bed level Pt Will Perform Lower Body Dressing: with max assist;bed level;sitting/lateral leans Pt Will Transfer to Toilet: with max assist;with transfer board;bedside commode Additional ADL Goal #1: Pt will sit EOB for 3 minutes with mod assist in prep for further EOB activities and preparing for transfers. Additional ADL Goal #2: Pt will tolerate sitting in hospital bed with [employer] all the way up (with brace on) and legs out front in long sitting to prepare for dressing in bed.   OT Frequency: Min 2X/week   Barriers to D/C: Decreased caregiver support  Pt lives alone       Co-evaluation PT/OT/SLP Co-Evaluation/Treatment: Yes Reason for Co-Treatment: Complexity of the patient's impairments (multi-system involvement);For patient/therapist safety PT goals addressed during session: Mobility/safety with mobility;Balance OT goals addressed during session: ADL's and  self-care      End of Session Equipment Utilized During Treatment: Back brace Nurse Communication: Mobility status;Other (comment) (drop in BP sitting, need for ace wraps, brace needs adjusted)  Activity Tolerance: Patient limited by pain Patient left: in bed;with call bell/phone within reach   Time: 1610-9604 OT Time Calculation (min): 42 min Charges:  OT General Charges $OT Visit: 1 Procedure OT  Evaluation $OT Eval Moderate Complexity: 1 Procedure OT Treatments $Self Care/Home Management : 8-22 mins G-Codes:    Hope Budds 2015/09/16, 12:10 PM  [phone]

## 2015-09-09 NOTE — Progress Notes (Signed)
PROGRESS NOTE  TYE VIGO ZOX:[id number] DOB: 14-Sep-1945 DOA: 08/27/2015 PCP: Isabella Stalling, MD  HPI/Recap of past 24 hours: Patient is a 70 year old male with past medical history of alcohol abuse and secondary cirrhosis initially admitted on 1/27 with alcohol withdrawal and swollen right knee concerning for septic joint.  Seen by orthopedic surgery and knee aspirated, right knee cultures grew out MRSA as did blood cultures and patient initially started on IV antibiotics.during this time, patient was also treated for alcohol withdrawal.  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  Upon workup, patient underwent MRI of the thoracic spine and he was found to have an epidural abscess. Patient transferred from [facility] to [facility] and seen by neurosurgery.  He underwent a T5-T11 laminectomy with evacuation of epidural abscess on the evening of 2/7. Cultures were sent. Of note, blood cultures from 21 noted clearing of bacteremia. Only other complication during patient's hospitalization was acute kidney injury possibly felt to be from vancomycin although renal function now normal.  Patient today feeling better. Postop day 2. His acute can move his foot just a little bit more.  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 Appreciate their assistance. Status post right knee joint aspiration and thoracic laminectomy with epidural evacuation of abscess. Restarted on IV vancomycin.neurosurgery following. Recommendation for skilled nursing. Active Problems:   HTN (hypertension): monitoring blood pressures    Severe protein-calorie malnutrition (HCC)   ETOH abuse with secondary cirrhosis, hepatic encephalopathy and history of varices: Course complicated by recent withdrawal, treated. Stable. Continue rifaximin.  Acute kidney  injury: Creatinine continues to improve, close to baseline Code Status: full code   Family Communication: left message with son   Disposition Plan: Skilled nursing when cleared by neurosurgery   Consultants:  Orthopedic surgery  Infectious disease  Neurosurgery  Gastroenterology  Procedures:  Status post laminectomy T5-T11 down to/7  Status post aspiration of knee joint done 2/1  Antibiotics:  IIV Rocephin 1/28-1/29  IV Zithromax 1/281/29  IV Zosyn 1/29-2/1  IV vancomycin 1/29-2/2, 2/8-present  IV Teflaro 2/2-2/8   Objective: BP 121/65 mmHg  Pulse 69  Temp(Src) 97.7 F (36.5 C) (Oral)  Resp 14  Ht 6' (1.829 m)  Wt 96.163 kg (212 lb)  BMI 28.75 kg/m2  SpO2 100%  Intake/Output Summary (Last 24 hours) at 09/09/15 1843 Last data filed at 09/09/15 1800  Gross per 24 hour  Intake   2933 ml  Output   1620 ml  Net   1313 ml   Filed Weights   08/27/15 0710  Weight: 96.163 kg (212 lb)    Exam: Little change from previous day  General:  Alert and oriented 3,   Cardiovascular: regular rate and rhythm, S1-S2   Respiratory: clear to auscultation bilaterally   Abdomen: soft, nontender, nondistended, positive bowel sounds   Musculoskeletal: trace pitting edema    Data Reviewed: Basic Metabolic Panel:  Recent Labs Lab 09/05/15 0702 09/06/15 0547 09/07/15 0546 09/07/15 2029 09/07/15 2140 09/08/15 0340 09/09/15 0515  NA 129* 131* 131* 134* 134* 132* 133*  K 3.9 4.3 4.2 4.8 4.5 5.6* 4.9  CL 103 105 104  --   --  106 106  CO2 20* 21* 22  --   --  21* 20*  GLUCOSE 103* 107* 101*  --   --  172* 137*  BUN 62* 57*  49*  --   --  44* 43*  CREATININE 1.94* 1.65* 1.55*  --   --  1.69* 1.53*  CALCIUM 7.0* 7.2* 7.3*  --   --  7.4* 7.4*   Liver Function Tests: No results for input(s): AST, ALT, ALKPHOS, BILITOT, PROT, ALBUMIN in the last 168 hours. No results for input(s): LIPASE, AMYLASE in the last 168 hours.  Recent Labs Lab 09/04/15 0618    AMMONIA 39*   CBC:  Recent Labs Lab 09/03/15 0621 09/04/15 0618 09/05/15 1351 09/06/15 0547 09/07/15 2029 09/07/15 2140 09/08/15 0340  WBC 16.0* 13.6* 11.6* 11.5*  --   --  11.5*  NEUTROABS 12.3* 9.9*  --  8.3*  --   --   --   HGB 9.3* 8.9* 9.3* 9.0* 10.2* 9.5* 7.4*  HCT 26.9* 25.4* 27.5* 26.7* 30.0* 28.0* 22.3*  MCV 93.1 93.4 95.2 95.4  --   --  94.1  PLT 135* 159 188 221  --   --  222   Cardiac Enzymes:   No results for input(s): CKTOTAL, CKMB, CKMBINDEX, TROPONINI in the last 168 hours. BNP (last 3 results) No results for input(s): BNP in the last 8760 hours.  ProBNP (last 3 results) No results for input(s): PROBNP in the last 8760 hours.  CBG: No results for input(s): GLUCAP in the last 168 hours.  Recent Results (from the past 240 hour(s))  Culture, blood (Routine X 2) w Reflex to ID Panel     Status: None   Collection Time: 09/01/15 12:20 PM  Result Value Ref Range Status   Specimen Description BLOOD RIGHT ARM  Final   Special Requests   Final    BOTTLES DRAWN AEROBIC AND ANAEROBIC 14CC EACH  IMMUNE:COMPROMISED   Culture NO GROWTH 5 DAYS  Final   Report Status 09/06/2015 FINAL  Final  Culture, blood (Routine X 2) w Reflex to ID Panel     Status: None   Collection Time: 09/01/15 12:30 PM  Result Value Ref Range Status   Specimen Description BLOOD LEFT ARM  Final   Special Requests   Final    BOTTLES DRAWN AEROBIC AND ANAEROBIC 12CC EACH  IMMUNE:COMPROMISED   Culture NO GROWTH 5 DAYS  Final   Report Status 09/06/2015 FINAL  Final  MRSA PCR Screening     Status: Abnormal   Collection Time: 09/07/15  6:56 PM  Result Value Ref Range Status   MRSA by PCR POSITIVE (A) NEGATIVE Final    Comment:        The GeneXpert MRSA Assay (FDA approved for NASAL specimens only), is one component of a comprehensive MRSA colonization surveillance program. It is not intended to diagnose MRSA infection nor to guide or monitor treatment for MRSA infections. RESULT CALLED  TO, READ BACK BY AND VERIFIED WITH: M [email or web address]  09/06/15 MKELLY   Anaerobic culture     Status: None (Preliminary result)   Collection Time: 09/07/15  8:39 PM  Result Value Ref Range Status   Specimen Description ABSCESS  Final   Special Requests EPIDURAL ABSCESS  Final   Gram Stain   Final    NO WBC SEEN NO SQUAMOUS EPITHELIAL CELLS SEEN NO ORGANISMS SEEN Performed at [facility]    Culture   Final    NO ANAEROBES ISOLATED; CULTURE IN PROGRESS FOR 5 DAYS Performed at [facility]    Report Status PENDING  Incomplete  Culture, routine-abscess     Status: None (Preliminary result)   Collection Time: 09/07/15  8:39 PM  Result Value Ref Range Status   Specimen Description ABSCESS  Final   Special Requests EPIDURAL ABSCESS  Final   Gram Stain   Final    NO WBC SEEN NO SQUAMOUS EPITHELIAL CELLS SEEN NO ORGANISMS SEEN Performed at [facility]    Culture   Final    NO GROWTH 1 DAY Performed at [facility]    Report Status PENDING  Incomplete  Anaerobic culture     Status: None (Preliminary result)   Collection Time: 09/07/15  9:20 PM  Result Value Ref Range Status   Specimen Description ABSCESS  Final   Special Requests THORACIC EPIDURAL  Final   Gram Stain   Final    FEW WBC PRESENT, PREDOMINANTLY PMN NO SQUAMOUS EPITHELIAL CELLS SEEN NO ORGANISMS SEEN Performed at [facility]    Culture   Final    NO ANAEROBES ISOLATED; CULTURE IN PROGRESS FOR 5 DAYS Performed at [facility]    Report Status PENDING  Incomplete  Culture, routine-abscess     Status: None (Preliminary result)   Collection Time: 09/07/15  9:20 PM  Result Value Ref Range Status   Specimen Description ABSCESS  Final   Special Requests THORACIC EPIDURAL  Final   Gram Stain   Final    FEW WBC PRESENT, PREDOMINANTLY PMN NO SQUAMOUS EPITHELIAL CELLS SEEN NO ORGANISMS SEEN Performed at [facility]    Culture   Final    Culture reincubated  for better growth Performed at [facility]    Report Status PENDING  Incomplete     Studies: No results found.  Scheduled Meds: . antiseptic oral rinse  7 mL Mouth Rinse BID  . Chlorhexidine Gluconate Cloth  6 each Topical Q0600  . docusate sodium  100 mg Oral BID  . HYDROmorphone   Intravenous 6 times per day  . mupirocin ointment  1 application Nasal BID  . pantoprazole  40 mg Oral QHS  . senna  1 tablet Oral BID  . sodium chloride flush  3 mL Intravenous Q12H  . vancomycin  750 mg Intravenous Q12H    Continuous Infusions: . sodium chloride 100 mL/hr at 09/09/15 0709  . sodium chloride       Time spent: 15 minutes  Hollice Espy  [facility] Pager [phone] . If 7PM-7AM, please contact night-coverage at [email or web address], password [facility] 09/09/2015, 6:43 PM  LOS: 12 days

## 2015-09-10 LAB — CULTURE, ROUTINE-ABSCESS
Culture: NO GROWTH
Gram Stain: NONE SEEN

## 2015-09-10 LAB — URINE MICROSCOPIC-ADD ON

## 2015-09-10 LAB — URINALYSIS, ROUTINE W REFLEX MICROSCOPIC
BILIRUBIN URINE: NEGATIVE
Glucose, UA: NEGATIVE mg/dL
Ketones, ur: NEGATIVE mg/dL
Nitrite: NEGATIVE
PH: 6 (ref 5.0–8.0)
Protein, ur: NEGATIVE mg/dL
SPECIFIC GRAVITY, URINE: 1.017 (ref 1.005–1.030)

## 2015-09-10 LAB — CBC
HEMATOCRIT: 24.1 % — AB (ref 39.0–52.0)
HEMOGLOBIN: 7.7 g/dL — AB (ref 13.0–17.0)
MCH: 31.3 pg (ref 26.0–34.0)
MCHC: 32 g/dL (ref 30.0–36.0)
MCV: 98 fL (ref 78.0–100.0)
Platelets: 240 10*3/uL (ref 150–400)
RBC: 2.46 MIL/uL — AB (ref 4.22–5.81)
RDW: 21.6 % — ABNORMAL HIGH (ref 11.5–15.5)
WBC: 12.5 10*3/uL — ABNORMAL HIGH (ref 4.0–10.5)

## 2015-09-10 LAB — BASIC METABOLIC PANEL
ANION GAP: 3 — AB (ref 5–15)
BUN: 37 mg/dL — ABNORMAL HIGH (ref 6–20)
CALCIUM: 7.8 mg/dL — AB (ref 8.9–10.3)
CHLORIDE: 108 mmol/L (ref 101–111)
CO2: 24 mmol/L (ref 22–32)
Creatinine, Ser: 1.44 mg/dL — ABNORMAL HIGH (ref 0.61–1.24)
GFR calc non Af Amer: 49 mL/min — ABNORMAL LOW (ref >60)
GFR, EST AFRICAN AMERICAN: 57 mL/min — AB (ref >60)
GLUCOSE: 117 mg/dL — AB (ref 65–99)
POTASSIUM: 4.9 mmol/L (ref 3.5–5.1)
Sodium: 135 mmol/L (ref 135–145)

## 2015-09-10 MED ORDER — SODIUM CHLORIDE 0.9% FLUSH
10.0000 mL | INTRAVENOUS | Status: DC | PRN
  Administered 2015-09-12: 10 mL
  Filled 2015-09-10: qty 40

## 2015-09-10 MED ORDER — VANCOMYCIN HCL IN DEXTROSE 750-5 MG/150ML-% IV SOLN
750.0000 mg | Freq: Two times a day (BID) | INTRAVENOUS | Status: DC
Start: 2015-09-11 — End: 2015-09-11
  Administered 2015-09-10: 750 mg via INTRAVENOUS
  Filled 2015-09-10 (×2): qty 150

## 2015-09-10 NOTE — Progress Notes (Signed)
Peripherally Inserted Central Catheter/Midline Placement  The IV Nurse has discussed with the patient and/or persons authorized to consent for the patient, the purpose of this procedure and the potential benefits and risks involved with this procedure.  The benefits include less needle sticks, lab draws from the catheter and patient may be discharged home with the catheter.  Risks include, but not limited to, infection, bleeding, blood clot (thrombus formation), and puncture of an artery; nerve damage and irregular heat beat.  Alternatives to this procedure were also discussed.  PICC/Midline Placement Documentation        Timmothy Sours 09/10/2015, 3:41 PM

## 2015-09-10 NOTE — Progress Notes (Signed)
[facility]    Date of Admission:  08/27/2015   Total days of antibiotics 14        Day 2 vanco           ID: Roberto [name] is a 70 y.o. male with etoh cirrhossis presented with knee pain and acute lower extremity weakness found to have MRSA bacteremia and thoracic epidural abscess and T8 discitis Principal Problem:   Epidural abscess Active Problems:   HTN (hypertension)   Cirrhosis (HCC)   Alcohol withdrawal (HCC)   Severe protein-calorie malnutrition (HCC)   ETOH abuse    Subjective: Afebrile  Interval events: getting picc line  Medications:  . antiseptic oral rinse  7 mL Mouth Rinse BID  . Chlorhexidine Gluconate Cloth  6 each Topical Q0600  . docusate sodium  100 mg Oral BID  . mupirocin ointment  1 application Nasal BID  . pantoprazole  40 mg Oral QHS  . senna  1 tablet Oral BID  . sodium chloride flush  3 mL Intravenous Q12H  . vancomycin  750 mg Intravenous Q12H    Objective: Vital signs in last 24 hours: Temp:  [97.4 F (36.3 C)-98.2 F (36.8 C)] 98.2 F (36.8 C) (02/10 1100) Pulse Rate:  [77-95] 88 (02/10 1100) Resp:  [16-20] 19 (02/10 1100) BP: (125-170)/(69-79) 158/74 mmHg (02/10 1100) SpO2:  [95 %-100 %] 96 % (02/10 1100) Weight:  [263 lb 6.4 oz (119.477 kg)] 263 lb 6.4 oz (119.477 kg) (02/09 2330)  Physical Exam  Constitutional: He is oriented to person, place, and time. He appears well-developed and well-nourished. No distress.  HENT:  Mouth/Throat: Oropharynx is clear and moist. No oropharyngeal exudate.  Cardiovascular: Normal rate, regular rhythm and normal heart sounds. Exam reveals no gallop and no friction rub.  No murmur heard.  Pulmonary/Chest: Effort normal and breath sounds normal. No respiratory distress. He has no wheezes.  Abdominal: Soft. Bowel sounds are normal. Protuberant distended abdomen Neurological: He is alert and oriented to person, place, and time. Can move toes bilaterally but not upper  quads Skin: Skin is warm and dry. No rash noted. No erythema. Ext: right arm picc line in place. Pitting edema +2 up to knee bilaterally. +3 dorsum of feet  Psychiatric: He has a normal mood and affect. His behavior is normal.     Lab Results  Recent Labs  09/08/15 0340 09/09/15 0515 09/10/15 0325  WBC 11.5*  --  12.5*  HGB 7.4*  --  7.7*  HCT 22.3*  --  24.1*  NA 132* 133* 135  K 5.6* 4.9 4.9  CL 106 106 108  CO2 21* 20* 24  BUN 44* 43* 37*  CREATININE 1.69* 1.53* 1.44*   Lab Results  Component Value Date   ESRSEDRATE 126* 09/05/2015   No results found for: CRP  Microbiology: 2/7 epidural fluid + staph aureus - sensi is pending Studies/Results: No results found.   Assessment/Plan:  70 yo M with alcoholic cirrhosis, aki, Found to have MRSA bacteremia and thoracic epidural abscess s/p T5-T11 laminectomy and wash out  - recommend to switch to renally dosed vancomycin. If he has worsening AKI while on vancom, would have low threshold to change to switch to daptomycin @ /kg dosing - plan to treat for 8 wk of IV therapy and then may need to switch to oral abtx for chronic suppression - vancomycin goal of 15-20, upon discharge will need twice a week BMP, weekly cbc, and weekly  vanco trough - use Feb 8th as day 1 of 56 - end date of April 4th  Acute kidney injury = appears stable at 1.4, improving, still slightly above his baseline  Cirrhosis c/b hepatic encephalopathy = continue with rifaxamin.   Will sign off. Will arrange for the patient to be seen in follow up in the ID clinic in 4-6 wk  Jesiah Grismer, [facility] Cell: [phone] Pager: [phone]  09/10/2015, 3:00 PM

## 2015-09-10 NOTE — Progress Notes (Signed)
Called ortho tech for patient's back brace. The one he has is too small.

## 2015-09-10 NOTE — Progress Notes (Signed)
Patient arrived from [facility] around 0000, he is disoriented and bed ridden unable to move lower extremities he was given Ambien prior to transfer which has made him more confused, vital signs are stable attempting to DC order for PCA as patients judgment and ETOH is an issue for proper use , will continue to monitor. Patient requires frequent reorientation will continue to monitor.

## 2015-09-10 NOTE — Progress Notes (Signed)
Patient transferred to [facility].  Written handoff provided to unit CSW for follow up.  Patient desires SNF placement at [facility] when medically stable.  This CSW wll sign off.  Lorri Frederick. Jaci Lazier, [place] [phone]

## 2015-09-10 NOTE — Progress Notes (Signed)
Patient removed IV neither I or IV team could start a new peripheral line, informed [facility] on call and K. Schorr will put order in for PICC in the morning.

## 2015-09-10 NOTE — Progress Notes (Signed)
No acute events AVSS Awake and alert Able to barely wiggle toes and adduct at hips, otherwise no motor strength in BLE Incision c/d/i D/c drain Continue abx PT/OT Likely to need inpatient rehab

## 2015-09-10 NOTE — Progress Notes (Signed)
PROGRESS NOTE  Roberto [name] ZOX:[id number] DOB: Jun 29, 1946 DOA: 08/27/2015 PCP: Isabella Stalling, MD  HPI/Recap of past 24 hours: Patient is a 70 year old male with past medical history of alcohol abuse and secondary cirrhosis initially admitted on 1/27 with alcohol withdrawal and swollen right knee concerning for septic joint.  Seen by orthopedic surgery and knee aspirated, right knee cultures grew out MRSA as did blood cultures and patient initially started on IV antibiotics.during this time, patient was also treated for alcohol withdrawal.  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  Upon workup, patient underwent MRI of the thoracic spine and he was found to have an epidural abscess. Patient transferred from [facility] to [facility] and seen by neurosurgery.  He underwent a T5-T11 laminectomy with evacuation of epidural abscess on the evening of 2/7. Cultures were sent. Of note, blood cultures from 21 noted clearing of bacteremia. Only other complication during patient's hospitalization was acute kidney injury possibly felt to be from vancomycin although renal function now normal.  POD#3: Patient transferred to medical floor.  A little confused upon arrival last night.  Sleeping more today.  Able to foot about same as yesterday.  No other complaints.  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 Appreciate their assistance. Status post right knee joint aspiration and thoracic laminectomy with epidural evacuation of abscess. Restarted on IV vancomycin.neurosurgery following. Recommendation for skilled nursing.  PICC line placed. Active Problems:   HTN (hypertension): monitoring blood pressures    Severe protein-calorie malnutrition (HCC)   ETOH abuse with secondary cirrhosis, hepatic encephalopathy and history of  varices: Course complicated by recent withdrawal, treated. Stable. Continue rifaximin.  Acute kidney injury: Creatinine now at baseline Code Status: full code   Family Communication: left message with son   Disposition Plan: Skilled nursing beginning of next week   Consultants:  Orthopedic surgery  Infectious disease  Neurosurgery  Gastroenterology  Procedures:  Status post laminectomy T5-T11 down to/7  Status post aspiration of knee joint done 2/1  Antibiotics:  IIV Rocephin 1/28-1/29  IV Zithromax 1/281/29  IV Zosyn 1/29-2/1  IV vancomycin 1/29-2/2, 2/8-present  IV Teflaro 2/2-2/8   Objective: BP 158/74 mmHg  Pulse 88  Temp(Src) 98.2 F (36.8 C) (Oral)  Resp 19  Ht 6\' 1"  (1.854 m)  Wt 119.477 kg (263 lb 6.4 oz)  BMI 34.76 kg/m2  SpO2 96%  Intake/Output Summary (Last 24 hours) at 09/10/15 1257 Last data filed at 09/10/15 0604  Gross per 24 hour  Intake   1240 ml  Output   1460 ml  Net   -220 ml   Filed Weights   08/27/15 0710 09/09/15 2330  Weight: 96.163 kg (212 lb) 119.477 kg (263 lb 6.4 oz)    Exam:   General:  More somnolent today  Cardiovascular: regular rate and rhythm, S1-S2   Respiratory: clear to auscultation bilaterally   Abdomen: soft, nontender, nondistended, positive bowel sounds   Musculoskeletal: trace pitting edema    Data Reviewed: Basic Metabolic Panel:  Recent Labs Lab 09/06/15 0547 09/07/15 0546 09/07/15 2029 09/07/15 2140 09/08/15 0340 09/09/15 0515 09/10/15 0325  NA 131* 131* 134* 134* 132* 133* 135  K 4.3 4.2 4.8 4.5 5.6* 4.9 4.9  CL 105 104  --   --  106 106 108  CO2 21* 22  --   --  21* 20*  24  GLUCOSE 107* 101*  --   --  172* 137* 117*  BUN 57* 49*  --   --  44* 43* 37*  CREATININE 1.65* 1.55*  --   --  1.69* 1.53* 1.44*  CALCIUM 7.2* 7.3*  --   --  7.4* 7.4* 7.8*   Liver Function Tests: No results for input(s): AST, ALT, ALKPHOS, BILITOT, PROT, ALBUMIN in the last 168 hours. No results for  input(s): LIPASE, AMYLASE in the last 168 hours.  Recent Labs Lab 09/04/15 0618  AMMONIA 39*   CBC:  Recent Labs Lab 09/04/15 0618 09/05/15 1351 09/06/15 0547 09/07/15 2029 09/07/15 2140 09/08/15 0340 09/10/15 0325  WBC 13.6* 11.6* 11.5*  --   --  11.5* 12.5*  NEUTROABS 9.9*  --  8.3*  --   --   --   --   HGB 8.9* 9.3* 9.0* 10.2* 9.5* 7.4* 7.7*  HCT 25.4* 27.5* 26.7* 30.0* 28.0* 22.3* 24.1*  MCV 93.4 95.2 95.4  --   --  94.1 98.0  PLT 159 188 221  --   --  222 240   Cardiac Enzymes:   No results for input(s): CKTOTAL, CKMB, CKMBINDEX, TROPONINI in the last 168 hours. BNP (last 3 results) No results for input(s): BNP in the last 8760 hours.  ProBNP (last 3 results) No results for input(s): PROBNP in the last 8760 hours.  CBG: No results for input(s): GLUCAP in the last 168 hours.  Recent Results (from the past 240 hour(s))  Culture, blood (Routine X 2) w Reflex to ID Panel     Status: None   Collection Time: 09/01/15 12:20 PM  Result Value Ref Range Status   Specimen Description BLOOD RIGHT ARM  Final   Special Requests   Final    BOTTLES DRAWN AEROBIC AND ANAEROBIC 14CC EACH  IMMUNE:COMPROMISED   Culture NO GROWTH 5 DAYS  Final   Report Status 09/06/2015 FINAL  Final  Culture, blood (Routine X 2) w Reflex to ID Panel     Status: None   Collection Time: 09/01/15 12:30 PM  Result Value Ref Range Status   Specimen Description BLOOD LEFT ARM  Final   Special Requests   Final    BOTTLES DRAWN AEROBIC AND ANAEROBIC 12CC EACH  IMMUNE:COMPROMISED   Culture NO GROWTH 5 DAYS  Final   Report Status 09/06/2015 FINAL  Final  MRSA PCR Screening     Status: Abnormal   Collection Time: 09/07/15  6:56 PM  Result Value Ref Range Status   MRSA by PCR POSITIVE (A) NEGATIVE Final    Comment:        The GeneXpert MRSA Assay (FDA approved for NASAL specimens only), is one component of a comprehensive MRSA colonization surveillance program. It is not intended to diagnose  MRSA infection nor to guide or monitor treatment for MRSA infections. RESULT CALLED TO, READ BACK BY AND VERIFIED WITH: M MASON@0006  09/06/15 MKELLY   Anaerobic culture     Status: None (Preliminary result)   Collection Time: 09/07/15  8:39 PM  Result Value Ref Range Status   Specimen Description ABSCESS  Final   Special Requests EPIDURAL ABSCESS  Final   Gram Stain   Final    NO WBC SEEN NO SQUAMOUS EPITHELIAL CELLS SEEN NO ORGANISMS SEEN Performed at [facility]    Culture   Final    NO ANAEROBES ISOLATED; CULTURE IN PROGRESS FOR 5 DAYS Performed at [facility]    Report Status  PENDING  Incomplete  Culture, routine-abscess     Status: None   Collection Time: 09/07/15  8:39 PM  Result Value Ref Range Status   Specimen Description ABSCESS  Final   Special Requests EPIDURAL ABSCESS  Final   Gram Stain   Final    NO WBC SEEN NO SQUAMOUS EPITHELIAL CELLS SEEN NO ORGANISMS SEEN Performed at [facility]    Culture   Final    NO GROWTH 2 DAYS Performed at [facility]    Report Status 09/10/2015 FINAL  Final  Anaerobic culture     Status: None (Preliminary result)   Collection Time: 09/07/15  9:20 PM  Result Value Ref Range Status   Specimen Description ABSCESS  Final   Special Requests THORACIC EPIDURAL  Final   Gram Stain   Final    FEW WBC PRESENT, PREDOMINANTLY PMN NO SQUAMOUS EPITHELIAL CELLS SEEN NO ORGANISMS SEEN Performed at [facility]    Culture   Final    NO ANAEROBES ISOLATED; CULTURE IN PROGRESS FOR 5 DAYS Performed at [facility]    Report Status PENDING  Incomplete  Culture, routine-abscess     Status: None (Preliminary result)   Collection Time: 09/07/15  9:20 PM  Result Value Ref Range Status   Specimen Description ABSCESS  Final   Special Requests THORACIC EPIDURAL  Final   Gram Stain   Final    FEW WBC PRESENT, PREDOMINANTLY PMN NO SQUAMOUS EPITHELIAL CELLS SEEN NO ORGANISMS  SEEN Performed at [facility]    Culture   Final    RARE STAPHYLOCOCCUS AUREUS Note: RIFAMPIN AND GENTAMICIN SHOULD NOT BE USED AS SINGLE DRUGS FOR TREATMENT OF STAPH INFECTIONS. Performed at [facility]    Report Status PENDING  Incomplete     Studies: No results found.  Scheduled Meds: . antiseptic oral rinse  7 mL Mouth Rinse BID  . Chlorhexidine Gluconate Cloth  6 each Topical Q0600  . docusate sodium  100 mg Oral BID  . mupirocin ointment  1 application Nasal BID  . pantoprazole  40 mg Oral QHS  . senna  1 tablet Oral BID  . sodium chloride flush  3 mL Intravenous Q12H  . vancomycin  750 mg Intravenous Q12H    Continuous Infusions: . sodium chloride 100 mL/hr at 09/09/15 1952  . sodium chloride       Time spent: 15 minutes  Hollice Espy  [facility] Pager [phone] . If 7PM-7AM, please contact night-coverage at [email or web address], password [facility] 09/10/2015, 12:57 PM  LOS: 13 days

## 2015-09-10 NOTE — Care Management Note (Signed)
Case Management Note  Patient Details  Name: HUNTER BACHAR MRN: [id number] Date of Birth: 26-Sep-1945  Subjective/Objective:                    Action/Plan: Plan is for SNF at discharge. CM continuing to follow for discharge needs.   Expected Discharge Date:                  Expected Discharge Plan:  Skilled Nursing Facility  In-House Referral:  Clinical Social Work  Discharge planning Services  CM Consult  Post Acute Care Choice:    Choice offered to:     DME Arranged:    DME Agency:     HH Arranged:    HH Agency:     Status of Service:  In process, will continue to follow  Medicare Important Message Given:  Yes Date Medicare IM Given:    Medicare IM give by:    Date Additional Medicare IM Given:    Additional Medicare Important Message give by:     If discussed at Long Length of Stay Meetings, dates discussed:    Additional Comments:  Kermit Balo, RN 09/10/2015, 3:58 PM

## 2015-09-11 ENCOUNTER — Telehealth (HOSPITAL_BASED_OUTPATIENT_CLINIC_OR_DEPARTMENT_OTHER): Payer: Self-pay | Admitting: Emergency Medicine

## 2015-09-11 DIAGNOSIS — D62 Acute posthemorrhagic anemia: Secondary | ICD-10-CM

## 2015-09-11 DIAGNOSIS — D539 Nutritional anemia, unspecified: Secondary | ICD-10-CM

## 2015-09-11 LAB — BASIC METABOLIC PANEL
Anion gap: 4 — ABNORMAL LOW (ref 5–15)
BUN: 34 mg/dL — AB (ref 6–20)
CALCIUM: 7.5 mg/dL — AB (ref 8.9–10.3)
CO2: 22 mmol/L (ref 22–32)
Chloride: 108 mmol/L (ref 101–111)
Creatinine, Ser: 1.3 mg/dL — ABNORMAL HIGH (ref 0.61–1.24)
GFR calc Af Amer: >60 mL/min (ref >60)
GFR, EST NON AFRICAN AMERICAN: 55 mL/min — AB (ref >60)
GLUCOSE: 101 mg/dL — AB (ref 65–99)
Potassium: 4.5 mmol/L (ref 3.5–5.1)
Sodium: 134 mmol/L — ABNORMAL LOW (ref 135–145)

## 2015-09-11 LAB — TYPE AND SCREEN
ABO/RH(D): A POS
ANTIBODY SCREEN: NEGATIVE
Unit division: 0
Unit division: 0

## 2015-09-11 LAB — CBC
HEMATOCRIT: 21.3 % — AB (ref 39.0–52.0)
Hemoglobin: 7 g/dL — ABNORMAL LOW (ref 13.0–17.0)
MCH: 32.9 pg (ref 26.0–34.0)
MCHC: 32.9 g/dL (ref 30.0–36.0)
MCV: 100 fL (ref 78.0–100.0)
Platelets: 187 10*3/uL (ref 150–400)
RBC: 2.13 MIL/uL — ABNORMAL LOW (ref 4.22–5.81)
RDW: 21.8 % — AB (ref 11.5–15.5)
WBC: 8.3 10*3/uL (ref 4.0–10.5)

## 2015-09-11 LAB — CULTURE, ROUTINE-ABSCESS

## 2015-09-11 LAB — PREPARE RBC (CROSSMATCH)

## 2015-09-11 LAB — VANCOMYCIN, TROUGH: Vancomycin Tr: 15 ug/mL (ref 10.0–20.0)

## 2015-09-11 MED ORDER — SODIUM CHLORIDE 0.9 % IV SOLN
Freq: Once | INTRAVENOUS | Status: AC
  Administered 2015-09-11: 09:00:00 via INTRAVENOUS

## 2015-09-11 MED ORDER — FUROSEMIDE 10 MG/ML IJ SOLN
20.0000 mg | Freq: Once | INTRAMUSCULAR | Status: AC
  Administered 2015-09-11: 20 mg via INTRAVENOUS
  Filled 2015-09-11: qty 4

## 2015-09-11 MED ORDER — VANCOMYCIN HCL IN DEXTROSE 750-5 MG/150ML-% IV SOLN
750.0000 mg | Freq: Two times a day (BID) | INTRAVENOUS | Status: DC
  Administered 2015-09-11 – 2015-09-13 (×5): 750 mg via INTRAVENOUS
  Filled 2015-09-11 (×7): qty 150

## 2015-09-11 NOTE — Progress Notes (Signed)
PROGRESS NOTE  Roberto [name] RUE:[id number] DOB: 1946-04-18 DOA: 08/27/2015 PCP: Roberto Stalling, MD  HPI/Recap of past 24 hours: Patient is a 70 year old male with past medical history of alcohol abuse and secondary cirrhosis initially admitted on 1/27 with alcohol withdrawal and swollen right knee concerning for septic joint.  Seen by orthopedic surgery and knee aspirated, right knee cultures grew out MRSA as did blood cultures and patient initially started on IV antibiotics.during this time, patient was also treated for alcohol withdrawal.  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  Upon workup, patient underwent MRI of the thoracic spine and he was found to have an epidural abscess. Patient transferred from [facility] to [facility] and seen by neurosurgery.  He underwent a T5-T11 laminectomy with evacuation of epidural abscess on the evening of 2/7. Cultures were sent. Of note, blood cultures from 21 noted clearing of bacteremia. Only other complication during patient's hospitalization was acute kidney injury possibly felt to be from vancomycin although renal function now normal.  Today, hemoglobin down to 7.0. Patient himself anxious by getting blood transfused and wanted to discuss it with his son and cousin. 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 Appreciate their assistance. Status post right knee joint aspiration and thoracic laminectomy with epidural evacuation of abscess. Restarted on IV vancomycin.neurosurgery following. Recommendation for skilled nursing.  PICC line placed. Active Problems:   HTN (hypertension): monitoring blood pressures    Severe protein-calorie malnutrition (HCC)   ETOH abuse with secondary cirrhosis, hepatic encephalopathy and history of varices:  Course complicated by recent withdrawal, treated. Stable. Continue rifaximin.  Anemia secondary to macrocytosis from alcohol abuse plus acute blood loss from surgery: Down to 7.0. What to transfuse 1 unit packed red blood cells although patient resistant. Feels anxious. After extensively talking, he is going to talk it over with his family.  Acute kidney injury: Creatinine now at baseline Code Status: full code   Family Communication: Spoke with son by phone  Disposition Plan: Skilled nursing beginning of next week   Consultants:  Orthopedic surgery  Infectious disease  Neurosurgery  Gastroenterology  Procedures:  Status post laminectomy T5-T11 down to/7  Status post aspiration of knee joint done 2/1  Antibiotics:  IIV Rocephin 1/28-1/29  IV Zithromax 1/281/29  IV Zosyn 1/29-2/1  IV vancomycin 1/29-2/2, 2/8-present  IV Teflaro 2/2-2/8   Objective: BP 133/67 mmHg  Pulse 89  Temp(Src) 98.8 F (37.1 C) (Oral)  Resp 18  Ht 6\' 1"  (1.854 m)  Wt 119.477 kg (263 lb 6.4 oz)  BMI 34.76 kg/m2  SpO2 98%  Intake/Output Summary (Last 24 hours) at 09/11/15 1456 Last data filed at 09/11/15 1306  Gross per 24 hour  Intake      0 ml  Output    900 ml  Net   -900 ml   Filed Weights   08/27/15 0710 09/09/15 2330  Weight: 96.163 kg (212 lb) 119.477 kg (263 lb 6.4 oz)    Exam:   General:  Alert and oriented 3, no acute distress  Cardiovascular: regular rate and rhythm, S1-S2   Respiratory: clear to auscultation bilaterally   Abdomen: soft, nontender, nondistended, positive bowel sounds   Musculoskeletal: trace pitting edema    Data Reviewed: Basic Metabolic Panel:  Recent Labs Lab 09/07/15 0546  09/07/15 2140 09/08/15  0340 02/08657/17 0515 09/10/15 0325 09/11/15 0525  NA 131*  < > 134* 132* 133* 135 134*  K 4.2  < > 4.5 5.6* 4.9 4.9 4.5  CL 104  --   --  106 106 108 108  CO2 22  --   --  21* 20* 24 22  GLUCOSE 101*  --   --  172* 137* 117* 101*    BUN 49*  --   --  44* 43* 37* 34*  CREATININE 1.55*  --   --  1.69* 1.53* 1.44* 1.30*  CALCIUM 7.3*  --   --  7.4* 7.4* 7.8* 7.5*  < > = values in this interval not displayed. Liver Function Tests: No results for input(s): AST, ALT, ALKPHOS, BILITOT, PROT, ALBUMIN in the last 168 hours. No results for input(s): LIPASE, AMYLASE in the last 168 hours. No results for input(s): AMMONIA in the last 168 hours. CBC:  Recent Labs Lab 09/05/15 1351 09/06/15 0547 09/07/15 2029 09/07/15 2140 09/08/15 0340 09/10/15 0325 09/11/15 0525  WBC 11.6* 11.5*  --   --  11.5* 12.5* 8.3  NEUTROABS  --  8.3*  --   --   --   --   --   HGB 9.3* 9.0* 10.2* 9.5* 7.4* 7.7* 7.0*  HCT 27.5* 26.7* 30.0* 28.0* 22.3* 24.1* 21.3*  MCV 95.2 95.4  --   --  94.1 98.0 100.0  PLT 188 221  --   --  222 240 187   Cardiac Enzymes:   No results for input(s): CKTOTAL, CKMB, CKMBINDEX, TROPONINI in the last 168 hours. BNP (last 3 results) No results for input(s): BNP in the last 8760 hours.  ProBNP (last 3 results) No results for input(s): PROBNP in the last 8760 hours.  CBG: No results for input(s): GLUCAP in the last 168 hours.  Recent Results (from the past 240 hour(s))  MRSA PCR Screening     Status: Abnormal   Collection Time: 09/07/15  6:56 PM  Result Value Ref Range Status   MRSA by PCR POSITIVE (A) NEGATIVE Final    Comment:        The GeneXpert MRSA Assay (FDA approved for NASAL specimens only), is one component of a comprehensive MRSA colonization surveillance program. It is not intended to diagnose MRSA infection nor to guide or monitor treatment for MRSA infections. RESULT CALLED TO, READ BACK BY AND VERIFIED WITH: M MASON@0006  09/06/15 MKELLY   Anaerobic culture     Status: None (Preliminary result)   Collection Time: 09/07/15  8:39 PM  Result Value Ref Range Status   Specimen Description ABSCESS  Final   Special Requests EPIDURAL ABSCESS  Final   Gram Stain   Final    NO WBC SEEN NO  SQUAMOUS EPITHELIAL CELLS SEEN NO ORGANISMS SEEN Performed at [facility]    Culture   Final    NO ANAEROBES ISOLATED; CULTURE IN PROGRESS FOR 5 DAYS Performed at [facility]    Report Status PENDING  Incomplete  Culture, routine-abscess     Status: None   Collection Time: 09/07/15  8:39 PM  Result Value Ref Range Status   Specimen Description ABSCESS  Final   Special Requests EPIDURAL ABSCESS  Final   Gram Stain   Final    NO WBC SEEN NO SQUAMOUS EPITHELIAL CELLS SEEN NO ORGANISMS SEEN Performed at [facility]    Culture   Final    NO GROWTH 2 DAYS Performed at [facility]  Report Status 09/10/2015 FINAL  Final  Anaerobic culture     Status: None (Preliminary result)   Collection Time: 09/07/15  9:20 PM  Result Value Ref Range Status   Specimen Description ABSCESS  Final   Special Requests THORACIC EPIDURAL  Final   Gram Stain   Final    FEW WBC PRESENT, PREDOMINANTLY PMN NO SQUAMOUS EPITHELIAL CELLS SEEN NO ORGANISMS SEEN Performed at [facility]    Culture   Final    NO ANAEROBES ISOLATED; CULTURE IN PROGRESS FOR 5 DAYS Performed at [facility]    Report Status PENDING  Incomplete  Culture, routine-abscess     Status: None   Collection Time: 09/07/15  9:20 PM  Result Value Ref Range Status   Specimen Description ABSCESS  Final   Special Requests THORACIC EPIDURAL  Final   Gram Stain   Final    FEW WBC PRESENT, PREDOMINANTLY PMN NO SQUAMOUS EPITHELIAL CELLS SEEN NO ORGANISMS SEEN Performed at [facility]    Culture   Final    RARE METHICILLIN RESISTANT STAPHYLOCOCCUS AUREUS Note: RIFAMPIN AND GENTAMICIN SHOULD NOT BE USED AS SINGLE DRUGS FOR TREATMENT OF STAPH INFECTIONS. CRITICAL RESULT CALLED TO, READ BACK BY AND VERIFIED WITH: FAITH H. AT 9:38AM ON 09/11/2015 HAJAM Performed at [facility]    Report Status 09/11/2015 FINAL  Final   Organism ID, Bacteria METHICILLIN RESISTANT  STAPHYLOCOCCUS AUREUS  Final      Susceptibility   Methicillin resistant staphylococcus aureus - MIC*    CLINDAMYCIN <=0.25 SENSITIVE Sensitive     ERYTHROMYCIN <=0.25 SENSITIVE Sensitive     GENTAMICIN <=0.5 SENSITIVE Sensitive     LEVOFLOXACIN >=8 RESISTANT Resistant     OXACILLIN >=4 RESISTANT Resistant     RIFAMPIN <=0.5 SENSITIVE Sensitive     TRIMETH/SULFA >=320 RESISTANT Resistant     VANCOMYCIN 1 SENSITIVE Sensitive     TETRACYCLINE <=1 SENSITIVE Sensitive     * RARE METHICILLIN RESISTANT STAPHYLOCOCCUS AUREUS     Studies: No results found.  Scheduled Meds: . antiseptic oral rinse  7 mL Mouth Rinse BID  . Chlorhexidine Gluconate Cloth  6 each Topical Q0600  . docusate sodium  100 mg Oral BID  . furosemide  20 mg Intravenous Once  . mupirocin ointment  1 application Nasal BID  . pantoprazole  40 mg Oral QHS  . senna  1 tablet Oral BID  . sodium chloride flush  3 mL Intravenous Q12H  . vancomycin  750 mg Intravenous Q12H    Continuous Infusions: . sodium chloride 100 mL/hr at 09/09/15 1952  . sodium chloride       Time spent: 25 minutes  Hollice Espy  [facility] Pager [phone] . If 7PM-7AM, please contact night-coverage at [email or web address], password [facility] 09/11/2015, 2:56 PM  LOS: 14 days

## 2015-09-11 NOTE — Progress Notes (Signed)
Pharmacy Antibiotic Note  Roberto [name] is a 70 y.o. male admitted on 08/27/2015 with knee pain from injury from binge drinking .  Pharmacy has been consulted for vancomycin dosing for epidural abscess and bacteremia. MRSA bacteremia with MRI w/ thoracic epidural abscess.  POD1 laminectomy and evacuation of epidural abscess - needs 6 weeks of vancomycin  Dose was missed this am and trough scheduled at incorrect time, so "trough" of 15 will be ignored Plan: Retime vancomycin 750 mg IV q12 VT at ss with this re-timing Patient renal fx slightly better  Height:  (185.4 cm) Weight: 263 lb 6.4 oz (119.477 kg) IBW/kg (Calculated) : 79.9  Temp (24hrs), Avg:98.1 F (36.7 C), Min:97.8 F (36.6 C), Max:98.2 F (36.8 C)   Recent Labs Lab 09/05/15 1351 09/06/15 0547 09/07/15 0546 09/08/15 0340 09/09/15 0515 09/10/15 0325 09/11/15 0525 09/11/15 1027  WBC 11.6* 11.5*  --  11.5*  --  12.5* 8.3  --   CREATININE  --  1.65* 1.55* 1.69* 1.53* 1.44* 1.30*  --   VANCOTROUGH  --   --   --   --   --   --   --  15    Estimated Creatinine Clearance: 74.6 mL/min (by C-G formula based on Cr of 1.3).    No Known Allergies  Antimicrobials this admission: vanc 1/29 >> 2/2,  2/8>> Zosyn 1/29 >> 2/1 ceftaroline 2/2>> 2/8  Dose adjustments this admission: 2/1 VT 26 on 1 gm q12, dose changed to 1250 q24  Microbiology results: 1/28 blood cx MRSA 2/1 blood cx x 2 NGTD 2/7 tissue cx pending 2/7 abscess >>ngtd  Thank you for allowing pharmacy to be a part of this patient's care.  Isaac Bliss, PharmD, BCPS, [facility] Clinical Pharmacist Pager [phone] 09/11/2015 12:21 PM

## 2015-09-11 NOTE — Progress Notes (Signed)
Pt given ice cream per request.  

## 2015-09-11 NOTE — Progress Notes (Signed)
Marissa, Rn went in to get blood bank number off arm band so that unit of blood could be picked up from downstairs. No blue blood bank bracelet found on pt. A few minutes later this rn went in to adminster medications. rn found blue blood bank bracelet on pts bedside table. rn called blood bank who said that armband could be reapplied to pt and still be used. Marissa, Rn will now go down and pick up unit of blood for transfusion.

## 2015-09-11 NOTE — Progress Notes (Signed)
Hourly rounding performed. Call light within reach. Pt in no acute distress. Denies needs. Rn informed pt he was allowed to have pain medications again at 1500. Pt states he prefers to have vicodin instead of percocet.

## 2015-09-11 NOTE — Progress Notes (Signed)
pts son at bedside, son asking about blood transfusion, pt reports "he is leaning towards getting the blood transfusion, but wants to talk to [employer] first".

## 2015-09-11 NOTE — Progress Notes (Signed)
Overall stable. No new issues or problems. Wound healing well. Continue current antibiotics.

## 2015-09-11 NOTE — Progress Notes (Addendum)
rn went to get informed consent, pt states he does not want to receiving a blood transfusion today. Wants to wait at least until tomorrow, so that he can speak with his family first. Blood bank made aware. md paged

## 2015-09-12 DIAGNOSIS — I5032 Chronic diastolic (congestive) heart failure: Secondary | ICD-10-CM | POA: Diagnosis present

## 2015-09-12 LAB — ANAEROBIC CULTURE: GRAM STAIN: NONE SEEN

## 2015-09-12 LAB — BASIC METABOLIC PANEL
ANION GAP: 6 (ref 5–15)
BUN: 32 mg/dL — ABNORMAL HIGH (ref 6–20)
CALCIUM: 7.8 mg/dL — AB (ref 8.9–10.3)
CO2: 22 mmol/L (ref 22–32)
Chloride: 106 mmol/L (ref 101–111)
Creatinine, Ser: 1.43 mg/dL — ABNORMAL HIGH (ref 0.61–1.24)
GFR, EST AFRICAN AMERICAN: 57 mL/min — AB (ref >60)
GFR, EST NON AFRICAN AMERICAN: 49 mL/min — AB (ref >60)
Glucose, Bld: 129 mg/dL — ABNORMAL HIGH (ref 65–99)
Potassium: 4.8 mmol/L (ref 3.5–5.1)
Sodium: 134 mmol/L — ABNORMAL LOW (ref 135–145)

## 2015-09-12 LAB — CBC
HCT: 24 % — ABNORMAL LOW (ref 39.0–52.0)
HEMOGLOBIN: 7.9 g/dL — AB (ref 13.0–17.0)
MCH: 32 pg (ref 26.0–34.0)
MCHC: 32.9 g/dL (ref 30.0–36.0)
MCV: 97.2 fL (ref 78.0–100.0)
Platelets: 195 10*3/uL (ref 150–400)
RBC: 2.47 MIL/uL — AB (ref 4.22–5.81)
RDW: 22.8 % — ABNORMAL HIGH (ref 11.5–15.5)
WBC: 11 10*3/uL — AB (ref 4.0–10.5)

## 2015-09-12 LAB — BRAIN NATRIURETIC PEPTIDE: B Natriuretic Peptide: 57.1 pg/mL (ref 0.0–100.0)

## 2015-09-12 LAB — TYPE AND SCREEN
ABO/RH(D): A POS
Antibody Screen: NEGATIVE
UNIT DIVISION: 0

## 2015-09-12 MED ORDER — FUROSEMIDE 10 MG/ML IJ SOLN
20.0000 mg | Freq: Two times a day (BID) | INTRAMUSCULAR | Status: DC
  Administered 2015-09-12 – 2015-09-13 (×3): 20 mg via INTRAVENOUS
  Filled 2015-09-12 (×3): qty 4

## 2015-09-12 MED ORDER — SODIUM CHLORIDE 0.9% FLUSH
10.0000 mL | Freq: Two times a day (BID) | INTRAVENOUS | Status: DC
  Administered 2015-09-12: 10 mL

## 2015-09-12 MED ORDER — SODIUM CHLORIDE 0.9 % IV SOLN
INTRAVENOUS | Status: DC
  Administered 2015-09-12: 14:00:00 via INTRAVENOUS

## 2015-09-12 MED ORDER — SODIUM CHLORIDE 0.9% FLUSH
10.0000 mL | INTRAVENOUS | Status: DC | PRN
  Administered 2015-09-13 (×2): 10 mL
  Filled 2015-09-12 (×2): qty 40

## 2015-09-12 NOTE — Progress Notes (Addendum)
Called to room at 0615 by pt who was holding his PICC line that he had removed. Notified on call at [phone], received new order to insert PICC line, Pt stated he "did not know what it was" and that "it was hurting", so he "pulled it out"

## 2015-09-12 NOTE — Progress Notes (Signed)
Hourly rounding performed. Call light within reach. Pt in no acute distress. Denies needs. Pt turned. Pt informed he could get pain medication in 2 hours at 2030

## 2015-09-12 NOTE — Progress Notes (Signed)
Peripherally Inserted Central Catheter/Midline Placement  The IV Nurse has discussed with the patient and/or persons authorized to consent for the patient, the purpose of this procedure and the potential benefits and risks involved with this procedure.  The benefits include less needle sticks, lab draws from the catheter and patient may be discharged home with the catheter.  Risks include, but not limited to, infection, bleeding, blood clot (thrombus formation), and puncture of an artery; nerve damage and irregular heat beat.  Alternatives to this procedure were also discussed.  Previous consent used.  Pt. Agreeable to have PICC replaced.  PICC/Midline Placement Documentation  PICC Single Lumen 09/12/15 PICC Right Brachial 37 cm 0 cm (Active)  Indication for Insertion or Continuance of Line Administration of hyperosmolar/irritating solutions (i.e. TPN, Vancomycin, etc.) 09/12/2015  8:01 AM  Exposed Catheter (cm) 0 cm 09/12/2015  8:01 AM  Site Assessment Clean;Dry;Intact;Other (Comment) 09/12/2015  8:01 AM  Line Status Flushed;Saline locked;Blood return noted 09/12/2015  8:01 AM  Dressing Type Transparent 09/12/2015  8:01 AM  Dressing Status Clean;Dry;Intact 09/12/2015  8:01 AM  Dressing Intervention New dressing 09/12/2015  8:01 AM  Dressing Change Due 09/19/15 09/12/2015  8:01 AM       Ethelda Chick 09/12/2015, 8:19 AM

## 2015-09-12 NOTE — Progress Notes (Signed)
0140 received call from IV team advising that they had not received order for for CBC, DBIV for post transfusion. Orders were placed at 1949 as ASAP and again at 2343 STAT, however IV team state that due to order being placed as continuous instead of one time that they did not receive it. IV team did advise that were en route for the DBIV at this time.

## 2015-09-12 NOTE — Progress Notes (Signed)
Hourly rounding performed. Call light within reach. Pt in no acute distress. Denies needs.  Pt informed he could have more pain medicine in about 1 hour.  

## 2015-09-12 NOTE — Progress Notes (Signed)
PROGRESS NOTE  Roberto [name] WUJ:[id number] DOB: Jun 14, 1946 DOA: 08/27/2015 PCP: Isabella Stalling, MD  HPI/Recap of past 24 hours: Patient is a 70 year old male with past medical history of alcohol abuse and secondary cirrhosis initially admitted on 1/27 with alcohol withdrawal and swollen right knee concerning for septic joint.  Seen by orthopedic surgery and knee aspirated, right knee cultures grew out MRSA as did blood cultures and patient initially started on IV antibiotics.during this time, patient was also treated for alcohol withdrawal.  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  Upon workup, patient underwent MRI of the thoracic spine and he was found to have an epidural abscess. Patient transferred from [facility] to [facility] and seen by neurosurgery.  He underwent a T5-T11 laminectomy with evacuation of epidural abscess on the evening of 2/7. Cultures were sent. Of note, blood cultures from 21 noted clearing of bacteremia. Only other complication during patient's hospitalization was acute kidney injury possibly felt to be from vancomycin although renal function now normal.  Hemoglobin down to 7.0 on 2/11. Patient initially resistant until he had talked his son, but after, amenable to getting blood transfusion received 1 unit packed red blood cells and today, hemoglobin at 7.9. Overnight, PICC line became dislodged and replacement PICC placed. 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 Appreciate their assistance. Status post right knee joint aspiration and thoracic laminectomy with epidural evacuation of abscess. Restarted on IV vancomycin.neurosurgery following. Recommendation for skilled nursing.  PICC line placed. Active Problems:   HTN (hypertension):  monitoring blood pressures    Severe protein-calorie malnutrition (HCC): Nutrition to see  Chronic diastolic heart failure: Daily weights have not been checked, checking now. Patient about 5 L positive. Checking BNP. Have started IV Lasix.    ETOH abuse with secondary cirrhosis, hepatic encephalopathy and history of varices: Course complicated by recent withdrawal, treated. Stable. Continue rifaximin.  Anemia secondary to macrocytosis from alcohol abuse plus acute blood loss from surgery: Down to 7.0. Transfuse 1 unit packed red blood cells  Acute kidney injury: Creatinine only minimally elevated. Code Status: full code   Family Communication: Spoke with son on 2/11  Disposition Plan: Skilled nursing beginning of next week, once fully diuresed   Consultants:  Orthopedic surgery  Infectious disease  Neurosurgery  Gastroenterology  Procedures:  Status post laminectomy T5-T11 down 2/7  Status post aspiration of knee joint done 2/1  Status post 1 unit packed red blood cell transfusion done 2/11  Antibiotics:  IIV Rocephin 1/28-1/29  IV Zithromax 1/281/29  IV Zosyn 1/29-2/1  IV vancomycin 1/29-2/2, 2/8-present  IV Teflaro 2/2-2/8   Objective: BP 104/57 mmHg  Pulse 68  Temp(Src) 98.1 F (36.7 C) (Oral)  Resp 18  Ht  (1.854 m)  Wt 119.477 kg (263 lb 6.4 oz)  BMI 34.76 kg/m2  SpO2 99%  Intake/Output Summary (Last 24 hours) at 09/12/15 1305 Last data filed at 09/12/15 0930  Gross per 24 hour  Intake    825 ml  Output   1420 ml  Net   -595 ml   Filed Weights   08/27/15 0710 09/09/15 2330  Weight: 96.163 kg (212 lb) 119.477 kg (263 lb 6.4 oz)    Exam: Unchanged from previous day  General:  Alert and oriented 3, no acute distress  Cardiovascular: regular  rate and rhythm, S1-S2   Respiratory: clear to auscultation bilaterally   Abdomen: soft, nontender, nondistended, positive bowel sounds   Musculoskeletal: trace pitting edema    Data  Reviewed: Basic Metabolic Panel:  Recent Labs Lab 09/08/15 0340 09/09/15 0515 09/10/15 0325 09/11/15 0525 09/12/15 0212  NA 132* 133* 135 134* 134*  K 5.6* 4.9 4.9 4.5 4.8  CL 106 106 108 108 106  CO2 21* 20* 24 22 22   GLUCOSE 172* 137* 117* 101* 129*  BUN 44* 43* 37* 34* 32*  CREATININE 1.69* 1.53* 1.44* 1.30* 1.43*  CALCIUM 7.4* 7.4* 7.8* 7.5* 7.8*   Liver Function Tests: No results for input(s): AST, ALT, ALKPHOS, BILITOT, PROT, ALBUMIN in the last 168 hours. No results for input(s): LIPASE, AMYLASE in the last 168 hours. No results for input(s): AMMONIA in the last 168 hours. CBC:  Recent Labs Lab 09/06/15 0547  09/07/15 2140 09/08/15 0340 09/10/15 0325 09/11/15 0525 09/12/15 0213  WBC 11.5*  --   --  11.5* 12.5* 8.3 11.0*  NEUTROABS 8.3*  --   --   --   --   --   --   HGB 9.0*  < > 9.5* 7.4* 7.7* 7.0* 7.9*  HCT 26.7*  < > 28.0* 22.3* 24.1* 21.3* 24.0*  MCV 95.4  --   --  94.1 98.0 100.0 97.2  PLT 221  --   --  222 240 187 195  < > = values in this interval not displayed. Cardiac Enzymes:   No results for input(s): CKTOTAL, CKMB, CKMBINDEX, TROPONINI in the last 168 hours. BNP (last 3 results) No results for input(s): BNP in the last 8760 hours.  ProBNP (last 3 results) No results for input(s): PROBNP in the last 8760 hours.  CBG: No results for input(s): GLUCAP in the last 168 hours.  Recent Results (from the past 240 hour(s))  MRSA PCR Screening     Status: Abnormal   Collection Time: 09/07/15  6:56 PM  Result Value Ref Range Status   MRSA by PCR POSITIVE (A) NEGATIVE Final    Comment:        The GeneXpert MRSA Assay (FDA approved for NASAL specimens only), is one component of a comprehensive MRSA colonization surveillance program. It is not intended to diagnose MRSA infection nor to guide or monitor treatment for MRSA infections. RESULT CALLED TO, READ BACK BY AND VERIFIED WITH: M [email or web address]  09/06/15 MKELLY   Anaerobic culture     Status:  None   Collection Time: 09/07/15  8:39 PM  Result Value Ref Range Status   Specimen Description ABSCESS  Final   Special Requests EPIDURAL ABSCESS  Final   Gram Stain   Final    NO WBC SEEN NO SQUAMOUS EPITHELIAL CELLS SEEN NO ORGANISMS SEEN Performed at [facility]    Culture   Final    NO ANAEROBES ISOLATED Performed at [facility]    Report Status 09/12/2015 FINAL  Final  Culture, routine-abscess     Status: None   Collection Time: 09/07/15  8:39 PM  Result Value Ref Range Status   Specimen Description ABSCESS  Final   Special Requests EPIDURAL ABSCESS  Final   Gram Stain   Final    NO WBC SEEN NO SQUAMOUS EPITHELIAL CELLS SEEN NO ORGANISMS SEEN Performed at [facility]    Culture   Final    NO GROWTH 2 DAYS Performed at [facility]    Report Status 09/10/2015 FINAL  Final  Anaerobic culture     Status: None   Collection Time: 09/07/15  9:20 PM  Result Value Ref Range Status   Specimen Description ABSCESS  Final   Special Requests THORACIC EPIDURAL  Final   Gram Stain   Final    FEW WBC PRESENT, PREDOMINANTLY PMN NO SQUAMOUS EPITHELIAL CELLS SEEN NO ORGANISMS SEEN Performed at [facility]    Culture   Final    NO ANAEROBES ISOLATED Performed at [facility]    Report Status 09/12/2015 FINAL  Final  Culture, routine-abscess     Status: None   Collection Time: 09/07/15  9:20 PM  Result Value Ref Range Status   Specimen Description ABSCESS  Final   Special Requests THORACIC EPIDURAL  Final   Gram Stain   Final    FEW WBC PRESENT, PREDOMINANTLY PMN NO SQUAMOUS EPITHELIAL CELLS SEEN NO ORGANISMS SEEN Performed at [facility]    Culture   Final    RARE METHICILLIN RESISTANT STAPHYLOCOCCUS AUREUS Note: RIFAMPIN AND GENTAMICIN SHOULD NOT BE USED AS SINGLE DRUGS FOR TREATMENT OF STAPH INFECTIONS. CRITICAL RESULT CALLED TO, READ BACK BY AND VERIFIED WITH: FAITH H. AT 9:38AM ON 09/11/2015  HAJAM Performed at [facility]    Report Status 09/11/2015 FINAL  Final   Organism ID, Bacteria METHICILLIN RESISTANT STAPHYLOCOCCUS AUREUS  Final      Susceptibility   Methicillin resistant staphylococcus aureus - MIC*    CLINDAMYCIN <=0.25 SENSITIVE Sensitive     ERYTHROMYCIN <=0.25 SENSITIVE Sensitive     GENTAMICIN <=0.5 SENSITIVE Sensitive     LEVOFLOXACIN >=8 RESISTANT Resistant     OXACILLIN >=4 RESISTANT Resistant     RIFAMPIN <=0.5 SENSITIVE Sensitive     TRIMETH/SULFA >=320 RESISTANT Resistant     VANCOMYCIN 1 SENSITIVE Sensitive     TETRACYCLINE <=1 SENSITIVE Sensitive     * RARE METHICILLIN RESISTANT STAPHYLOCOCCUS AUREUS     Studies: No results found.  Scheduled Meds: . antiseptic oral rinse  7 mL Mouth Rinse BID  . Chlorhexidine Gluconate Cloth  6 each Topical Q0600  . docusate sodium  100 mg Oral BID  . furosemide  20 mg Intravenous Q12H  . mupirocin ointment  1 application Nasal BID  . pantoprazole  40 mg Oral QHS  . senna  1 tablet Oral BID  . sodium chloride flush  10-40 mL Intracatheter Q12H  . vancomycin  750 mg Intravenous Q12H    Continuous Infusions:     Time spent: 25 minutes  Hollice Espy  [facility] Pager [phone] . If 7PM-7AM, please contact night-coverage at [email or web address], password [facility] 09/12/2015, 1:05 PM  LOS: 15 days

## 2015-09-12 NOTE — Progress Notes (Signed)
Hourly rounding performed. Call light within reach. Pt in no acute distress. Denies needs.   rn gave pt list of 2 sons phone numbers so that he can call family

## 2015-09-12 NOTE — Progress Notes (Signed)
Pt requesting more pain meds, rn informed pt at what time pt could get more pain medication, reminded pt that he just received a muscle relaxer. Pt off floor to get another PICC line placed. Pt states he is very anxious about PICC line and situation.

## 2015-09-12 NOTE — Progress Notes (Signed)
Pt made aware he can get more pain medicine at 1230

## 2015-09-12 NOTE — Progress Notes (Signed)
No new issues or problems.

## 2015-09-12 NOTE — Progress Notes (Deleted)
Hourly rounding performed. Call light within reach. Pt in no acute distress. Denies needs.  Pt informed he could have more pain medicine in about 1 hour.

## 2015-09-13 DIAGNOSIS — E440 Moderate protein-calorie malnutrition: Secondary | ICD-10-CM | POA: Insufficient documentation

## 2015-09-13 LAB — BASIC METABOLIC PANEL
ANION GAP: 4 — AB (ref 5–15)
BUN: 30 mg/dL — ABNORMAL HIGH (ref 6–20)
CALCIUM: 7.5 mg/dL — AB (ref 8.9–10.3)
CHLORIDE: 108 mmol/L (ref 101–111)
CO2: 21 mmol/L — AB (ref 22–32)
Creatinine, Ser: 1.43 mg/dL — ABNORMAL HIGH (ref 0.61–1.24)
GFR calc non Af Amer: 49 mL/min — ABNORMAL LOW (ref >60)
GFR, EST AFRICAN AMERICAN: 57 mL/min — AB (ref >60)
Glucose, Bld: 102 mg/dL — ABNORMAL HIGH (ref 65–99)
POTASSIUM: 4.3 mmol/L (ref 3.5–5.1)
Sodium: 133 mmol/L — ABNORMAL LOW (ref 135–145)

## 2015-09-13 LAB — VANCOMYCIN, TROUGH: Vancomycin Tr: 23 ug/mL — ABNORMAL HIGH (ref 10.0–20.0)

## 2015-09-13 MED ORDER — HYDROCODONE-ACETAMINOPHEN 5-325 MG PO TABS: 1.0000 | ORAL_TABLET | Freq: Four times a day (QID) | ORAL | Status: DC | PRN

## 2015-09-13 MED ORDER — CARISOPRODOL 350 MG PO TABS: 350.0000 mg | ORAL_TABLET | Freq: Three times a day (TID) | ORAL | Status: AC | PRN

## 2015-09-13 MED ORDER — HEPARIN SOD (PORK) LOCK FLUSH 100 UNIT/ML IV SOLN
250.0000 [IU] | Freq: Every day | INTRAVENOUS | Status: DC
  Filled 2015-09-13: qty 3

## 2015-09-13 MED ORDER — VANCOMYCIN HCL IN DEXTROSE 750-5 MG/150ML-% IV SOLN: 750.0000 mg | Freq: Two times a day (BID) | INTRAVENOUS | Status: DC

## 2015-09-13 MED ORDER — HEPARIN SOD (PORK) LOCK FLUSH 100 UNIT/ML IV SOLN
250.0000 [IU] | INTRAVENOUS | Status: DC | PRN
  Filled 2015-09-13: qty 3

## 2015-09-13 NOTE — Discharge Summary (Signed)
Discharge Summary  Roberto [name] JKD:[id number] DOB: 06-Sep-1945  PCP: Maricela Curet, MD  Admit date: 08/27/2015 Discharge date: 09/13/2015  Time spent: 25 minutes   Recommendations for Outpatient Follow-up:  1.  Patient being discharged to skilled nursing facility. 2. New medication: IV vancomycin currently set at 750 mg every 12 hours until April 4 3. Patient will need vancomycin levels followed closely with weekly vancomycin trough, weekly CBC and twice a week be met watching renal function with vancomycin goal of 15-20 4. If patient develops acute kidney injury while on vancomycin, switched to daptomycin at 8 mg/kg dosing 5. Patient will follow-up with infectious disease clinic in 4-6 weeks 6. Patient will follow-up with neurosurgery in 2-3 weeks  Discharge Diagnoses:  Active Hospital Problems   Diagnosis Date Noted  . 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 Chronic diastolic heart failure ([place]) 09/12/2015  . ETOH abuse 08/27/2015  . Severe protein-calorie malnutrition ([place]) 05/19/2012  . Alcohol withdrawal ([place]) 05/18/2012  . Cirrhosis ([place]) 12/16/2011  . HTN (hypertension) 08/17/2011    Resolved Hospital Problems   Diagnosis Date Noted Date Resolved  No resolved problems to display.    Discharge Condition: Improved, being discharged to skilled nursing facility   Diet recommendation: Heart healthy   Filed Weights   09/09/15 2330 09/12/15 1401 09/13/15 0500  Weight: 119.477 kg (263 lb 6.4 oz) 115.577 kg (254 lb 12.8 oz) 116.983 kg (257 lb 14.4 oz)    History of present illness:  Patient is a 70 year old male with past medical history of alcohol abuse and secondary cirrhosis initially admitted on 1/27 with alcohol withdrawal and swollen right knee concerning for septic joint. Seen by orthopedic surgery and knee aspirated, right knee cultures grew out MRSA as did blood cultures and patient initially started on IV antibiotics.during this time, patient was also treated for  alcohol withdrawal.  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 Upon workup, patient underwent MRI of the thoracic spine and he was found to have an epidural abscess. Patient transferred from [facility] to [facility] and seen by neurosurgery.   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 initial blood cultures positive for MRSA. Cultures were sent. Of note, blood cultures from 2/11 noted clearing of bacteremia.  patient seen by infectious disease who recommended 8 weeks total of IV antibiotics starting 2/8 until 4/4. Patient has had a PICC line placed. Started on IV vancomycin. Infectious disease recommended monitoring closely for any nephrotoxicity from vancomycin and switch over to daptomycin should creatinine start to rise. Active Problems:   HTN (hypertension): Stable, continue antihypertensives on discharge   severe protein calorie malnutrition: Patient  meets criteria in the context of chronic illness, continued on shake supplementation    ETOH abuse with secondary cirrhosis, hepatic encephalopathy and history of varices: Coarse, K by recent withdrawal before transfer to [facility]. Stable. Continued on rifaximin.    Chronic diastolic heart failure ([place]): Remained euvolemic during this hospitalization. BNP checked prior to discharge normal. Weight stable.   acute kidney injury: Creatinine minimally increased, initially concerns for vancomycin nephrotoxicity, but with IV fluids stabilized. At this time, infectious disease recommends continuing renally dosed vancomycin and if he has worsening acute kidney injury, change to daptomycin   Consultants:  Orthopedic surgery  Infectious disease  Neurosurgery  Gastroenterology  Procedures:  Status post laminectomy  T5-T11 down 2/7  Status post aspiration of knee joint done 2/1  Status post 1 unit packed red blood cell transfusion done 2/11  PICC line placement done 210, line dislodged and PICC line replaced on 2/12  Discharge Exam: BP 119/67 mmHg  Pulse 70  Temp(Src) 98 F (36.7 C) (Oral)  Resp 18  Ht '6\' 1"'$  (1.854 m)  Wt 116.983 kg (257 lb 14.4 oz)  BMI 34.03 kg/m2  SpO2 100%  [place] and oriented 3 Cardiovascular: Regular rate and rhythm, S1-S2   Respiratory: clear to auscultation bilaterally    Discharge Instructions You were cared for by a hospitalist during your hospital stay. If you have any questions about your discharge medications or the care you received while you were in the hospital after you are discharged, you can call the unit and asked to speak with the hospitalist on call if the hospitalist that took care of you is not available. Once you are discharged, your primary care physician will handle any further medical issues. Please note that NO REFILLS for any discharge medications will be authorized once you are discharged, as it is imperative that you return to your primary care physician (or establish a relationship with a primary care physician if you do not have one) for your aftercare needs so that they can reassess your need for medications and monitor your lab values.  Discharge Instructions    Diet - low sodium heart healthy    Complete by:  As directed      Increase activity slowly    Complete by:  As directed             Medication List    STOP taking these medications        Oxycodone HCl 10 MG Tabs      TAKE these medications        albuterol (2.5 MG/3ML) 0.083% nebulizer solution  Commonly known as:  PROVENTIL  Take 3 mLs (2.5 mg total) by nebulization every 6 (six) hours as needed for wheezing or shortness of breath.     BENGAY ARTHRITIS FORMULA EX  Apply 1 application topically 3 (three) times daily as needed.     carisoprodol 350 MG tablet    Commonly known as:  SOMA  Take 1 tablet (350 mg total) by mouth 3 (three) times daily as needed for muscle spasms.     diphenhydrAMINE 25 MG tablet  Commonly known as:  BENADRYL  Take 1 tablet (25 mg total) by mouth at bedtime as needed for sleep.     furosemide 20 MG tablet  Commonly known as:  LASIX  Take 20 mg by mouth daily.     HYDROcodone-acetaminophen 5-325 MG tablet  Commonly known as:  NORCO/VICODIN  Take 1 tablet by mouth every 6 (six) hours as needed for severe pain.     hydroxypropyl methylcellulose / hypromellose 2.5 % ophthalmic solution  Commonly known as:  ISOPTO TEARS / GONIOVISC  Place 1 drop into both eyes as needed for dry eyes. Reported on 07/22/2015     ibuprofen 200 MG tablet  Commonly known as:  ADVIL,MOTRIN  Take 800 mg by mouth every 6 (six) hours as needed for mild pain.     lactulose 10 GM/15ML solution  Commonly known as:  CHRONULAC  Take 15 mLs by mouth 3 (three) times daily as needed.     omeprazole 20 MG capsule  Commonly known as:  PRILOSEC  Take 20 mg  by mouth daily.     spironolactone 25 MG tablet  Commonly known as:  ALDACTONE  Take 1 tablet (25 mg total) by mouth daily.     Vancomycin 750 MG/150ML Soln  Commonly known as:  VANCOCIN  Inject 150 mLs (750 mg total) into the vein every 12 (twelve) hours.       No Known Allergies     Follow-up Information    Follow up with [facility] .   Specialty:  [place] information:   [address] [place] [phone]       The results of significant diagnostics from this hospitalization (including imaging, microbiology, ancillary and laboratory) are listed below for reference.    Significant Diagnostic Studies: Ct Abdomen Pelvis Wo Contrast  09/01/2015  CLINICAL DATA:  Inpatient. Acute constipation. Abdominal distention and bloating. Cirrhosis. EXAM: CT ABDOMEN AND PELVIS WITHOUT CONTRAST TECHNIQUE: Multidetector CT  imaging of the abdomen and pelvis was performed following the standard protocol without IV contrast. COMPARISON:  Abdominal radiograph from earlier today. 05/18/2012 CT abdomen/ pelvis. FINDINGS: Images are motion degraded. Lower chest: Small layering left pleural effusion . Mild dependent atelectasis in the lingula and both lower lobes. Coronary atherosclerosis. Hepatobiliary: Diffuse hepatic steatosis. Diffusely irregular liver surface, in keeping with cirrhosis, unchanged. No gross liver mass. There is layering dense material in the nondistended gallbladder, likely representing vicarious excretion of recently administered IV contrast. No gallbladder wall thickening. No biliary ductal dilatation. Pancreas: Normal, with no mass or duct dilation. Spleen: Normal size. No mass. Adrenals/Urinary Tract: Normal adrenals. Mild fullness of the central renal collecting systems bilaterally, without overt hydronephrosis, probably due to the prominently distended urinary bladder. No bladder wall thickening. No urolithiasis. No contour deforming renal mass. Stomach/Bowel: Grossly normal stomach. Normal caliber small bowel with no small bowel wall thickening. Normal appendix. Oral contrast progresses to the ascending colon. There is prominent gaseous distention of the cecum, ascending colon, hepatic flexure of the colon and transverse colon. There is a gradual transition to a normal caliber descending colon. Mild gaseous distention of the proximal sigmoid colon. Collapsed distal sigmoid colon and rectum. No large bowel wall thickening or pericolonic fat stranding. Tiny gas bubbles layering in the cecum and appendix are favored to represent liquid stool, with no definite pneumatosis. No colonic diverticulosis. Rectal drain is in place with the tip in the lower rectum. Vascular/Lymphatic: Atherosclerotic nonaneurysmal abdominal aorta. No pathologically enlarged lymph nodes in the abdomen or pelvis. Reproductive: Mild  prostatomegaly. Nonspecific internal prostatic calcifications. Other: No pneumoperitoneum. Small volume ascites predominantly perihepatic. Small left inguinal hernia containing fat and ascitic fluid . Musculoskeletal: No aggressive appearing focal osseous lesions. There is apparent fragmentation of the visualized lower T8 vertebral body with associated paraspinal soft tissue swelling. Stable chronic moderate T12 vertebral compression fracture. Stable chronic mild L1 vertebral compression fracture. Mild compression fracture of the L3 vertebral body with large Schmorl's nodes, which is new since 05/18/2012. Moderate degenerative changes in the visualized thoracolumbar spine. IMPRESSION: 1. Moderate adynamic ileus of the colon. No evidence of bowel obstruction. 2. Apparent fragmentation of the visualized lower T8 vertebral body with associated paraspinal soft tissue swelling, suggesting an acute incompletely evaluated process such as a compression fracture. Dedicated thoracic spine radiographs or thoracic spine CT or MRI are advised. 3. Mild L3 vertebral compression fracture with large Schmorl's nodes, of uncertain chronicity, new since 05/18/2012. Stable chronic moderate T12 and mild L1 vertebral compression fractures. 4. Cirrhosis. Diffuse hepatic  steatosis. No gross liver mass. Small volume ascites. 5. Distended bladder with fullness of the renal collecting systems without overt hydronephrosis. Mild prostatomegaly. Please exclude bladder outlet obstruction clinically. 6. Small layering left pleural effusion. 7. Coronary atherosclerosis. 8. Small left inguinal hernia. Electronically Signed   By: Ilona Sorrel M.D.   On: 09/01/2015 19:23   Dg Chest 2 View  08/27/2015  CLINICAL DATA:  Shortness of breath and chest pain EXAM: CHEST  2 VIEW COMPARISON:  August 03, 2015 and June 01, 2015 FINDINGS: There is no edema or consolidation. The heart size and pulmonary vascularity are normal. No adenopathy. There is stable  anterior wedging of the T12 and L1 vertebral bodies. There is anterior wedging of the T8 vertebral body which was not present on prior study from approximately 3 months prior. There is a stent in the left anterior descending coronary artery. IMPRESSION: No edema or consolidation. Anterior wedging of the T8 vertebral body, a finding not present 3 months prior. Anterior wedging of the T12 and L1 vertebral bodies is stable. There is a stent in the left anterior descending coronary artery. Electronically Signed   By: Lowella Grip III M.D.   On: 08/27/2015 08:20   Dg Thoracolumabar Spine  09/07/2015  CLINICAL DATA:  Thoracic laminectomy. EXAM: THORACOLUMBAR SPINE 1V COMPARISON:  MRI earlier today. FINDINGS: Multiple intraoperative lateral films are presented after surgery. Surgical instruments are seen in the soft tissues dorsal to the thoracic spine. A surgical instrument from posteriorly is localized to the chronic compression fracture at T12. IMPRESSION: As above. Electronically Signed   By: Staci Righter M.D.   On: 09/07/2015 23:38   Dg Abd 1 View  09/01/2015  CLINICAL DATA:  Abdominal distention. History of multiple medical problems including cirrhosis, GI bleed and renal insufficiency. EXAM: ABDOMEN - 1 VIEW COMPARISON:  Radiographs 08/27/2015.  CT 05/18/2012. FINDINGS: Portions of the abdomen are excluded from this single supine portable radiograph. There is moderate diffuse distention of the colon. There may be mild small bowel distension as well. There is no supine evidence of free intraperitoneal air. No definite signs of recurrent ascites are seen by the current examination. There are no suspicious abdominal calcifications. Compression deformities at T12, L1 and L3 appear grossly unchanged. IMPRESSION: Diffuse bowel distention, primarily appearing to reflect colon. Evaluation is limited by supine radiography, and distal colonic obstruction cannot be excluded. Consider radiographic follow up or CT for  further evaluation. Electronically Signed   By: Richardean Sale M.D.   On: 09/01/2015 12:10   Mr Brain Wo Contrast  09/06/2015  CLINICAL DATA:  New onset RIGHT leg weakness today, assess for mass or infarct. History of COPD, hypertension, cirrhosis, alcohol and substance abuse. EXAM: MRI HEAD WITHOUT CONTRAST TECHNIQUE: Multiplanar, multiecho pulse sequences of the brain and surrounding structures were obtained without intravenous contrast. Patient unable to receive contrast due to GFR of 34. COMPARISON:  CT head Dec 04, 2014 FINDINGS: Sequences are mild to moderately motion degraded. The ventricles and sulci are mildly proportionally prominent for age patient's age. No abnormal parenchymal signal, mass lesions, mass effect. Patchy supratentorial white matter T2 hyperintensities. No reduced diffusion to suggest acute ischemia. Subcentimeter faint RIGHT mesial frontal lobe susceptibility artifact, nonspecific. No abnormal extra-axial fluid collections. No extra-axial masses though, contrast enhanced sequences would be more sensitive. Normal major intracranial vascular flow voids seen at the skull base. Ocular globes and orbital contents are unremarkable though not tailored for evaluation. No abnormal sellar expansion. No suspicious calvarial bone  marrow signal. Craniocervical junction maintained. Visualized paranasal sinuses and mastoid air cells are well-aerated. Patient is edentulous. IMPRESSION: No acute intracranial process on this motion degraded examination. Mild global parenchymal brain volume loss for age. Mild chronic small vessel ischemic disease. Electronically Signed   By: Elon Alas M.D.   On: 09/06/2015 00:49   Mr Cervical Spine Wo Contrast  09/07/2015  CLINICAL DATA:  70 year old hypertensive alcoholic male of bilateral leg weakness. Staph aureus septicemia on treatment. Bilateral leg weakness. Renal failure. Anemia. Subsequent encounter. EXAM: MRI THORACIC SPINE WITHOUT CONTRAST MRI CERVICAL  SPINE WITHOUT CONTRAST TECHNIQUE: Multiplanar, multisequence MR imaging of the cervical spine was performed. No intravenous contrast was administered. Multiplanar, multisequence MR imaging of the thoracic spine was performed. No intravenous contrast was administered. COMPARISON:  09/05/2015 lumbar spine MR. 09/01/2015 CT abdomen and pelvis. 12/04/2014 cervical spine CT. FINDINGS: MR CERVICAL SPINE Exam is motion degraded. Diffuse decreased signal intensity of bone marrow may be related to patient's anemia. Fracture of the anterior inferior aspect of the C2 vertebral body of indeterminate age. Mild edema surrounding the fracture site suggesting that fracture has not completely healed radiographically. Minimal incongruent at this level on the 12/04/2014 exam and therefore fracture may be chronic although not completely healed. No adjacent prevertebral edema/hematoma as expected with an acute fracture. Nonspecific edema within paraspinal musculature C3 through C5 level on the right. This may be reflective result of acute soft tissue injury or myositis. Given the below described findings, infection not excluded although felt to be less likely consideration. Intracranial atrophy. Cervical medullary junction unremarkable. No focal cervical cord signal abnormality. C2-3: Minimal narrowing ventral thecal sac. Minimal foraminal narrowing. C3-4: Facet degenerative changes. Minimal anterior slip C3. Bulge. Narrowing ventral thecal sac. Mild bilateral foraminal narrowing. C4-5: Bulge/broad-based protrusion. Buckling posterior ligaments. Moderate spinal stenosis with circumferential moderate cord flattening. Uncinate hypertrophy and facet degenerative changes. Moderate bilateral foraminal narrowing. C5-6: Broad-based disc osteophyte complex. Spinal stenosis with mild cord flattening. Uncinate hypertrophy and facet degenerative changes. Moderate bilateral foraminal narrowing. C6-7: Facet degenerative changes greater on the left.  Mild anterior slip C6. Disc osteophyte complex greater on left. Flattening of the cord greater on left. Facet degenerative changes and uncinate hypertrophy with moderate right-sided and marked left-sided foraminal narrowing. C7-T1: Remote Schmorl's node deformity T1. Facet degenerative changes. No significant spinal stenosis. Minimal foraminal narrowing. MR THORACIC SPINE Patient was not able to complete second side of axial imaging. Exam is motion degraded. Baseline decreased signal intensity of bone marrow which may be related to patient's underlying anemia. Acute T8 compression fracture with 60% loss of height. Mild retropulsion posterior inferior aspect of the compressed vertebra. Edema extends into posterior elements. Surrounding soft tissue/fluid collection. Of note the is an epidural collection which extends from the upper T9 level to the mid T5 level. This is greatest in a right posterior lateral position at the T5 and T6 level, greatest right lateral position at the T8-9 level and greatest in a ventral right paracentral position at the upper T9 level. This causes mass effect upon the adjacent cord which is displaced anteriorly and to left at the T5 and T6 level. Suggestion of increased signal within the compress cord which may represent edema. Etiology of the epidural process is indeterminate. In alcoholic patient, compression fracture of T8 may be related to recent fall with epidural component related to blood however patient has Staph Aures septicemia and therefore epidural abscess is also consideration. T8-9 bilateral foraminal narrowing. Retropulsion compressed T8 vertebral body contributes  to spinal stenosis and cord flattening. Remote Schmorl's node deformity superior endplate T1. Remote anterior wedge compression deformity involving superior endplate T12 with 55% loss of height anteriorly and mild retropulsion posterior superior aspect with narrowing of the ventral aspect of thecal sac and mild  flattening of the ventral cord which is slightly posteriorly displaced. Remote L1 superior endplate compression fracture with 20% loss of height. IMPRESSION: MR THORACIC SPINE Patient was not able to complete second side of axial imaging. Exam is motion degraded. Baseline decreased signal intensity of bone marrow which may be related to patient's underlying anemia. Acute T8 compression fracture with 60% loss of height. Mild retropulsion posterior inferior aspect of the compressed vertebra. Edema extends into posterior elements. Surrounding soft tissue/fluid collection. Spinal stenosis with mild flattening of the adjacent cord. Bilateral T8-9 foraminal narrowing. Epidural collection extends from the upper T9 level to the mid T5 level. This is greatest in a right posterior lateral position at the T5 and T6 level, greatest right lateral position at the T8-9 level and greatest in a ventral right paracentral position at the upper T9 level. This causes mass effect upon the adjacent cord which is displaced anteriorly and to left at the T5 and T6 level. Suggestion of increased signal within the compress cord which may represent edema. Etiology of the epidural process is indeterminate. In alcoholic patient, compression fracture of T8 may be related to recent fall with epidural component related to blood however patient has Staph Aures septicemia and therefore epidural abscess is also consideration. Remote Schmorl's node deformity superior endplate T1. Remote anterior wedge compression deformity involving superior endplate T12 with 97% loss of height anteriorly and mild retropulsion posterior superior aspect with narrowing of the ventral aspect of thecal sac and mild flattening of the ventral cord which is slightly posteriorly displaced. Remote L1 superior endplate compression fracture with 20% loss of height. MR CERVICAL SPINE Exam is motion degraded. Diffuse decreased signal intensity of bone marrow may be related to  patient's anemia. Fracture of the anterior inferior aspect of the C2 vertebral body of indeterminate age. Mild edema surrounding the fracture site suggesting that fracture has not completely healed radiographically. Minimal incongruent at this level on the 12/04/2014 exam and therefore fracture may be chronic although not completely healed. No adjacent prevertebral edema/hematoma as expected with an acute fracture. Nonspecific edema within paraspinal musculature C3 through C5 level on the right. This may be reflective result of acute soft tissue injury or myositis. Given the above described findings, infection not excluded although felt to be less likely consideration. C3-4 bulge. Narrowing ventral thecal sac. Mild bilateral foraminal narrowing. C4-5 multifactorial moderate spinal stenosis with circumferential moderate cord flattening. Moderate bilateral foraminal narrowing. C5-6 broad-based disc osteophyte complex. Spinal stenosis with mild cord flattening. Uncinate hypertrophy and facet degenerative changes. Moderate bilateral foraminal narrowing. C6-7 facet degenerative changes greater on the left. Mild anterior slip C6. Disc osteophyte complex greater on left. Flattening of the cord greater on left. Facet degenerative changes and uncinate hypertrophy with moderate right-sided and marked left-sided foraminal narrowing. These results were called by telephone at the time of interpretation on 09/07/2015 at 7:56 am to Dr. Cindie Laroche who verbally acknowledged these results. Electronically Signed   By: Genia Del M.D.   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 Staph aureus septicemia on treatment. Bilateral leg weakness. Renal failure. Anemia. Subsequent encounter. EXAM: MRI THORACIC SPINE WITHOUT  CONTRAST MRI CERVICAL SPINE WITHOUT CONTRAST TECHNIQUE: Multiplanar, multisequence MR imaging of the cervical spine  was performed. No intravenous contrast was administered. Multiplanar, multisequence MR imaging of the thoracic spine was performed. No intravenous contrast was administered. COMPARISON:  09/05/2015 lumbar spine MR. 09/01/2015 CT abdomen and pelvis. 12/04/2014 cervical spine CT. FINDINGS: MR CERVICAL SPINE Exam is motion degraded. Diffuse decreased signal intensity of bone marrow may be related to patient's anemia. Fracture of the anterior inferior aspect of the C2 vertebral body of indeterminate age. Mild edema surrounding the fracture site suggesting that fracture has not completely healed radiographically. Minimal incongruent at this level on the 12/04/2014 exam and therefore fracture may be chronic although not completely healed. No adjacent prevertebral edema/hematoma as expected with an acute fracture. Nonspecific edema within paraspinal musculature C3 through C5 level on the right. This may be reflective result of acute soft tissue injury or myositis. Given the below described findings, infection not excluded although felt to be less likely consideration. Intracranial atrophy. Cervical medullary junction unremarkable. No focal cervical cord signal abnormality. C2-3: Minimal narrowing ventral thecal sac. Minimal foraminal narrowing. C3-4: Facet degenerative changes. Minimal anterior slip C3. Bulge. Narrowing ventral thecal sac. Mild bilateral foraminal narrowing. C4-5: Bulge/broad-based protrusion. Buckling posterior ligaments. Moderate spinal stenosis with circumferential moderate cord flattening. Uncinate hypertrophy and facet degenerative changes. Moderate bilateral foraminal narrowing. C5-6: Broad-based disc osteophyte complex. Spinal stenosis with mild cord flattening. Uncinate hypertrophy and facet degenerative changes. Moderate bilateral foraminal narrowing. C6-7: Facet degenerative changes greater on the left. Mild anterior slip C6. Disc osteophyte complex greater on left. Flattening of the cord greater  on left. Facet degenerative changes and uncinate hypertrophy with moderate right-sided and marked left-sided foraminal narrowing. C7-T1: Remote Schmorl's node deformity T1. Facet degenerative changes. No significant spinal stenosis. Minimal foraminal narrowing. MR THORACIC SPINE Patient was not able to complete second side of axial imaging. Exam is motion degraded. Baseline decreased signal intensity of bone marrow which may be related to patient's underlying anemia. Acute T8 compression fracture with 60% loss of height. Mild retropulsion posterior inferior aspect of the compressed vertebra. Edema extends into posterior elements. Surrounding soft tissue/fluid collection. Of note the is an epidural collection which extends from the upper T9 level to the mid T5 level. This is greatest in a right posterior lateral position at the T5 and T6 level, greatest right lateral position at the T8-9 level and greatest in a ventral right paracentral position at the upper T9 level. This causes mass effect upon the adjacent cord which is displaced anteriorly and to left at the T5 and T6 level. Suggestion of increased signal within the compress cord which may represent edema. Etiology of the epidural process is indeterminate. In alcoholic patient, compression fracture of T8 may be related to recent fall with epidural component related to blood however patient has Staph Aures septicemia and therefore epidural abscess is also consideration. T8-9 bilateral foraminal narrowing. Retropulsion compressed T8 vertebral body contributes to spinal stenosis and cord flattening. Remote Schmorl's node deformity superior endplate T1. Remote anterior wedge compression deformity involving superior endplate T12 with 62% loss of height anteriorly and mild retropulsion posterior superior aspect with narrowing of the ventral aspect of thecal sac and mild flattening of the ventral cord which is slightly posteriorly displaced. Remote L1 superior endplate  compression fracture with 20% loss of height. IMPRESSION: MR THORACIC SPINE Patient was not able to complete second side of axial imaging. Exam is motion degraded. Baseline decreased signal intensity of bone marrow which  may be related to patient's underlying anemia. Acute T8 compression fracture with 60% loss of height. Mild retropulsion posterior inferior aspect of the compressed vertebra. Edema extends into posterior elements. Surrounding soft tissue/fluid collection. Spinal stenosis with mild flattening of the adjacent cord. Bilateral T8-9 foraminal narrowing. Epidural collection extends from the upper T9 level to the mid T5 level. This is greatest in a right posterior lateral position at the T5 and T6 level, greatest right lateral position at the T8-9 level and greatest in a ventral right paracentral position at the upper T9 level. This causes mass effect upon the adjacent cord which is displaced anteriorly and to left at the T5 and T6 level. Suggestion of increased signal within the compress cord which may represent edema. Etiology of the epidural process is indeterminate. In alcoholic patient, compression fracture of T8 may be related to recent fall with epidural component related to blood however patient has Staph Aures septicemia and therefore epidural abscess is also consideration. Remote Schmorl's node deformity superior endplate T1. Remote anterior wedge compression deformity involving superior endplate T12 with 03% loss of height anteriorly and mild retropulsion posterior superior aspect with narrowing of the ventral aspect of thecal sac and mild flattening of the ventral cord which is slightly posteriorly displaced. Remote L1 superior endplate compression fracture with 20% loss of height. MR CERVICAL SPINE Exam is motion degraded. Diffuse decreased signal intensity of bone marrow may be related to patient's anemia. Fracture of the anterior inferior aspect of the C2 vertebral body of indeterminate age.  Mild edema surrounding the fracture site suggesting that fracture has not completely healed radiographically. Minimal incongruent at this level on the 12/04/2014 exam and therefore fracture may be chronic although not completely healed. No adjacent prevertebral edema/hematoma as expected with an acute fracture. Nonspecific edema within paraspinal musculature C3 through C5 level on the right. This may be reflective result of acute soft tissue injury or myositis. Given the above described findings, infection not excluded although felt to be less likely consideration. C3-4 bulge. Narrowing ventral thecal sac. Mild bilateral foraminal narrowing. C4-5 multifactorial moderate spinal stenosis with circumferential moderate cord flattening. Moderate bilateral foraminal narrowing. C5-6 broad-based disc osteophyte complex. Spinal stenosis with mild cord flattening. Uncinate hypertrophy and facet degenerative changes. Moderate bilateral foraminal narrowing. C6-7 facet degenerative changes greater on the left. Mild anterior slip C6. Disc osteophyte complex greater on left. Flattening of the cord greater on left. Facet degenerative changes and uncinate hypertrophy with moderate right-sided and marked left-sided foraminal narrowing. These results were called by telephone at the time of interpretation on 09/07/2015 at 7:56 am to Dr. Janna Arch who verbally acknowledged these results. Electronically Signed   By: Lacy Duverney M.D.   On: 09/07/2015 08:26   Mr Lumbar Spine Wo Contrast  09/06/2015  CLINICAL DATA:  New onset RIGHT leg weakness today, assess for mass or infarct. History of COPD, hypertension, cirrhosis, alcohol and substance abuse. EXAM: MRI LUMBAR SPINE WITHOUT CONTRAST TECHNIQUE: Multiplanar, multisequence MR imaging of the lumbar spine was performed. No intravenous contrast was administered. No contrast administered due to GFR of 34. COMPARISON:  CT abdomen and pelvis August 31, 2005 FINDINGS: Sagittal sequences are  mildly motion degraded, axial sequences are moderately motion degraded. Using the reference level of the last well-formed intervertebral disc as L5-S1, moderate chronic T12 burst fracture, mild chronic L1 compression fracture. Moderate to severe L3 central height loss, with L3 superior and inferior endplate large Schmorl's nodes. Mild to moderate L5 superior endplate compression fracture with  Schmorl's node. Additional chronic Schmorl's nodes. Mild L5-S1 disc height loss, with slight desiccation the lower lumbar discs. Old the level mild subacute to chronic discogenic endplate changes without convincing STIR signal abnormality to suggest acute osseous process. Subcentimeter S2 probable atypical hemangioma. Conus medullaris terminates at L1. Limited assessment of cauda equina due to motion. Mild epidural lipomatosis. Moderate symmetric paraspinal muscle T2 bright signal without atrophy. T11-12: Small amount of retropulsed bony fragments. Mild canal stenosis without neural foraminal narrowing. T12-L1: No significant disc bulge. Mild facet arthropathy and ligamentum flavum redundancy without canal stenosis or neural foraminal narrowing. L1-2: No significant disc bulge. Mild facet arthropathy and ligamentum flavum redundancy without canal stenosis or neural foraminal narrowing. L2-3: Small broad-based disc bulge, mild facet arthropathy and ligamentum flavum redundancy. Mild canal stenosis and partial effacement of thecal sac due to epidural fat. No neural foraminal narrowing. L3-4: Small broad-based disc osteophyte complex, mild facet arthropathy and ligamentum flavum redundancy. Mild canal stenosis and partial effacement of the thecal sac due to epidural fat. Moderate RIGHT, mild LEFT neural foraminal narrowing. L4-5: Small broad-based disc bulge. Moderate facet arthropathy and ligamentum flavum redundancy. No canal stenosis, partial effacement thecal sac due to epidural fat. Mild to moderate LEFT neural foraminal  narrowing. L5-S1: Small broad-based disc bulge. Moderate facet arthropathy and ligamentum flavum redundancy without canal stenosis. Moderate RIGHT neural foraminal narrowing. IMPRESSION: Motion degraded examination. Degenerative change of the lumbar spine, superimposed on of epidural lipomatosis without definite epidural abscess on noncontrast examination. Multiple old fractures including moderate to severe L3 compression fracture. Mild canal stenosis L2-3 and L3-4. Neural foraminal narrowing L3-4 through L5-S1: Moderate on the RIGHT at L3-4 and L5-S1. Electronically Signed   By: Elon Alas M.D.   On: 09/06/2015 01:11   US Venous Img Lower Unilateral Right  08/27/2015  CLINICAL DATA:  Right lower extremity swelling. Right knee pain. Status post fall 3 weeks ago. EXAM: RIGHT LOWER EXTREMITY VENOUS DOPPLER ULTRASOUND TECHNIQUE: Gray-scale sonography with graded compression, as well as color Doppler and duplex ultrasound were performed to evaluate the lower extremity deep venous systems from the level of the common femoral vein and including the common femoral, femoral, profunda femoral, popliteal and calf veins including the posterior tibial, peroneal and gastrocnemius veins when visible. The superficial great saphenous vein was also interrogated. Spectral Doppler was utilized to evaluate flow at rest and with distal augmentation maneuvers in the common femoral, femoral and popliteal veins. COMPARISON:  None. FINDINGS: Contralateral Common Femoral Vein: Respiratory phasicity is normal and symmetric with the symptomatic side. No evidence of thrombus. Normal compressibility. Common Femoral Vein: No evidence of thrombus. Normal compressibility, respiratory phasicity and response to augmentation. Saphenofemoral Junction: No evidence of thrombus. Normal compressibility and flow on color Doppler imaging. Profunda Femoral Vein: No evidence of thrombus. Normal compressibility and flow on color Doppler imaging.  Femoral Vein: No evidence of thrombus. Normal compressibility, respiratory phasicity and response to augmentation. Popliteal Vein: No evidence of thrombus. Normal compressibility, respiratory phasicity and response to augmentation. Calf Veins: No evidence of thrombus. Normal compressibility and flow on color Doppler imaging. Superficial Great Saphenous Vein: No evidence of thrombus. Normal compressibility and flow on color Doppler imaging. Venous Reflux:  None. Other Findings:  None. IMPRESSION: No evidence of deep venous thrombosis. Electronically Signed   By: Kathreen Devoid   On: 08/27/2015 09:53   Dg Chest Port 1 View  08/28/2015  CLINICAL DATA:  Fever. Hypertension, COPD, substance abuse. Alcohol dependence. EXAM: PORTABLE CHEST 1 VIEW COMPARISON:  08/27/2015 FINDINGS: Platelike atelectasis in the lingula. Airspace disease in the right lung base and patchy opacity in the right upper lobe concerning for pneumonia. Heart is borderline in size. No effusions. No acute bony abnormality. IMPRESSION: Patchy right lower lobe and right upper lobe airspace disease concerning for pneumonia. Lingular atelectasis. Borderline heart size. Electronically Signed   By: Rolm Baptise M.D.   On: 08/28/2015 18:05   Dg Knee Complete 4 Views Right  08/27/2015  CLINICAL DATA:  Recent fall with a right knee injury. Pain. Initial encounter. EXAM: RIGHT KNEE - COMPLETE 4+ VIEW COMPARISON:  None. FINDINGS: No acute bony or joint abnormality is identified. A loose body projects over the posterior aspect of the lateral compartment and measures 1.2 cm in diameter. Small joint effusion is noted. Atherosclerosis is identified. IMPRESSION: No acute abnormality. Loose body posterior aspect of the lateral compartment. Small joint effusion. Electronically Signed   By: Inge Rise M.D.   On: 08/27/2015 14:00   Dg Abd 2 Views  08/27/2015  CLINICAL DATA:  Severe abdominal pain.  History of cirrhosis. EXAM: ABDOMEN - 2 VIEW COMPARISON:   Abdominal ultrasound 07/22/2015 FINDINGS: The bowel gas pattern is normal. There is no evidence of free air. No evidence of organomegaly radiographically. No radio-opaque calculi or other significant radiographic abnormality is seen. IMPRESSION: Negative. Electronically Signed   By: Fidela Salisbury M.D.   On: 08/27/2015 10:50    Microbiology: Recent Results (from the past 240 hour(s))  MRSA PCR Screening     Status: Abnormal   Collection Time: 09/07/15  6:56 PM  Result Value Ref Range Status   MRSA by PCR POSITIVE (A) NEGATIVE Final    Comment:        The GeneXpert MRSA Assay (FDA approved for NASAL specimens only), is one component of a comprehensive MRSA colonization surveillance program. It is not intended to diagnose MRSA infection nor to guide or monitor treatment for MRSA infections. RESULT CALLED TO, READ BACK BY AND VERIFIED WITH: M MASON'@0006'$  09/06/15 MKELLY   Anaerobic culture     Status: None   Collection Time: 09/07/15  8:39 PM  Result Value Ref Range Status   Specimen Description ABSCESS  Final   Special Requests EPIDURAL ABSCESS  Final   Gram Stain   Final    NO WBC SEEN NO SQUAMOUS EPITHELIAL CELLS SEEN NO ORGANISMS SEEN Performed at [facility]    Culture   Final    NO ANAEROBES ISOLATED Performed at [facility]    Report Status 09/12/2015 FINAL  Final  Culture, routine-abscess     Status: None   Collection Time: 09/07/15  8:39 PM  Result Value Ref Range Status   Specimen Description ABSCESS  Final   Special Requests EPIDURAL ABSCESS  Final   Gram Stain   Final    NO WBC SEEN NO SQUAMOUS EPITHELIAL CELLS SEEN NO ORGANISMS SEEN Performed at [facility]    Culture   Final    NO GROWTH 2 DAYS Performed at [facility]    Report Status 09/10/2015 FINAL  Final  Anaerobic culture     Status: None   Collection Time: 09/07/15  9:20 PM  Result Value Ref Range Status   Specimen Description ABSCESS  Final   Special  Requests THORACIC EPIDURAL  Final   Gram Stain   Final    FEW WBC PRESENT, PREDOMINANTLY PMN NO SQUAMOUS EPITHELIAL CELLS SEEN NO ORGANISMS SEEN Performed at [facility]  Culture   Final    NO ANAEROBES ISOLATED Performed at [facility]    Report Status 09/12/2015 FINAL  Final  Culture, routine-abscess     Status: None   Collection Time: 09/07/15  9:20 PM  Result Value Ref Range Status   Specimen Description ABSCESS  Final   Special Requests THORACIC EPIDURAL  Final   Gram Stain   Final    FEW WBC PRESENT, PREDOMINANTLY PMN NO SQUAMOUS EPITHELIAL CELLS SEEN NO ORGANISMS SEEN Performed at [facility]    Culture   Final    RARE METHICILLIN RESISTANT STAPHYLOCOCCUS AUREUS Note: RIFAMPIN AND GENTAMICIN SHOULD NOT BE USED AS SINGLE DRUGS FOR TREATMENT OF STAPH INFECTIONS. CRITICAL RESULT CALLED TO, READ BACK BY AND VERIFIED WITH: FAITH H. AT 9:38AM ON 09/11/2015 HAJAM Performed at [facility]    Report Status 09/11/2015 FINAL  Final   Organism ID, Bacteria METHICILLIN RESISTANT STAPHYLOCOCCUS AUREUS  Final      Susceptibility   Methicillin resistant staphylococcus aureus - MIC*    CLINDAMYCIN <=0.25 SENSITIVE Sensitive     ERYTHROMYCIN <=0.25 SENSITIVE Sensitive     GENTAMICIN <=0.5 SENSITIVE Sensitive     LEVOFLOXACIN >=8 RESISTANT Resistant     OXACILLIN >=4 RESISTANT Resistant     RIFAMPIN <=0.5 SENSITIVE Sensitive     TRIMETH/SULFA >=320 RESISTANT Resistant     VANCOMYCIN 1 SENSITIVE Sensitive     TETRACYCLINE <=1 SENSITIVE Sensitive     * RARE METHICILLIN RESISTANT STAPHYLOCOCCUS AUREUS     Labs: Basic Metabolic Panel:  Recent Labs Lab 09/09/15 0515 09/10/15 0325 09/11/15 0525 09/12/15 0212 09/13/15 0447  NA 133* 135 134* 134* 133*  K 4.9 4.9 4.5 4.8 4.3  CL 106 108 108 106 108  CO2 20* '24 22 22 '$ 21*  GLUCOSE 137* 117* 101* 129* 102*  BUN 43* 37* 34* 32* 30*  CREATININE 1.53* 1.44* 1.30* 1.43* 1.43*  CALCIUM 7.4* 7.8*  7.5* 7.8* 7.5*   Liver Function Tests: No results for input(s): AST, ALT, ALKPHOS, BILITOT, PROT, ALBUMIN in the last 168 hours. No results for input(s): LIPASE, AMYLASE in the last 168 hours. No results for input(s): AMMONIA in the last 168 hours. CBC:  Recent Labs Lab 09/07/15 2140 09/08/15 0340 09/10/15 0325 09/11/15 0525 09/12/15 0213  WBC  --  11.5* 12.5* 8.3 11.0*  HGB 9.5* 7.4* 7.7* 7.0* 7.9*  HCT 28.0* 22.3* 24.1* 21.3* 24.0*  MCV  --  94.1 98.0 100.0 97.2  PLT  --  222 240 187 195   Cardiac Enzymes: No results for input(s): CKTOTAL, CKMB, CKMBINDEX, TROPONINI in the last 168 hours. BNP: BNP (last 3 results)  Recent Labs  09/12/15 1345  BNP 57.1    ProBNP (last 3 results) No results for input(s): PROBNP in the last 8760 hours.  CBG: No results for input(s): GLUCAP in the last 168 hours.     Signed:  Annita Brod  [facility] 09/13/2015, 9:15 AM

## 2015-09-13 NOTE — Progress Notes (Signed)
Initial Nutrition Assessment  DOCUMENTATION CODES:   Non-severe (moderate) malnutrition in context of acute illness/injury, Obesity unspecified  INTERVENTION:  Provide Ensure Enlive po BID until PO intake improves, each supplement provides 350 kcal and 20 grams of protein Provide Multivitamin with minerals daily   NUTRITION DIAGNOSIS:   Inadequate oral intake related to altered GI function as evidenced by per patient/family report, energy intake < 75% for > 7 days.   GOAL:   Patient will meet greater than or equal to 90% of their needs   MONITOR:   PO intake, Supplement acceptance, Labs, Weight trends, Skin, I & O's  REASON FOR ASSESSMENT:   Consult Assessment of nutrition requirement/status  ASSESSMENT:   70 year old male with past medical history of alcohol abuse and secondary cirrhosis initially admitted on 1/27 with alcohol withdrawal and swollen right knee concerning for septic joint. Seen by orthopedic surgery and knee aspirated, right knee cultures grew out MRSA as did blood cultures and patient initially started on IV antibiotics.during this time, patient was also treated for alcohol withdrawal. 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 Upon workup, patient underwent MRI of the thoracic spine and he was found to have an epidural abscess.   Pt reports eating 50-75% of his meals since admission (> 16 days) due to early satiety, reflux, and abdominal bloating with pain. He reports eating a good amount PTA, but states he was not eating healthfully as he got most of his food from a convenient store. He states that he has not lost weight due to edema; he usually weighs 230 lbs or less. RD emphasized the importance of adequate healthful PO intake. Encouraged intake of protein and vegetables at meals. Pt states he likes Ensure and will drink it until PO intake improves. Pt has moderate muscle wasting and mild fat wasting per  nutrition focused physical exam.  Labs: low sodium, low calcium, low hemoglobin  Diet Order:  Diet Heart Room service appropriate?: Yes; Fluid consistency:: Thin Diet - low sodium heart healthy  Skin:  Wound (see comment) (closed incision on back)  Last BM:  2/12  Height:   Ht Readings from Last 1 Encounters:  09/09/15  (1.854 m)    Weight:   Wt Readings from Last 1 Encounters:  09/13/15 257 lb 14.4 oz (116.983 kg)    Ideal Body Weight:  83.6 kg  BMI:  Body mass index is 34.03 kg/(m^2).  Estimated Nutritional Needs:   Kcal:  2000-2200  Protein:  110-120 grams  Fluid:  >/= 2.2 L/day  EDUCATION NEEDS:   No education needs identified at this time  Dorothea Ogle RD, LDN Inpatient Clinical Dietitian Pager: [phone] After Hours Pager: [phone]

## 2015-09-13 NOTE — Progress Notes (Signed)
No acute events AVSS Able to extend at knees now, wiggling toes a little bit better now Incision c/d/i Stable and improving

## 2015-09-13 NOTE — Care Management Note (Signed)
Case Management Note  Patient Details  Name: Roberto [name] MRN: [id number] Date of Birth: 02/12/1946  Subjective/Objective:                    Action/Plan: Patient discharging to [facility] today. No further needs per CM.  Expected Discharge Date:                  Expected Discharge Plan:  Skilled Nursing Facility  In-House Referral:  Clinical Social Work  Discharge planning Services  CM Consult  Post Acute Care Choice:    Choice offered to:     DME Arranged:    DME Agency:     HH Arranged:    HH Agency:     Status of Service:  In process, will continue to follow  Medicare Important Message Given:  Yes Date Medicare IM Given:    Medicare IM give by:    Date Additional Medicare IM Given:    Additional Medicare Important Message give by:     If discussed at Long Length of Stay Meetings, dates discussed:    Additional Comments:  Kermit Balo, RN 09/13/2015, 3:41 PM

## 2015-09-13 NOTE — Progress Notes (Signed)
Pt discharged to SNF [facility]) pt transported via EMS [facility], no obvious distress upon leaving unit, report called to pamela at [facility] 1930 hrs, Pt left via stretcher with EMS without incident. TLSO brace with EMS

## 2015-09-13 NOTE — Clinical Social Work Note (Signed)
Clinical Social Worker facilitated patient discharge including contacting patient family and facility to confirm patient discharge plans.  Clinical information faxed to facility and family agreeable with plan.  CSW arranged ambulance transport via [facility] to [facility] [place].  RN to call report prior to discharge.  Clinical Social Worker will sign off for now as social work intervention is no longer needed. Please consult [place] again if new need arises.  Derenda Fennel, MSW, LCSWA [phone] 09/13/2015 11:45 AM

## 2015-09-13 NOTE — Progress Notes (Signed)
Pharmacy Antibiotic Note Roberto [name] is a 70 y.o. male admitted on 08/27/2015 with knee pain from injury from binge drinking. Currently on Vancomycin 750 mg Q 12 hours for MRSA bacteremia with MRI w/ thoracic epidural abscess.   Plan: 1. VT of 23 is slightly above desired range of ~ 20 based on indication; however for now will continue with current dosing as prefer to be at upper end of range for treatment of abscess  2. SCr Q 72H while inpatient 3. Weekly VT unless clinical course dictates otherwise 4. Following along with you daily  Height:  (185.4 cm) Weight: 257 lb 14.4 oz (116.983 kg) IBW/kg (Calculated) : 79.9  Temp (24hrs), Avg:98.5 F (36.9 C), Min:98 F (36.7 C), Max:99.1 F (37.3 C)   Recent Labs Lab 09/08/15 0340 09/09/15 0515 09/10/15 0325 09/11/15 0525 09/11/15 1027 09/12/15 0212 09/12/15 0213 09/13/15 0447 09/13/15 1215  WBC 11.5*  --  12.5* 8.3  --   --  11.0*  --   --   CREATININE 1.69* 1.53* 1.44* 1.30*  --  1.43*  --  1.43*  --   VANCOTROUGH  --   --   --   --  15  --   --   --  23*     No Known Allergies  Antimicrobials this admission: vanc 1/29 >> 2/2,  2/8 >> Zosyn 1/29 >> 2/1 ceftaroline 2/2>> 2/8  Dose adjustments this admission: 2/1 VT 26 on 1 gm q12, dose changed to 1250 q24 2/13 VT 23 on 750 mg Q12H SCr 1.43  Microbiology results: 1/28 blood cx MRSA 2/1 blood cx x 2 NGTD 2/7 tissue cx pending 2/7 abscess >> MRSA  Thank you for allowing pharmacy to be a part of this patient's care.  Pollyann Samples, PharmD, BCPS 09/13/2015, 1:09 PM Pager: [phone]

## 2015-09-14 DIAGNOSIS — A4902 Methicillin resistant Staphylococcus aureus infection, unspecified site: Secondary | ICD-10-CM | POA: Insufficient documentation

## 2015-09-14 DIAGNOSIS — R188 Other ascites: Secondary | ICD-10-CM

## 2015-09-14 DIAGNOSIS — K7460 Unspecified cirrhosis of liver: Secondary | ICD-10-CM | POA: Insufficient documentation

## 2015-09-15 ENCOUNTER — Inpatient Hospital Stay (HOSPITAL_COMMUNITY)
Admission: EM | Admit: 2015-09-15 | Discharge: 2015-09-30 | DRG: 388 | Disposition: A | Discharge status: Skilled Nursing Facility | Payer: Medicare Other | Attending: Family Medicine | Admitting: Family Medicine

## 2015-09-15 ENCOUNTER — Emergency Department (HOSPITAL_COMMUNITY): Payer: Medicare Other

## 2015-09-15 ENCOUNTER — Encounter (HOSPITAL_COMMUNITY): Payer: Self-pay | Admitting: Emergency Medicine

## 2015-09-15 DIAGNOSIS — K567 Ileus, unspecified: Secondary | ICD-10-CM | POA: Diagnosis present

## 2015-09-15 DIAGNOSIS — D689 Coagulation defect, unspecified: Secondary | ICD-10-CM | POA: Diagnosis present

## 2015-09-15 DIAGNOSIS — R32 Unspecified urinary incontinence: Secondary | ICD-10-CM | POA: Diagnosis present

## 2015-09-15 DIAGNOSIS — F1010 Alcohol abuse, uncomplicated: Secondary | ICD-10-CM | POA: Diagnosis present

## 2015-09-15 DIAGNOSIS — Z87440 Personal history of urinary (tract) infections: Secondary | ICD-10-CM | POA: Diagnosis not present

## 2015-09-15 DIAGNOSIS — T502X5A Adverse effect of carbonic-anhydrase inhibitors, benzothiadiazides and other diuretics, initial encounter: Secondary | ICD-10-CM | POA: Diagnosis present

## 2015-09-15 DIAGNOSIS — K760 Fatty (change of) liver, not elsewhere classified: Secondary | ICD-10-CM | POA: Diagnosis present

## 2015-09-15 DIAGNOSIS — F1911 Other psychoactive substance abuse, in remission: Secondary | ICD-10-CM

## 2015-09-15 DIAGNOSIS — Z8711 Personal history of peptic ulcer disease: Secondary | ICD-10-CM | POA: Diagnosis not present

## 2015-09-15 DIAGNOSIS — K7031 Alcoholic cirrhosis of liver with ascites: Secondary | ICD-10-CM

## 2015-09-15 DIAGNOSIS — F10230 Alcohol dependence with withdrawal, uncomplicated: Secondary | ICD-10-CM

## 2015-09-15 DIAGNOSIS — N179 Acute kidney failure, unspecified: Secondary | ICD-10-CM | POA: Diagnosis present

## 2015-09-15 DIAGNOSIS — Z955 Presence of coronary angioplasty implant and graft: Secondary | ICD-10-CM

## 2015-09-15 DIAGNOSIS — Z8614 Personal history of Methicillin resistant Staphylococcus aureus infection: Secondary | ICD-10-CM

## 2015-09-15 DIAGNOSIS — E43 Unspecified severe protein-calorie malnutrition: Secondary | ICD-10-CM

## 2015-09-15 DIAGNOSIS — D649 Anemia, unspecified: Secondary | ICD-10-CM | POA: Diagnosis present

## 2015-09-15 DIAGNOSIS — E778 Other disorders of glycoprotein metabolism: Secondary | ICD-10-CM | POA: Diagnosis present

## 2015-09-15 DIAGNOSIS — R339 Retention of urine, unspecified: Secondary | ICD-10-CM | POA: Diagnosis present

## 2015-09-15 DIAGNOSIS — Z515 Encounter for palliative care: Secondary | ICD-10-CM

## 2015-09-15 DIAGNOSIS — J410 Simple chronic bronchitis: Secondary | ICD-10-CM

## 2015-09-15 DIAGNOSIS — K767 Hepatorenal syndrome: Secondary | ICD-10-CM | POA: Diagnosis present

## 2015-09-15 DIAGNOSIS — Y838 Other surgical procedures as the cause of abnormal reaction of the patient, or of later complication, without mention of misadventure at the time of the procedure: Secondary | ICD-10-CM | POA: Diagnosis present

## 2015-09-15 DIAGNOSIS — E8809 Other disorders of plasma-protein metabolism, not elsewhere classified: Secondary | ICD-10-CM | POA: Diagnosis present

## 2015-09-15 DIAGNOSIS — D696 Thrombocytopenia, unspecified: Secondary | ICD-10-CM | POA: Diagnosis present

## 2015-09-15 DIAGNOSIS — G8220 Paraplegia, unspecified: Secondary | ICD-10-CM | POA: Diagnosis present

## 2015-09-15 DIAGNOSIS — R109 Unspecified abdominal pain: Secondary | ICD-10-CM | POA: Diagnosis not present

## 2015-09-15 DIAGNOSIS — K7290 Hepatic failure, unspecified without coma: Secondary | ICD-10-CM | POA: Diagnosis present

## 2015-09-15 DIAGNOSIS — G062 Extradural and subdural abscess, unspecified: Secondary | ICD-10-CM | POA: Diagnosis present

## 2015-09-15 DIAGNOSIS — R1084 Generalized abdominal pain: Secondary | ICD-10-CM | POA: Diagnosis not present

## 2015-09-15 DIAGNOSIS — Z66 Do not resuscitate: Secondary | ICD-10-CM | POA: Diagnosis present

## 2015-09-15 DIAGNOSIS — Z7189 Other specified counseling: Secondary | ICD-10-CM | POA: Insufficient documentation

## 2015-09-15 DIAGNOSIS — R5381 Other malaise: Secondary | ICD-10-CM | POA: Diagnosis not present

## 2015-09-15 DIAGNOSIS — J449 Chronic obstructive pulmonary disease, unspecified: Secondary | ICD-10-CM | POA: Diagnosis present

## 2015-09-15 DIAGNOSIS — Z452 Encounter for adjustment and management of vascular access device: Secondary | ICD-10-CM

## 2015-09-15 DIAGNOSIS — K560 Paralytic ileus: Secondary | ICD-10-CM | POA: Diagnosis present

## 2015-09-15 DIAGNOSIS — E871 Hypo-osmolality and hyponatremia: Secondary | ICD-10-CM | POA: Diagnosis present

## 2015-09-15 DIAGNOSIS — K7460 Unspecified cirrhosis of liver: Secondary | ICD-10-CM | POA: Diagnosis present

## 2015-09-15 DIAGNOSIS — N5089 Other specified disorders of the male genital organs: Secondary | ICD-10-CM | POA: Diagnosis present

## 2015-09-15 DIAGNOSIS — K598 Other specified functional intestinal disorders: Secondary | ICD-10-CM | POA: Diagnosis not present

## 2015-09-15 DIAGNOSIS — K219 Gastro-esophageal reflux disease without esophagitis: Secondary | ICD-10-CM | POA: Diagnosis present

## 2015-09-15 DIAGNOSIS — F329 Major depressive disorder, single episode, unspecified: Secondary | ICD-10-CM | POA: Diagnosis present

## 2015-09-15 DIAGNOSIS — F17210 Nicotine dependence, cigarettes, uncomplicated: Secondary | ICD-10-CM | POA: Diagnosis present

## 2015-09-15 DIAGNOSIS — E876 Hypokalemia: Secondary | ICD-10-CM | POA: Diagnosis present

## 2015-09-15 DIAGNOSIS — A4902 Methicillin resistant Staphylococcus aureus infection, unspecified site: Secondary | ICD-10-CM | POA: Diagnosis not present

## 2015-09-15 DIAGNOSIS — I110 Hypertensive heart disease with heart failure: Secondary | ICD-10-CM | POA: Diagnosis present

## 2015-09-15 DIAGNOSIS — R112 Nausea with vomiting, unspecified: Secondary | ICD-10-CM | POA: Diagnosis present

## 2015-09-15 DIAGNOSIS — I5032 Chronic diastolic (congestive) heart failure: Secondary | ICD-10-CM | POA: Diagnosis present

## 2015-09-15 DIAGNOSIS — R188 Other ascites: Secondary | ICD-10-CM

## 2015-09-15 DIAGNOSIS — I10 Essential (primary) hypertension: Secondary | ICD-10-CM | POA: Diagnosis present

## 2015-09-15 DIAGNOSIS — R140 Abdominal distension (gaseous): Secondary | ICD-10-CM | POA: Diagnosis not present

## 2015-09-15 DIAGNOSIS — N289 Disorder of kidney and ureter, unspecified: Secondary | ICD-10-CM

## 2015-09-15 LAB — CBC WITH DIFFERENTIAL/PLATELET
BASOS ABS: 0.1 10*3/uL (ref 0.0–0.1)
Basophils Relative: 1 %
EOS PCT: 3 %
Eosinophils Absolute: 0.3 10*3/uL (ref 0.0–0.7)
HEMATOCRIT: 24.9 % — AB (ref 39.0–52.0)
Hemoglobin: 8.5 g/dL — ABNORMAL LOW (ref 13.0–17.0)
LYMPHS ABS: 1.8 10*3/uL (ref 0.7–4.0)
LYMPHS PCT: 18 %
MCH: 33.7 pg (ref 26.0–34.0)
MCHC: 34.1 g/dL (ref 30.0–36.0)
MCV: 98.8 fL (ref 78.0–100.0)
MONO ABS: 1.4 10*3/uL — AB (ref 0.1–1.0)
Monocytes Relative: 14 %
NEUTROS ABS: 6.6 10*3/uL (ref 1.7–7.7)
Neutrophils Relative %: 64 %
PLATELETS: 171 10*3/uL (ref 150–400)
RBC: 2.52 MIL/uL — AB (ref 4.22–5.81)
RDW: 22.4 % — ABNORMAL HIGH (ref 11.5–15.5)
WBC: 10.2 10*3/uL (ref 4.0–10.5)

## 2015-09-15 LAB — COMPREHENSIVE METABOLIC PANEL
ALT: 33 U/L (ref 17–63)
AST: 76 U/L — AB (ref 15–41)
Albumin: 1.6 g/dL — ABNORMAL LOW (ref 3.5–5.0)
Alkaline Phosphatase: 88 U/L (ref 38–126)
Anion gap: 3 — ABNORMAL LOW (ref 5–15)
BILIRUBIN TOTAL: 1.5 mg/dL — AB (ref 0.3–1.2)
BUN: 36 mg/dL — AB (ref 6–20)
CALCIUM: 7.4 mg/dL — AB (ref 8.9–10.3)
CHLORIDE: 105 mmol/L (ref 101–111)
CO2: 22 mmol/L (ref 22–32)
CREATININE: 1.45 mg/dL — AB (ref 0.61–1.24)
GFR, EST AFRICAN AMERICAN: 56 mL/min — AB (ref >60)
GFR, EST NON AFRICAN AMERICAN: 48 mL/min — AB (ref >60)
Glucose, Bld: 105 mg/dL — ABNORMAL HIGH (ref 65–99)
Potassium: 4.3 mmol/L (ref 3.5–5.1)
Sodium: 130 mmol/L — ABNORMAL LOW (ref 135–145)
TOTAL PROTEIN: 6.2 g/dL — AB (ref 6.5–8.1)

## 2015-09-15 LAB — URINE MICROSCOPIC-ADD ON

## 2015-09-15 LAB — TSH: TSH: 4.684 u[IU]/mL — AB (ref 0.350–4.500)

## 2015-09-15 LAB — URINALYSIS, ROUTINE W REFLEX MICROSCOPIC
BILIRUBIN URINE: NEGATIVE
GLUCOSE, UA: NEGATIVE mg/dL
KETONES UR: NEGATIVE mg/dL
Nitrite: NEGATIVE
PH: 5.5 (ref 5.0–8.0)
PROTEIN: NEGATIVE mg/dL
Specific Gravity, Urine: 1.015 (ref 1.005–1.030)

## 2015-09-15 LAB — LIPASE, BLOOD: LIPASE: 64 U/L — AB (ref 11–51)

## 2015-09-15 LAB — LACTIC ACID, PLASMA: LACTIC ACID, VENOUS: 1 mmol/L (ref 0.5–2.0)

## 2015-09-15 MED ORDER — ALBUTEROL SULFATE (2.5 MG/3ML) 0.083% IN NEBU
2.5000 mg | INHALATION_SOLUTION | Freq: Four times a day (QID) | RESPIRATORY_TRACT | Status: DC | PRN
  Administered 2015-09-21 – 2015-09-30 (×4): 2.5 mg via RESPIRATORY_TRACT
  Filled 2015-09-15 (×6): qty 3

## 2015-09-15 MED ORDER — SODIUM CHLORIDE 0.9% FLUSH
INTRAVENOUS | Status: AC
  Filled 2015-09-15: qty 200

## 2015-09-15 MED ORDER — CIPROFLOXACIN IN D5W 400 MG/200ML IV SOLN
400.0000 mg | Freq: Once | INTRAVENOUS | Status: AC
  Administered 2015-09-15: 400 mg via INTRAVENOUS
  Filled 2015-09-15: qty 200

## 2015-09-15 MED ORDER — BISACODYL 10 MG RE SUPP: 10.0000 mg | Freq: Every day | RECTAL | Status: DC | PRN

## 2015-09-15 MED ORDER — POLYVINYL ALCOHOL 1.4 % OP SOLN: 1.0000 [drp] | OPHTHALMIC | Status: DC | PRN

## 2015-09-15 MED ORDER — SODIUM CHLORIDE 0.9 % IV BOLUS (SEPSIS)
1000.0000 mL | Freq: Once | INTRAVENOUS | Status: AC
  Administered 2015-09-15: 1000 mL via INTRAVENOUS

## 2015-09-15 MED ORDER — HYPROMELLOSE (GONIOSCOPIC) 2.5 % OP SOLN
1.0000 [drp] | OPHTHALMIC | Status: DC | PRN
  Filled 2015-09-15: qty 15

## 2015-09-15 MED ORDER — LACTULOSE 10 GM/15ML PO SOLN
20.0000 g | Freq: Three times a day (TID) | ORAL | Status: DC
  Administered 2015-09-15 – 2015-09-20 (×16): 20 g via ORAL
  Filled 2015-09-15 (×17): qty 30

## 2015-09-15 MED ORDER — FUROSEMIDE 10 MG/ML IJ SOLN
40.0000 mg | Freq: Two times a day (BID) | INTRAMUSCULAR | Status: DC
  Administered 2015-09-15 – 2015-09-20 (×10): 40 mg via INTRAVENOUS
  Filled 2015-09-15 (×10): qty 4

## 2015-09-15 MED ORDER — VANCOMYCIN HCL IN DEXTROSE 750-5 MG/150ML-% IV SOLN: 750.0000 mg | Freq: Two times a day (BID) | INTRAVENOUS | Status: DC

## 2015-09-15 MED ORDER — LORAZEPAM 2 MG/ML IJ SOLN
1.0000 mg | Freq: Once | INTRAMUSCULAR | Status: AC
  Administered 2015-09-15: 1 mg via INTRAVENOUS
  Filled 2015-09-15: qty 1

## 2015-09-15 MED ORDER — ONDANSETRON HCL 4 MG PO TABS: 4.0000 mg | ORAL_TABLET | Freq: Four times a day (QID) | ORAL | Status: DC | PRN

## 2015-09-15 MED ORDER — LORAZEPAM 2 MG/ML IJ SOLN
1.0000 mg | INTRAMUSCULAR | Status: DC | PRN
  Administered 2015-09-15 – 2015-09-30 (×21): 1 mg via INTRAVENOUS
  Filled 2015-09-15 (×22): qty 1

## 2015-09-15 MED ORDER — SODIUM CHLORIDE 0.9 % IV SOLN
1250.0000 mg | INTRAVENOUS | Status: DC
  Administered 2015-09-15 – 2015-09-16 (×2): 1250 mg via INTRAVENOUS
  Filled 2015-09-15 (×5): qty 1250

## 2015-09-15 MED ORDER — MORPHINE SULFATE (PF) 4 MG/ML IV SOLN
4.0000 mg | Freq: Once | INTRAVENOUS | Status: AC
  Administered 2015-09-15: 4 mg via INTRAVENOUS
  Filled 2015-09-15: qty 1

## 2015-09-15 MED ORDER — ONDANSETRON HCL 4 MG/2ML IJ SOLN
4.0000 mg | Freq: Four times a day (QID) | INTRAMUSCULAR | Status: DC | PRN
  Administered 2015-09-15 – 2015-09-24 (×2): 4 mg via INTRAVENOUS
  Filled 2015-09-15 (×2): qty 2

## 2015-09-15 MED ORDER — DEXTROSE-NACL 5-0.9 % IV SOLN
INTRAVENOUS | Status: DC
  Administered 2015-09-15 – 2015-09-21 (×4): via INTRAVENOUS

## 2015-09-15 MED ORDER — CIPROFLOXACIN IN D5W 400 MG/200ML IV SOLN
400.0000 mg | Freq: Two times a day (BID) | INTRAVENOUS | Status: DC
  Administered 2015-09-15 – 2015-09-20 (×10): 400 mg via INTRAVENOUS
  Filled 2015-09-15 (×11): qty 200

## 2015-09-15 NOTE — ED Notes (Signed)
Pt continues to not tolerated ng tube.  Ng tube removed by Dr. Conley Rolls per pt request.

## 2015-09-15 NOTE — Progress Notes (Signed)
Pharmacy Antibiotic Note  Roberto [name] is a 70 y.o. male admitted on 09/15/2015 with bacteremia.  Pharmacy has been consulted for vancomycin dosing. He was on vancomycin PTA with a trough of 23 on 2/13 on a dose of 750 mg IV q12 hours.  His last dose as started "early this am" per patient but he does not think dose was completed. Renal function appears stable  Plan: Cont vancomycin 1250 mg IV q24 hours starting at 18:00 today F/u renal function and clinical course   Height:  (185.4 cm) Weight: 240 lb (108.863 kg) IBW/kg (Calculated) : 79.9  Temp (24hrs), Avg:98 F (36.7 C), Min:98 F (36.7 C), Max:98 F (36.7 C)   Recent Labs Lab 09/10/15 0325 09/11/15 0525 09/11/15 1027 09/12/15 0212 09/12/15 0213 09/13/15 0447 09/13/15 1215 09/15/15 0433  WBC 12.5* 8.3  --   --  11.0*  --   --  10.2  CREATININE 1.44* 1.30*  --  1.43*  --  1.43*  --  1.45*  LATICACIDVEN  --   --   --   --   --   --   --  1.0  VANCOTROUGH  --   --  15  --   --   --  23*  --     Estimated Creatinine Clearance: 64 mL/min (by C-G formula based on Cr of 1.45).    No Known Allergies  Antimicrobials this admission: vanc 1/29 >> 2/2, 2/8 >> Zosyn 1/29 >> 2/1 ceftaroline 2/2>> 2/8  Dose adjustments this admission: 2/1 VT 26 on 1 gm q12, dose changed to 1250 q24 2/13 VT 23 on 750 mg Q12H SCr 1.43  Thank you for allowing pharmacy to be a part of this patient's care.  Talbert Cage Poteet 09/15/2015 9:27 AM

## 2015-09-15 NOTE — ED Notes (Signed)
Dr. Conley Rolls at bedside.  Pt c/o not being able to breathe.  States he is going to pull out tube.  Pulse ox remains 100 %.

## 2015-09-15 NOTE — ED Provider Notes (Addendum)
CSN: [id number]     Arrival date & time 09/15/15  4098 History   First MD Initiated Contact with Patient 09/15/15 [phone]     Chief Complaint  Patient presents with  . Abdominal Pain     (Consider location/radiation/quality/duration/timing/severity/associated sxs/prior Treatment) HPI  This is a 70 year old male who presents from his rehabilitation facility with abdominal pain and distention. Patient had a recent prolonged admission and found to have an epidural abscess requiring surgery and is on long-term antibiotics. He is at her living facility. Over the last 2-3 days he reports increasing abdominal girth and pain. He is afraid to eat as it exacerbates his symptoms. Current pain is 10 out of 10. Denies any vomiting. Does report small bowel movements. He is receiving narcotic pain medication at the living facility for back pain. Denies any urinary symptoms. He has a Foley catheter in place. No recent fevers, shortness of breath, or chest pain.  Past Medical History  Diagnosis Date  . COPD (chronic obstructive pulmonary disease) (HCC)   . Hypertension   . Hepatic steatosis 12/14/2011  . Compression fracture of L1 lumbar vertebra (HCC) 12/14/2011  . C2 cervical fracture (HCC)   . Duodenal ulcer with hemorrhage 12/15/2011    Likely source of bleeding.per EGD; Dr. Karilyn Cota  . Esophageal varices without bleeding (HCC) 12/15/2011    per EGD  . Cirrhosis (HCC) 12/16/2011    per ultrasound  . Hx of substance abuse 12/17/2011  . 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 Hepatic encephalopathy (HCC)   . Helicobacter pylori antibody positive 12/19/2011  . Encephalopathy 05/18/2012  . UTI (urinary tract infection) 12/13/2011  . Ascites 05/18/2012    S/p paracentesis yielding 7880 mL  . Cirrhosis (HCC)   . Depression    Past Surgical History  Procedure Laterality Date  . Coronary angioplasty with stent placement    . Thoracic laminectomy for epidural abscess N/A  09/07/2015    Procedure: THORACIC FIVE-THORACIC ELEVEN LAMINECTOMY FOR EPIDURAL ABSCESS;  Surgeon: Loura Halt Ditty, MD;  Location: [facility];  Service: Neurosurgery;  Laterality: N/A;   History reviewed. No pertinent family history. Social History  Substance Use Topics  . Smoking status: Current Every Day Smoker -- 1.00 packs/day    Types: Cigarettes  . Smokeless tobacco: Current User     Comment: 1 1/2 pack a day  . Alcohol Use: Yes     Comment: drinking everyday.    Review of Systems  Constitutional: Negative.  Negative for fever.  Respiratory: Negative.  Negative for chest tightness and shortness of breath.   Cardiovascular: Negative.  Negative for chest pain.  Gastrointestinal: Positive for abdominal pain and abdominal distention. Negative for nausea, vomiting, diarrhea and constipation.  Genitourinary: Negative.  Negative for dysuria.  Musculoskeletal: Positive for back pain.  All other systems reviewed and are negative.     Allergies  Review of patient's allergies indicates no known allergies.  Home Medications   Prior to Admission medications   Medication Sig Start Date End Date Taking? Authorizing Provider  albuterol (PROVENTIL) (2.5 MG/3ML) 0.083% nebulizer solution Take 3 mLs (2.5 mg total) by nebulization every 6 (six) hours as needed for wheezing or shortness of breath. 06/10/15  Yes Oneta Rack, NP  carisoprodol (SOMA) 350 MG tablet Take 1 tablet (350 mg total) by mouth 3 (three) times daily as needed for muscle spasms. 09/13/15  Yes Hollice Espy, MD  diphenhydrAMINE (BENADRYL) 25 MG tablet Take 1 tablet (25 mg total)  by mouth at bedtime as needed for sleep. 06/19/15  Yes Marily Memos, MD  furosemide (LASIX) 20 MG tablet Take 20 mg by mouth daily. 08/17/15  Yes Historical Provider, MD  HYDROcodone-acetaminophen (NORCO/VICODIN) 5-325 MG tablet Take 1 tablet by mouth every 6 (six) hours as needed for severe pain. 09/13/15  Yes Hollice Espy, MD   hydroxypropyl methylcellulose / hypromellose (ISOPTO TEARS / GONIOVISC) 2.5 % ophthalmic solution Place 1 drop into both eyes as needed for dry eyes. Reported on 07/22/2015   Yes Historical Provider, MD  ibuprofen (ADVIL,MOTRIN) 200 MG tablet Take 800 mg by mouth every 6 (six) hours as needed for mild pain.   Yes Historical Provider, MD  lactulose (CHRONULAC) 10 GM/15ML solution Take 15 mLs by mouth 3 (three) times daily as needed. 07/22/15  Yes Historical Provider, MD  omeprazole (PRILOSEC) 20 MG capsule Take 20 mg by mouth daily.   Yes Historical Provider, MD  spironolactone (ALDACTONE) 25 MG tablet Take 1 tablet (25 mg total) by mouth daily. 07/22/15  Yes Len Blalock, NP  Vancomycin (VANCOCIN) 750 MG/150ML SOLN Inject 150 mLs (750 mg total) into the vein every 12 (twelve) hours. 09/13/15 11/02/15 Yes Hollice Espy, MD  Menthol-Methyl Salicylate [facility] ARTHRITIS FORMULA EX) Apply 1 application topically 3 (three) times daily as needed.    Historical Provider, MD   BP 119/58 mmHg  Pulse 67  Temp(Src) 98 F (36.7 C) (Oral)  Resp 18  Ht  (1.854 m)  Wt 240 lb (108.863 kg)  BMI 31.67 kg/m2  SpO2 99% Physical Exam  Constitutional: He is oriented to person, place, and time. He appears well-developed and well-nourished.  Elderly, uncomfortable appearing  HENT:  Head: Normocephalic and atraumatic.  Cardiovascular: Normal rate, regular rhythm and normal heart sounds.   No murmur heard. Pulmonary/Chest: Effort normal and breath sounds normal. No respiratory distress. He has no wheezes.  Abdominal: Soft. Bowel sounds are normal. He exhibits distension. There is tenderness. There is no rebound.  Diffuse tenderness to palpation with distention, bowel sounds present, reducible umbilical hernia  Musculoskeletal: He exhibits no edema.  Neurological: He is alert and oriented to person, place, and time.  Skin: Skin is warm and dry.  Psychiatric: He has a normal mood and affect.  Nursing  note and vitals reviewed.   ED Course  Procedures (including critical care time) Labs Review Labs Reviewed  CBC WITH DIFFERENTIAL/PLATELET - Abnormal; Notable for the following:    RBC 2.52 (*)    Hemoglobin 8.5 (*)    HCT 24.9 (*)    RDW 22.4 (*)    Monocytes Absolute 1.4 (*)    All other components within normal limits  COMPREHENSIVE METABOLIC PANEL - Abnormal; Notable for the following:    Sodium 130 (*)    Glucose, Bld 105 (*)    BUN 36 (*)    Creatinine, Ser 1.45 (*)    Calcium 7.4 (*)    Total Protein 6.2 (*)    Albumin 1.6 (*)    AST 76 (*)    Total Bilirubin 1.5 (*)    GFR calc non Af Amer 48 (*)    GFR calc Af Amer 56 (*)    Anion gap 3 (*)    All other components within normal limits  LIPASE, BLOOD - Abnormal; Notable for the following:    Lipase 64 (*)    All other components within normal limits  URINALYSIS, ROUTINE W REFLEX MICROSCOPIC (NOT AT [facility]) - Abnormal; Notable for  the following:    Hgb urine dipstick LARGE (*)    Leukocytes, UA TRACE (*)    All other components within normal limits  URINE MICROSCOPIC-ADD ON - Abnormal; Notable for the following:    Squamous Epithelial / LPF 0-5 (*)    Bacteria, UA MANY (*)    All other components within normal limits  LACTIC ACID, PLASMA    Imaging Review Dg Abd Acute W/chest  09/15/2015  CLINICAL DATA:  Back surgery last week. Currently it rehab dilatation. Increasing abdominal bloating for 3 days. Diffuse abdominal pain and swelling. EXAM: DG ABDOMEN ACUTE W/ 1V CHEST COMPARISON:  Abdomen 09/01/2015.  Chest 08/28/2015. FINDINGS: Right PICC line is been placed with tip over the cavoatrial junction region. Shallow inspiration with linear atelectasis in the lung bases, demonstrating improvement since previous study. Normal heart size and pulmonary vascularity. No blunting of costophrenic angles. No pneumothorax. Tortuous aorta. Skin clips over the midline consistent with recent surgery. Gaseous distention of colon and  mid abdominal small bowel. No free pattern. No abnormal air-fluid levels. Changes most likely represent ileus. No radiopaque stones. Calcified phleboliths in the pelvis. Degenerative changes in the spine. IMPRESSION: Improving atelectasis in the lung bases. Diffuse gas distention of small and large bowel most likely to represent ileus. Electronically Signed   By: Burman Nieves M.D.   On: 09/15/2015 05:28   I have personally reviewed and evaluated these images and lab results as part of my medical decision-making.   EKG Interpretation None      MDM   Final diagnoses:  Generalized abdominal pain  Ileus (HCC)   Patient with worsening abdominal pain and distention. Very distended on exam. No signs of peritonitis. He does have a history of cirrhosis but is currently on antibiotics for epidural abscess. Basic labwork obtained. Patient given pain and nausea medication. Patient also given fluids. Normal EF.  Lab work is largely unremarkable. Mild hyponatremia. Stable creatinine. No leukocytosis. Lactate is normal. Acute abdominal series suggestive of ileus. This is fitting in the setting of recent back surgery and narcotic use. On recheck, patient is reporting persistent pain. Discuss with patient that narcotic pain medication would likely exacerbate any ileus. Pain does seem somewhat out of portion. Given history of cirrhosis, will obtain CT to further evaluate and ensure no coexisting cause for pain. Patient has no obstructive symptoms and is not vomiting. He he reports "they are cleaning me up like i've had a bowel movement" but is unsure of how much he is actually moving his bowel.  However, he is on lactulose 3 times a day. Suspect given the extent of abdominal distention and pain, patient may need admission for further management.  7:25 AM Patient reports that he is not tolerating oral contrast well.  Reports persistent pain. Bedside ultrasound does not show large enough area to do a paracentesis.  Only approximately 2 cm of fluid noted in the right lower quadrant. While his symptoms are most suggestive of postop ileus, SBP would also be a consideration.  Patient sent to CT. Will admit to the hospitalist.      Shon Baton, MD 09/15/15 4098  7:39 AM Discussed with Dr. Conley Rolls.  He has requested NG tube placement. Patient is not actively vomiting but feel this would be best for gastric decompression. Will also cover the patient with Cipro for possible SBP.  Admit to Med-Surg.  Shon Baton, MD 09/15/15 [phone]

## 2015-09-15 NOTE — H&P (Signed)
[facility] History and Physical  Roberto [name] ZOX:[id number] DOB: 03-03-1946    PCP:   Isabella Stalling, MD   Chief Complaint: nausea and vomiting.  HPI: Roberto [name] is an 70 y.o. 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 He underwent a T5-T11 laminectomy with evacuation of epidural abscess on the evening of 2/7. initial blood cultures positive for MRSA.  He requires 8 weeks total of IV antibiotics starting 2/8 until 4/4.  Evaluation in the ER included a Cr of 1.45, Na of 130, Hb of 8,5 g per dL, Lipase of 64, normal K, and abdominal pelvic CT showed ascites, along with evidence of ileus, and foley balloon in the prostatic urethra.   NGT was placed, and IV Cipro was given.  Patient couldn't tolerate the NGT so I discontinued it.  He was given ativan IV 1mg  which helped some.  Hospitalist was asked to admit him for ileus, post op, aggravated with narcotics, ascites, and abdominal pain.    Rewiew of Systems:  Constitutional: Negative for malaise, fever and chills. No significant weight loss or weight gain Eyes: Negative for eye pain, redness and discharge, diplopia, visual changes, or flashes of light. ENMT: Negative for ear pain, hoarseness, nasal congestion, sinus pressure and sore throat. No headaches; tinnitus, drooling, or problem swallowing. Cardiovascular: Negative for chest pain, palpitations, diaphoresis, dyspnea and peripheral edema. ; No orthopnea, PND Respiratory: Negative for cough, hemoptysis, wheezing and stridor. No pleuritic chestpain. Gastrointestinal: Negative for  diarrhea, melena, blood in stool, hematemesis, jaundice and rectal bleeding.    Genitourinary: Negative for frequency, dysuria, incontinence,flank pain and hematuria; Musculoskeletal: Negative for back pain and neck pain. Negative for swelling  and trauma.;  Skin: . Negative for pruritus, rash, abrasions, bruising and skin lesion.; ulcerations Neuro: Negative for headache, lightheadedness and neck stiffness. Negative for weakness, altered level of consciousness , altered mental status, extremity weakness, burning feet, involuntary movement, seizure and syncope.  Psych: negative for anxiety, depression, insomnia, tearfulness, panic attacks, hallucinations, paranoia, suicidal or homicidal ideation   Past Medical History  Diagnosis Date  . COPD (chronic obstructive pulmonary disease) (HCC)   . Hypertension   . Hepatic steatosis 12/14/2011  . Compression fracture of L1 lumbar vertebra (HCC) 12/14/2011  . C2 cervical fracture (HCC)   . Duodenal ulcer with hemorrhage 12/15/2011    Likely source of bleeding.per EGD; Dr. Karilyn Cota  . Esophageal varices without bleeding (HCC) 12/15/2011    per EGD  . Cirrhosis (HCC) 12/16/2011    per ultrasound  . Hx of substance abuse 12/17/2011  . 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 Hepatic encephalopathy (HCC)   . Helicobacter pylori antibody positive 12/19/2011  . Encephalopathy 05/18/2012  . UTI (urinary tract infection) 12/13/2011  . Ascites 05/18/2012    S/p paracentesis yielding 7880 mL  . Cirrhosis (HCC)   . Depression     Past Surgical History  Procedure Laterality Date  . Coronary angioplasty with stent placement    . Thoracic laminectomy for epidural abscess N/A 09/07/2015    Procedure: THORACIC FIVE-THORACIC ELEVEN LAMINECTOMY FOR EPIDURAL ABSCESS;  Surgeon: Loura Halt Ditty, MD;  Location: [facility];  Service: Neurosurgery;  Laterality: N/A;    Medications:  HOME MEDS: Prior to Admission medications   Medication Sig Start Date End Date Taking? Authorizing Provider  albuterol (PROVENTIL) (2.5 MG/3ML) 0.083%  nebulizer solution Take 3 mLs (2.5 mg total) by nebulization every 6 (six) hours as needed for wheezing or shortness of breath. 06/10/15   Yes Oneta Rack, NP  carisoprodol (SOMA) 350 MG tablet Take 1 tablet (350 mg total) by mouth 3 (three) times daily as needed for muscle spasms. 09/13/15  Yes Hollice Espy, MD  diphenhydrAMINE (BENADRYL) 25 MG tablet Take 1 tablet (25 mg total) by mouth at bedtime as needed for sleep. 06/19/15  Yes Marily Memos, MD  furosemide (LASIX) 20 MG tablet Take 20 mg by mouth daily. 08/17/15  Yes Historical Provider, MD  HYDROcodone-acetaminophen (NORCO/VICODIN) 5-325 MG tablet Take 1 tablet by mouth every 6 (six) hours as needed for severe pain. 09/13/15  Yes Hollice Espy, MD  hydroxypropyl methylcellulose / hypromellose (ISOPTO TEARS / GONIOVISC) 2.5 % ophthalmic solution Place 1 drop into both eyes as needed for dry eyes. Reported on 07/22/2015   Yes Historical Provider, MD  ibuprofen (ADVIL,MOTRIN) 200 MG tablet Take 800 mg by mouth every 6 (six) hours as needed for mild pain.   Yes Historical Provider, MD  lactulose (CHRONULAC) 10 GM/15ML solution Take 15 mLs by mouth 3 (three) times daily as needed. 07/22/15  Yes Historical Provider, MD  omeprazole (PRILOSEC) 20 MG capsule Take 20 mg by mouth daily.   Yes Historical Provider, MD  spironolactone (ALDACTONE) 25 MG tablet Take 1 tablet (25 mg total) by mouth daily. 07/22/15  Yes Len Blalock, NP  Vancomycin (VANCOCIN) 750 MG/150ML SOLN Inject 150 mLs (750 mg total) into the vein every 12 (twelve) hours. 09/13/15 11/02/15 Yes Hollice Espy, MD  Menthol-Methyl Salicylate [facility] ARTHRITIS FORMULA EX) Apply 1 application topically 3 (three) times daily as needed.    Historical Provider, MD     Allergies:  No Known Allergies  Social History:   reports that he has been smoking Cigarettes.  He has been smoking about 1.00 pack per day. He uses smokeless tobacco. He reports that he drinks alcohol. He reports that he uses illicit drugs (Cocaine).  Family History: History reviewed. No pertinent family history.   Physical Exam: Filed Vitals:    09/15/15 0343 09/15/15 0345 09/15/15 0548  BP: 112/64 112/64 119/58  Pulse: 65 65 67  Temp: 98 F (36.7 C) 98 F (36.7 C)   TempSrc: Oral Oral   Resp:  20 18  Height:   (1.854 m)   Weight:  108.863 kg (240 lb)   SpO2: 93% 97% 99%   Blood pressure 119/58, pulse 67, temperature 98 F (36.7 C), temperature source Oral, resp. rate 18, height  (1.854 m), weight 108.863 kg (240 lb), SpO2 99 %.  GEN:  Pleasant  patient lying in the stretcher in no acute distress; cooperative with exam. PSYCH:  alert and oriented x4; does not appear anxious or depressed; affect is appropriate. HEENT: Mucous membranes pink and anicteric; PERRLA; EOM intact; no cervical lymphadenopathy nor thyromegaly or carotid bruit; no JVD; There were no stridor. Neck is very supple. Breasts:: Not examined CHEST WALL: No tenderness CHEST: Normal respiration, clear to auscultation bilaterally.  HEART: Regular rate and rhythm.  There are no murmur, rub, or gallops.   BACK: No kyphosis or scoliosis; no CVA tenderness ABDOMEN: distended, with hypertympani, no masses, no organomegaly, normal abdominal bowel sounds; no pannus; no intertriginous candida. There is no rebound and no distention. Rectal Exam: Not done EXTREMITIES: No bone or joint deformity; age-appropriate arthropathy of the hands and knees; no edema; no ulcerations.  There is no calf tenderness. Genitalia: not examined PULSES: 2+ and symmetric SKIN: Normal hydration no rash or ulceration CNS: Cranial nerves 2-12 grossly intact no focal lateralizing neurologic deficit.  Speech is fluent; uvula elevated with phonation, facial symmetry and tongue midline. DTR are normal bilaterally, cerebella exam is intact, barbinski is negative and strengths are equaled bilaterally.  No sensory loss.   Labs on Admission:  Basic Metabolic Panel:  Recent Labs Lab 09/10/15 0325 09/11/15 0525 09/12/15 0212 09/13/15 0447 09/15/15 0433  NA 135 134* 134* 133* 130*  K 4.9  4.5 4.8 4.3 4.3  CL 108 108 106 108 105  CO2 21* 22  GLUCOSE 117* 101* 129* 102* 105*  BUN 37* 34* 32* 30* 36*  CREATININE 1.44* 1.30* 1.43* 1.43* 1.45*  CALCIUM 7.8* 7.5* 7.8* 7.5* 7.4*   Liver Function Tests:  Recent Labs Lab 09/15/15 0433  AST 76*  ALT 33  ALKPHOS 88  BILITOT 1.5*  PROT 6.2*  ALBUMIN 1.6*    Recent Labs Lab 09/15/15 0433  LIPASE 64*   CBC:  Recent Labs Lab 09/10/15 0325 09/11/15 0525 09/12/15 0213 09/15/15 0433  WBC 12.5* 8.3 11.0* 10.2  NEUTROABS  --   --   --  6.6  HGB 7.7* 7.0* 7.9* 8.5*  HCT 24.1* 21.3* 24.0* 24.9*  MCV 98.0 100.0 97.2 98.8  PLT 240 187 195 171    Radiological Exams on Admission: Ct Abdomen Pelvis Wo Contrast  09/15/2015  CLINICAL DATA:  Abdominal pain and distention following epidural abscess surgery on 09/07/2015. Additional history of cirrhosis and duodenum ulcer. EXAM: CT ABDOMEN AND PELVIS WITHOUT CONTRAST TECHNIQUE: Multidetector CT imaging of the abdomen and pelvis was performed following the standard protocol without IV contrast. COMPARISON:  CT abdomen pelvis 09/01/2015 FINDINGS: Lower chest: Atelectasis at the LEFT lung base. Hepatobiliary: The liver is contracted. There is a moderate volume of fluid within the abdomen consistent with ascites. The caudate lobe is enlarged. High-density dependent material within the gallbladder lumen consists of milk-of-calcium. No evidence acute cholecystitis. Pancreas: Pancreas is normal. No ductal dilatation. No pancreatic inflammation. Spleen: Normal spleen Adrenals/urinary tract: Adrenal glands and kidneys are normal. Ureters are normal. The retention balloon of Foley catheter is expanded in the prostatic urethra. The tip of the catheter just enters bladder. The bladder is mildly distended. Stomach/Bowel: Stomach, small bowel, appendix, and cecum are normal. The colon and rectosigmoid colon are normal. Vascular/Lymphatic: Abdominal aorta is normal caliber with atherosclerotic  calcification. There is no retroperitoneal or periportal lymphadenopathy. No pelvic lymphadenopathy. Reproductive: Foley catheter retention balloon is expanded in the prostatic urethra. Other: Moderate free fluid the abdomen pelvis. Fluid extends into a RIGHT inguinal hernia. Musculoskeletal: No aggressive osseous lesion. Postsurgical change in the lower thoracic spine with laminectomy. No organized fluid collection identified. IMPRESSION: 1. Moderate volume ascites in the abdomen and pelvis. Morphologic changes of liver consistent cirrhosis. 2. No evidence of bowel obstruction. 3. Retention balloon of the Foley catheter is expanded in the prostatic urethra. The bladder is mildly distended. Recommend repositioning of Foley catheter. 4. A postsurgical change in the lower thoracic spine out evidence complication. 5.  Atherosclerotic calcification of the abdominal aorta. Findings conveyed toNurse JJ on 09/15/2015  at08:06. Electronically Signed   By: Genevive Bi M.D.   On: 09/15/2015 08:11   Dg Abd Acute W/chest  09/15/2015  CLINICAL DATA:  Back surgery last week. Currently it rehab dilatation. Increasing abdominal bloating for 3 days. Diffuse abdominal pain and swelling. EXAM:  DG ABDOMEN ACUTE W/ 1V CHEST COMPARISON:  Abdomen 09/01/2015.  Chest 08/28/2015. FINDINGS: Right PICC line is been placed with tip over the cavoatrial junction region. Shallow inspiration with linear atelectasis in the lung bases, demonstrating improvement since previous study. Normal heart size and pulmonary vascularity. No blunting of costophrenic angles. No pneumothorax. Tortuous aorta. Skin clips over the midline consistent with recent surgery. Gaseous distention of colon and mid abdominal small bowel. No free pattern. No abnormal air-fluid levels. Changes most likely represent ileus. No radiopaque stones. Calcified phleboliths in the pelvis. Degenerative changes in the spine. IMPRESSION: Improving atelectasis in the lung bases.  Diffuse gas distention of small and large bowel most likely to represent ileus. Electronically Signed   By: Burman Nieves M.D.   On: 09/15/2015 05:28   Assessment/Plan Present on Admission:  . Ileus (HCC) . Adynamic ileus (HCC) . Ascites . Chronic diastolic heart failure (HCC) . Cirrhosis (HCC) . Debility . Epidural abscess . HTN (hypertension) . Abdominal pain  PLAN:  Ileus:  Will avoid narcotics.  He couldn't tolerate NGT, so it had to be discontinued.  Will give dulcolax suppository and continue with lactulose, with increasing dosage.  He has hx of CHF, and ascites, so we will give him IV Lasix.  Follow with serial KUBs.  Ascites:  Continue with diuretics.  Will add Cipro to Vancomycin.    Epidural abscess:  Continue with IV Vancomycin.  Follow renal studies closely.  Anemia:  Chronic.  Stable.  Urinary retention:  S/p balloon in prostatic urethra.  Will d/c foley, use condom cath.  Replace if urinary retention.  Other plans as per orders. Code Status: FULL Unk Lightning, MD. FACP [facility] Pager [phone] 7pm to 7am.  09/15/2015, 8:32 AM

## 2015-09-15 NOTE — ED Notes (Signed)
Pt drank 1/2 bottle of contrast and states he can't drink anymore.  Dr. Heidi Dach for pt to go ahead with CT.

## 2015-09-15 NOTE — ED Notes (Signed)
Condemn catheter applied.

## 2015-09-15 NOTE — ED Notes (Signed)
Foley catheter removed

## 2015-09-15 NOTE — ED Notes (Addendum)
Ng tube inserted.   12 french ng tube inserted in right nare.  Pt did not tolerate well.  Pulse ox 100% and pt talking after tube inserted.  Placement checked by auscultating air in stomach.  Ng tube placed to LIS.

## 2015-09-15 NOTE — ED Notes (Signed)
Patient had back surgery last week at [facility], currently [facility] for rehabilitation, has had increased abdominal bloating over last 2-3 days.

## 2015-09-16 DIAGNOSIS — A4902 Methicillin resistant Staphylococcus aureus infection, unspecified site: Secondary | ICD-10-CM

## 2015-09-16 DIAGNOSIS — R1084 Generalized abdominal pain: Secondary | ICD-10-CM

## 2015-09-16 DIAGNOSIS — R188 Other ascites: Secondary | ICD-10-CM

## 2015-09-16 DIAGNOSIS — R140 Abdominal distension (gaseous): Secondary | ICD-10-CM

## 2015-09-16 DIAGNOSIS — F1010 Alcohol abuse, uncomplicated: Secondary | ICD-10-CM

## 2015-09-16 DIAGNOSIS — K567 Ileus, unspecified: Secondary | ICD-10-CM

## 2015-09-16 LAB — COMPREHENSIVE METABOLIC PANEL
ALBUMIN: 1.5 g/dL — AB (ref 3.5–5.0)
ALK PHOS: 79 U/L (ref 38–126)
ALT: 28 U/L (ref 17–63)
AST: 60 U/L — ABNORMAL HIGH (ref 15–41)
Anion gap: 6 (ref 5–15)
BUN: 41 mg/dL — ABNORMAL HIGH (ref 6–20)
CALCIUM: 7.4 mg/dL — AB (ref 8.9–10.3)
CO2: 21 mmol/L — AB (ref 22–32)
CREATININE: 1.83 mg/dL — AB (ref 0.61–1.24)
Chloride: 106 mmol/L (ref 101–111)
GFR calc Af Amer: 42 mL/min — ABNORMAL LOW (ref >60)
GFR calc non Af Amer: 37 mL/min — ABNORMAL LOW (ref >60)
GLUCOSE: 96 mg/dL (ref 65–99)
Potassium: 4.4 mmol/L (ref 3.5–5.1)
SODIUM: 133 mmol/L — AB (ref 135–145)
Total Bilirubin: 1.9 mg/dL — ABNORMAL HIGH (ref 0.3–1.2)
Total Protein: 5.7 g/dL — ABNORMAL LOW (ref 6.5–8.1)

## 2015-09-16 LAB — CBC
HCT: 22.8 % — ABNORMAL LOW (ref 39.0–52.0)
HEMOGLOBIN: 7.8 g/dL — AB (ref 13.0–17.0)
MCH: 34.4 pg — AB (ref 26.0–34.0)
MCHC: 34.2 g/dL (ref 30.0–36.0)
MCV: 100.4 fL — AB (ref 78.0–100.0)
PLATELETS: 153 10*3/uL (ref 150–400)
RBC: 2.27 MIL/uL — ABNORMAL LOW (ref 4.22–5.81)
RDW: 22.2 % — ABNORMAL HIGH (ref 11.5–15.5)
WBC: 9.2 10*3/uL (ref 4.0–10.5)

## 2015-09-16 LAB — MAGNESIUM: Magnesium: 1.8 mg/dL (ref 1.7–2.4)

## 2015-09-16 LAB — PROTIME-INR
INR: 1.6 — ABNORMAL HIGH (ref 0.00–1.49)
PROTHROMBIN TIME: 19.1 s — AB (ref 11.6–15.2)

## 2015-09-16 MED ORDER — ENSURE ENLIVE PO LIQD
237.0000 mL | Freq: Two times a day (BID) | ORAL | Status: DC
  Administered 2015-09-16 – 2015-09-30 (×22): 237 mL via ORAL

## 2015-09-16 MED ORDER — TRAMADOL HCL 50 MG PO TABS
50.0000 mg | ORAL_TABLET | Freq: Once | ORAL | Status: AC
Start: 2015-09-16 — End: 2015-09-16
  Administered 2015-09-16: 50 mg via ORAL
  Filled 2015-09-16: qty 1

## 2015-09-16 MED ORDER — ALBUMIN HUMAN 25 % IV SOLN
25.0000 g | Freq: Every day | INTRAVENOUS | Status: DC
  Administered 2015-09-17: 25 g via INTRAVENOUS
  Filled 2015-09-16 (×4): qty 100

## 2015-09-16 NOTE — Progress Notes (Signed)
2242 Dr.Kim notified regarding patients c/o back pain rated 9 out of 10. New order for Tramadol  PO x 1 time only given.

## 2015-09-16 NOTE — Progress Notes (Signed)
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 operated on from T5-T9 patient currently controlled on vancomycin 750 every 12 for a period of 8 weeks and tension density with some abdominal distention x-ray suggested subsequent CT scan shows normal anatomy of the stomach and small bowel and cecum and colon will be started on clear liquids now due to severe hypoproteinemia and hypoalbuminemia with the addition of ensure twice a day gastroenterology consult requested for further management Roberto [name] GNF:[id number] DOB: [birth year] DOA: 09/15/2015 PCP: Isabella Stalling, MD             Physical Exam: Blood pressure 111/65, pulse 66, temperature 99.4 F (37.4 C), temperature source Oral, resp. rate 18, height 6\' 1"  (1.854 m), weight 240 lb (108.863 kg), SpO2 99 %. lungs clear to A&P prolonged expiratory phase no rales no wheezes appreciable heart regular rhythm no S3-S4 no heaves thrills rubs abdomen protuberant bowel sounds normoactive no peristaltic rushes no guarding no rebound no detectable organomegaly extremities chronic pitting edema bilaterally patient moves left foot and wiggles toes of right foot only regular grossly intact   Investigations:  Recent Results (from the past 240 hour(s))  MRSA PCR Screening     Status: Abnormal   Collection Time: 09/07/15  6:56 PM  Result Value Ref Range Status   MRSA by PCR POSITIVE (A) NEGATIVE Final    Comment:        The GeneXpert MRSA Assay (FDA approved for NASAL specimens only), is one component of a comprehensive MRSA colonization surveillance program. It is not intended to diagnose MRSA infection nor to guide or monitor treatment for MRSA infections. RESULT CALLED TO, READ BACK BY AND VERIFIED WITH: M [email or web address]  09/06/15 MKELLY   Anaerobic culture     Status: None   Collection Time: 09/07/15  8:39 PM  Result Value Ref Range Status    Specimen Description ABSCESS  Final   Special Requests EPIDURAL ABSCESS  Final   Gram Stain   Final    NO WBC SEEN NO SQUAMOUS EPITHELIAL CELLS SEEN NO ORGANISMS SEEN Performed at [facility]    Culture   Final    NO ANAEROBES ISOLATED Performed at [facility]    Report Status 09/12/2015 FINAL  Final  Culture, routine-abscess     Status: None   Collection Time: 09/07/15  8:39 PM  Result Value Ref Range Status   Specimen Description ABSCESS  Final   Special Requests EPIDURAL ABSCESS  Final   Gram Stain   Final    NO WBC SEEN NO SQUAMOUS EPITHELIAL CELLS SEEN NO ORGANISMS SEEN Performed at [facility]    Culture   Final    NO GROWTH 2 DAYS Performed at [facility]    Report Status 09/10/2015 FINAL  Final  Anaerobic culture     Status: None   Collection Time: 09/07/15  9:20 PM  Result Value Ref Range Status   Specimen Description ABSCESS  Final   Special Requests THORACIC EPIDURAL  Final   Gram Stain   Final    FEW WBC PRESENT, PREDOMINANTLY PMN NO SQUAMOUS EPITHELIAL CELLS SEEN NO ORGANISMS SEEN Performed at [facility]    Culture   Final    NO ANAEROBES ISOLATED Performed at [facility]    Report Status 09/12/2015 FINAL  Final  Culture, routine-abscess     Status: None   Collection Time: 09/07/15  9:20 PM  Result Value Ref  Range Status   Specimen Description ABSCESS  Final   Special Requests THORACIC EPIDURAL  Final   Gram Stain   Final    FEW WBC PRESENT, PREDOMINANTLY PMN NO SQUAMOUS EPITHELIAL CELLS SEEN NO ORGANISMS SEEN Performed at [facility]    Culture   Final    RARE METHICILLIN RESISTANT STAPHYLOCOCCUS AUREUS Note: RIFAMPIN AND GENTAMICIN SHOULD NOT BE USED AS SINGLE DRUGS FOR TREATMENT OF STAPH INFECTIONS. CRITICAL RESULT CALLED TO, READ BACK BY AND VERIFIED WITH: FAITH H. AT 9:38AM ON 09/11/2015 HAJAM Performed at [facility]    Report Status 09/11/2015 FINAL  Final    Organism ID, Bacteria METHICILLIN RESISTANT STAPHYLOCOCCUS AUREUS  Final      Susceptibility   Methicillin resistant staphylococcus aureus - MIC*    CLINDAMYCIN <=0.25 SENSITIVE Sensitive     ERYTHROMYCIN <=0.25 SENSITIVE Sensitive     GENTAMICIN <=0.5 SENSITIVE Sensitive     LEVOFLOXACIN >=8 RESISTANT Resistant     OXACILLIN >=4 RESISTANT Resistant     RIFAMPIN <=0.5 SENSITIVE Sensitive     TRIMETH/SULFA >=320 RESISTANT Resistant     VANCOMYCIN 1 SENSITIVE Sensitive     TETRACYCLINE <=1 SENSITIVE Sensitive     * RARE METHICILLIN RESISTANT STAPHYLOCOCCUS AUREUS     Basic Metabolic Panel:  Recent Labs  16/10/96 0433 09/16/15 0601  NA 130* 133*  K 4.3 4.4  CL 105 106  CO2 22 21*  GLUCOSE 105* 96  BUN 36* 41*  CREATININE 1.45* 1.83*  CALCIUM 7.4* 7.4*   Liver Function Tests:  Recent Labs  09/15/15 0433 09/16/15 0601  AST 76* 60*  ALT 33 28  ALKPHOS 88 79  BILITOT 1.5* 1.9*  PROT 6.2* 5.7*  ALBUMIN 1.6* 1.5*     CBC:  Recent Labs  09/15/15 0433 09/16/15 0601  WBC 10.2 9.2  NEUTROABS 6.6  --   HGB 8.5* 7.8*  HCT 24.9* 22.8*  MCV 98.8 100.4*  PLT 171 153    Ct Abdomen Pelvis Wo Contrast  09/15/2015  CLINICAL DATA:  Abdominal pain and distention following epidural abscess surgery on 09/07/2015. Additional history of cirrhosis and duodenum ulcer. EXAM: CT ABDOMEN AND PELVIS WITHOUT CONTRAST TECHNIQUE: Multidetector CT imaging of the abdomen and pelvis was performed following the standard protocol without IV contrast. COMPARISON:  CT abdomen pelvis 09/01/2015 FINDINGS: Lower chest: Atelectasis at the LEFT lung base. Hepatobiliary: The liver is contracted. There is a moderate volume of fluid within the abdomen consistent with ascites. The caudate lobe is enlarged. High-density dependent material within the gallbladder lumen consists of milk-of-calcium. No evidence acute cholecystitis. Pancreas: Pancreas is normal. No ductal dilatation. No pancreatic inflammation.  Spleen: Normal spleen Adrenals/urinary tract: Adrenal glands and kidneys are normal. Ureters are normal. The retention balloon of Foley catheter is expanded in the prostatic urethra. The tip of the catheter just enters bladder. The bladder is mildly distended. Stomach/Bowel: Stomach, small bowel, appendix, and cecum are normal. The colon and rectosigmoid colon are normal. Vascular/Lymphatic: Abdominal aorta is normal caliber with atherosclerotic calcification. There is no retroperitoneal or periportal lymphadenopathy. No pelvic lymphadenopathy. Reproductive: Foley catheter retention balloon is expanded in the prostatic urethra. Other: Moderate free fluid the abdomen pelvis. Fluid extends into a RIGHT inguinal hernia. Musculoskeletal: No aggressive osseous lesion. Postsurgical change in the lower thoracic spine with laminectomy. No organized fluid collection identified. IMPRESSION: 1. Moderate volume ascites in the abdomen and pelvis. Morphologic changes of liver consistent cirrhosis. 2. No evidence of bowel obstruction. 3. Retention balloon of  the Foley catheter is expanded in the prostatic urethra. The bladder is mildly distended. Recommend repositioning of Foley catheter. 4. A postsurgical change in the lower thoracic spine out evidence complication. 5.  Atherosclerotic calcification of the abdominal aorta. Findings conveyed toNurse JJ on 09/15/2015  at08:06. Electronically Signed   By: Genevive Bi M.D.   On: 09/15/2015 08:11   Dg Abd Acute W/chest  09/15/2015  CLINICAL DATA:  Back surgery last week. Currently it rehab dilatation. Increasing abdominal bloating for 3 days. Diffuse abdominal pain and swelling. EXAM: DG ABDOMEN ACUTE W/ 1V CHEST COMPARISON:  Abdomen 09/01/2015.  Chest 08/28/2015. FINDINGS: Right PICC line is been placed with tip over the cavoatrial junction region. Shallow inspiration with linear atelectasis in the lung bases, demonstrating improvement since previous study. Normal heart size  and pulmonary vascularity. No blunting of costophrenic angles. No pneumothorax. Tortuous aorta. Skin clips over the midline consistent with recent surgery. Gaseous distention of colon and mid abdominal small bowel. No free pattern. No abnormal air-fluid levels. Changes most likely represent ileus. No radiopaque stones. Calcified phleboliths in the pelvis. Degenerative changes in the spine. IMPRESSION: Improving atelectasis in the lung bases. Diffuse gas distention of small and large bowel most likely to represent ileus. Electronically Signed   By: Burman Nieves M.D.   On: 09/15/2015 05:28      Medications:  Impression: Active Problems:   HTN (hypertension)   Renal insufficiency   Debility   Cirrhosis (HCC)   Hx of substance abuse   Adynamic ileus (HCC)   Ascites   Epidural abscess   Chronic diastolic heart failure (HCC)   Ileus (HCC)   Abdominal pain     Plan: Continue vancomycin 750 every 12 for a period of 8 total weeks begin clear liquids at present with an sure twice a day for 14-calorie malnutrition compared to cirrhosis GI consultation for further management  Consultants: Gastroenterology requested   Procedures   Antibiotics: Vancomycin 750 IV every 12                  Code Status: Full  Family Communication:    Disposition Plan   Time spent: 30 minutes   LOS: 1 day   Paul Torpey M   09/16/2015, 1:48 PM

## 2015-09-16 NOTE — Clinical Social Work Note (Signed)
Clinical Social Work Assessment  Patient Details  Name: Roberto [name] MRN: [id number] Date of Birth: 1946-03-01  Date of referral:  09/16/15               Reason for consult:  Facility Placement                Permission sought to share information with:    Permission granted to share information::     Name::        Agency::     Relationship::     Contact Information:     Housing/Transportation Living arrangements for the past 2 months:  Skilled Holiday representative, Actor of Information:  Patient Patient Interpreter Needed:  None Criminal Activity/Legal Involvement Pertinent to Current Situation/Hospitalization:  No - Comment as needed Significant Relationships:  Other Family Members Lives with:  Self, Facility Resident Do you feel safe going back to the place where you live?  Yes Need for family participation in patient care:  Yes (Comment)  Care giving concerns:  None identified.    Social Worker assessment / plan: Patient advised that he has been at [facility] for two days for rehab.  He stated that at baseline he lives alone, ambulates unassisted and does not require assistance with his ADLs.  He advised that he plans on returning to [facility] to complete his rehab at discharge.  CSW spoke with Carollee Herter at [facility].  She confirmed patient's statements and advised that patient could return to the facility. She indicated that patient's mother is a resident at the facililty also.   Employment status:  Retired Health and safety inspector:  Medicare PT Recommendations:  Not assessed at this time Information / Referral to community resources:     Patient/Family's Response to care:  Patient is agreeable to return to the facility for rehab.   Patient/Family's Understanding of and Emotional Response to Diagnosis, Current Treatment, and Prognosis:  Patient understands his diagnosis, treatment and prognosis.   Emotional Assessment Appearance:  Developmentally  appropriate Attitude/Demeanor/Rapport:   (Cooperative) Affect (typically observed):  Accepting, Calm Orientation:  Oriented to Self, Oriented to Place, Oriented to  Time, Oriented to Situation Alcohol / Substance use:  Not Applicable Psych involvement (Current and /or in the community):  No (Comment)  Discharge Needs  Concerns to be addressed:  Discharge Planning Concerns Readmission within the last 30 days:  Yes Current discharge risk:  None Barriers to Discharge:  No Barriers Identified   Annice Needy, LCSW 09/16/2015, 10:54 AM

## 2015-09-16 NOTE — Consult Note (Signed)
Referring Provider: No ref. provider found Primary Care Physician:  Isabella Stalling, MD Primary Gastroenterologist:  Dr. Karilyn Cota  Reason for Consultation:    Abdominal distention in a patient with multiple medical problems including adynamic colon during recent hospitalization.  HPI:   Patient is 70 year old Caucasian male who was alcoholic cirrhosis and unfortunately has continued to drink. He was admitted to [facility] on 08/27/2015 with alcohol withdrawal, hepatic encephalopathy as well as alcoholic hepatitis and fever. He was documented to have MRSA sepsis. I saw him in consultation for markedly distended abdomen secondary to colonic ileus. He responded to medical therapy. 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 Patient recovered and was discharged to [facility] 3 days ago. He presented to emergency room yesterday because of progressive abdominal distention and pain. He also reported nausea but he is hungry this evening. He denies vomiting fever or chills. He continues complain of back pain but states he has 42 stitches. He says his bowels have been moving. He denies melena or rectal bleeding. He has not been able to stand since his surgery. He states he has been too weak to do anything. Patient was discharged on IV vancomycin which is supposed to be continued through 11/02/2015. He had abdominopelvic CT yesterday which revealed moderate ascites. Transverse colon filled with air but rest of the colon is unremarkable.    Past Medical History  Diagnosis Date  . COPD (chronic obstructive pulmonary disease) (HCC)   . Hypertension   . Hepatic steatosis 12/14/2011  . Compression fracture of L1 lumbar vertebra (HCC) 12/14/2011  . C2 cervical fracture (HCC)   . Duodenal ulcer with hemorrhage 12/15/2011    Likely source of bleeding.per EGD; Dr. Karilyn Cota  . Esophageal varices without bleeding (HCC) 12/15/2011    per EGD  .  Cirrhosis (HCC) 12/16/2011    per ultrasound  . Hx of substance abuse 12/17/2011  . 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 Hepatic encephalopathy (HCC)   . Helicobacter pylori antibody positive 12/19/2011  . Encephalopathy 05/18/2012  . UTI (urinary tract infection) 12/13/2011  . Ascites 05/18/2012    S/p paracentesis yielding 7880 mL  . Cirrhosis (HCC)   . Depression     Past Surgical History  Procedure Laterality Date  . Coronary angioplasty with stent placement    . Thoracic laminectomy for epidural abscess N/A 09/07/2015    Procedure: THORACIC FIVE-THORACIC ELEVEN LAMINECTOMY FOR EPIDURAL ABSCESS;  Surgeon: Loura Halt Ditty, MD;  Location: [facility];  Service: Neurosurgery;  Laterality: N/A;    Prior to Admission medications   Medication Sig Start Date End Date Taking? Authorizing Provider  albuterol (PROVENTIL) (2.5 MG/3ML) 0.083% nebulizer solution Take 3 mLs (2.5 mg total) by nebulization every 6 (six) hours as needed for wheezing or shortness of breath. 06/10/15  Yes Oneta Rack, NP  carisoprodol (SOMA) 350 MG tablet Take 1 tablet (350 mg total) by mouth 3 (three) times daily as needed for muscle spasms. 09/13/15  Yes Hollice Espy, MD  diphenhydrAMINE (BENADRYL) 25 MG tablet Take 1 tablet (25 mg total) by mouth at bedtime as needed for sleep. 06/19/15  Yes Marily Memos, MD  furosemide (LASIX) 20 MG tablet Take 20 mg by mouth daily. 08/17/15  Yes Historical Provider, MD  Heparin Sodium, Porcine, (HEP-LOCK FLUSH IJ) Inject 5 mLs as directed 3 (three) times daily.   Yes Historical Provider, MD  HYDROcodone-acetaminophen (NORCO/VICODIN) 5-325 MG tablet Take 1  tablet by mouth every 6 (six) hours as needed for severe pain. 09/13/15  Yes Hollice Espy, MD  hydroxypropyl methylcellulose / hypromellose (ISOPTO TEARS / GONIOVISC) 2.5 % ophthalmic solution Place 1 drop into both eyes as needed for dry eyes. Reported on 07/22/2015   Yes  Historical Provider, MD  ibuprofen (ADVIL,MOTRIN) 400 MG tablet Take 400 mg by mouth every 6 (six) hours as needed.   Yes Historical Provider, MD  lactulose (CHRONULAC) 10 GM/15ML solution Take 15 mLs by mouth 3 (three) times daily as needed. 07/22/15  Yes Historical Provider, MD  Menthol-Methyl Salicylate (BENGAY ARTHRITIS FORMULA EX) Apply 1 application topically 3 (three) times daily as needed.   Yes Historical Provider, MD  omeprazole (PRILOSEC) 20 MG capsule Take 20 mg by mouth daily.   Yes Historical Provider, MD  sodium chloride 0.9 % injection Inject 10 mLs into the vein 3 (three) times daily.   Yes Historical Provider, MD  spironolactone (ALDACTONE) 25 MG tablet Take 1 tablet (25 mg total) by mouth daily. 07/22/15  Yes Len Blalock, NP  Vancomycin (VANCOCIN) 750 MG/150ML SOLN Inject 150 mLs (750 mg total) into the vein every 12 (twelve) hours. 09/13/15 11/02/15 Yes Hollice Espy, MD    Current Facility-Administered Medications  Medication Dose Route Frequency Provider Last Rate Last Dose  . [START ON 09/17/2015] albumin human 25 % solution 25 g  25 g Intravenous Daily Malissa Hippo, MD      . albuterol (PROVENTIL) (2.5 MG/3ML) 0.083% nebulizer solution 2.5 mg  2.5 mg Nebulization Q6H PRN Houston Siren, MD      . bisacodyl (DULCOLAX) suppository 10 mg  10 mg Rectal Daily PRN Houston Siren, MD      . ciprofloxacin (CIPRO) IVPB 400 mg  400 mg Intravenous Q12H Houston Siren, MD   400 mg at 09/16/15 0817  . dextrose 5 %-0.9 % sodium chloride infusion   Intravenous Continuous Houston Siren, MD 50 mL/hr at 09/16/15 1631    . feeding supplement (ENSURE ENLIVE) (ENSURE ENLIVE) liquid 237 mL  237 mL Oral BID BM Oval Linsey, MD   237 mL at 09/16/15 1624  . furosemide (LASIX) injection 40 mg  40 mg Intravenous BID Houston Siren, MD   40 mg at 09/16/15 1623  . lactulose (CHRONULAC) 10 GM/15ML solution 20 g  20 g Oral TID Houston Siren, MD   20 g at 09/16/15 0818  . LORazepam (ATIVAN) injection 1 mg  1 mg Intravenous Q4H  PRN Houston Siren, MD   1 mg at 09/15/15 2043  . ondansetron (ZOFRAN) tablet 4 mg  4 mg Oral Q6H PRN Houston Siren, MD       Or  . ondansetron [facility]) injection 4 mg  4 mg Intravenous Q6H PRN Houston Siren, MD   4 mg at 09/15/15 1306  . polyvinyl alcohol (LIQUIFILM TEARS) 1.4 % ophthalmic solution 1 drop  1 drop Both Eyes PRN Houston Siren, MD      . vancomycin (VANCOCIN) 1,250 mg in sodium chloride 0.9 % 250 mL IVPB  1,250 mg Intravenous Q24H Houston Siren, MD   1,250 mg at 09/16/15 1623    Allergies as of 09/15/2015  . (No Known Allergies)    History reviewed. No pertinent family history.  Social History   Social History  . Marital Status: Divorced    Spouse Name: N/A  . Number of Children: N/A  . Years of Education: N/A   Occupational History  . Not on file.   Social  History Main Topics  . Smoking status: Current Every Day Smoker -- 1.00 packs/day    Types: Cigarettes  . Smokeless tobacco: Current User     Comment: 1 1/2 pack a day  . Alcohol Use: Yes     Comment: drinking everyday.  . Drug Use: Yes    Special: Cocaine     Comment: prescription drugs/   says no cocaine in 1 month 07/13/15  . Sexual Activity: Not Currently   Other Topics Concern  . Not on file   Social History Narrative    Review of Systems: See HPI, otherwise normal ROS  Physical Exam: Temp:  [97.6 F (36.4 C)-99.4 F (37.4 C)] 97.6 F (36.4 C) (02/16 2016) Pulse Rate:  [66-84] 84 (02/16 2016) Resp:  [18] 18 (02/16 2016) BP: (110-111)/(53-96) 110/53 mmHg (02/16 2016) SpO2:  [99 %-100 %] 99 % (02/16 2016) Last BM Date: 09/15/15  Patient is alert and in no acute distress. He does not have asterixis. Conjunctiva is pale. Sclera is nonicteric. Oropharyngeal mucosa is unremarkable. No neck masses or thyromegaly noted. Heart exam with regular rhythm normal S1 and S2. No murmur or gallop noted. Auscultation of lungs revealed was entered breath sounds bilaterally. Breath sounds are diminished in both bases. Abdomen  is distended. Bowel sounds are normal. On palpation it is somewhat tense with vague. Umbilicus tenderness. Flanks are bulging and dull. Liver spleen cannot be palpated. He has 3+ pitting edema involving both legs. He is able to wiggle his toes.    Lab Results:  Recent Labs  09/15/15 0433 09/16/15 0601  WBC 10.2 9.2  HGB 8.5* 7.8*  HCT 24.9* 22.8*  PLT 171 153   BMET  Recent Labs  09/15/15 0433 09/16/15 0601  NA 130* 133*  K 4.3 4.4  CL 105 106  CO2 22 21*  GLUCOSE 105* 96  BUN 36* 41*  CREATININE 1.45* 1.83*  CALCIUM 7.4* 7.4*   LFT  Recent Labs  09/16/15 0601  PROT 5.7*  ALBUMIN 1.5*  AST 60*  ALT 28  ALKPHOS 79  BILITOT 1.9*   PT/INR  Recent Labs  09/16/15 0601  LABPROT 19.1*  INR 1.60*    Studies/Results: Ct Abdomen Pelvis Wo Contrast  09/15/2015  CLINICAL DATA:  Abdominal pain and distention following epidural abscess surgery on 09/07/2015. Additional history of cirrhosis and duodenum ulcer. EXAM: CT ABDOMEN AND PELVIS WITHOUT CONTRAST TECHNIQUE: Multidetector CT imaging of the abdomen and pelvis was performed following the standard protocol without IV contrast. COMPARISON:  CT abdomen pelvis 09/01/2015 FINDINGS: Lower chest: Atelectasis at the LEFT lung base. Hepatobiliary: The liver is contracted. There is a moderate volume of fluid within the abdomen consistent with ascites. The caudate lobe is enlarged. High-density dependent material within the gallbladder lumen consists of milk-of-calcium. No evidence acute cholecystitis. Pancreas: Pancreas is normal. No ductal dilatation. No pancreatic inflammation. Spleen: Normal spleen Adrenals/urinary tract: Adrenal glands and kidneys are normal. Ureters are normal. The retention balloon of Foley catheter is expanded in the prostatic urethra. The tip of the catheter just enters bladder. The bladder is mildly distended. Stomach/Bowel: Stomach, small bowel, appendix, and cecum are normal. The colon and rectosigmoid  colon are normal. Vascular/Lymphatic: Abdominal aorta is normal caliber with atherosclerotic calcification. There is no retroperitoneal or periportal lymphadenopathy. No pelvic lymphadenopathy. Reproductive: Foley catheter retention balloon is expanded in the prostatic urethra. Other: Moderate free fluid the abdomen pelvis. Fluid extends into a RIGHT inguinal hernia. Musculoskeletal: No aggressive osseous lesion. Postsurgical change in the  lower thoracic spine with laminectomy. No organized fluid collection identified. IMPRESSION: 1. Moderate volume ascites in the abdomen and pelvis. Morphologic changes of liver consistent cirrhosis. 2. No evidence of bowel obstruction. 3. Retention balloon of the Foley catheter is expanded in the prostatic urethra. The bladder is mildly distended. Recommend repositioning of Foley catheter. 4. A postsurgical change in the lower thoracic spine out evidence complication. 5.  Atherosclerotic calcification of the abdominal aorta. Findings conveyed toNurse JJ on 09/15/2015  at08:06. Electronically Signed   By: Genevive Bi M.D.   On: 09/15/2015 08:11   Dg Abd Acute W/chest  09/15/2015  CLINICAL DATA:  Back surgery last week. Currently it rehab dilatation. Increasing abdominal bloating for 3 days. Diffuse abdominal pain and swelling. EXAM: DG ABDOMEN ACUTE W/ 1V CHEST COMPARISON:  Abdomen 09/01/2015.  Chest 08/28/2015. FINDINGS: Right PICC line is been placed with tip over the cavoatrial junction region. Shallow inspiration with linear atelectasis in the lung bases, demonstrating improvement since previous study. Normal heart size and pulmonary vascularity. No blunting of costophrenic angles. No pneumothorax. Tortuous aorta. Skin clips over the midline consistent with recent surgery. Gaseous distention of colon and mid abdominal small bowel. No free pattern. No abnormal air-fluid levels. Changes most likely represent ileus. No radiopaque stones. Calcified phleboliths in the pelvis.  Degenerative changes in the spine. IMPRESSION: Improving atelectasis in the lung bases. Diffuse gas distention of small and large bowel most likely to represent ileus. Electronically Signed   By: Burman Nieves M.D.   On: 09/15/2015 05:28    Assessment;  Patient is 70 year old Caucasian male who has history of alcoholic cirrhosis who unfortunately has continued to drink alcohol was recently hospitalized from 08/27/2015 through 09/13/2015 for alcohol withdrawal, alcoholic hepatitis, MRSA infection complicated by epidural abscess requiring surgery. Patient was discharged from [facility] 3 days ago to nursing home for rehabilitation and now presents with progressive abdominal distention. Abdominopelvic CT reveals moderate amount of ascites which has increased significantly since prior CT of 09/01/2015. Progressive ascites is secondary to worsening hepatic function and progressive hypoalbuminemia. I do not believe he has SBP given abdominal exam and that he's afebrile. He is not a candidate for spironolactone because of elevated serum creatinine. He may need abdominal paracentesis primarily for therapeutic purposes.  Anemia secondary to acute and chronic illness.  Clinically patient does not appear to be encephalopathic.   Recommendations;  Advance diet to 2 g sodium diet. Albumin 25 g IV daily. Patient may need abdominal paracentesis depending on clinical course. Continue furosemide at current dose while monitoring renal function closely. Will monitor urine output. Will check urinary sodium.     LOS: 1 day   Pookela Sellin U  09/16/2015, 9:30 PM

## 2015-09-16 NOTE — Care Management Note (Signed)
Case Management Note  Patient Details  Name: Roberto [name] MRN: [id number] Date of Birth: 1946-04-12  Subjective/Objective:                  Pt admitted with illeus. Pt is from [facility]. Pt plans to return to SNF at DC. CSW is aware and will work with pt/family for pt to return to SNF at DC.   Action/Plan: No CM needs anticipated.   Expected Discharge Date:    09/16/2015              Expected Discharge Plan:  Skilled Nursing Facility  In-House Referral:  Clinical Social Work  Discharge planning Services  CM Consult  Post Acute Care Choice:  NA Choice offered to:  NA  DME Arranged:    DME Agency:     HH Arranged:    HH Agency:     Status of Service:  Completed, signed off  Medicare Important Message Given:    Date Medicare IM Given:    Medicare IM give by:    Date Additional Medicare IM Given:    Additional Medicare Important Message give by:     If discussed at Long Length of Stay Meetings, dates discussed:    Additional Comments:  Malcolm Metro, RN 09/16/2015, 11:29 AM

## 2015-09-16 NOTE — NC FL2 (Signed)
[place] MEDICAID FL2 LEVEL OF CARE SCREENING TOOL     IDENTIFICATION  Patient Name: Roberto [name] Birthdate: Dec 16, 1945 Sex: male Admission Date (Current Location): 09/15/2015  [facility] and [place] Number:  [employer] and Address:  [facility],  [address], [place] [address]      Provider Number: [id number]  Attending Physician Name and Address:  Oval Linsey, MD  Relative Name and Phone Number:       Current Level of Care: Hospital Recommended Level of Care: Skilled Nursing Facility Prior Approval Number:    Date Approved/Denied:   PASRR Number:    Discharge Plan: SNF    Current Diagnoses: Patient Active Problem List   Diagnosis Date Noted  . Ileus (HCC) 09/15/2015  . Abdominal pain 09/15/2015  . Cirrhosis of liver with ascites (HCC)   . MRSA infection   . Malnutrition of moderate degree 09/13/2015  . Chronic diastolic heart failure (HCC) 09/12/2015  . 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 ETOH abuse 08/27/2015  . Alcohol dependence with uncomplicated withdrawal (HCC)   . 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 Alcohol dependence (HCC) 06/02/2015  . Acute encephalopathy 09/20/2014  . Alcoholic ketoacidosis 09/20/2014  . Delirium tremens (HCC) 09/19/2014  . Hypokalemia 05/20/2012  . Acute respiratory failure (HCC) 05/19/2012  . Severe protein-calorie malnutrition (HCC) 05/19/2012  . Sepsis (HCC) 05/19/2012  . Alcohol withdrawal (HCC) 05/18/2012  . Encephalopathy 05/18/2012  . Dyspnea 05/18/2012  . Ascites 05/18/2012  . Volume overload 05/18/2012  . Helicobacter pylori antibody positive 12/19/2011  . Adynamic ileus (HCC) 12/18/2011  . 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 Hx of substance abuse 12/17/2011  . Cirrhosis (HCC) 12/16/2011  . Abdominal pain, diffuse 12/15/2011  . Thrombocytopenia (HCC) 12/15/2011  . Duodenal ulcer with hemorrhage  12/15/2011  . Esophageal varices without bleeding (HCC) 12/15/2011  . Hepatic steatosis 12/14/2011  . Compression fracture of L1 lumbar vertebra (HCC) 12/14/2011  . Hypocalcemia 12/14/2011  . Acute renal failure (HCC) 12/14/2011  . Hypernatremia 12/14/2011  . Hypotension 12/14/2011  . Hyperglycemia 12/14/2011  . Alcoholic hepatitis 12/14/2011  . GI bleed 12/13/2011  . Acute blood loss anemia 12/13/2011  . Alcohol abuse 12/13/2011  . Jaundice 12/13/2011  . Coagulopathy (HCC) 12/13/2011  . Macrocytic anemia 12/13/2011  . Renal insufficiency 12/13/2011  . Hepatic encephalopathy (HCC) 12/13/2011  . Debility 12/13/2011  . Frequent falls 12/13/2011  . Pneumonia 08/17/2011  . HTN (hypertension) 08/17/2011  . COPD (chronic obstructive pulmonary disease) (HCC) 08/17/2011  . Smoker, current status unknown 08/17/2011    Orientation RESPIRATION BLADDER Height & Weight     Self, Situation, Place  Normal Incontinent Weight: 240 lb (108.863 kg) Height:   (185.4 cm)  BEHAVIORAL SYMPTOMS/MOOD NEUROLOGICAL BOWEL NUTRITION STATUS      Continent Diet  AMBULATORY STATUS COMMUNICATION OF NEEDS Skin   Extensive Assist   Normal                       Personal Care Assistance Level of Assistance  Bathing, Dressing           Functional Limitations Info             SPECIAL CARE FACTORS FREQUENCY  PT (By licensed PT), OT (By licensed OT)                    Contractures      Additional Factors Info  Isolation Precautions               Current Medications (09/16/2015):  This is the current hospital active medication list Current Facility-Administered Medications  Medication Dose Route Frequency Provider Last Rate Last Dose  . albuterol (PROVENTIL) (2.5 MG/3ML) 0.083% nebulizer solution 2.5 mg  2.5 mg Nebulization Q6H PRN Houston Siren, MD      . bisacodyl (DULCOLAX) suppository 10 mg  10 mg Rectal Daily PRN Houston Siren, MD      . ciprofloxacin (CIPRO) IVPB 400 mg  400  mg Intravenous Q12H Houston Siren, MD   400 mg at 09/16/15 0817  . dextrose 5 %-0.9 % sodium chloride infusion   Intravenous Continuous Houston Siren, MD 50 mL/hr at 09/15/15 1129    . furosemide (LASIX) injection 40 mg  40 mg Intravenous BID Houston Siren, MD   40 mg at 09/16/15 0819  . lactulose (CHRONULAC) 10 GM/15ML solution 20 g  20 g Oral TID Houston Siren, MD   20 g at 09/16/15 0818  . LORazepam (ATIVAN) injection 1 mg  1 mg Intravenous Q4H PRN Houston Siren, MD   1 mg at 09/15/15 2043  . ondansetron (ZOFRAN) tablet 4 mg  4 mg Oral Q6H PRN Houston Siren, MD       Or  . ondansetron [facility]) injection 4 mg  4 mg Intravenous Q6H PRN Houston Siren, MD   4 mg at 09/15/15 1306  . polyvinyl alcohol (LIQUIFILM TEARS) 1.4 % ophthalmic solution 1 drop  1 drop Both Eyes PRN Houston Siren, MD      . vancomycin (VANCOCIN) 1,250 mg in sodium chloride 0.9 % 250 mL IVPB  1,250 mg Intravenous Q24H Houston Siren, MD   1,250 mg at 09/15/15 1823     Discharge Medications: Please see discharge summary for a list of discharge medications.  Relevant Imaging Results:  Relevant Lab Results:   Additional Information    Liliana Cline, LCSW

## 2015-09-17 ENCOUNTER — Inpatient Hospital Stay (HOSPITAL_COMMUNITY): Payer: Medicare Other

## 2015-09-17 LAB — BASIC METABOLIC PANEL
Anion gap: 4 — ABNORMAL LOW (ref 5–15)
BUN: 41 mg/dL — AB (ref 6–20)
CO2: 22 mmol/L (ref 22–32)
CREATININE: 1.87 mg/dL — AB (ref 0.61–1.24)
Calcium: 7.3 mg/dL — ABNORMAL LOW (ref 8.9–10.3)
Chloride: 105 mmol/L (ref 101–111)
GFR calc Af Amer: 41 mL/min — ABNORMAL LOW (ref >60)
GFR, EST NON AFRICAN AMERICAN: 36 mL/min — AB (ref >60)
GLUCOSE: 114 mg/dL — AB (ref 65–99)
Potassium: 4 mmol/L (ref 3.5–5.1)
SODIUM: 131 mmol/L — AB (ref 135–145)

## 2015-09-17 LAB — VANCOMYCIN, TROUGH: VANCOMYCIN TR: 21 ug/mL — AB (ref 10.0–20.0)

## 2015-09-17 MED ORDER — VANCOMYCIN HCL IN DEXTROSE 1-5 GM/200ML-% IV SOLN
1000.0000 mg | INTRAVENOUS | Status: DC
  Administered 2015-09-19 – 2015-09-22 (×4): 1000 mg via INTRAVENOUS
  Filled 2015-09-17 (×5): qty 200

## 2015-09-17 MED ORDER — OXYCODONE HCL 5 MG PO TABS
5.0000 mg | ORAL_TABLET | Freq: Three times a day (TID) | ORAL | Status: DC | PRN
  Administered 2015-09-17 – 2015-09-30 (×29): 5 mg via ORAL
  Filled 2015-09-17 (×31): qty 1

## 2015-09-17 MED ORDER — MAGNESIUM OXIDE 400 (241.3 MG) MG PO TABS
400.0000 mg | ORAL_TABLET | Freq: Two times a day (BID) | ORAL | Status: DC
  Administered 2015-09-17 – 2015-09-30 (×27): 400 mg via ORAL
  Filled 2015-09-17 (×27): qty 1

## 2015-09-17 MED ORDER — PANTOPRAZOLE SODIUM 40 MG PO TBEC
40.0000 mg | DELAYED_RELEASE_TABLET | Freq: Every day | ORAL | Status: DC
  Administered 2015-09-17 – 2015-09-26 (×10): 40 mg via ORAL
  Filled 2015-09-17 (×10): qty 1

## 2015-09-17 NOTE — Progress Notes (Signed)
Ultrasound findings reviewed with Dr. Ulyses Southward earlier today. He felt ascites and decreased since he had abdominopelvic CT 2 days ago. Therefore abdominal paracenteses not performed. I did not feel that he needed to undergo tap just for diagnostic purposes. Will need to monitor renal function and electrolytes closely while patient is on loop diuretic. Would not recommend spironolactone because of significant risk of hyperkalemia given impaired renal function. Patient begun on pantoprazole 40 mg by mouth daily for GERD.  Dr. Darrick Penna will be covering patient over the weekend.

## 2015-09-17 NOTE — Progress Notes (Signed)
Attempted to apply a condom cath on patient, but the patient was too swollen for the condom cath to fit and stay. Still attempting to get urine sample on patient.

## 2015-09-17 NOTE — Progress Notes (Signed)
Patient continues to complain of abdominal distention and abdominal pain  Reveals some evidence of ascites tolerating clear liquids and then care twice a day her lites within normal limits Roberto [name] ZOX:[id number] DOB: Jul 23, 1946 DOA: 09/15/2015 PCP: Isabella Stalling, MD             Physical Exam: Blood pressure 132/65, pulse 85, temperature 98.4 F (36.9 C), temperature source Oral, resp. rate 18, height  (1.854 m), weight 240 lb (108.863 kg), SpO2 95 %. lungs diminished breath sounds in bases no rales wheeze rhonchi appreciable heart regular rhythm no S3 or S4 no heaves rubs abdomen soft no peristaltic rushes no guarding or rebound no detectable organomegaly   Investigations:  Recent Results (from the past 240 hour(s))  MRSA PCR Screening     Status: Abnormal   Collection Time: 09/07/15  6:56 PM  Result Value Ref Range Status   MRSA by PCR POSITIVE (A) NEGATIVE Final    Comment:        The GeneXpert MRSA Assay (FDA approved for NASAL specimens only), is one component of a comprehensive MRSA colonization surveillance program. It is not intended to diagnose MRSA infection nor to guide or monitor treatment for MRSA infections. RESULT CALLED TO, READ BACK BY AND VERIFIED WITH: M MASON@[email or web address]  09/06/15 MKELLY   Anaerobic culture     Status: None   Collection Time: 09/07/15  8:39 PM  Result Value Ref Range Status   Specimen Description ABSCESS  Final   Special Requests EPIDURAL ABSCESS  Final   Gram Stain   Final    NO WBC SEEN NO SQUAMOUS EPITHELIAL CELLS SEEN NO ORGANISMS SEEN Performed at [facility]    Culture   Final    NO ANAEROBES ISOLATED Performed at [facility]    Report Status 09/12/2015 FINAL  Final  Culture, routine-abscess     Status: None   Collection Time: 09/07/15  8:39 PM  Result Value Ref Range Status   Specimen Description ABSCESS  Final   Special Requests EPIDURAL ABSCESS  Final   Gram Stain   Final    NO WBC  SEEN NO SQUAMOUS EPITHELIAL CELLS SEEN NO ORGANISMS SEEN Performed at [facility]    Culture   Final    NO GROWTH 2 DAYS Performed at [facility]    Report Status 09/10/2015 FINAL  Final  Anaerobic culture     Status: None   Collection Time: 09/07/15  9:20 PM  Result Value Ref Range Status   Specimen Description ABSCESS  Final   Special Requests THORACIC EPIDURAL  Final   Gram Stain   Final    FEW WBC PRESENT, PREDOMINANTLY PMN NO SQUAMOUS EPITHELIAL CELLS SEEN NO ORGANISMS SEEN Performed at [facility]    Culture   Final    NO ANAEROBES ISOLATED Performed at [facility]    Report Status 09/12/2015 FINAL  Final  Culture, routine-abscess     Status: None   Collection Time: 09/07/15  9:20 PM  Result Value Ref Range Status   Specimen Description ABSCESS  Final   Special Requests THORACIC EPIDURAL  Final   Gram Stain   Final    FEW WBC PRESENT, PREDOMINANTLY PMN NO SQUAMOUS EPITHELIAL CELLS SEEN NO ORGANISMS SEEN Performed at [facility]    Culture   Final    RARE METHICILLIN RESISTANT STAPHYLOCOCCUS AUREUS Note: RIFAMPIN AND GENTAMICIN SHOULD NOT BE USED AS SINGLE DRUGS FOR TREATMENT OF STAPH INFECTIONS. CRITICAL RESULT  CALLED TO, READ BACK BY AND VERIFIED WITH: FAITH H. AT 9:38AM ON 09/11/2015 HAJAM Performed at [facility]    Report Status 09/11/2015 FINAL  Final   Organism ID, Bacteria METHICILLIN RESISTANT STAPHYLOCOCCUS AUREUS  Final      Susceptibility   Methicillin resistant staphylococcus aureus - MIC*    CLINDAMYCIN <=0.25 SENSITIVE Sensitive     ERYTHROMYCIN <=0.25 SENSITIVE Sensitive     GENTAMICIN <=0.5 SENSITIVE Sensitive     LEVOFLOXACIN >=8 RESISTANT Resistant     OXACILLIN >=4 RESISTANT Resistant     RIFAMPIN <=0.5 SENSITIVE Sensitive     TRIMETH/SULFA >=320 RESISTANT Resistant     VANCOMYCIN 1 SENSITIVE Sensitive     TETRACYCLINE <=1 SENSITIVE Sensitive     * RARE METHICILLIN RESISTANT  STAPHYLOCOCCUS AUREUS     Basic Metabolic Panel:  Recent Labs  43/15/40 0601 09/17/15 0639  NA 133* 131*  K 4.4 4.0  CL 106 105  CO2 21* 22  GLUCOSE 96 114*  BUN 41* 41*  CREATININE 1.83* 1.87*  CALCIUM 7.4* 7.3*  MG 1.8  --    Liver Function Tests:  Recent Labs  09/15/15 0433 09/16/15 0601  AST 76* 60*  ALT 33 28  ALKPHOS 88 79  BILITOT 1.5* 1.9*  PROT 6.2* 5.7*  ALBUMIN 1.6* 1.5*     CBC:  Recent Labs  09/15/15 0433 09/16/15 0601  WBC 10.2 9.2  NEUTROABS 6.6  --   HGB 8.5* 7.8*  HCT 24.9* 22.8*  MCV 98.8 100.4*  PLT 171 153    US Abdomen Limited  09/17/2015  CLINICAL DATA:  Ascites, cirrhosis, COPD, hypertension, MRSA, current epidural abscess post surgery EXAM: LIMITED ABDOMEN ULTRASOUND FOR ASCITES TECHNIQUE: Limited ultrasound survey for ascites was performed in all four abdominal quadrants. COMPARISON:  CT abdomen and pelvis 09/15/2015 FINDINGS: Small volumes of ascites are seen in the flanks bilaterally. Fluid volume is substantially less than expected from the prior CT exam likely due to interval mobilization of fluid and diuresis. Fluid is scattered from the flanks to perihepatic region and pelvis. Volume of fluid visualized is subjectively insufficient for expected significant therapeutic benefit. IMPRESSION: Scattered small amounts of ascites, substantially less than expected from prior CT exam, likely due to interval fluid mobilization and diuresis. Electronically Signed   By: Ulyses Southward M.D.   On: 09/17/2015 12:03      Medications:   Impression: MRSA septicemia currently undergoing 8 weeks therapy with vancomycin Active Problems:   HTN (hypertension)   Renal insufficiency   Debility   Cirrhosis (HCC)   Hx of substance abuse   Adynamic ileus (HCC)   Ascites   Epidural abscess   Chronic diastolic heart failure (HCC)   Ileus (HCC)   Abdominal pain     Plan: Continue clear liquids continue and her monitor electrolytes protonic 40 by  mouth twice a day continue vancomycin 750 every 12  Consultants:    Procedures   Antibiotics:                   Code Status:   Family Communication:    Disposition Plan see plan above  Time spent: 30 minutes   LOS: 2 days   Taeko Schaffer M   09/17/2015, 12:17 PM

## 2015-09-17 NOTE — Progress Notes (Signed)
Pharmacy Antibiotic Note  Roberto [name] is a 70 y.o. male admitted on 09/15/2015 with bacteremia.  Pharmacy has been consulted for vancomycin dosing. He was on vancomycin PTA with a trough of 23 on 2/13 on a dose of 750 mg IV q12 hr. Vancomycin trough 21 today 2/17 on Vancomycin 1250 mg IV every 24 hours. SCR 1.87.  Plan: Change Vancomycin to 1 GM IV every 24 hours starting tomorrow 6 AM to allow Vancomycin level to decrease Vancomycin trough at steady state F/u renal function and clinical course   Height:  (185.4 cm) Weight: 240 lb (108.863 kg) IBW/kg (Calculated) : 79.9  Temp (24hrs), Avg:98.1 F (36.7 C), Min:97.6 F (36.4 C), Max:98.4 F (36.9 C)   Recent Labs Lab 09/11/15 0525  09/12/15 0212 09/12/15 0213 09/13/15 0447 09/13/15 1215 09/15/15 0433 09/16/15 0601 09/17/15 0639 09/17/15 1821  WBC 8.3  --   --  11.0*  --   --  10.2 9.2  --   --   CREATININE 1.30*  --  1.43*  --  1.43*  --  1.45* 1.83* 1.87*  --   LATICACIDVEN  --   --   --   --   --   --  1.0  --   --   --   VANCOTROUGH  --   < >  --   --   --  23*  --   --   --  21*  < > = values in this interval not displayed.  Estimated Creatinine Clearance: 49.6 mL/min (by C-G formula based on Cr of 1.87).    No Known Allergies  Antimicrobials this admission: vanc 1/29 >> 2/2, 2/8 >> Zosyn 1/29 >> 2/1 ceftaroline 2/2>> 2/8  Dose adjustments this admission: 2/1 VT 26 on 1 gm q12, dose changed to 1250 q24 2/13 VT 23 on 750 mg Q12H SCr 1.43 2/17 VT 21 on 1250 mg every 24 hours SCR 1.87  Thank you for allowing pharmacy to be a part of this patient's care.  Josephine Igo 09/17/2015 7:59 PM

## 2015-09-17 NOTE — Care Management Important Message (Signed)
Important Message  Patient Details  Name: Roberto [name] MRN: [id number] Date of Birth: 12/09/1945   Medicare Important Message Given:  Yes    Adonis Huguenin, RN 09/17/2015, 4:34 PM

## 2015-09-17 NOTE — Progress Notes (Signed)
Patient continues to complain of abdominal pain and distention. Patient also complains of heartburn but denies nausea or vomiting. Patient is alert and does not have asterixis. Abdomen is distended and tense. Mild tenderness in epigastric region. LE edema is unchanged. Patient's gown in bed is wet. He is incontinent of urine. Serum sodium 131, serum potassium 4.0, chloride 105, CO2 22, BUN 41 and creatinine 1.87.  Recommendations:  Will place condom catheter> Ultrasound-guided abdominal presence sees both for diagnostic and therapeutic purposes. Metabolic 7 in a.m. Pantoprazole 40 mg by mouth daily.

## 2015-09-18 DIAGNOSIS — R188 Other ascites: Secondary | ICD-10-CM

## 2015-09-18 DIAGNOSIS — K7031 Alcoholic cirrhosis of liver with ascites: Secondary | ICD-10-CM

## 2015-09-18 LAB — BASIC METABOLIC PANEL
Anion gap: 5 (ref 5–15)
BUN: 36 mg/dL — AB (ref 6–20)
CHLORIDE: 106 mmol/L (ref 101–111)
CO2: 22 mmol/L (ref 22–32)
Calcium: 7.3 mg/dL — ABNORMAL LOW (ref 8.9–10.3)
Creatinine, Ser: 1.75 mg/dL — ABNORMAL HIGH (ref 0.61–1.24)
GFR calc Af Amer: 45 mL/min — ABNORMAL LOW (ref >60)
GFR calc non Af Amer: 39 mL/min — ABNORMAL LOW (ref >60)
GLUCOSE: 102 mg/dL — AB (ref 65–99)
POTASSIUM: 3.8 mmol/L (ref 3.5–5.1)
Sodium: 133 mmol/L — ABNORMAL LOW (ref 135–145)

## 2015-09-18 MED ORDER — CITALOPRAM HYDROBROMIDE 20 MG PO TABS
20.0000 mg | ORAL_TABLET | Freq: Every day | ORAL | Status: DC
  Administered 2015-09-18 – 2015-09-30 (×13): 20 mg via ORAL
  Filled 2015-09-18 (×13): qty 1

## 2015-09-18 MED ORDER — ALBUMIN HUMAN 25 % IV SOLN
25.0000 g | Freq: Two times a day (BID) | INTRAVENOUS | Status: DC
  Administered 2015-09-18 – 2015-09-20 (×4): 25 g via INTRAVENOUS
  Filled 2015-09-18 (×6): qty 100

## 2015-09-18 MED ORDER — OXYCODONE HCL 5 MG PO TABS
5.0000 mg | ORAL_TABLET | Freq: Three times a day (TID) | ORAL | Status: DC
  Administered 2015-09-18 – 2015-09-30 (×50): 5 mg via ORAL
  Filled 2015-09-18 (×49): qty 1

## 2015-09-18 NOTE — Progress Notes (Signed)
Patient ID: Roberto [name], male   DOB: 11-12-1945, 70 y.o.   MRN: [id number]   Assessment/Plan: ADMITTED WITH ANASARCA/ASCITES. CLINICALLY STABLE. PAIN NOT IDEALLY CONTROLLED.  PLAN: 1. OXYCODONE TID WITH MEALS AND AT BEDTIME. OXYCODONE Q8H PRN  2. INCREASE ALBUMIN TO BID WITH LASIX BID 3. CONTINUE TO MONITOR SYMPTOMS.    Subjective: Since YESTERDAY PT C/O PAIN WHEN HE HAS TO MOVE TO BE CLEANED UP. PAIN NOT CONTROLLED. GETTING DEPRESSED. STILL HAS SWELLING IN LEGS AND ABDOMEN.  Objective: Vital signs in last 24 hours: Filed Vitals:   09/17/15 1430 09/17/15 2133  BP: 124/64 116/60  Pulse: 84 89  Temp: 98.3 F (36.8 C) 98.4 F (36.9 C)  Resp: 18 18   General appearance: alert, cooperative and mild distress Resp: clear to auscultation bilaterally Cardio: irregularly irregular rhythm GI: soft, non-tender; DISTENDED, bowel sounds normal;  Extremities: edema 4+ BILATERAL LOWER EXTERMITY  Lab Results:   Cr 1.75 K 3.8  Studies/Results: US Abdomen Limited  09/17/2015  CLINICAL DATA:  Ascites, cirrhosis, COPD, hypertension, MRSA, current epidural abscess post surgery EXAM: LIMITED ABDOMEN ULTRASOUND FOR ASCITES TECHNIQUE: Limited ultrasound survey for ascites was performed in all four abdominal quadrants. COMPARISON:  CT abdomen and pelvis 09/15/2015 FINDINGS: Small volumes of ascites are seen in the flanks bilaterally. Fluid volume is substantially less than expected from the prior CT exam likely due to interval mobilization of fluid and diuresis. Fluid is scattered from the flanks to perihepatic region and pelvis. Volume of fluid visualized is subjectively insufficient for expected significant therapeutic benefit. IMPRESSION: Scattered small amounts of ascites, substantially less than expected from prior CT exam, likely due to interval fluid mobilization and diuresis. Electronically Signed   By: Ulyses Southward M.D.   On: 09/17/2015 12:03    Medications: I have reviewed the patient's  current medications.   LOS: 5 days   Jonette Eva 01/08/2014, 2:23 PM

## 2015-09-18 NOTE — Progress Notes (Signed)
Patient reports sleeping well less somatic complaints last night and bowel movements has less cramping of his hands after administration of oral magnesium tablet yesterday [name]:[id number] DOB: 1946-01-22 DOA: 09/15/2015 PCP: Isabella Stalling, MD             Physical Exam: Blood pressure 116/60, pulse 89, temperature 98.4 F (36.9 C), temperature source Oral, resp. rate 18, height  (1.854 m), weight 240 lb (108.863 kg), SpO2 99 %. lungs diminished breath sounds to bases no rales wheeze rhonchi heart regular rhythm no murmurs gallops or rubs abdomen protuberant bowel sounds normoactive no guarding or rebound or masses no megaly   Investigations:  No results found for this or any previous visit (from the past 240 hour(s)).   Basic Metabolic Panel:  Recent Labs  81/19/14 0601 09/17/15 0639  NA 133* 131*  K 4.4 4.0  CL 106 105  CO2 21* 22  GLUCOSE 96 114*  BUN 41* 41*  CREATININE 1.83* 1.87*  CALCIUM 7.4* 7.3*  MG 1.8  --    Liver Function Tests:  Recent Labs  09/16/15 0601  AST 60*  ALT 28  ALKPHOS 79  BILITOT 1.9*  PROT 5.7*  ALBUMIN 1.5*     CBC:  Recent Labs  09/16/15 0601  WBC 9.2  HGB 7.8*  HCT 22.8*  MCV 100.4*  PLT 153    US Abdomen Limited  09/17/2015  CLINICAL DATA:  Ascites, cirrhosis, COPD, hypertension, MRSA, current epidural abscess post surgery EXAM: LIMITED ABDOMEN ULTRASOUND FOR ASCITES TECHNIQUE: Limited ultrasound survey for ascites was performed in all four abdominal quadrants. COMPARISON:  CT abdomen and pelvis 09/15/2015 FINDINGS: Small volumes of ascites are seen in the flanks bilaterally. Fluid volume is substantially less than expected from the prior CT exam likely due to interval mobilization of fluid and diuresis. Fluid is scattered from the flanks to perihepatic region and pelvis. Volume of fluid visualized is subjectively insufficient for expected significant therapeutic benefit. IMPRESSION: Scattered small  amounts of ascites, substantially less than expected from prior CT exam, likely due to interval fluid mobilization and diuresis. Electronically Signed   By: Ulyses Southward M.D.   On: 09/17/2015 12:03      Medications:   Impression:  Active Problems:   HTN (hypertension)   Renal insufficiency   Debility   Cirrhosis (HCC)   Hx of substance abuse   Adynamic ileus (HCC)   Ascites   Epidural abscess   Chronic diastolic heart failure (HCC)   Ileus (HCC)   Abdominal pain     Plan: Continue Protonix orally continue magnesium oxide 400 mg by mouth daily for cramps will add Celexa for presumed irritability and depression  Consultants: Gastroenterology   Procedures   Antibiotics: Cipro                  Code Status: Full  Family Communication:  Patient at length  Disposition Plan see plan above  Time spent: 30 minutes   LOS: 3 days   Roberto [name] M   09/18/2015, 7:19 AM

## 2015-09-19 LAB — BASIC METABOLIC PANEL
ANION GAP: 6 (ref 5–15)
BUN: 42 mg/dL — AB (ref 6–20)
CO2: 21 mmol/L — AB (ref 22–32)
Calcium: 7.4 mg/dL — ABNORMAL LOW (ref 8.9–10.3)
Chloride: 105 mmol/L (ref 101–111)
Creatinine, Ser: 2.09 mg/dL — ABNORMAL HIGH (ref 0.61–1.24)
GFR calc Af Amer: 36 mL/min — ABNORMAL LOW (ref >60)
GFR calc non Af Amer: 31 mL/min — ABNORMAL LOW (ref >60)
GLUCOSE: 105 mg/dL — AB (ref 65–99)
POTASSIUM: 4.1 mmol/L (ref 3.5–5.1)
Sodium: 132 mmol/L — ABNORMAL LOW (ref 135–145)

## 2015-09-19 NOTE — Progress Notes (Signed)
Lasix increased to 40 twice a day renal function somewhat worse 2.09 today on vancomycin she denies oxycodone 10 mg 4 times a day ordered concerned about ileus from opioid effect Roberto [name] WUX:[id number] DOB: May 06, 1946 DOA: 09/15/2015 PCP: Maricela Curet, MD             Physical Exam: Blood pressure 102/61, pulse 80, temperature 98.3 F (36.8 C), temperature source Oral, resp. rate 16, height '6\' 1"'$  (1.854 m), weight 240 lb (108.863 kg), SpO2 98 %. lungs diminished breath sounds in bases no rales wheeze rhonchi appreciable heart rhythm no murmurs gallops he feels rubs abdomen soft nontender bowel sounds normoactive   Investigations:  No results found for this or any previous visit (from the past 240 hour(s)).   Basic Metabolic Panel:  Recent Labs  09/18/15 0711 09/19/15 0652  NA 133* 132*  K 3.8 4.1  CL 106 105  CO2 22 21*  GLUCOSE 102* 105*  BUN 36* 42*  CREATININE 1.75* 2.09*  CALCIUM 7.3* 7.4*   Liver Function Tests: No results for input(s): AST, ALT, ALKPHOS, BILITOT, PROT, ALBUMIN in the last 72 hours.   CBC: No results for input(s): WBC, NEUTROABS, HGB, HCT, MCV, PLT in the last 72 hours.  US Abdomen Limited  09/17/2015  CLINICAL DATA:  Ascites, cirrhosis, COPD, hypertension, MRSA, current epidural abscess post surgery EXAM: LIMITED ABDOMEN ULTRASOUND FOR ASCITES TECHNIQUE: Limited ultrasound survey for ascites was performed in all four abdominal quadrants. COMPARISON:  CT abdomen and pelvis 09/15/2015 FINDINGS: Small volumes of ascites are seen in the flanks bilaterally. Fluid volume is substantially less than expected from the prior CT exam likely due to interval mobilization of fluid and diuresis. Fluid is scattered from the flanks to perihepatic region and pelvis. Volume of fluid visualized is subjectively insufficient for expected significant therapeutic benefit. IMPRESSION: Scattered small amounts of ascites, substantially less than expected from  prior CT exam, likely due to interval fluid mobilization and diuresis. Electronically Signed   By: Lavonia Dana M.D.   On: 09/17/2015 12:03      Medications:   Impression:  Active Problems:   HTN (hypertension)   Renal insufficiency   Debility   Cirrhosis (HCC)   Hx of substance abuse   Adynamic ileus (HCC)   Ascites   Epidural abscess   Chronic diastolic heart failure (HCC)   Ileus (HCC)   Abdominal pain     Plan: Check be met daily for electrolytes and renal function and face of diuretics as well is vancomycin  Consultants: Gastroenterology   Procedures   Antibiotics: Vancomycin for 8 weeks                  Code Status: Full  Family Communication:    Disposition Plan see plan above  Time spent: 30 minutes   LOS: 4 days   [name]   09/19/2015, 11:21 AM

## 2015-09-19 NOTE — Progress Notes (Signed)
Patient ID: Roberto [name], male   DOB: 07/05/1946, 70 y.o.   MRN: [id number]  Assessment/Plan: Admitted with ANASARCA/ASCITES. CONTINUES TO HAVE ABDOMINAL PAIN WHEN HE MOVES AROUND AND ANASARCA. Cr up slightly today.  Plan: 1. MONITOR Cr 2. CONTINUE TO MONITOR SYMPTOMS: BOWEL SOUNDS/ABDOMINAL DISTENTION/LEG EDEMA   Subjective: Since I last evaluated the patient HIS PAIN IS MORE WELL CONTROLLED. FLUID IN LEGS SLIGHTLY BETTER.  Objective: Vital signs in last 24 hours: Filed Vitals:   09/18/15 2200 09/19/15 0622  BP: 106/62 102/61  Pulse: 85 80  Temp: 98.4 F (36.9 C) 98.3 F (36.8 C)  Resp: 16 16     General appearance: alert, cooperative and no distress Resp: clear to auscultation bilaterally Cardio: regular rate and rhythm GI: soft, non-tender; bowel sounds normal; no masses,  no organomegaly Extremities: edema 4+ BILATERAL LEGS  Lab Results:  Cr 2.0 K 4.1   Studies/Results: No results found.  Medications: I have reviewed the patient's current medications.   LOS: 5 days   Jonette Eva 01/08/2014, 2:23 PM

## 2015-09-20 ENCOUNTER — Telehealth (INDEPENDENT_AMBULATORY_CARE_PROVIDER_SITE_OTHER): Payer: Self-pay | Admitting: Internal Medicine

## 2015-09-20 DIAGNOSIS — R109 Unspecified abdominal pain: Secondary | ICD-10-CM

## 2015-09-20 LAB — BASIC METABOLIC PANEL
Anion gap: 8 (ref 5–15)
BUN: 44 mg/dL — AB (ref 6–20)
CHLORIDE: 104 mmol/L (ref 101–111)
CO2: 21 mmol/L — ABNORMAL LOW (ref 22–32)
CREATININE: 2.31 mg/dL — AB (ref 0.61–1.24)
Calcium: 7.7 mg/dL — ABNORMAL LOW (ref 8.9–10.3)
GFR calc Af Amer: 32 mL/min — ABNORMAL LOW (ref >60)
GFR calc non Af Amer: 28 mL/min — ABNORMAL LOW (ref >60)
GLUCOSE: 107 mg/dL — AB (ref 65–99)
POTASSIUM: 3.6 mmol/L (ref 3.5–5.1)
Sodium: 133 mmol/L — ABNORMAL LOW (ref 135–145)

## 2015-09-20 LAB — HEPATIC FUNCTION PANEL
ALT: 31 U/L (ref 17–63)
AST: 66 U/L — ABNORMAL HIGH (ref 15–41)
Albumin: 2.3 g/dL — ABNORMAL LOW (ref 3.5–5.0)
Alkaline Phosphatase: 72 U/L (ref 38–126)
BILIRUBIN DIRECT: 0.4 mg/dL (ref 0.1–0.5)
BILIRUBIN INDIRECT: 0.6 mg/dL (ref 0.3–0.9)
TOTAL PROTEIN: 5.9 g/dL — AB (ref 6.5–8.1)
Total Bilirubin: 1 mg/dL (ref 0.3–1.2)

## 2015-09-20 LAB — PROTIME-INR
INR: 1.67 — AB (ref 0.00–1.49)
PROTHROMBIN TIME: 19.7 s — AB (ref 11.6–15.2)

## 2015-09-20 LAB — VANCOMYCIN, TROUGH: VANCOMYCIN TR: 18 ug/mL (ref 10.0–20.0)

## 2015-09-20 MED ORDER — RIFAXIMIN 550 MG PO TABS
550.0000 mg | ORAL_TABLET | Freq: Two times a day (BID) | ORAL | Status: DC
  Administered 2015-09-20 – 2015-09-30 (×21): 550 mg via ORAL
  Filled 2015-09-20 (×21): qty 1

## 2015-09-20 MED ORDER — LACTULOSE 10 GM/15ML PO SOLN: 20.0000 g | Freq: Three times a day (TID) | ORAL | Status: DC | PRN

## 2015-09-20 MED ORDER — FUROSEMIDE 20 MG PO TABS
20.0000 mg | ORAL_TABLET | Freq: Every day | ORAL | Status: DC
  Administered 2015-09-21: 20 mg via ORAL
  Filled 2015-09-20: qty 1

## 2015-09-20 NOTE — Progress Notes (Signed)
    Subjective: Complains of left-sided discomfort, unchanged. No vomiting. +flatus, +BMs.   Objective: Vital signs in last 24 hours: Temp:  [98.1 F (36.7 C)-98.5 F (36.9 C)] 98.1 F (36.7 C) (02/20 0445) Pulse Rate:  [73-85] 73 (02/20 0445) Resp:  [18] 18 (02/20 0445) BP: (94-101)/(45-50) 94/45 mmHg (02/20 0445) SpO2:  [96 %-97 %] 96 % (02/20 0445) Weight:  [269 lb 6.4 oz (122.2 kg)] 269 lb 6.4 oz (122.2 kg) (02/20 0445) Last BM Date: 09/20/15 General:   Alert and oriented, pleasant Abdomen:  Protuberant, anasarca, moderate TTP left-abdomen. No rebound or guarding. +BS.  Extremities:  4+ pitting lower extremity edema bilaterally  Psych:  Alert and cooperative. Normal mood and affect.  Intake/Output from previous day: 02/19 0701 - 02/20 0700 In: 480 [P.O.:480] Out: -  Intake/Output this shift: Total I/O In: 500 [IV Piggyback:500] Out: -   BMET  Recent Labs  09/18/15 0711 09/19/15 0652 09/20/15 0546  NA 133* 132* 133*  K 3.8 4.1 3.6  CL 106 105 104  CO2 22 21* 21*  GLUCOSE 102* 105* 107*  BUN 36* 42* 44*  CREATININE 1.75* 2.09* 2.31*  CALCIUM 7.3* 7.4* 7.7*      Assessment: 70 year old male with history of ETOH cirrhosis, continued ETOH abuse, with ascites, anasarca, anemia. Worsening renal function noted. Discussed with attending, Dr. Janna Arch, who will be adjusting diuretic therapy and requesting nephrology consultation. Although abdomen is significantly protuberant and distended, patient notes this is similar to what it has been since admission. US abdomen with limited amount of ascites. Appears to have severe anasarca. Hold albumin for now until further assessment by nephrology. Lasix has been decreased to 20 mg daily per attending. Prognosis poor overall.    Plan: Check HFP, INR to calculate MELD Agree with nephrology consultation Lasix decreased per attending from 40 mg BID to 20 mg daily, next dose tomorrow Discontinue albumin for now Continue to  monitor symptoms, consider repeat ultrasound if concern for ascites but appears to have significant anasarca and remains similar to admission Dr. Karilyn Cota to follow tomorrow  .anan   LOS: 5 days    09/20/2015, 12:43 PM

## 2015-09-20 NOTE — Progress Notes (Signed)
Patient has worsening renal failure with increased diuresis as well as vancomycin will decrease diuresis and obtain renal consult ascites is protruberant. ERSKINE STEINFELDT ZOX:[id number] DOB: 1946/06/16 DOA: 09/15/2015 PCP: Isabella Stalling, MD             Physical Exam: Blood pressure 94/45, pulse 73, temperature 98.1 F (36.7 C), temperature source Oral, resp. rate 18, height  (1.854 m), weight 269 lb 6.4 oz (122.2 kg), SpO2 96 %. lungs clear diminished breath sounds in the bases no rales no wheezes abdomen Medusae present protuberant bowel sounds normoactive had 2 bowel movements today no guarding or rebound   Investigations:  No results found for this or any previous visit (from the past 240 hour(s)).   Basic Metabolic Panel:  Recent Labs  81/19/14 0652 09/20/15 0546  NA 132* 133*  K 4.1 3.6  CL 105 104  CO2 21* 21*  GLUCOSE 105* 107*  BUN 42* 44*  CREATININE 2.09* 2.31*  CALCIUM 7.4* 7.7*   Liver Function Tests: No results for input(s): AST, ALT, ALKPHOS, BILITOT, PROT, ALBUMIN in the last 72 hours.   CBC: No results for input(s): WBC, NEUTROABS, HGB, HCT, MCV, PLT in the last 72 hours.  No results found.    Medications:   Impression:  Active Problems:   HTN (hypertension)   Renal insufficiency   Debility   Cirrhosis (HCC)   Hx of substance abuse   Adynamic ileus (HCC)   Ascites   Epidural abscess   Chronic diastolic heart failure (HCC)   Ileus (HCC)   Abdominal pain     Plan: Decrease diuresis from Lasix 40 twice a day Lasix 20 daily to renal function obtain renal consult . After long discuss with radiology and they did not feel that was sufficient ascites 2 warrant a tap  Consultants: Nephrology and gastroenterology   Procedures   Antibiotics: Vancomycin                  Code Status: Full  Family Communication:    Disposition Plan see plan above  Time spent: 30 minutes   LOS: 5 days   Chasitty Hehl  M   09/20/2015, 1:20 PM

## 2015-09-20 NOTE — Progress Notes (Addendum)
No change in current d/c plan. Will return to [facility] when medically stable per MD.  Lupita Leash T. Jaci Lazier, LCSW [phone] (coverage)

## 2015-09-20 NOTE — Progress Notes (Signed)
Pharmacy Antibiotic Note  Roberto [name] is a 70 y.o. male admitted on 09/15/2015 with MRSA bacteremia. Planned Vancomycin through 11/02/2015 for spinal abscess. Pharmacy has been consulted for vancomycin dosing. He was on vancomycin PTA with a trough of 23 on 2/13 on a dose of 750 mg IV q12 hr. Vancomycin trough 18 today 2/20 on Vancomycin 1000 mg IV every 24 hours. SCR 1.45>.1.87>>2.31. Consider changing to daptomycin at /kg daily.   Plan: Continu Vancomycin to 1 GM IV every 24 hours Vancomycin trough at steady state as warranted F/u renal function and clinical course  Height:  (185.4 cm) Weight: 269 lb 6.4 oz (122.2 kg) IBW/kg (Calculated) : 79.9  Temp (24hrs), Avg:98.3 F (36.8 C), Min:98.1 F (36.7 C), Max:98.5 F (36.9 C)   Recent Labs Lab 09/15/15 0433 09/16/15 0601 09/17/15 0639 09/17/15 1821 09/18/15 0711 09/19/15 0652 09/20/15 0546  WBC 10.2 9.2  --   --   --   --   --   CREATININE 1.45* 1.83* 1.87*  --  1.75* 2.09* 2.31*  LATICACIDVEN 1.0  --   --   --   --   --   --   VANCOTROUGH  --   --   --  21*  --   --  18    Estimated Creatinine Clearance: 42.5 mL/min (by C-G formula based on Cr of 2.31).    No Known Allergies  Antimicrobials this admission: vanc 1/29 >> 2/2, 2/8 >> Zosyn 1/29 >> 2/1 ceftaroline 2/2>> 2/8  Dose adjustments this admission: 2/1 VT 26 on 1 gm q12, dose changed to 1250 q24 2/13 VT 23 on 750 mg Q12H SCr 1.43 2/17 VT 21 on 1250 mg every 24 hours SCR 1.87 2/20 VT 18 on 1gm q24hrs Scr 2.31   Thank you for allowing pharmacy to be a part of this patient's care.  Tera Mater 09/20/2015 11:30 AM

## 2015-09-20 NOTE — Telephone Encounter (Signed)
Roberto [name], Roberto [name] son called saying he'd like Dr. Karilyn Cota to confirm with Roberto [name] that it's ok for Dr. Karilyn Cota to verbally disclose protected health information to he and his wife Roberto [name]. Roberto [name] lives in [place] and said his father would like providers to disclose information to he and his wife since he doesn't understand everything. When Dr. Karilyn Cota and/or Roberto [name] have rounds and speak to and evaluate Mr. Roberto, [name] and Roberto [name] would like a phone call with an update on what was discussed. They have multiple questions for Dr. Karilyn Cota concerning Roberto [name]'s health.  Craig[facility] ph# [phone] Julie's ph# [phone]  Thank you.

## 2015-09-21 LAB — COMPREHENSIVE METABOLIC PANEL
ALBUMIN: 2.2 g/dL — AB (ref 3.5–5.0)
ALT: 30 U/L (ref 17–63)
AST: 61 U/L — AB (ref 15–41)
Alkaline Phosphatase: 73 U/L (ref 38–126)
Anion gap: 7 (ref 5–15)
BUN: 46 mg/dL — AB (ref 6–20)
CHLORIDE: 105 mmol/L (ref 101–111)
CO2: 22 mmol/L (ref 22–32)
Calcium: 7.8 mg/dL — ABNORMAL LOW (ref 8.9–10.3)
Creatinine, Ser: 2.41 mg/dL — ABNORMAL HIGH (ref 0.61–1.24)
GFR calc Af Amer: 30 mL/min — ABNORMAL LOW (ref >60)
GFR calc non Af Amer: 26 mL/min — ABNORMAL LOW (ref >60)
GLUCOSE: 111 mg/dL — AB (ref 65–99)
POTASSIUM: 3.2 mmol/L — AB (ref 3.5–5.1)
Sodium: 134 mmol/L — ABNORMAL LOW (ref 135–145)
Total Bilirubin: 1.2 mg/dL (ref 0.3–1.2)
Total Protein: 5.8 g/dL — ABNORMAL LOW (ref 6.5–8.1)

## 2015-09-21 LAB — CREATININE, URINE, RANDOM: CREATININE, URINE: 82.11 mg/dL

## 2015-09-21 LAB — SODIUM, URINE, RANDOM: SODIUM UR: 15 mmol/L

## 2015-09-21 MED ORDER — ALBUTEROL SULFATE (2.5 MG/3ML) 0.083% IN NEBU
2.5000 mg | INHALATION_SOLUTION | Freq: Three times a day (TID) | RESPIRATORY_TRACT | Status: DC
  Administered 2015-09-21 – 2015-09-23 (×7): 2.5 mg via RESPIRATORY_TRACT
  Filled 2015-09-21 (×7): qty 3

## 2015-09-21 MED ORDER — GUAIFENESIN ER 600 MG PO TB12
600.0000 mg | ORAL_TABLET | Freq: Two times a day (BID) | ORAL | Status: DC
  Administered 2015-09-21 – 2015-09-30 (×19): 600 mg via ORAL
  Filled 2015-09-21 (×19): qty 1

## 2015-09-21 MED ORDER — FUROSEMIDE 10 MG/ML IJ SOLN
80.0000 mg | Freq: Two times a day (BID) | INTRAMUSCULAR | Status: DC
  Administered 2015-09-21 – 2015-09-24 (×8): 80 mg via INTRAVENOUS
  Filled 2015-09-21 (×9): qty 8

## 2015-09-21 MED ORDER — ALBUTEROL SULFATE (2.5 MG/3ML) 0.083% IN NEBU: 2.5000 mg | INHALATION_SOLUTION | Freq: Four times a day (QID) | RESPIRATORY_TRACT | Status: DC | PRN

## 2015-09-21 MED ORDER — POTASSIUM CHLORIDE CRYS ER 20 MEQ PO TBCR
20.0000 meq | EXTENDED_RELEASE_TABLET | Freq: Every day | ORAL | Status: DC
  Administered 2015-09-21 – 2015-09-22 (×2): 20 meq via ORAL
  Filled 2015-09-21 (×2): qty 1

## 2015-09-21 MED ORDER — ENSURE ENLIVE PO LIQD: 237.0000 mL | Freq: Two times a day (BID) | ORAL | Status: DC

## 2015-09-21 NOTE — Consult Note (Addendum)
Reason for Consult: Renal failure Referring Physician: Dr. [name] is an 70 y.o. male.  HPI: He is a patient with history of COPD, hypertension, long-standing liver cirrhosis recently admitted to [facility] because of epidural abscess. Presently patient came with complaints of abdominal pain, some nausea, and vomiting. When he was evaluated patient was found to have possible ileus and ascites. Presently patient is still complains of some abdominal distention and pain. Nausea and vomiting however is getting better. Patient denies any previous history of renal failure or kidney stone. He had previous history of urinary tract infection. Presently patient BUN and creatinine has been increasing hence consult is called.  Past Medical History  Diagnosis Date  . COPD (chronic obstructive pulmonary disease) (HCC)   . Hypertension   . Hepatic steatosis 12/14/2011  . Compression fracture of L1 lumbar vertebra (HCC) 12/14/2011  . C2 cervical fracture (HCC)   . Duodenal ulcer with hemorrhage 12/15/2011    Likely source of bleeding.per EGD; Dr. Karilyn [name]  . Esophageal varices without bleeding (HCC) 12/15/2011    per EGD  . Cirrhosis (HCC) 12/16/2011    per ultrasound  . Hx of substance abuse 12/17/2011  . 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 Hepatic encephalopathy (HCC)   . Helicobacter pylori antibody positive 12/19/2011  . Encephalopathy 05/18/2012  . UTI (urinary tract infection) 12/13/2011  . Ascites 05/18/2012    [name]/p paracentesis yielding 7880 mL  . Cirrhosis (HCC)   . Depression     Past Surgical History  Procedure Laterality Date  . Coronary angioplasty with stent placement    . Thoracic laminectomy for epidural abscess N/A 09/07/2015    Procedure: THORACIC FIVE-THORACIC ELEVEN LAMINECTOMY FOR EPIDURAL ABSCESS;  Surgeon: Roberto Halt Ditty, MD;  Location: [facility];  Service: Neurosurgery;  Laterality: N/A;    History  reviewed. No pertinent family history.  Social History:  reports that he has been smoking Cigarettes.  He has been smoking about 1.00 pack per day. He uses smokeless tobacco. He reports that he drinks alcohol. He reports that he uses illicit drugs (Cocaine).  Allergies: No Known Allergies  Medications: I have reviewed the patient'[name] current medications.  Results for orders placed or performed during the hospital encounter of 09/15/15 (from the past 48 hour([name]))  Vancomycin, trough     Status: None   Collection Time: 09/20/15  5:46 AM  Result Value Ref Range   Vancomycin Tr 18 10.0 - 20.0 ug/mL  Basic metabolic panel     Status: Abnormal   Collection Time: 09/20/15  5:46 AM  Result Value Ref Range   Sodium 133 (L) 135 - 145 mmol/L   Potassium 3.6 3.5 - 5.1 mmol/L   Chloride 104 101 - 111 mmol/L   CO2 21 (L) 22 - 32 mmol/L   Glucose, Bld 107 (H) 65 - 99 mg/dL   BUN 44 (H) 6 - 20 mg/dL   Creatinine, Ser 4.58 (H) 0.61 - 1.24 mg/dL   Calcium 7.7 (L) 8.9 - 10.3 mg/dL   GFR calc non Af Amer 28 (L) >60 mL/min   GFR calc Af Amer 32 (L) >60 mL/min    Comment: (NOTE) The eGFR has been calculated using the CKD EPI equation. This calculation has not been validated in all clinical situations. eGFR'[name] persistently <60 mL/min signify possible Chronic Kidney Disease.    Anion gap 8 5 - 15  Hepatic function panel     Status: Abnormal   Collection  Time: 09/20/15  4:16 PM  Result Value Ref Range   Total Protein 5.9 (L) 6.5 - 8.1 g/dL   Albumin 2.3 (L) 3.5 - 5.0 g/dL   AST 66 (H) 15 - 41 U/L   ALT 31 17 - 63 U/L   Alkaline Phosphatase 72 38 - 126 U/L   Total Bilirubin 1.0 0.3 - 1.2 mg/dL   Bilirubin, Direct 0.4 0.1 - 0.5 mg/dL   Indirect Bilirubin 0.6 0.3 - 0.9 mg/dL  Protime-INR     Status: Abnormal   Collection Time: 09/20/15  4:16 PM  Result Value Ref Range   Prothrombin Time 19.7 (H) 11.6 - 15.2 seconds   INR 1.67 (H) 0.00 - 1.49    No results found.  Review of Systems   Constitutional: Positive for chills and malaise/fatigue. Negative for fever.  Respiratory: Positive for cough and shortness of breath. Negative for sputum production.   Cardiovascular: Positive for leg swelling. Negative for chest pain and orthopnea.  Gastrointestinal: Positive for nausea, vomiting and abdominal pain.  Neurological: Positive for weakness.   Blood pressure 110/56, pulse 79, temperature 98.1 F (36.7 C), temperature source Oral, resp. rate 20, height '6\' 1"'$  (1.854 m), weight 278 lb 10.6 oz (126.4 kg), SpO2 98 %. Physical Exam  Constitutional: He is oriented to person, place, and time. No distress.  Eyes: No scleral icterus.  Neck: JVD present.  Cardiovascular: Normal rate and regular rhythm.   Respiratory: No respiratory distress. He has wheezes. He has no rales.  GI: He exhibits distension. There is no tenderness. There is no rebound and no guarding.  Musculoskeletal: He exhibits edema.  Neurological: He is alert and oriented to person, place, and time.    Assessment/Plan: Problem #1 acute kidney injury: His creatinine was normal up to January of this year. His creatinine has increased from his baseline to 1.5-11/28/2017. The etiology could be secondary to sepsis/vancomycin/prerenal syndrome. Since patient has liver cirrhosis with borderline low blood pressure hepatorenal syndrome cannot be ruled out. Problem #2 epidural abscess: Presently complains of some weakness. Patient has been on vancomycin and he states that he is feeling somewhat better as far as moving his toes are concerned. Problem #3 history of liver cirrhosis: Presently patient with ascites. He complains of some pain and distention. Problem #4 history of hypertension: His blood pressure borderline low. Problem #5 history of COPD Problem #6 history of ileus Problem #7 anemia Problem #8 anasarca Plan: 1] We'll check urine sodium and creatinine. 2] We'll start patient on Lasix 80 mg IV twice a day. 3] We'll  follow compressive metabolic panel in the morning. 4] We'll try to put a Foley catheter to document input and output. 5] If His renal function continued to get worse we'll do ultrasound and possibly we may need to DC vancomycin.   Roberto [name] 09/21/2015, 8:18 AM

## 2015-09-21 NOTE — Progress Notes (Signed)
Roberto [name] ZOX:[id number] DOB: 1946/06/20 DOA: 09/15/2015 PCP: Roberto Stalling, MD  appreciate nephrology expertise as well as G.I  significant hypoproteinemia hypoalbuminemia assumedly contributing to ascites. Albumin on hold for now multifactorial renal insufficiency likely due to hepatorenal syndrome , vancomycin and possible infection.            Physical Exam: Blood pressure 110/56, pulse 79, temperature 98.1 F (36.7 C), temperature source Oral, resp. rate 20, height  (1.854 [name]), weight 278 lb 10.6 oz (126.4 kg), SpO2 98 %. lungs diminished breath sounds in the bases no rales wheeze or rhonchi appreciable abdomen protuberant Caput Medusae present bowel sounds are normoactive no peristaltic rushes no guarding or rebound extremities 3+ chronic pitting edema   Investigations:  No results found for this or any previous visit (from the past 240 hour(s)).   Basic Metabolic Panel:  Recent Labs  81/19/14 0546 09/21/15 0859  NA 133* 134*  K 3.6 3.2*  CL 104 105  CO2 21* 22  GLUCOSE 107* 111*  BUN 44* 46*  CREATININE 2.31* 2.41*  CALCIUM 7.7* 7.8*   Liver Function Tests:  Recent Labs  09/20/15 1616 09/21/15 0859  AST 66* 61*  ALT 31 30  ALKPHOS 72 73  BILITOT 1.0 1.2  PROT 5.9* 5.8*  ALBUMIN 2.3* 2.2*     CBC: No results for input(s): WBC, NEUTROABS, HGB, HCT, MCV, PLT in the last 72 hours.  No results found.    Medications:   Impression: Hypoproteinemia Active Problems:   HTN (hypertension)   Renal insufficiency   Debility   Cirrhosis (HCC)   Hx of substance abuse   Adynamic ileus (HCC)   Ascites   Epidural abscess   Chronic diastolic heart failure (HCC)   Ileus (HCC)   Abdominal pain     Plan: Follow renal recommendations KCl 30 mEq IV today monitor CMP in a.[name]. continue vancomycin for now  Consultants: Gastroenterology and nephrology    Procedures   Antibiotics: Vancomycin                  Code Status: Full  Family  Communication:    Disposition Plan see plan above  Time spent: 30 minutes   LOS: 6 days   Roberto [name]   09/21/2015, 12:02 PM

## 2015-09-21 NOTE — Telephone Encounter (Signed)
Forwarded to Dr.Rehman. 

## 2015-09-21 NOTE — Care Management Note (Signed)
Case Management Note  Patient Details  Name: Roberto [name] MRN: [id number] Date of Birth: May 17, 1946  Expected Discharge Date:                  Expected Discharge Plan:  Skilled Nursing Facility  In-House Referral:  Clinical Social Work  Discharge planning Services  CM Consult  Post Acute Care Choice:  NA Choice offered to:  NA  DME Arranged:    DME Agency:     HH Arranged:    HH Agency:     Status of Service:  Completed, signed off  Medicare Important Message Given:  Yes Date Medicare IM Given:    Medicare IM give by:    Date Additional Medicare IM Given:    Additional Medicare Important Message give by:     If discussed at Long Length of Stay Meetings, dates discussed:  09/21/2015  Additional Comments:  Malcolm Metro, RN 09/21/2015, 1:11 PM

## 2015-09-22 LAB — BASIC METABOLIC PANEL
ANION GAP: 9 (ref 5–15)
ANION GAP: 6 (ref 5–15)
BUN: 47 mg/dL — AB (ref 6–20)
BUN: 46 mg/dL — AB (ref 6–20)
CHLORIDE: 104 mmol/L (ref 101–111)
CHLORIDE: 106 mmol/L (ref 101–111)
CO2: 20 mmol/L — AB (ref 22–32)
CO2: 22 mmol/L (ref 22–32)
Calcium: 7.4 mg/dL — ABNORMAL LOW (ref 8.9–10.3)
Calcium: 7.2 mg/dL — ABNORMAL LOW (ref 8.9–10.3)
Creatinine, Ser: 2.5 mg/dL — ABNORMAL HIGH (ref 0.61–1.24)
Creatinine, Ser: 2.46 mg/dL — ABNORMAL HIGH (ref 0.61–1.24)
GFR calc Af Amer: 29 mL/min — ABNORMAL LOW (ref >60)
GFR calc Af Amer: 30 mL/min — ABNORMAL LOW (ref >60)
GFR calc non Af Amer: 25 mL/min — ABNORMAL LOW (ref >60)
GFR, EST NON AFRICAN AMERICAN: 26 mL/min — AB (ref >60)
GLUCOSE: 115 mg/dL — AB (ref 65–99)
GLUCOSE: 132 mg/dL — AB (ref 65–99)
POTASSIUM: 3.1 mmol/L — AB (ref 3.5–5.1)
POTASSIUM: 3.2 mmol/L — AB (ref 3.5–5.1)
Sodium: 133 mmol/L — ABNORMAL LOW (ref 135–145)
Sodium: 134 mmol/L — ABNORMAL LOW (ref 135–145)

## 2015-09-22 LAB — CBC
HEMATOCRIT: 20.6 % — AB (ref 39.0–52.0)
HEMOGLOBIN: 6.9 g/dL — AB (ref 13.0–17.0)
MCH: 33.7 pg (ref 26.0–34.0)
MCHC: 33.5 g/dL (ref 30.0–36.0)
MCV: 100.5 fL — AB (ref 78.0–100.0)
Platelets: 108 10*3/uL — ABNORMAL LOW (ref 150–400)
RBC: 2.05 MIL/uL — AB (ref 4.22–5.81)
RDW: 20.4 % — ABNORMAL HIGH (ref 11.5–15.5)
WBC: 10.5 10*3/uL (ref 4.0–10.5)

## 2015-09-22 LAB — PREPARE RBC (CROSSMATCH)

## 2015-09-22 LAB — MAGNESIUM: MAGNESIUM: 1.9 mg/dL (ref 1.7–2.4)

## 2015-09-22 MED ORDER — SODIUM CHLORIDE 0.9 % IV SOLN: Freq: Once | INTRAVENOUS | Status: DC

## 2015-09-22 MED ORDER — TAMSULOSIN HCL 0.4 MG PO CAPS
0.4000 mg | ORAL_CAPSULE | Freq: Every day | ORAL | Status: DC
  Administered 2015-09-22 – 2015-09-30 (×9): 0.4 mg via ORAL
  Filled 2015-09-22 (×9): qty 1

## 2015-09-22 MED ORDER — TAMSULOSIN HCL 0.4 MG PO CAPS: 0.4000 mg | ORAL_CAPSULE | Freq: Every day | ORAL | Status: DC

## 2015-09-22 NOTE — Telephone Encounter (Signed)
Call returned; Condition discussed with son [name] and daughter. They were brought upto date on his condition. I told them that he is not getting better. He has not had any recovery of lower extremity paralysis, fluid overload has gotten worse;  Other issues include fluid overload colonic ileus and worsening renal function although creatinine has decreased some in the last 24 hours.

## 2015-09-22 NOTE — Progress Notes (Signed)
  Subjective:  Asian complains of pain across upper abdomen. Feels abdomen is more distended than it was yesterday. He denies nausea or vomiting. He is passing flatus intermittently. He states sometimes he passes large amount. No melena or rectal bleeding reported. He also complains of back pain and wonders will remove staples. He states he can only move left toes.    Objective: Blood pressure 146/83, pulse 70, temperature 98.2 F (36.8 C), temperature source Oral, resp. rate 21, height  (1.854 m), weight 278 lb 10.6 oz (126.4 kg), SpO2 97 %. She is alert and in no acute distress. Conjunctiva is pale. Sclera nonicteric.  Abdomen is distended particularly involving upper half. Bowel sounds are present.on palpation abdomen is distended and tight across upper abdomen. Percussion note is very tympanitic.  Rectal examination performed revealing greenish pasty stool. Ration was able to bear down and able to expel some flatus during rectal exam. He has 4+ pitting edema involving both legs with shiny skin with discoloration but no ulceration. He also has pitting edema involving both thighs.  Urine output 2690 mL.  Labs/studies Results:   Recent Labs  09/22/15 0653  WBC 10.5  HGB 6.9*  HCT 20.6*  PLT 108*    BMET   Recent Labs  09/21/15 0859 09/22/15 0630 09/22/15 1341  NA 134* 133* 134*  K 3.2* 3.1* 3.2*  CL 105 104 106  CO2 22 20* 22  GLUCOSE 111* 115* 132*  BUN 46* 47* 46*  CREATININE 2.41* 2.50* 2.46*  CALCIUM 7.8* 7.4* 7.2*    LFT   Recent Labs  09/20/15 1616 09/21/15 0859  PROT 5.9* 5.8*  ALBUMIN 2.3* 2.2*  AST 66* 61*  ALT 31 30  ALKPHOS 72 73  BILITOT 1.0 1.2  BILIDIR 0.4  --   IBILI 0.6  --      Assessment:  #1. Fluid overload. Patient weighed 257 pounds days ago any weight 278 pounds yesterday. Presuming these weights are recommended he has gained 21 pounds in the last 8 days. Most of this fluid appears to his lower extremities and inform of  tissue edema. He had abdominal ultrasound on 09/17/2015 revealing amount of ascites. Urine output is improving and hopefully fluid balance will improve. #2. Abdominal distention he has to be secondary to colonic distention which was well-documented during recent hospitalization.Discussion is virtually immobile and this combined with pain medication is making this condition relapse.  #3. Anemia. Patient is receiving a unit of PRBCs. Anemia appears to be multifactorial. He does not have overt GI bleed. Hemoccult is pending. #4. Renal failure. Patient is being followed by Dr. Fausto Skillern and associates. Renal dysfunction felt to be multifactorial. #4.Decompensated alcoholic liver disease. Hepatic encephalopathy appears to have responded therapy. Hepatic function unfortunately is not improving.  #5.  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  Patient's overall prognosis remains very poor.  Recommendations;  Continue to monitor H&H and electrolytes. If patient has overt GI bleed will consider elevation with therapeutic intention. Patient encouraged to expel flatus as often as he can.

## 2015-09-22 NOTE — Progress Notes (Addendum)
Patient did not have much urine in his foley bag after having a lot of output earlier. Bladder scan showed that there was about 700 cc urine in patient's bladder. Attempts to troubleshoot the foley unsuccessful due to penile edema. Paged MD on call. Was instructed to remove foley and reinsert new foley. Old foley removed. There was a moderate amount of urine that came out after the foley was removed. Attempted to insert new foley. Unsuccessful.

## 2015-09-22 NOTE — Progress Notes (Signed)
Roberto [name]  MRN: [id number]  DOB/AGE: 1946/04/08 70 y.o.  Primary Care Physician:DONDIEGO,RICHARD M, MD  Admit date: 09/15/2015  Chief Complaint:  Chief Complaint  Patient presents with  . Abdominal Pain    S-Pt presented on  09/15/2015 with  Chief Complaint  Patient presents with  . Abdominal Pain  .    Pt today feels better.pt voices no new concerns.  Meds . sodium chloride   Intravenous Once  . albuterol  2.5 mg Nebulization TID  . citalopram  20 mg Oral Daily  . feeding supplement (ENSURE ENLIVE)  237 mL Oral BID BM  . furosemide  80 mg Intravenous BID  . guaiFENesin  600 mg Oral BID  . magnesium oxide  400 mg Oral BID  . oxyCODONE  5 mg Oral TID WC & HS  . pantoprazole  40 mg Oral Daily  . potassium chloride  20 mEq Oral Daily  . rifaximin  550 mg Oral BID  . tamsulosin  0.4 mg Oral Daily  . vancomycin  1,000 mg Intravenous Q24H     Physical Exam: Vital signs in last 24 hours: Temp:  [98.2 F (36.8 C)-98.6 F (37 C)] 98.4 F (36.9 C) (02/22 0532) Pulse Rate:  [74-86] 86 (02/22 0532) Resp:  [20] 20 (02/22 0532) BP: (92-114)/(60-64) 92/60 mmHg (02/22 0532) SpO2:  [95 %-97 %] 96 % (02/22 0700) Weight change:  Last BM Date: 09/21/15  Intake/Output from previous day: 02/21 0701 - 02/22 0700 In: 1155.8 [P.O.:480; I.V.:675.8] Out: 2690 [Urine:2690]     Physical Exam: General- pt is awake,alert,follows commands  Resp- No acute REsp distress, decreased bs at bases. CVS- S1S2 regular in rate and rhythm GIT- BS+, soft, NT, Distended EXT-2+ LE Edema, NO Cyanosis   Lab Results: CBC  Recent Labs  09/22/15 0653  WBC 10.5  HGB 6.9*  HCT 20.6*  PLT 108*    BMET  Recent Labs  09/20/15 0546 09/21/15 0859  NA 133* 134*  K 3.6 3.2*  CL 104 105  CO2 21* 22  GLUCOSE 107* 111*  BUN 44* 46*  CREATININE 2.31* 2.41*  CALCIUM 7.7* 7.8*    MICRO No results found for this or any previous visit (from the past 240 hour(s)).    Lab Results   Component Value Date   CALCIUM 7.8* 09/21/2015   CAION 1.00* 09/07/2015   PHOS 3.7 09/20/2014               Impression: 1)Renal  AKI secondary to Mutliple factors                     Prerenal/ATN/ Hepatorenal /Vanco                     AKI now nearing  plateau                           2)CVs-Hypotensive Medication- On Diuretics  3)Anemia HGb low   4)ID-hx of Epidural abscess On IV vanco  5)Liver- hx of Cirrhosis GI and Primary MD following  6)Electrolytes Hypokalemic    Sec to diuresis     beinf replaced  Hyponatremic    Hypervolemia  7)Acid base Co2 at goal     Plan:  Will continue current care If creat worsening , will plan for holding vanco/chnaging the antibiotics.    BHUTANI,MANPREET S 09/22/2015, 10:59 AM

## 2015-09-22 NOTE — Progress Notes (Signed)
[name] met available from this morning we will order one step will transfuse 1 unit of PRBCs hemoglobin of 6.9 assess lungs and then consider second transfusion later this evening. patient has EF of 60-65% and has diminished Plasma oncotic pressure low proteins and albumin. Patient assures me is drinking 2 cans of ensure daily. Continue diuresis for anasarca possible hepatorenal physiology Roberto [name] BHA:[id number] DOB: [birth year] DOA: 09/15/2015 PCP: Roberto Curet, MD             Physical Exam: Blood pressure 92/60, pulse 86, temperature 98.4 F (36.9 C), temperature source Oral, resp. rate 20, height '6\' 1"'$  (1.854 m), weight 278 lb 10.6 oz (126.4 kg), SpO2 96 %. lungs show diminished breath sounds in the bases mild end expiratory wheezes noted bilaterally no rhonchi no rales audible heart regular rhythm no S3-S4 no heaves thrills or rubs generalized anasarca of the scrotum abdomen and lower extremities noted   Investigations:  No results found for this or any previous visit (from the past 240 hour(s)).   Basic Metabolic Panel:  Recent Labs  09/20/15 0546 09/21/15 0859 09/22/15 0653  NA 133* 134*  --   K 3.6 3.2*  --   CL 104 105  --   CO2 21* 22  --   GLUCOSE 107* 111*  --   BUN 44* 46*  --   CREATININE 2.31* 2.41*  --   CALCIUM 7.7* 7.8*  --   MG  --   --  1.9   Liver Function Tests:  Recent Labs  09/20/15 1616 09/21/15 0859  AST 66* 61*  ALT 31 30  ALKPHOS 72 73  BILITOT 1.0 1.2  PROT 5.9* 5.8*  ALBUMIN 2.3* 2.2*     CBC:  Recent Labs  09/22/15 0653  WBC 10.5  HGB 6.9*  HCT 20.6*  MCV 100.5*  PLT 108*    No results found.    Medications:   Impression: Generalized anasarca Hypoproteinemia hypoalbuminemia Anemia Active Problems:   HTN (hypertension)   Renal insufficiency   Debility   Cirrhosis (HCC)   Hx of substance abuse   Adynamic ileus (HCC)   Ascites   Epidural abscess   Chronic diastolic heart failure (HCC)   Ileus  (HCC)   Abdominal pain     Plan: B metts stat. Continue diuresis as per nephrology. Monitor electrolytes protein status transfuse 1 unit PRBCs. Assess lungs and consider transfusion of second unit PRBCs. Continue ensure 1 can by mouth twice a day 2 improve protein status  Consultants: Nephrology and gastroenterology    Procedures   Antibiotics:                   Code Status:   Family Communication:    Disposition Plan see plan above  Time spent: 30 minutes   LOS: 7 days   Roberto [name] M   09/22/2015, 12:20 PM

## 2015-09-22 NOTE — Progress Notes (Signed)
CRITICAL VALUE ALERT  Critical value received:  Hgb 6.9  Date of notification:  09/22/15  Time of notification:  0715  Critical value read back: yes  Nurse who received alert:  L. Junita Push, RN  MD notified (1st page):  Dr. Janna Arch  Time of first page: 0730  MD notified (2nd page):  Time of second page:  Responding MD:  Dr. Janna Arch  Time MD responded:  0730  Order received to type and cross patient for 2 units. Also, obtain a hemoccult stool card.

## 2015-09-22 NOTE — Progress Notes (Signed)
Contacted Dr. Janna Arch to inform that attempts to reinsert foley were unsuccessful. Received order for flomax. Instructed to give flomax, wait an hour and try to reinsert foley. If unsuccessful, consult urology. Passed this on to morning shift nurse who verbalizes understanding.

## 2015-09-23 LAB — VANCOMYCIN, RANDOM: VANCOMYCIN RM: 26 ug/mL

## 2015-09-23 LAB — CBC
HEMATOCRIT: 21.2 % — AB (ref 39.0–52.0)
HEMOGLOBIN: 7 g/dL — AB (ref 13.0–17.0)
MCH: 32.4 pg (ref 26.0–34.0)
MCHC: 33 g/dL (ref 30.0–36.0)
MCV: 98.1 fL (ref 78.0–100.0)
Platelets: 101 10*3/uL — ABNORMAL LOW (ref 150–400)
RBC: 2.16 MIL/uL — ABNORMAL LOW (ref 4.22–5.81)
RDW: 22.3 % — AB (ref 11.5–15.5)
WBC: 8.5 10*3/uL (ref 4.0–10.5)

## 2015-09-23 LAB — BASIC METABOLIC PANEL
Anion gap: 5 (ref 5–15)
BUN: 45 mg/dL — ABNORMAL HIGH (ref 6–20)
CHLORIDE: 105 mmol/L (ref 101–111)
CO2: 25 mmol/L (ref 22–32)
Calcium: 7.4 mg/dL — ABNORMAL LOW (ref 8.9–10.3)
Creatinine, Ser: 2.34 mg/dL — ABNORMAL HIGH (ref 0.61–1.24)
GFR calc Af Amer: 31 mL/min — ABNORMAL LOW (ref >60)
GFR calc non Af Amer: 27 mL/min — ABNORMAL LOW (ref >60)
Glucose, Bld: 126 mg/dL — ABNORMAL HIGH (ref 65–99)
Potassium: 2.9 mmol/L — ABNORMAL LOW (ref 3.5–5.1)
Sodium: 135 mmol/L (ref 135–145)

## 2015-09-23 LAB — PREPARE RBC (CROSSMATCH)

## 2015-09-23 MED ORDER — POTASSIUM CHLORIDE 10 MEQ/100ML IV SOLN
10.0000 meq | INTRAVENOUS | Status: AC
  Administered 2015-09-23 (×3): 10 meq via INTRAVENOUS
  Filled 2015-09-23 (×2): qty 100

## 2015-09-23 MED ORDER — SPIRONOLACTONE 25 MG PO TABS
50.0000 mg | ORAL_TABLET | Freq: Every day | ORAL | Status: DC
  Administered 2015-09-23 – 2015-09-30 (×8): 50 mg via ORAL
  Filled 2015-09-23 (×8): qty 2

## 2015-09-23 MED ORDER — POTASSIUM CHLORIDE CRYS ER 20 MEQ PO TBCR
40.0000 meq | EXTENDED_RELEASE_TABLET | Freq: Two times a day (BID) | ORAL | Status: DC
  Administered 2015-09-23 (×2): 40 meq via ORAL
  Filled 2015-09-23 (×3): qty 2

## 2015-09-23 MED ORDER — ALBUTEROL SULFATE (2.5 MG/3ML) 0.083% IN NEBU
2.5000 mg | INHALATION_SOLUTION | Freq: Two times a day (BID) | RESPIRATORY_TRACT | Status: DC
  Administered 2015-09-24 – 2015-09-27 (×8): 2.5 mg via RESPIRATORY_TRACT
  Filled 2015-09-23 (×8): qty 3

## 2015-09-23 MED ORDER — VANCOMYCIN HCL 10 G IV SOLR
1500.0000 mg | INTRAVENOUS | Status: DC
  Administered 2015-09-24 – 2015-09-30 (×3): 1500 mg via INTRAVENOUS
  Filled 2015-09-23 (×4): qty 1500

## 2015-09-23 MED ORDER — POTASSIUM CHLORIDE CRYS ER 20 MEQ PO TBCR: 20.0000 meq | EXTENDED_RELEASE_TABLET | Freq: Every day | ORAL | Status: DC

## 2015-09-23 NOTE — Progress Notes (Signed)
  Subjective:  Patient complains of upper abdominal pain. He feels bloated. He states he ate too much at lunch. He denies nausea or vomiting. He has passing flatus intermittently. He denies shortness of breath.    Objective: Blood pressure 170/90, pulse 80, temperature 98 F (36.7 C), temperature source Oral, resp. rate 20, height  (1.854 m), weight 278 lb 10.6 oz (126.4 kg), SpO2 97 %. Patient is alert and does not have asterixis. Conjunctiva is pale. Sclera nonicteric.  Abdomen remains distended. Bowel sounds are present. Percussion note is very tympanitic across upper abdomen. It is softer across lower abdomen. No organomegaly or masses. Lower extremity edema is unchanged. On palpation subcutaneous tissue is not as tight as yesterday. He is able to move toes of both feet and he has some movement at left ankle.  Urine output 4175 mL.  Labs/studies Results:   Recent Labs  09/22/15 0653 09/23/15 0541  WBC 10.5 8.5  HGB 6.9* 7.0*  HCT 20.6* 21.2*  PLT 108* 101*    BMET   Recent Labs  09/22/15 0630 09/22/15 1341 09/23/15 0541  NA 133* 134* 135  K 3.1* 3.2* 2.9*  CL 104 106 105  CO2 20* 22 25  GLUCOSE 115* 132* 126*  BUN 47* 46* 45*  CREATININE 2.50* 2.46* 2.34*  CALCIUM 7.4* 7.2* 7.4*    LFT   Recent Labs  09/20/15 1616 09/21/15 0859  PROT 5.9* 5.8*  ALBUMIN 2.3* 2.2*  AST 66* 61*  ALT 31 30  ALKPHOS 72 73  BILITOT 1.0 1.2  BILIDIR 0.4  --   IBILI 0.6  --      Assessment:  #1. Fluid overload. Patient is having excellent diureses. Hopefully this will continue and fluid balance will improve. #2. Abdominal distention secondary to colonic distention.patient has multiple risk factors for colonic ileus. Nursing staff informed the patient needs to be turned every few hours. #3. Anemia. Hemoglobin did not come much after 1 unit yesterday and patient has received another unit today. No evidence of active GI bleed. Stool guaiac is pending. #4. Renal  failure. Renal function is improving. Discussed with Dr. Fausto Skillern earlier today. Patient will continue on IV vancomycin for now. I wonder if he also had obstructive uropathy or neurogenic bladder contribution to renal dysfunction. #4.alcoholic cirrhosis complicated by hepatic encephalopathy as well as coagulopathy. #5.  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 #6. Hypokalemia. Patient is receiving IV runs of potassium.   Please note that I discussed patient's condition with his son [name] and daughter-in-law Marcelino Duster last night.  Recommendations;  Will check INR with a.m. Lab. Will place rectal tube if abdominal distention gets worse.

## 2015-09-23 NOTE — Progress Notes (Signed)
   Subjective:    Patient ID: Roberto [name], male    DOB: 26-Dec-1945, 70 y.o.   MRN: [id number]  Abdominal Pain This is a new problem. The current episode started in the past 7 days. The problem occurs daily. The problem has been gradually improving. The pain is mild. Associated symptoms include constipation. Pertinent negatives include no nausea or vomiting.      Review of Systems  Gastrointestinal: Positive for abdominal pain and constipation. Negative for nausea and vomiting.       Objective:   Physical Exam Patient is alert and in no apparent distress Chest decreased breath sounds bilaterally Heart exam regular rate and rhythm no murmur Abdomen: Distended, nontender and positive bowel sound Extremities 3+ edema       Assessment & Plan:  Problem #1 acute and injury: Possibly ATN/prerenal. His renal function is stable. Problem #2 hypokalemia: Secondary to diuretics. His potassium is low Problem #3 anasarca: Presently he is on diuretics. His urine output has improved significantly. Patient however still has significant sign of fluid overload. Patient had 4100 mL of urine output. Problem #4 liver cirrhosis Problem #5 history of hypertension: His blood pressure is improving. Problem #6 history of a dynamics ileus Problem #7 history of COPD Plan: We'll use KCl 10 mEq IV over 1 hour 3 doses 2] we'll use KCl 40 mg 1 by mouth twice a day 3] we'll start on Aldactone 50 by mouth daily

## 2015-09-23 NOTE — Progress Notes (Signed)
Patient received 1 of 2 units of PRBCs no respiratory distress this a.m. Will administer second unit today creatinine leveled off at 2.46 with Lasix 80 twice a day potassium low Will administer K Dur 20 by mouth daily on at her electrolytes daily. CBC of today still pending Roberto [name] TVD:[id number] DOB: 10-28-1945 DOA: 09/15/2015 PCP: Roberto Curet, MD             Physical Exam: Blood pressure 170/90, pulse 80, temperature 98 F (36.7 C), temperature source Oral, resp. rate 20, height _0  (1.854 m), weight 278 lb 10.6 oz (126.4 kg), SpO2 99 %. lungs diminished breath sounds in the bases no rales no wheezes no rhonchi appreciable heart regular rhythm no S3-S4 no heaves thrills rubs abdomen soft bowel sounds normoactive no peristaltic rushes positive fluid ascites as well as 4+ lower extremity edema   Investigations:  No results found for this or any previous visit (from the past 240 hour(s)).   Basic Metabolic Panel:  Recent Labs  09/22/15 0630 09/22/15 0653 09/22/15 1341  NA 133*  --  134*  K 3.1*  --  3.2*  CL 104  --  106  CO2 20*  --  22  GLUCOSE 115*  --  132*  BUN 47*  --  46*  CREATININE 2.50*  --  2.46*  CALCIUM 7.4*  --  7.2*  MG  --  1.9  --    Liver Function Tests:  Recent Labs  09/20/15 1616 09/21/15 0859  AST 66* 61*  ALT 31 30  ALKPHOS 72 73  BILITOT 1.0 1.2  PROT 5.9* 5.8*  ALBUMIN 2.3* 2.2*     CBC:  Recent Labs  09/22/15 0653  WBC 10.5  HGB 6.9*  HCT 20.6*  MCV 100.5*  PLT 108*    No results found.    Medications:   Impression:  Active Problems:   HTN (hypertension)   Renal insufficiency   Debility   Cirrhosis (HCC)   Hx of substance abuse   Adynamic ileus (HCC)   Ascites   Epidural abscess   Chronic diastolic heart failure (HCC)   Ileus (HCC)   Abdominal pain     Plan: Administer second unit of PRBCs today. K Dur 20 mg by mouth daily lower extremity compression devices to mobilize third space fluids  CBC pending be met in a.m. continue Lasix 80 IV twice a day. Continue ensure twice a day. Did give vancomycin as renal function has not deteriorated  Consultants: Gastroenterology nephrology   Procedures   Antibiotics: Vancomycin                  Code Status: Full   Family Communication:  Spoke with patient  Disposition Plan see plan above  Time spent: 30 minutes   LOS: 8 days   Roberto [name] M   09/23/2015, 7:05 AM

## 2015-09-23 NOTE — Care Management Note (Signed)
Case Management Note  Patient Details  Name: Roberto [name] MRN: [id number] Date of Birth: [birth year]  Subjective/Objective:                    Action/Plan:   Expected Discharge Date:                  Expected Discharge Plan:  Skilled Nursing Facility  In-House Referral:  Clinical Social Work  Discharge planning Services  CM Consult  Post Acute Care Choice:  NA Choice offered to:  NA  DME Arranged:    DME Agency:     HH Arranged:    HH Agency:     Status of Service:  Completed, signed off  Medicare Important Message Given:  Yes Date Medicare IM Given:    Medicare IM give by:    Date Additional Medicare IM Given:    Additional Medicare Important Message give by:     If discussed at Long Length of Stay Meetings, dates discussed:  09/23/2015  Additional Comments:  Malcolm Metro, RN 09/23/2015, 5:00 PM

## 2015-09-23 NOTE — Clinical Social Work Note (Signed)
CSW spoke with patient's daughter in law who advised that she and patient's sons were interested in having a living will, POA completed.   Annice Needy, [place]  [phone]

## 2015-09-23 NOTE — Progress Notes (Signed)
Pharmacy Antibiotic Note  Roberto [name] is a 70 y.o. male admitted on 09/15/2015 with MRSA bacteremia. Planned Vancomycin through 11/02/2015 for spinal abscess. Pharmacy has been consulted for vancomycin dosing.  Vanc trough this am is elevated at 26 mg/L.  Daptomycin /kg/day may be good alternative if renal function worsens.  Plan: Change vancomycin to 1500 mg IV every 48 hours Check vanc trough when appropriate F/u renal function and clinical course  Height:  (185.4 cm) Weight: 278 lb 10.6 oz (126.4 kg) (told RN) IBW/kg (Calculated) : 79.9  Temp (24hrs), Avg:98.2 F (36.8 C), Min:97.7 F (36.5 C), Max:98.5 F (36.9 C)   Recent Labs Lab 09/17/15 1821  09/20/15 0546 09/21/15 0859 09/22/15 0630 09/22/15 0653 09/22/15 1341 09/23/15 0541  WBC  --   --   --   --   --  10.5  --  8.5  CREATININE  --   < > 2.31* 2.41* 2.50*  --  2.46* 2.34*  VANCOTROUGH 21*  --  18  --   --   --   --   --   VANCORANDOM  --   --   --   --   --   --   --  26  < > = values in this interval not displayed.  Estimated Creatinine Clearance: 42.7 mL/min (by C-G formula based on Cr of 2.34).    No Known Allergies  Antimicrobials this admission: vanc 1/29 >> 2/2, 2/8 >> Zosyn 1/29 >> 2/1 ceftaroline 2/2>> 2/8  Dose adjustments this admission: 2/1 VT 26 on 1 gm q12, dose changed to 1250 q24 2/13 VT 23 on 750 mg Q12H SCr 1.43 2/17 VT 21 on 1250 mg every 24 hours SCR 1.87 2/20 VT 18 on 1gm q24hrs Scr 2.31  2/23  VT 26 on 1 gm q24, dose changed to 1500 q48  Thank you for allowing pharmacy to be a part of this patient's care.  Talbert Cage Poteet 09/23/2015 8:09 AM

## 2015-09-24 LAB — TYPE AND SCREEN
ABO/RH(D): A POS
Antibody Screen: NEGATIVE
UNIT DIVISION: 0
Unit division: 0

## 2015-09-24 LAB — BASIC METABOLIC PANEL
Anion gap: 6 (ref 5–15)
BUN: 40 mg/dL — AB (ref 6–20)
CALCIUM: 7.3 mg/dL — AB (ref 8.9–10.3)
CO2: 26 mmol/L (ref 22–32)
CREATININE: 1.98 mg/dL — AB (ref 0.61–1.24)
Chloride: 104 mmol/L (ref 101–111)
GFR calc non Af Amer: 33 mL/min — ABNORMAL LOW (ref >60)
GFR, EST AFRICAN AMERICAN: 38 mL/min — AB (ref >60)
GLUCOSE: 106 mg/dL — AB (ref 65–99)
Potassium: 2.9 mmol/L — ABNORMAL LOW (ref 3.5–5.1)
Sodium: 136 mmol/L (ref 135–145)

## 2015-09-24 LAB — CBC
HEMATOCRIT: 23.5 % — AB (ref 39.0–52.0)
Hemoglobin: 8.1 g/dL — ABNORMAL LOW (ref 13.0–17.0)
MCH: 33.6 pg (ref 26.0–34.0)
MCHC: 34.5 g/dL (ref 30.0–36.0)
MCV: 97.5 fL (ref 78.0–100.0)
Platelets: 96 10*3/uL — ABNORMAL LOW (ref 150–400)
RBC: 2.41 MIL/uL — ABNORMAL LOW (ref 4.22–5.81)
RDW: 22.4 % — AB (ref 11.5–15.5)
WBC: 8.9 10*3/uL (ref 4.0–10.5)

## 2015-09-24 LAB — PROTIME-INR
INR: 1.68 — AB (ref 0.00–1.49)
INR: 1.56 — ABNORMAL HIGH (ref 0.00–1.49)
Prothrombin Time: 19.8 seconds — ABNORMAL HIGH (ref 11.6–15.2)
Prothrombin Time: 18.8 seconds — ABNORMAL HIGH (ref 11.6–15.2)

## 2015-09-24 MED ORDER — POLYETHYLENE GLYCOL 3350 17 G PO PACK
17.0000 g | PACK | Freq: Every day | ORAL | Status: DC
  Administered 2015-09-24 – 2015-09-29 (×6): 17 g via ORAL
  Filled 2015-09-24 (×6): qty 1

## 2015-09-24 MED ORDER — WARFARIN - PHARMACIST DOSING INPATIENT
Status: DC
  Administered 2015-09-24: 1
  Administered 2015-09-25 – 2015-09-30 (×3)

## 2015-09-24 MED ORDER — POTASSIUM CHLORIDE CRYS ER 20 MEQ PO TBCR
40.0000 meq | EXTENDED_RELEASE_TABLET | Freq: Three times a day (TID) | ORAL | Status: DC
  Administered 2015-09-24 – 2015-09-25 (×6): 40 meq via ORAL
  Filled 2015-09-24 (×6): qty 2

## 2015-09-24 MED ORDER — WARFARIN SODIUM 5 MG PO TABS
5.0000 mg | ORAL_TABLET | Freq: Once | ORAL | Status: AC
  Administered 2015-09-24: 5 mg via ORAL
  Filled 2015-09-24: qty 1

## 2015-09-24 NOTE — Care Management Important Message (Signed)
Important Message  Patient Details  Name: KARRY CAUSER MRN: [id number] Date of Birth: 12-16-1945   Medicare Important Message Given:  Yes    Malcolm Metro, RN 09/24/2015, 9:25 AM

## 2015-09-24 NOTE — Progress Notes (Signed)
MD ordered to have a consult to pharmacy for coumadin. Order placed. Lesly Dukes, RN

## 2015-09-24 NOTE — Progress Notes (Addendum)
Patient discussed in progression meeting this morning. Current plan is anticipated to change to po Lasix tomorrow with possible stability on Sunday.  SW spoke with Marta Lamas, Admissions at [facility]. States that her bed status is very tight but anticipates 1 bed for patient if d/c'd over the weekend.Message left for son Amado Andal re: questions about Living Will and HCPOA.    Lorri Frederick. Jaci Lazier, LCSW [phone] (coverage)

## 2015-09-24 NOTE — Progress Notes (Signed)
ANTICOAGULATION CONSULT NOTE - Initial Consult  Pharmacy Consult for Coumadin Indication: VTE prophylaxis  No Known Allergies  Patient Measurements: Height:  (185.4 cm) Weight: 278 lb 10.6 oz (126.4 kg) (told RN) IBW/kg (Calculated) : 79.9  Vital Signs: Temp: 98 F (36.7 C) (02/24 0629) Temp Source: Oral (02/24 0629) BP: 90/41 mmHg (02/24 0700) Pulse Rate: 73 (02/24 0629)  Labs:  Recent Labs  09/22/15 0653 09/22/15 1341 09/23/15 0541 09/24/15 0534  HGB 6.9*  --  7.0* 8.1*  HCT 20.6*  --  21.2* 23.5*  PLT 108*  --  101* 96*  LABPROT  --   --   --  19.8*  INR  --   --   --  1.68*  CREATININE  --  2.46* 2.34* 1.98*   Medical History: Past Medical History  Diagnosis Date  . COPD (chronic obstructive pulmonary disease) (HCC)   . Hypertension   . Hepatic steatosis 12/14/2011  . Compression fracture of L1 lumbar vertebra (HCC) 12/14/2011  . C2 cervical fracture (HCC)   . Duodenal ulcer with hemorrhage 12/15/2011    Likely source of bleeding.per EGD; Dr. Karilyn Cota  . Esophageal varices without bleeding (HCC) 12/15/2011    per EGD  . Cirrhosis (HCC) 12/16/2011    per ultrasound  . Hx of substance abuse 12/17/2011  . 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 Hepatic encephalopathy (HCC)   . Helicobacter pylori antibody positive 12/19/2011  . Encephalopathy 05/18/2012  . UTI (urinary tract infection) 12/13/2011  . Ascites 05/18/2012    S/p paracentesis yielding 7880 mL  . Cirrhosis (HCC)   . Depression    Medications:  Prescriptions prior to admission  Medication Sig Dispense Refill Last Dose  . albuterol (PROVENTIL) (2.5 MG/3ML) 0.083% nebulizer solution Take 3 mLs (2.5 mg total) by nebulization every 6 (six) hours as needed for wheezing or shortness of breath. 75 mL 12 Past Week at Unknown time  . carisoprodol (SOMA) 350 MG tablet Take 1 tablet (350 mg total) by mouth 3 (three) times daily as needed for muscle spasms. 30 tablet 0  unknown  . diphenhydrAMINE (BENADRYL) 25 MG tablet Take 1 tablet (25 mg total) by mouth at bedtime as needed for sleep. 5 tablet 0 unknown  . furosemide (LASIX) 20 MG tablet Take 20 mg by mouth daily.   09/14/2015 at Unknown time  . Heparin Sodium, Porcine, (HEP-LOCK FLUSH IJ) Inject 5 mLs as directed 3 (three) times daily.   09/14/2015 at Unknown time  . HYDROcodone-acetaminophen (NORCO/VICODIN) 5-325 MG tablet Take 1 tablet by mouth every 6 (six) hours as needed for severe pain. 6 tablet 0 09/15/2015 at Unknown time  . hydroxypropyl methylcellulose / hypromellose (ISOPTO TEARS / GONIOVISC) 2.5 % ophthalmic solution Place 1 drop into both eyes as needed for dry eyes. Reported on 07/22/2015   Past Week at Unknown time  . ibuprofen (ADVIL,MOTRIN) 400 MG tablet Take 400 mg by mouth every 6 (six) hours as needed.   09/14/2015 at Unknown time  . lactulose (CHRONULAC) 10 GM/15ML solution Take 15 mLs by mouth 3 (three) times daily as needed.  3 09/14/2015 at Unknown time  . Menthol-Methyl Salicylate (BENGAY ARTHRITIS FORMULA EX) Apply 1 application topically 3 (three) times daily as needed.   unknown  . omeprazole (PRILOSEC) 20 MG capsule Take 20 mg by mouth daily.   09/14/2015 at Unknown time  . sodium chloride 0.9 % injection Inject 10 mLs into the vein 3 (three) times daily.   09/14/2015  at Unknown time  . spironolactone (ALDACTONE) 25 MG tablet Take 1 tablet (25 mg total) by mouth daily. 30 tablet 3 09/14/2015 at Unknown time  . Vancomycin (VANCOCIN) 750 MG/150ML SOLN Inject 150 mLs (750 mg total) into the vein every 12 (twelve) hours. 4000 mL 5 09/14/2015 at Unknown time   Assessment: 70yo male.  Pharmacy consulted to manage Coumadin for VTE prophylaxis.  INR 1.68 today. Goal of Therapy:  INR 2-3 Monitor platelets by anticoagulation protocol: Yes   Plan:  Coumadin  po today x 1 INR daily Monitor CBC, s/sx of bleeding complications  Valrie Hart A 09/24/2015,12:15 PM

## 2015-09-24 NOTE — Progress Notes (Signed)
  Subjective:  Patient states he has good appetite. He ate all of his breakfast and most of his lunch. He continues to complain of abdominal distention and pain across her upper abdomen he says pain is less than was yesterday. He states he may have passed gas through 4 times a day. According to nursing staff when he strained at times she passes large a lot of flatus. No melena or rectal bleeding reported.   Objective: Blood pressure 97/50, pulse 78, temperature 98.3 F (36.8 C), temperature source Oral, resp. rate 20, height  (1.854 m), weight 278 lb 10.6 oz (126.4 kg), SpO2 97 %. Patient is alert and does not have asterixis. Conjunctiva is pale. Sclera nonicteric.  Abdomen remains distended. He also has abdominal wall edema. Bowel sounds are present. Percussion note is very tympanitic across upper abdomen. It is softer across lower abdomen. No organomegaly or masses. Rectal examination performed with placement of Foley's catheter. Both in right and left lateral recumbent position with escape of small amount of flatus. He also has scrotal edema. Lower extremity edema is unchanged.  He is able to move toes of both feet and he has some movement at left ankle.  Urine output 3300 mL.  Labs/studies Results:   Recent Labs  09/22/15 0653 09/23/15 0541 09/24/15 0534  WBC 10.5 8.5 8.9  HGB 6.9* 7.0* 8.1*  HCT 20.6* 21.2* 23.5*  PLT 108* 101* 96*    BMET   Recent Labs  09/22/15 1341 09/23/15 0541 09/24/15 0534  NA 134* 135 136  K 3.2* 2.9* 2.9*  CL 106 105 104  CO2 GLUCOSE 132* 126* 106*  BUN 46* 45* 40*  CREATININE 2.46* 2.34* 1.98*  CALCIUM 7.2* 7.4* 7.3*     INR 1.68  Assessment:  #1. Fluid overload. Ration continues to have excellent diureses. He has put out over 7 L of urine last 24 hours but has a long way to go. He remains with massive lower extremity edema as well as abdominal wall and scrotal edema.  #2. Abdominal distention secondary to colonic  distention.patient has multiple risk factors for colonic ileus. Patient is able to pass flatus intermittently when he is turning in bed. On exam has not significantly changed in the last 24 hours. Will place rectal tube which helped him in the past.  #3. Anemia. Patient has received 2 units of PRBCs and hemoglobin now is 8.1. Hemoccults still pending.  #4. Renal failure. Patient remains with excellent diuresis and continued improvement in renal function which is reassuring he has long way to go. #4. 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 Patient begun on warfarin for PTE prophylaxis. Will have to watch very closely for evidence of GI bleed. #5.  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 There has been no recovery of function since this hospitalization. #6. Hypokalemia. Patient remains with hypokalemia despite IV boluses yesterday. He is now on oral potassium.   Recommendations;  Discontinue lactulose just when necessary order. Polyethylene glycol 17 g by mouth daily at bedtime. Rectal tube. Turn patient in bed every 3-4 hours.  Dr. Jena Gauss will be seen patient over the weekend.

## 2015-09-24 NOTE — Progress Notes (Signed)
Subjective: Patient is feeling much better. At this moment he denies any difficulty breathing. His abdominal pain also has improved. Patient does not have any nausea or vomiting.   Objective: Vital signs in last 24 hours: Temp:  [97.5 F (36.4 C)-98.3 F (36.8 C)] 98 F (36.7 C) (02/24 0629) Pulse Rate:  [73-84] 73 (02/24 0629) Resp:  [20] 20 (02/24 0629) BP: (82-110)/(41-57) 90/41 mmHg (02/24 0700) SpO2:  [97 %-100 %] 97 % (02/24 0629)  Intake/Output from previous day: 02/23 0701 - 02/24 0700 In: 815 [P.O.:480; Blood:335] Out: 3300 [Urine:3300] Intake/Output this shift:     Recent Labs  09/22/15 0653 09/23/15 0541 09/24/15 0534  HGB 6.9* 7.0* 8.1*    Recent Labs  09/23/15 0541 09/24/15 0534  WBC 8.5 8.9  RBC 2.16* 2.41*  HCT 21.2* 23.5*  PLT 101* 96*    Recent Labs  09/23/15 0541 09/24/15 0534  NA 135 136  K 2.9* 2.9*  CL 105 104  CO2 25 26  BUN 45* 40*  CREATININE 2.34* 1.98*  GLUCOSE 126* 106*  CALCIUM 7.4* 7.3*    Recent Labs  09/24/15 0534  INR 1.68*    Patient is alert and in no apparent distress Chest: Decreased breath sound bilaterally, no rales rhonchi or egophony. He doesn't have any wheezing Heart exam revealed regular rate and rhythm no murmur Abdomen: Distended, nontender and positive bowel sounds Extremities he has 2-3+ edema bilaterally.  Assessment/Plan: Problem #1 acute kidney injury: Most likely secondary to prerenal versus ATN. Presently his renal function is progressively improving. Problem #2 hypokalemia: Most likely secondary to diuretics. His potassium is 2.9 low but stable. Presently patient on potassium supplement. Problem #3 anasarca: Patient on diuretics. He has about 3300 mL of urine output and progressively improving. At this moment however his input seems to be high as patient has still significant anasarca. Problem #4 anemia: His hemoglobin is stable Problem #5 hypertension: His blood pressure is reasonably  controlled  Problem #6 history of ileus Problem #7 history of COPD Plan: We'll increase potassium to 40 mEq by mouth 3 times a day 2] patient advised to decrease his salt and fluid intake. 3] we'll check his basic metabolic panel in the morning. 4] we'll switch him to by mouth diuretics and possibly in the morning.   Gustie Bobb S 09/24/2015, 8:11 AM

## 2015-09-24 NOTE — Progress Notes (Signed)
Renal function improving patient has hypokalemia presumably secondary to progress diuresis K Dur increased to 40 mEq by mouth 3 times a day we'll put sequential  compression devices to mobilize third space fluid and lower extremities JUNIPER SNYDERS [id number] DOB: 1945-11-03 DOA: 09/15/2015 PCP: Isabella Stalling, MD             Physical Exam: Blood pressure 90/41, pulse 73, temperature 98 F (36.7 C), temperature source Oral, resp. rate 20, height  (1.854 m), weight 278 lb 10.6 oz (126.4 kg), SpO2 98 %. lungs diminished breath sounds in the bases mild end expiratory wheeze noted no rales audible heart regular rhythm no S3-S4 no heaves thrills rubs abdomen protuberant fluid wave noted   Investigations:  No results found for this or any previous visit (from the past 240 hour(s)).   Basic Metabolic Panel:  Recent Labs  02/72/53 0653  09/23/15 0541 09/24/15 0534  NA  --   < > 135 136  K  --   < > 2.9* 2.9*  CL  --   < > 105 104  CO2  --   < > 25 26  GLUCOSE  --   < > 126* 106*  BUN  --   < > 45* 40*  CREATININE  --   < > 2.34* 1.98*  CALCIUM  --   < > 7.4* 7.3*  MG 1.9  --   --   --   < > = values in this interval not displayed. Liver Function Tests: No results for input(s): AST, ALT, ALKPHOS, BILITOT, PROT, ALBUMIN in the last 72 hours.   CBC:  Recent Labs  09/23/15 0541 09/24/15 0534  WBC 8.5 8.9  HGB 7.0* 8.1*  HCT 21.2* 23.5*  MCV 98.1 97.5  PLT 101* 96*    No results found.    Medications:   Impression:  Active Problems:   HTN (hypertension)   Renal insufficiency   Debility   Cirrhosis (HCC)   Hx of substance abuse   Adynamic ileus (HCC)   Ascites   Epidural abscess   Chronic diastolic heart failure (HCC)   Ileus (HCC)   Abdominal pain     Plan: Add SCDs to mobilize third space fluid from lower extremities. KCl 40 with 40 mEq by mouth 3 times a day monitor CBC on Sunday B med daily  Consultants: Gastroenterology and  nephrology   Procedures   Antibiotics: Vancomycin                  Code Status:  Family Communication:    Disposition Plan   Time spent 30 minutes   LOS: 9 days   Laquincy Eastridge M   09/24/2015, 11:36 AM

## 2015-09-24 NOTE — Progress Notes (Signed)
Prior CSW note indicates that patient's family is wanting to complete a Living Will and POA for patient.  Chart review indicates that patient is alert to person only- thus- it is not appropriate at this time to attempt completion of these documents.  CSW will follow up with family and explain this.  Lorri Frederick. Jaci Lazier, LCSW [phone] (coverage)

## 2015-09-25 DIAGNOSIS — K598 Other specified functional intestinal disorders: Secondary | ICD-10-CM

## 2015-09-25 LAB — CBC
HCT: 24.9 % — ABNORMAL LOW (ref 39.0–52.0)
Hemoglobin: 8.3 g/dL — ABNORMAL LOW (ref 13.0–17.0)
MCH: 32.7 pg (ref 26.0–34.0)
MCHC: 33.3 g/dL (ref 30.0–36.0)
MCV: 98 fL (ref 78.0–100.0)
PLATELETS: 117 10*3/uL — AB (ref 150–400)
RBC: 2.54 MIL/uL — AB (ref 4.22–5.81)
RDW: 22 % — ABNORMAL HIGH (ref 11.5–15.5)
WBC: 9.7 10*3/uL (ref 4.0–10.5)

## 2015-09-25 LAB — BASIC METABOLIC PANEL
Anion gap: 7 (ref 5–15)
BUN: 37 mg/dL — AB (ref 6–20)
CO2: 26 mmol/L (ref 22–32)
Calcium: 7.6 mg/dL — ABNORMAL LOW (ref 8.9–10.3)
Chloride: 103 mmol/L (ref 101–111)
Creatinine, Ser: 1.86 mg/dL — ABNORMAL HIGH (ref 0.61–1.24)
GFR calc Af Amer: 42 mL/min — ABNORMAL LOW (ref >60)
GFR, EST NON AFRICAN AMERICAN: 36 mL/min — AB (ref >60)
Glucose, Bld: 89 mg/dL (ref 65–99)
POTASSIUM: 3.4 mmol/L — AB (ref 3.5–5.1)
SODIUM: 136 mmol/L (ref 135–145)

## 2015-09-25 LAB — PROTIME-INR
INR: 1.63 — ABNORMAL HIGH (ref 0.00–1.49)
PROTHROMBIN TIME: 19.4 s — AB (ref 11.6–15.2)

## 2015-09-25 MED ORDER — PANTOPRAZOLE SODIUM 40 MG PO TBEC
40.0000 mg | DELAYED_RELEASE_TABLET | Freq: Once | ORAL | Status: AC
  Administered 2015-09-25: 40 mg via ORAL
  Filled 2015-09-25: qty 1

## 2015-09-25 MED ORDER — SIMETHICONE 80 MG PO CHEW
80.0000 mg | CHEWABLE_TABLET | Freq: Once | ORAL | Status: AC
  Administered 2015-09-25: 80 mg via ORAL
  Filled 2015-09-25: qty 1

## 2015-09-25 MED ORDER — TORSEMIDE 20 MG PO TABS
60.0000 mg | ORAL_TABLET | Freq: Every day | ORAL | Status: DC
  Administered 2015-09-25 – 2015-09-28 (×4): 60 mg via ORAL
  Filled 2015-09-25 (×4): qty 3

## 2015-09-25 MED ORDER — WARFARIN SODIUM 5 MG PO TABS
5.0000 mg | ORAL_TABLET | Freq: Once | ORAL | Status: AC
  Administered 2015-09-25: 5 mg via ORAL
  Filled 2015-09-25: qty 1

## 2015-09-25 NOTE — Progress Notes (Signed)
Patient started on Coumadin yesterday onto pharmacy likewise has SCDs for DVT prophylaxis as well as mobilization of third space fluids no dyspnea renal function holding    Roberto [name] ZOX:[id number] DOB: 04-24-1946 DOA: 09/15/2015 PCP: Isabella Stalling, MD             Physical Exam: Blood pressure 92/48, pulse 82, temperature 99 F (37.2 C), temperature source Oral, resp. rate 20, height  (1.854 [name]), weight 278 lb 10.6 oz (126.4 kg), SpO2 93 %. lungs the diminished breath sounds in the bases no rales wheeze rhonchi appreciable heart regular rhythm no S3 of S4 no heaves thrills rubs   Investigations:  No results found for this or any previous visit (from the past 240 hour(s)).   Basic Metabolic Panel:  Recent Labs  81/19/14 0534 09/25/15 0614  NA 136 136  K 2.9* 3.4*  CL 104 103  CO2 26 26  GLUCOSE 106* 89  BUN 40* 37*  CREATININE 1.98* 1.86*  CALCIUM 7.3* 7.6*   Liver Function Tests: No results for input(s): AST, ALT, ALKPHOS, BILITOT, PROT, ALBUMIN in the last 72 hours.   CBC:  Recent Labs  09/24/15 0534 09/25/15 0614  WBC 8.9 9.7  HGB 8.1* 8.3*  HCT 23.5* 24.9*  MCV 97.5 98.0  PLT 96* 117*    No results found.    Medications:   Impression:  Active Problems:   HTN (hypertension)   Renal insufficiency   Debility   Cirrhosis (HCC)   Hx of substance abuse   Adynamic ileus (HCC)   Ascites   Epidural abscess   Chronic diastolic heart failure (HCC)   Ileus (HCC)   Abdominal pain     Plan: Continue Coumadin and monitor renal function continue SCDs continue vancomycin continue physical therapy    Consultants: Gastroenterology pharmacy nephrology   Procedures   Antibiotics: Vancomycin for 6 or 8 weeks                   Code Status: Full  Family Communication:   Disposition Plan see plan above  Time spent: 30 minutes    LOS: 10 days   Roberto [name]   09/25/2015, 9:59 AM

## 2015-09-25 NOTE — Progress Notes (Signed)
ANTICOAGULATION CONSULT NOTE - follow up  Pharmacy Consult for Coumadin Indication: VTE prophylaxis  No Known Allergies  Patient Measurements: Height:  (185.4 cm) Weight: 278 lb 10.6 oz (126.4 kg) (told RN) IBW/kg (Calculated) : 79.9  Vital Signs: Temp: 99 F (37.2 C) (02/25 0617) Temp Source: Oral (02/25 0617) BP: 92/48 mmHg (02/25 0837) Pulse Rate: 82 (02/25 0617)  Labs:  Recent Labs  09/23/15 0541 09/24/15 0534 09/24/15 1212 09/25/15 0614  HGB 7.0* 8.1*  --  8.3*  HCT 21.2* 23.5*  --  24.9*  PLT 101* 96*  --  117*  LABPROT  --  19.8* 18.8* 19.4*  INR  --  1.68* 1.56* 1.63*  CREATININE 2.34* 1.98*  --  1.86*   Medical History: Past Medical History  Diagnosis Date  . COPD (chronic obstructive pulmonary disease) (HCC)   . Hypertension   . Hepatic steatosis 12/14/2011  . Compression fracture of L1 lumbar vertebra (HCC) 12/14/2011  . C2 cervical fracture (HCC)   . Duodenal ulcer with hemorrhage 12/15/2011    Likely source of bleeding.per EGD; Dr. Karilyn Cota  . Esophageal varices without bleeding (HCC) 12/15/2011    per EGD  . Cirrhosis (HCC) 12/16/2011    per ultrasound  . Hx of substance abuse 12/17/2011  . 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 Hepatic encephalopathy (HCC)   . Helicobacter pylori antibody positive 12/19/2011  . Encephalopathy 05/18/2012  . UTI (urinary tract infection) 12/13/2011  . Ascites 05/18/2012    S/p paracentesis yielding 7880 mL  . Cirrhosis (HCC)   . Depression    Medications:  Prescriptions prior to admission  Medication Sig Dispense Refill Last Dose  . albuterol (PROVENTIL) (2.5 MG/3ML) 0.083% nebulizer solution Take 3 mLs (2.5 mg total) by nebulization every 6 (six) hours as needed for wheezing or shortness of breath. 75 mL 12 Past Week at Unknown time  . carisoprodol (SOMA) 350 MG tablet Take 1 tablet (350 mg total) by mouth 3 (three) times daily as needed for muscle spasms. 30 tablet 0  unknown  . diphenhydrAMINE (BENADRYL) 25 MG tablet Take 1 tablet (25 mg total) by mouth at bedtime as needed for sleep. 5 tablet 0 unknown  . furosemide (LASIX) 20 MG tablet Take 20 mg by mouth daily.   09/14/2015 at Unknown time  . Heparin Sodium, Porcine, (HEP-LOCK FLUSH IJ) Inject 5 mLs as directed 3 (three) times daily.   09/14/2015 at Unknown time  . HYDROcodone-acetaminophen (NORCO/VICODIN) 5-325 MG tablet Take 1 tablet by mouth every 6 (six) hours as needed for severe pain. 6 tablet 0 09/15/2015 at Unknown time  . hydroxypropyl methylcellulose / hypromellose (ISOPTO TEARS / GONIOVISC) 2.5 % ophthalmic solution Place 1 drop into both eyes as needed for dry eyes. Reported on 07/22/2015   Past Week at Unknown time  . ibuprofen (ADVIL,MOTRIN) 400 MG tablet Take 400 mg by mouth every 6 (six) hours as needed.   09/14/2015 at Unknown time  . lactulose (CHRONULAC) 10 GM/15ML solution Take 15 mLs by mouth 3 (three) times daily as needed.  3 09/14/2015 at Unknown time  . Menthol-Methyl Salicylate (BENGAY ARTHRITIS FORMULA EX) Apply 1 application topically 3 (three) times daily as needed.   unknown  . omeprazole (PRILOSEC) 20 MG capsule Take 20 mg by mouth daily.   09/14/2015 at Unknown time  . sodium chloride 0.9 % injection Inject 10 mLs into the vein 3 (three) times daily.   09/14/2015 at Unknown time  . spironolactone (ALDACTONE) 25  MG tablet Take 1 tablet (25 mg total) by mouth daily. 30 tablet 3 09/14/2015 at Unknown time  . Vancomycin (VANCOCIN) 750 MG/150ML SOLN Inject 150 mLs (750 mg total) into the vein every 12 (twelve) hours. 4000 mL 5 09/14/2015 at Unknown time   Assessment: 70yo male.  Pharmacy consulted to manage Coumadin for VTE prophylaxis.  Patient states he has good appetite. He ate all of his breakfast and most of his lunch. He continues to complain of abdominal distention and pain across her upper abdomen he says pain is less than was yesterday. He states he may have passed gas through 4  times a day. According to nursing staff when he strained at times she passes large a lot of flatus. No melena or rectal bleeding reported.  Goal of Therapy:  INR 2-3 Monitor platelets by anticoagulation protocol: Yes   Plan:  Coumadin  po today x 1 INR daily Monitor CBC, s/sx of bleeding complications  Margo Aye, Kenise Barraco A 09/25/2015,9:59 AM

## 2015-09-25 NOTE — Progress Notes (Signed)
Patient complains bitterly of cramps in his hands/upper extremities. Still not able to move legs. Nephrology input noted. Lasix changed to Demadex. Patient is diuresing.  Rectal tube remains in place. Discussed with nursing staff. Patient continues to be moved from side to side about every 1 hour for 30 minutes. He continues to put out some stool with these maneuvers.  Serum potassium improved from yesterday.    Vital signs in last 24 hours: Temp:  [98.3 F (36.8 C)-99 F (37.2 C)] 99 F (37.2 C) (02/25 0617) Pulse Rate:  [78-85] 82 (02/25 0617) Resp:  [20] 20 (02/25 0617) BP: (92-115)/(48-51) 92/48 mmHg (02/25 0837) SpO2:  [93 %-98 %] 93 % (02/25 0800) Last BM Date: 09/24/15 General:   Chronically ill, somewhat disheveled individual alert and conversan and cooperative in NAD Abdomen:  Distended. Bowel sounds present. Increased tympany diffusely. Some doughy abdominal wall edema Extremities:  Without clubbing or edema.    Intake/Output from previous day: 02/24 0701 - 02/25 0700 In: 840 [P.O.:840] Out: 3000 [Urine:3000] Intake/Output this shift:    Lab Results:  Recent Labs  09/23/15 0541 09/24/15 0534 09/25/15 0614  WBC 8.5 8.9 9.7  HGB 7.0* 8.1* 8.3*  HCT 21.2* 23.5* 24.9*  PLT 101* 96* 117*   BMET  Recent Labs  09/23/15 0541 09/24/15 0534 09/25/15 0614  NA 135 136 136  K 2.9* 2.9* 3.4*  CL 105 104 103  CO2 GLUCOSE 126* 106* 89  BUN 45* 40* 37*  CREATININE 2.34* 1.98* 1.86*  CALCIUM 7.4* 7.3* 7.6*   LFT No results for input(s): PROT, ALBUMIN, AST, ALT, ALKPHOS, BILITOT, BILIDIR, IBILI in the last 72 hours. 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 Patient with anasarca/renal failure. Patient is diuresing. He still has considerable third spaced fluid. Hypokalemia may be contributing to colonic dysmotility-that  has improved. Appreciate nephrology input. Don't see much neurologic recovery at this time. He does continue to put out stool with changing of position and rectal tube manipulation.  Recommendations:  Would continue present course of management for now.  If his colon has not significantly decompressed in the next 24-48 hours, would consider neostigmine treatment.

## 2015-09-25 NOTE — Progress Notes (Signed)
Subjective:  Still with discomfort secondary to distention, good appetite, but with LUQ pain, no dyspnea, nausea or vomiting  Objective: Vital signs in last 24 hours: Temp:  [98.3 F (36.8 C)-99 F (37.2 C)] 99 F (37.2 C) (02/25 0617) Pulse Rate:  [78-85] 82 (02/25 0617) Resp:  [20] 20 (02/25 0617) BP: (97-115)/(50-51) 100/50 mmHg (02/25 0617) SpO2:  [93 %-98 %] 93 % (02/25 0800) Weight change:   Intake/Output from previous day: 02/24 0701 - 02/25 0700 In: 840 [P.O.:840] Out: 3000 [Urine:3000] Intake/Output this shift:   EXAM: General appearance:  Alert, in no apparent distress Resp:  Decreased breath sounds, but clear Cardio:  RRR without murmur GI:  + BS, distended with diffuse tenderness Extremities: 2-3+ edema bilaterally  Lab Results:  Recent Labs  09/24/15 0534 09/25/15 0614  WBC 8.9 9.7  HGB 8.1* 8.3*  HCT 23.5* 24.9*  PLT 96* 117*   BMET:  Recent Labs  09/24/15 0534 09/25/15 0614  NA 136 136  K 2.9* 3.4*  CL 104 103  CO2 26 26  GLUCOSE 106* 89  BUN 40* 37*  CREATININE 1.98* 1.86*  CALCIUM 7.3* 7.6*   No results for input(s): PTH in the last 72 hours. Iron Studies: No results for input(s): IRON, TIBC, TRANSFERRIN, FERRITIN in the last 72 hours.  Studies/Results: No results found.   Assessment/Plan: Problem #1 acute kidney injury: Most likely secondary to prerenal versus ATN. Presently his renal function is progressively improving. Problem #2 hypokalemia: Most likely secondary to diuretics. His potassium is improving, now 3.4, on potassium supplement. Problem #3 anasarca: Patient on IV Lasix. His net I/O is 3600 mL of urine output and progressively improving, but still significant anasarca. Problem #4 anemia: His hemoglobin is stable. Problem #5 hypertension: His blood pressure is reasonably controlled.  Problem #6 history of ileus Problem #7 history of COPD  Plan:  DC Lasix and start Demedex 60 mg PO qd.           Renal panel & CBC  tomorrow.   LOS: 10 days   LYLES,CHARLES 09/25/2015,8:27 AM

## 2015-09-26 ENCOUNTER — Inpatient Hospital Stay (HOSPITAL_COMMUNITY): Payer: Medicare Other

## 2015-09-26 LAB — RENAL FUNCTION PANEL
Albumin: 2 g/dL — ABNORMAL LOW (ref 3.5–5.0)
Anion gap: 5 (ref 5–15)
BUN: 37 mg/dL — ABNORMAL HIGH (ref 6–20)
CALCIUM: 7.6 mg/dL — AB (ref 8.9–10.3)
CO2: 28 mmol/L (ref 22–32)
CREATININE: 1.97 mg/dL — AB (ref 0.61–1.24)
Chloride: 104 mmol/L (ref 101–111)
GFR calc Af Amer: 39 mL/min — ABNORMAL LOW (ref >60)
GFR calc non Af Amer: 33 mL/min — ABNORMAL LOW (ref >60)
GLUCOSE: 96 mg/dL (ref 65–99)
Phosphorus: 2.9 mg/dL (ref 2.5–4.6)
Potassium: 4.3 mmol/L (ref 3.5–5.1)
SODIUM: 137 mmol/L (ref 135–145)

## 2015-09-26 LAB — CBC
HCT: 24.8 % — ABNORMAL LOW (ref 39.0–52.0)
Hemoglobin: 8.2 g/dL — ABNORMAL LOW (ref 13.0–17.0)
MCH: 32.9 pg (ref 26.0–34.0)
MCHC: 33.1 g/dL (ref 30.0–36.0)
MCV: 99.6 fL (ref 78.0–100.0)
PLATELETS: 124 10*3/uL — AB (ref 150–400)
RBC: 2.49 MIL/uL — ABNORMAL LOW (ref 4.22–5.81)
RDW: 21.9 % — AB (ref 11.5–15.5)
WBC: 9.8 10*3/uL (ref 4.0–10.5)

## 2015-09-26 LAB — PROTIME-INR
INR: 1.78 — AB (ref 0.00–1.49)
Prothrombin Time: 20.6 seconds — ABNORMAL HIGH (ref 11.6–15.2)

## 2015-09-26 MED ORDER — POTASSIUM CHLORIDE CRYS ER 20 MEQ PO TBCR
40.0000 meq | EXTENDED_RELEASE_TABLET | Freq: Every day | ORAL | Status: DC
Start: 2015-09-26 — End: 2015-09-30
  Administered 2015-09-26 – 2015-09-30 (×5): 40 meq via ORAL
  Filled 2015-09-26 (×4): qty 2

## 2015-09-26 MED ORDER — PANTOPRAZOLE SODIUM 40 MG PO TBEC
40.0000 mg | DELAYED_RELEASE_TABLET | Freq: Two times a day (BID) | ORAL | Status: DC
  Administered 2015-09-26 – 2015-09-30 (×8): 40 mg via ORAL
  Filled 2015-09-26 (×8): qty 1

## 2015-09-26 MED ORDER — WARFARIN SODIUM 5 MG PO TABS
5.0000 mg | ORAL_TABLET | Freq: Once | ORAL | Status: AC
  Administered 2015-09-26: 5 mg via ORAL
  Filled 2015-09-26: qty 1

## 2015-09-26 NOTE — Progress Notes (Signed)
ANTICOAGULATION CONSULT NOTE - follow up  Pharmacy Consult for Coumadin Indication: VTE prophylaxis  No Known Allergies  Patient Measurements: Height:  (185.4 cm) Weight: 278 lb 10.6 oz (126.4 kg) (told RN) IBW/kg (Calculated) : 79.9  Vital Signs: Temp: 98.3 F (36.8 C) (02/26 0545) Temp Source: Oral (02/26 0545) BP: 98/46 mmHg (02/26 0545) Pulse Rate: 78 (02/26 0545)  Labs:  Recent Labs  09/24/15 0534 09/24/15 1212 09/25/15 0614 09/26/15 0542  HGB 8.1*  --  8.3* 8.2*  HCT 23.5*  --  24.9* 24.8*  PLT 96*  --  117* 124*  LABPROT 19.8* 18.8* 19.4* 20.6*  INR 1.68* 1.56* 1.63* 1.78*  CREATININE 1.98*  --  1.86* 1.97*   Medical History: Past Medical History  Diagnosis Date  . COPD (chronic obstructive pulmonary disease) (HCC)   . Hypertension   . Hepatic steatosis 12/14/2011  . Compression fracture of L1 lumbar vertebra (HCC) 12/14/2011  . C2 cervical fracture (HCC)   . Duodenal ulcer with hemorrhage 12/15/2011    Likely source of bleeding.per EGD; Dr. Karilyn Cota  . Esophageal varices without bleeding (HCC) 12/15/2011    per EGD  . Cirrhosis (HCC) 12/16/2011    per ultrasound  . Hx of substance abuse 12/17/2011  . 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 Hepatic encephalopathy (HCC)   . Helicobacter pylori antibody positive 12/19/2011  . Encephalopathy 05/18/2012  . UTI (urinary tract infection) 12/13/2011  . Ascites 05/18/2012    S/p paracentesis yielding 7880 mL  . Cirrhosis (HCC)   . Depression    Medications:  Prescriptions prior to admission  Medication Sig Dispense Refill Last Dose  . albuterol (PROVENTIL) (2.5 MG/3ML) 0.083% nebulizer solution Take 3 mLs (2.5 mg total) by nebulization every 6 (six) hours as needed for wheezing or shortness of breath. 75 mL 12 Past Week at Unknown time  . carisoprodol (SOMA) 350 MG tablet Take 1 tablet (350 mg total) by mouth 3 (three) times daily as needed for muscle spasms. 30 tablet 0  unknown  . diphenhydrAMINE (BENADRYL) 25 MG tablet Take 1 tablet (25 mg total) by mouth at bedtime as needed for sleep. 5 tablet 0 unknown  . furosemide (LASIX) 20 MG tablet Take 20 mg by mouth daily.   09/14/2015 at Unknown time  . Heparin Sodium, Porcine, (HEP-LOCK FLUSH IJ) Inject 5 mLs as directed 3 (three) times daily.   09/14/2015 at Unknown time  . HYDROcodone-acetaminophen (NORCO/VICODIN) 5-325 MG tablet Take 1 tablet by mouth every 6 (six) hours as needed for severe pain. 6 tablet 0 09/15/2015 at Unknown time  . hydroxypropyl methylcellulose / hypromellose (ISOPTO TEARS / GONIOVISC) 2.5 % ophthalmic solution Place 1 drop into both eyes as needed for dry eyes. Reported on 07/22/2015   Past Week at Unknown time  . ibuprofen (ADVIL,MOTRIN) 400 MG tablet Take 400 mg by mouth every 6 (six) hours as needed.   09/14/2015 at Unknown time  . lactulose (CHRONULAC) 10 GM/15ML solution Take 15 mLs by mouth 3 (three) times daily as needed.  3 09/14/2015 at Unknown time  . Menthol-Methyl Salicylate (BENGAY ARTHRITIS FORMULA EX) Apply 1 application topically 3 (three) times daily as needed.   unknown  . omeprazole (PRILOSEC) 20 MG capsule Take 20 mg by mouth daily.   09/14/2015 at Unknown time  . sodium chloride 0.9 % injection Inject 10 mLs into the vein 3 (three) times daily.   09/14/2015 at Unknown time  . spironolactone (ALDACTONE) 25 MG tablet Take 1  tablet (25 mg total) by mouth daily. 30 tablet 3 09/14/2015 at Unknown time  . Vancomycin (VANCOCIN) 750 MG/150ML SOLN Inject 150 mLs (750 mg total) into the vein every 12 (twelve) hours. 4000 mL 5 09/14/2015 at Unknown time   Assessment: 70yo male.  Pharmacy consulted to manage Coumadin for VTE prophylaxis.  INR is trending up toward goal. Patient states he has good appetite. He ate all of his breakfast and most of his lunch. He continues to complain of abdominal distention and pain across her upper abdomen he says pain is less than was yesterday. He states he  may have passed gas through 4 times a day. According to nursing staff when he strained at times she passes large a lot of flatus. No melena or rectal bleeding reported.  Goal of Therapy:  INR 2-3 Monitor platelets by anticoagulation protocol: Yes   Plan:  Coumadin  po today x 1 INR daily Monitor CBC, s/sx of bleeding complications  Margo Aye, Zyion Leidner A 09/26/2015,11:59 AM

## 2015-09-26 NOTE — Progress Notes (Signed)
Asked to assess PICC line. PICC was found to have been pulled out 15cm with insertion site uncovered. Dressing in place below the insertion site. Recommend putting dressing over insertion site to keep PICC from being pulled out further andgetting infected. Vascular access team should be able to do an exchange.

## 2015-09-26 NOTE — Progress Notes (Signed)
Patient states some reflux symptoms got much worse this morning when he tried to eat a fast food sausage biscuit. Still with abdominal distention. Discussed with nursing staff. Patient position changes continue. Some flatus,  very little stool overnight. Back on warfarin. No bleeding issues thus far.   Vital signs in last 24 hours: Temp:  [98.3 F (36.8 C)-98.6 F (37 C)] 98.3 F (36.8 C) (02/26 0545) Pulse Rate:  [75-78] 78 (02/26 0545) Resp:  [18-20] 20 (02/26 0545) BP: (95-100)/(44-48) 98/46 mmHg (02/26 0545) SpO2:  [94 %-97 %] 97 % (02/26 0810) Last BM Date: 09/25/15 General:   Chronically ill appearing pleasant and cooperative in NAD Abdomen:  Remains distended.  Bowel sounds present. Somewhat less tympany than noted yesterday.  Mild diffuse tenderness (left side greater than right)   Intake/Output from previous day: 02/25 0701 - 02/26 0700 In: -  Out: 3050 [Urine:3050] Intake/Output this shift: Total I/O In: -  Out: 300 [Urine:300]  Lab Results:  Recent Labs  09/24/15 0534 09/25/15 0614 09/26/15 0542  WBC 8.9 9.7 9.8  HGB 8.1* 8.3* 8.2*  HCT 23.5* 24.9* 24.8*  PLT 96* 117* 124*   BMET  Recent Labs  09/24/15 0534 09/25/15 0614 09/26/15 0542  NA 136 136 137  K 2.9* 3.4* 4.3  CL 104 103 104  CO2 GLUCOSE 106* 89 96  BUN 40* 37* 37*  CREATININE 1.98* 1.86* 1.97*  CALCIUM 7.3* 7.6* 7.6*   LFT  Recent Labs  09/26/15 0542  ALBUMIN 2.0*   PT/INR  Recent Labs  09/25/15 0614 09/26/15 0542  LABPROT 19.4* 20.6*  INR 1.63* 1.78*     Impression:   Anasarca/ascites/colonic ileus. Abdominal distention persists but a little less tympanitic by my exam today. Chronic renal failure-stable. Anemia stable.  His abdomen remains significantly distended. He may have less intraluminal air -   Plain films will help sort that out. Need to assess cecal diameter as well to see if he would be a candidate for neostigmine treatment. Hypokalemia resolved.   Probable flare of reflux this morning in spite of once daily PPI.   Recommendations:  As discussed with nursing staff, I have asked that patient continue to be moving from side to side per schedule.  Continue diuresis per nephrology  Check plain films today. For now, will increase PPI to twice a day therapy

## 2015-09-26 NOTE — Progress Notes (Signed)
Renal function is leveling off patient back on Coumadin as well as SCDs subtherapeutic INR at present plans of abdominal discomfort postprandially. Roberto [name] [id number] DOB: 1946/07/14 DOA: 09/15/2015 PCP: Roberto Stalling, MD             Physical Exam: Blood pressure 98/46, pulse 78, temperature 98.3 F (36.8 C), temperature source Oral, resp. rate 20, height  (1.854 [name]), weight 278 lb 10.6 oz (126.4 kg), SpO2 97 %. lungs clear to A&P diminished breath sounds in the bases heart regular rhythm no S3-S4 no heaves thrills rubs abdomen no peristaltic rushes no guarding or rebound masses no megaly   Investigations:  No results found for this or any previous visit (from the past 240 hour(s)).   Basic Metabolic Panel:  Recent Labs  57/84/69 0614 09/26/15 0542  NA 136 137  K 3.4* 4.3  CL 103 104  CO2 26 28  GLUCOSE 89 96  BUN 37* 37*  CREATININE 1.86* 1.97*  CALCIUM 7.6* 7.6*  PHOS  --  2.9   Liver Function Tests:  Recent Labs  09/26/15 0542  ALBUMIN 2.0*     CBC:  Recent Labs  09/25/15 0614 09/26/15 0542  WBC 9.7 9.8  HGB 8.3* 8.2*  HCT 24.9* 24.8*  MCV 98.0 99.6  PLT 117* 124*    No results found.    Medications:  Impression:  Active Problems:   HTN (hypertension)   Renal insufficiency   Debility   Cirrhosis (HCC)   Hx of substance abuse   Adynamic ileus (HCC)   Ascites   Epidural abscess   Chronic diastolic heart failure (HCC)   Ileus (HCC)   Abdominal pain     Plan:    Consultants: Gastroenterology nephrology pharmacy   Procedures   Antibiotics: Vancomycin                  Code Status: Full  Family Communication:  Spoke with patient at length  Disposition Plan see plan above  Time spent: 30 minutes   LOS: 11 days   Roberto [name]   09/26/2015, 11:46 AM

## 2015-09-26 NOTE — Progress Notes (Signed)
MD ordered to increase  protonix to BID. RN put in order. Lesly Dukes, RN

## 2015-09-26 NOTE — Progress Notes (Signed)
Pt pulled PICC line out 15 cm. RN made patient aware to not scratch at PICC line or mess with it. RN made [facility] aware and called vascular team contacted. New PICC was placed. Dressing clean and saline locked Lesly Dukes, RN

## 2015-09-26 NOTE — Progress Notes (Signed)
Subjective:  Drinking coffee and eating a biscuit from [employer], but with LUQ abdominal pain, no nausea or vomiting, breathing well after nebulizer  Objective: Vital signs in last 24 hours: Temp:  [98.3 F (36.8 C)-98.6 F (37 C)] 98.3 F (36.8 C) (02/26 0545) Pulse Rate:  [75-78] 78 (02/26 0545) Resp:  [18-20] 20 (02/26 0545) BP: (92-100)/(44-48) 98/46 mmHg (02/26 0545) SpO2:  [94 %-97 %] 97 % (02/26 0810) Weight change:   Intake/Output from previous day: 02/25 0701 - 02/26 0700 In: -  Out: 3050 [Urine:3050] Intake/Output this shift:   EXAM: General appearance: Alert, in no apparent distress Resp: Decreased breath sounds, but clear Cardio: RRR without murmur GI: + BS, distended with LUQ tenderness Extremities: 2-3+ edema bilaterally  Lab Results:  Recent Labs  09/25/15 0614 09/26/15 0542  WBC 9.7 9.8  HGB 8.3* 8.2*  HCT 24.9* 24.8*  PLT 117* 124*   BMET:  Recent Labs  09/25/15 0614 09/26/15 0542  NA 136 137  K 3.4* 4.3  CL 103 104  CO2 26 28  GLUCOSE 89 96  BUN 37* 37*  CREATININE 1.86* 1.97*  CALCIUM 7.6* 7.6*  ALBUMIN  --  2.0*   No results for input(s): PTH in the last 72 hours. Iron Studies: No results for input(s): IRON, TIBC, TRANSFERRIN, FERRITIN in the last 72 hours.  Studies/Results: No results found.   Assessment/Plan: 1. Acute kidney injury - Most likely secondary to prerenal vs ATN; renal function has stabilized with Cr 1.97, asymptomatic.. 2. Hypokalemia - Most likely secondary to diuretics, improved, K up to 4.3 on potassium supplement 40 mEq tid. 3. Anasarca - Changed from IV Lasix to PO Demedex 60 mg qd yesterday; 24-hr net I/O was -3000 mL, progressively improving, but still significant anasarca. 4. Anemia - His hemoglobin is stable at 8.2. 5. HTN - His blood pressure is well controlled.  6. History of ileus 7. History of COPD   Plan:  Reduce potassium supplement to 40 mEq PO qd.           Renal panel and CBC tomorrow  AM.   LOS: 11 days   LYLES,CHARLES 09/26/2015,8:11 AM  Patient examined and agreed with the above treatment.

## 2015-09-27 LAB — BASIC METABOLIC PANEL
Anion gap: 8 (ref 5–15)
BUN: 37 mg/dL — AB (ref 6–20)
CALCIUM: 7.6 mg/dL — AB (ref 8.9–10.3)
CO2: 28 mmol/L (ref 22–32)
CREATININE: 1.77 mg/dL — AB (ref 0.61–1.24)
Chloride: 101 mmol/L (ref 101–111)
GFR calc Af Amer: 44 mL/min — ABNORMAL LOW (ref >60)
GFR, EST NON AFRICAN AMERICAN: 38 mL/min — AB (ref >60)
GLUCOSE: 94 mg/dL (ref 65–99)
Potassium: 4 mmol/L (ref 3.5–5.1)
Sodium: 137 mmol/L (ref 135–145)

## 2015-09-27 LAB — HEPATIC FUNCTION PANEL
ALT: 29 U/L (ref 17–63)
AST: 70 U/L — ABNORMAL HIGH (ref 15–41)
Albumin: 2 g/dL — ABNORMAL LOW (ref 3.5–5.0)
Alkaline Phosphatase: 78 U/L (ref 38–126)
BILIRUBIN DIRECT: 0.4 mg/dL (ref 0.1–0.5)
BILIRUBIN INDIRECT: 0.7 mg/dL (ref 0.3–0.9)
Total Bilirubin: 1.1 mg/dL (ref 0.3–1.2)
Total Protein: 5.7 g/dL — ABNORMAL LOW (ref 6.5–8.1)

## 2015-09-27 LAB — PROTIME-INR
INR: 2.18 — ABNORMAL HIGH (ref 0.00–1.49)
Prothrombin Time: 24.1 seconds — ABNORMAL HIGH (ref 11.6–15.2)

## 2015-09-27 LAB — CBC
HCT: 26.5 % — ABNORMAL LOW (ref 39.0–52.0)
Hemoglobin: 8.9 g/dL — ABNORMAL LOW (ref 13.0–17.0)
MCH: 33.6 pg (ref 26.0–34.0)
MCHC: 33.6 g/dL (ref 30.0–36.0)
MCV: 100 fL (ref 78.0–100.0)
PLATELETS: 134 10*3/uL — AB (ref 150–400)
RBC: 2.65 MIL/uL — AB (ref 4.22–5.81)
RDW: 21.9 % — AB (ref 11.5–15.5)
WBC: 9.9 10*3/uL (ref 4.0–10.5)

## 2015-09-27 MED ORDER — SUCRALFATE 1 GM/10ML PO SUSP
1.0000 g | Freq: Three times a day (TID) | ORAL | Status: DC
  Administered 2015-09-27 – 2015-09-30 (×13): 1 g via ORAL
  Filled 2015-09-27 (×14): qty 10

## 2015-09-27 MED ORDER — WARFARIN SODIUM 5 MG PO TABS
2.5000 mg | ORAL_TABLET | Freq: Once | ORAL | Status: AC
  Administered 2015-09-27: 2.5 mg via ORAL
  Filled 2015-09-27: qty 1

## 2015-09-27 NOTE — Progress Notes (Signed)
  Subjective:  Patient states he has good appetite unable to eat because he develops pain in epigastrium. on eating. He denies vomiting. He is passing flatus. He had one bowel movement yesterday. No melena or rectal bleeding reported.     Objective: Blood pressure 88/48, pulse 85, temperature 98.4 F (36.9 C), temperature source Oral, resp. rate 20, height  (1.854 m), weight 266 lb 5.1 oz (120.8 kg), SpO2 94 %. Patient is alert and does not have asterixis. Conjunctiva was pale. Sclerae nonicteric.  Abdomen is less distended since last examined. Edema across lower abdominal wall is less pronounced. Bowel sounds are normal. On palpation he has mild midepigastric tenderness. For half abdomen is very soft. Scrotal edema has decreased. Lower extremity edema is less as evidenced by fine wrinkling to skin.  He is able to move toes and both feet anemia slight movement at left ankle.  Urine output 3900 mL last 24 hours.  Labs/studies Results:   Recent Labs  09/25/15 0614 09/26/15 0542 09/27/15 0548  WBC 9.7 9.8 9.9  HGB 8.3* 8.2* 8.9*  HCT 24.9* 24.8* 26.5*  PLT 117* 124* 134*    BMET   Recent Labs  09/25/15 0614 09/26/15 0542 09/27/15 0548  NA 136 137 137  K 3.4* 4.3 4.0  CL 103 104 101  CO2 GLUCOSE 89 96 94  BUN 37* 37* 37*  CREATININE 1.86* 1.97* 1.77*  CALCIUM 7.6* 7.6* 7.6*     INR 2.18   Assessment:  #1. Fluid overload. Patient remains with excellent diureses. Lower extremity and scrotal edema has decreased but he has long way to go.  #2. Abdominal distention. Abdominal distention is decreased significantly. Etiology primarily due to colonic distention. Abdominal film from yesterday reveals significant improvement. He also has ascites which is felt to be small.  #3. Anemia. H&H is gradually coming up. He received 2 units of PRBCs last week. Hemoccult is still pending but no evidence of melena or rectal bleeding.  #4. Renal failure. Renal function  continues to improve. #4. D compensated alcoholic liver disease. He has known alcoholic cirrhosis complicated by ascites thrombocytopenia and mild coagulopathy. #5.  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 There has been no recovery of function since this hospitalization. #6. Coagulopathy. He is now on warfarin for VTE prophylaxis. Will need to monitor INR closely as he is likely to overshoot. #7. Postprandial epigastric pain. He is at risk for peptic ulcer disease as well as GERD. Pantoprazole dose was doubled over the weekend. He may benefit with addition of sucralfate.  Recommendations;  Sucralfate 1 g by mouth before meals and daily at bedtime. Will check LFTs.  Please note patient's condition was discussed with his son [name] and daughter-in-law Marcelino Duster yesterday.

## 2015-09-27 NOTE — Progress Notes (Signed)
Present for emotional and spiritual support. Discussed his support-family and faith, along with issues of coping during illness. Prayed with him as he continues to deal with pain and health changes.

## 2015-09-27 NOTE — Progress Notes (Signed)
Subjective: Patient is complaining of some abdominal pain but no nausea or vomiting. The pain is epigastric and sharp. Patient denies any diarrhea. Patient has moved his bowel last night. Patient presently denies any difficulty breathing.  Objective: Vital signs in last 24 hours: Temp:  [97.8 F (36.6 C)-99.2 F (37.3 C)] 98.4 F (36.9 C) (02/27 0557) Pulse Rate:  [85-100] 85 (02/27 0557) Resp:  [17-20] 20 (02/27 0557) BP: (88-113)/(47-66) 88/48 mmHg (02/27 0557) SpO2:  [94 %-98 %] 94 % (02/27 0557)  Intake/Output from previous day: 02/26 0701 - 02/27 0700 In: 720 [P.O.:720] Out: 3900 [Urine:3900] Intake/Output this shift:     Recent Labs  09/25/15 0614 09/26/15 0542 09/27/15 0548  HGB 8.3* 8.2* 8.9*    Recent Labs  09/26/15 0542 09/27/15 0548  WBC 9.8 9.9  RBC 2.49* 2.65*  HCT 24.8* 26.5*  PLT 124* 134*    Recent Labs  09/26/15 0542 09/27/15 0548  NA 137 137  K 4.3 4.0  CL 104 101  CO2 28 28  BUN 37* 37*  CREATININE 1.97* 1.77*  GLUCOSE 96 94  CALCIUM 7.6* 7.6*    Recent Labs  09/26/15 0542 09/27/15 0548  INR 1.78* 2.18*    Patient is alert and in no apparent distress Chest: Decreased breath sound bilaterally, no rales rhonchi or egophony. He doesn't have any wheezing Heart exam revealed regular rate and rhythm no murmur Abdomen: Distended, nontender and positive bowel sounds Extremities he has 2+ edema bilaterally.  Assessment/Plan: Problem #1 acute kidney injury: Most likely secondary to prerenal versus ATN. His BUN and creatinine is continuously improving. Problem #2 hypokalemia: Most likely secondary to diuretics.  Presently patient on potassium 40 mEq by mouth once a day and his potassium is normal. Problem #3 anasarca: Patient on diuretics. He has about 3900 cc of urine output. Patient on Demadex and Aldactone. His anasarca seems to be improving. Problem #4 anemia: His hemoglobin is low but improving. Problem #5 hypertension: His blood  pressure is reasonably controlled  Problem #6 history of ileus: Patient is moving his bowels but complains of some abdominal pain and asking for pain medication. Problem #7 history of COPD Plan: 1] we'll continue his present management 2] we'll check his renal panel in the morning.   Seila Liston S 09/27/2015, 8:08 AM

## 2015-09-27 NOTE — Progress Notes (Signed)
ANTICOAGULATION CONSULT NOTE - follow up  Pharmacy Consult for Coumadin Indication: VTE prophylaxis  No Known Allergies  Patient Measurements: Height:  (185.4 cm) Weight: 266 lb 5.1 oz (120.8 kg) IBW/kg (Calculated) : 79.9  Vital Signs: Temp: 98.4 F (36.9 C) (02/27 0557) Temp Source: Oral (02/27 0557) BP: 88/48 mmHg (02/27 0557) Pulse Rate: 85 (02/27 0557)  Labs:  Recent Labs  09/25/15 0614 09/26/15 0542 09/27/15 0548  HGB 8.3* 8.2* 8.9*  HCT 24.9* 24.8* 26.5*  PLT 117* 124* 134*  LABPROT 19.4* 20.6* 24.1*  INR 1.63* 1.78* 2.18*  CREATININE 1.86* 1.97* 1.77*   Medical History: Past Medical History  Diagnosis Date  . COPD (chronic obstructive pulmonary disease) (HCC)   . Hypertension   . Hepatic steatosis 12/14/2011  . Compression fracture of L1 lumbar vertebra (HCC) 12/14/2011  . C2 cervical fracture (HCC)   . Duodenal ulcer with hemorrhage 12/15/2011    Likely source of bleeding.per EGD; Dr. Karilyn Cota  . Esophageal varices without bleeding (HCC) 12/15/2011    per EGD  . Cirrhosis (HCC) 12/16/2011    per ultrasound  . Hx of substance abuse 12/17/2011  . 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 Hepatic encephalopathy (HCC)   . Helicobacter pylori antibody positive 12/19/2011  . Encephalopathy 05/18/2012  . UTI (urinary tract infection) 12/13/2011  . Ascites 05/18/2012    S/p paracentesis yielding 7880 mL  . Cirrhosis (HCC)   . Depression    Medications:  Prescriptions prior to admission  Medication Sig Dispense Refill Last Dose  . albuterol (PROVENTIL) (2.5 MG/3ML) 0.083% nebulizer solution Take 3 mLs (2.5 mg total) by nebulization every 6 (six) hours as needed for wheezing or shortness of breath. 75 mL 12 Past Week at Unknown time  . carisoprodol (SOMA) 350 MG tablet Take 1 tablet (350 mg total) by mouth 3 (three) times daily as needed for muscle spasms. 30 tablet 0 unknown  . diphenhydrAMINE (BENADRYL) 25 MG tablet Take  1 tablet (25 mg total) by mouth at bedtime as needed for sleep. 5 tablet 0 unknown  . furosemide (LASIX) 20 MG tablet Take 20 mg by mouth daily.   09/14/2015 at Unknown time  . Heparin Sodium, Porcine, (HEP-LOCK FLUSH IJ) Inject 5 mLs as directed 3 (three) times daily.   09/14/2015 at Unknown time  . HYDROcodone-acetaminophen (NORCO/VICODIN) 5-325 MG tablet Take 1 tablet by mouth every 6 (six) hours as needed for severe pain. 6 tablet 0 09/15/2015 at Unknown time  . hydroxypropyl methylcellulose / hypromellose (ISOPTO TEARS / GONIOVISC) 2.5 % ophthalmic solution Place 1 drop into both eyes as needed for dry eyes. Reported on 07/22/2015   Past Week at Unknown time  . ibuprofen (ADVIL,MOTRIN) 400 MG tablet Take 400 mg by mouth every 6 (six) hours as needed.   09/14/2015 at Unknown time  . lactulose (CHRONULAC) 10 GM/15ML solution Take 15 mLs by mouth 3 (three) times daily as needed.  3 09/14/2015 at Unknown time  . Menthol-Methyl Salicylate (BENGAY ARTHRITIS FORMULA EX) Apply 1 application topically 3 (three) times daily as needed.   unknown  . omeprazole (PRILOSEC) 20 MG capsule Take 20 mg by mouth daily.   09/14/2015 at Unknown time  . sodium chloride 0.9 % injection Inject 10 mLs into the vein 3 (three) times daily.   09/14/2015 at Unknown time  . spironolactone (ALDACTONE) 25 MG tablet Take 1 tablet (25 mg total) by mouth daily. 30 tablet 3 09/14/2015 at Unknown time  . Vancomycin (VANCOCIN)  750 MG/150ML SOLN Inject 150 mLs (750 mg total) into the vein every 12 (twelve) hours. 4000 mL 5 09/14/2015 at Unknown time   Assessment: 70yo male.  Pharmacy consulted to manage Coumadin for VTE prophylaxis.  INR is therapeutic and trending up. Patient states he has good appetite unable to eat because he develops pain in epigastrium. on eating. He denies vomiting. He is passing flatus. He had one bowel movement yesterday. No melena or rectal bleeding reported.  Goal of Therapy:  INR 2-3 Monitor platelets by  anticoagulation protocol: Yes   Plan:  Coumadin 2.5mg  po today x 1 INR daily Monitor CBC, s/sx of bleeding complications  Margo Aye, Rashad Auld A 09/27/2015,11:18 AM

## 2015-09-27 NOTE — Progress Notes (Signed)
Albumin 2.1 routine 5.7 INR 2.18 on Coumadin renal function improving with Lasix and potassium chloride SCDs in place to mobilize third space fluids Roberto [name] [id number] DOB: Dec 26, 1945 DOA: 09/15/2015 PCP: Isabella Stalling, MD             Physical Exam: Blood pressure 88/48, pulse 85, temperature 98.4 F (36.9 C), temperature source Oral, resp. rate 20, height  (1.854 m), weight 266 lb 5.1 oz (120.8 kg), SpO2 94 %. lungs diminished breath sounds in the bases no rales wheeze rhonchi appreciable heart regular rhythm no murmurs goes rubs soft nontender bowel sounds normoactive    Investigations:  No results found for this or any previous visit (from the past 240 hour(s)).   Basic Metabolic Panel:  Recent Labs  88/41/66 0542 09/27/15 0548  NA 137 137  K 4.3 4.0  CL 104 101  CO2 28 28  GLUCOSE 96 94  BUN 37* 37*  CREATININE 1.97* 1.77*  CALCIUM 7.6* 7.6*  PHOS 2.9  --    Liver Function Tests:  Recent Labs  09/26/15 0542 09/27/15 0548  AST  --  70*  ALT  --  29  ALKPHOS  --  78  BILITOT  --  1.1  PROT  --  5.7*  ALBUMIN 2.0* 2.0*     CBC:  Recent Labs  09/26/15 0542 09/27/15 0548  WBC 9.8 9.9  HGB 8.2* 8.9*  HCT 24.8* 26.5*  MCV 99.6 100.0  PLT 124* 134*    Dg Chest 1 View  09/26/2015  CLINICAL DATA:  Status post PICC line placement. EXAM: CHEST 1 VIEW COMPARISON:  Chest radiograph 09/15/2015. FINDINGS: Right upper extremity PICC line is present with tip projecting over the superior vena cava. Stable cardiac and mediastinal contours. Low lung volumes. Bilateral lower lung heterogeneous opacities. No pleural effusion or pneumothorax. Midline surgical staple line. IMPRESSION: Right upper extremity PICC line tip projects over the superior cavoatrial junction. Basilar airspace opacities may represent atelectasis or infection. Electronically Signed   By: Annia Belt M.D.   On: 09/26/2015 15:42   Dg Abd 2 Views  09/26/2015  CLINICAL DATA:   Abdominal distention. Evaluate ileus and cecal diameter. EXAM: ABDOMEN - 2 VIEW COMPARISON:  09/15/2015 FINDINGS: Improving gaseous distention of bowel. Gas within mildly prominent small bowel loops in the left abdomen an nondistended colon. Cecum not currently visualized or measurable. No free air organomegaly. IMPRESSION: Improving gaseous distention of bowel. No current evidence of obstruction or ileus. Electronically Signed   By: Charlett Nose M.D.   On: 09/26/2015 14:09      Medications:  Impression:  Active Problems:   HTN (hypertension)   Renal insufficiency   Debility   Cirrhosis (HCC)   Hx of substance abuse   Adynamic ileus (HCC)   Ascites   Epidural abscess   Chronic diastolic heart failure (HCC)   Ileus (HCC)   Abdominal pain     Plan: Continue IV vancomycin monitor renal function in a.m. electrolytes monitor INR in a.m. continue SCDs to mobilize third space fluids  Consultants:     Procedures   Antibiotics: IV vancomycin                   Code Status: Full   Family Communication:    Disposition Plan see plan above  Time spent: 30 minutes   LOS: 12 days   Serena Petterson M   09/27/2015, 12:25 PM

## 2015-09-28 LAB — PROTIME-INR
INR: 2.49 — ABNORMAL HIGH (ref 0.00–1.49)
Prothrombin Time: 26.6 seconds — ABNORMAL HIGH (ref 11.6–15.2)

## 2015-09-28 LAB — VANCOMYCIN, TROUGH: Vancomycin Tr: 20 ug/mL (ref 10.0–20.0)

## 2015-09-28 MED ORDER — SODIUM CHLORIDE 0.9 % IV SOLN
INTRAVENOUS | Status: DC
  Administered 2015-09-28 (×2): via INTRAVENOUS

## 2015-09-28 MED ORDER — FUROSEMIDE 10 MG/ML IJ SOLN
60.0000 mg | Freq: Two times a day (BID) | INTRAMUSCULAR | Status: DC
  Administered 2015-09-28 – 2015-09-30 (×5): 60 mg via INTRAVENOUS
  Filled 2015-09-28 (×5): qty 6

## 2015-09-28 MED ORDER — WARFARIN SODIUM 5 MG PO TABS
2.5000 mg | ORAL_TABLET | Freq: Once | ORAL | Status: AC
  Administered 2015-09-28: 2.5 mg via ORAL
  Filled 2015-09-28: qty 1

## 2015-09-28 NOTE — Progress Notes (Signed)
Subjective: Patient seen complaints of some abdominal pain and asking me for pain medication. He denies any nausea or vomiting. Denies also any difficulty breathing.   Objective: Vital signs in last 24 hours: Temp:  [98.1 F (36.7 C)-98.2 F (36.8 C)] 98.2 F (36.8 C) (02/28 0602) Pulse Rate:  [78-82] 82 (02/28 0602) Resp:  [20] 20 (02/28 0602) BP: (88-112)/(50-63) 112/63 mmHg (02/28 0602) SpO2:  [94 %-96 %] 94 % (02/28 0602) Weight:  [266 lb 5.1 oz (120.8 kg)] 266 lb 5.1 oz (120.8 kg) (02/27 1025)  Intake/Output from previous day: 02/27 0701 - 02/28 0700 In: 480 [P.O.:480] Out: 4600 [Urine:4600] Intake/Output this shift:     Recent Labs  09/26/15 0542 09/27/15 0548  HGB 8.2* 8.9*    Recent Labs  09/26/15 0542 09/27/15 0548  WBC 9.8 9.9  RBC 2.49* 2.65*  HCT 24.8* 26.5*  PLT 124* 134*    Recent Labs  09/26/15 0542 09/27/15 0548  NA 137 137  K 4.3 4.0  CL 104 101  CO2 28 28  BUN 37* 37*  CREATININE 1.97* 1.77*  GLUCOSE 96 94  CALCIUM 7.6* 7.6*    Recent Labs  09/27/15 0548 09/28/15 0553  INR 2.18* 2.49*    Patient is alert and in no apparent distress Chest: Decreased breath sound bilaterally, no rales rhonchi or egophony. He doesn't have any wheezing Heart exam revealed regular rate and rhythm no murmur Abdomen: Distended, nontender and positive bowel sounds Extremities he has 2+ edema bilaterally.  Assessment/Plan: Problem #1 acute kidney injury: Most likely secondary to prerenal versus ATN. His BUN and creatinine is continuously improving. Problem #2 hypokalemia: Most likely secondary to diuretics.  Presently patient on potassium 40 mEq by mouth once a day and his potassium is normal. Problem #3 anasarca: Patient on diuretics. He has about 4600 cc of urine output. Patient on Demadex and Aldactone. His anasarca seems to be improving. Patient has lost about 12 pounds the last couple of days. Patient is still with significant sign of fluid  overload. Problem #4 anemia: His hemoglobin is low but improving. Problem #5 hypertension: His blood pressure is reasonably controlled  Problem #6 history of ileus: Patient is moving his bowels but complains of some abdominal pain and asking for pain medication. Problem #7 history of COPD Plan: 1] we'll continue his present management 2] we'll check his renal panel in the morning.   Roberto [name] 09/28/2015, 8:22 AM

## 2015-09-28 NOTE — Progress Notes (Signed)
PICC line reinserted after infiltrating yesterday can now resume IV meds as per nephrology Lasix 60 mg IV every 12 continue SCDs to mobilize third space fluid with her renal function electrolytes and body weight and I&O daily Roberto [name] [id number] DOB: 08/29/1945 DOA: 09/15/2015 PCP: Isabella Stalling, MD             Physical Exam: Blood pressure 112/63, pulse 82, temperature 98.2 F (36.8 C), temperature source Oral, resp. rate 20, height  (1.854 [name]), weight 266 lb 5.1 oz (120.8 kg), SpO2 94 %. lungs diminished breath sounds in the bases prolonged inspiratory  Phase no rales no wheezes heart regular rhythm no S3-S4 no heaves feels rubs   Investigations:  No results found for this or any previous visit (from the past 240 hour(s)).   Basic Metabolic Panel:  Recent Labs  56/21/30 0542 09/27/15 0548  NA 137 137  K 4.3 4.0  CL 104 101  CO2 28 28  GLUCOSE 96 94  BUN 37* 37*  CREATININE 1.97* 1.77*  CALCIUM 7.6* 7.6*  PHOS 2.9  --    Liver Function Tests:  Recent Labs  09/26/15 0542 09/27/15 0548  AST  --  70*  ALT  --  29  ALKPHOS  --  78  BILITOT  --  1.1  PROT  --  5.7*  ALBUMIN 2.0* 2.0*     CBC:  Recent Labs  09/26/15 0542 09/27/15 0548  WBC 9.8 9.9  HGB 8.2* 8.9*  HCT 24.8* 26.5*  MCV 99.6 100.0  PLT 124* 134*    Dg Chest 1 View  09/26/2015  CLINICAL DATA:  Status post PICC line placement. EXAM: CHEST 1 VIEW COMPARISON:  Chest radiograph 09/15/2015. FINDINGS: Right upper extremity PICC line is present with tip projecting over the superior vena cava. Stable cardiac and mediastinal contours. Low lung volumes. Bilateral lower lung heterogeneous opacities. No pleural effusion or pneumothorax. Midline surgical staple line. IMPRESSION: Right upper extremity PICC line tip projects over the superior cavoatrial junction. Basilar airspace opacities may represent atelectasis or infection. Electronically Signed   By: Annia Belt [name].D.   On:  09/26/2015 15:42   Dg Abd 2 Views  09/26/2015  CLINICAL DATA:  Abdominal distention. Evaluate ileus and cecal diameter. EXAM: ABDOMEN - 2 VIEW COMPARISON:  09/15/2015 FINDINGS: Improving gaseous distention of bowel. Gas within mildly prominent small bowel loops in the left abdomen an nondistended colon. Cecum not currently visualized or measurable. No free air organomegaly. IMPRESSION: Improving gaseous distention of bowel. No current evidence of obstruction or ileus. Electronically Signed   By: Charlett Nose [name].D.   On: 09/26/2015 14:09      Medications:   Impression:  Active Problems:   HTN (hypertension)   Renal insufficiency   Debility   Cirrhosis (HCC)   Hx of substance abuse   Adynamic ileus (HCC)   Ascites   Epidural abscess   Chronic diastolic heart failure (HCC)   Ileus (HCC)   Abdominal pain     Plan continue IV Lasix 60 mg every 12 hours continue mobilizing third space fluid with SCDs monitor daily weights andmonitor electrolytes and renal function daily as per nephrology. Continue anticoagulation and IV vancomycin  Consultants: Nephrology and gastroenterology   Procedures   Antibiotics: Vancomycin                  Code Status: Full  Family Communication:    Disposition Plan   Time spent: 40 minutes   LOS: 13  days   Roberto [name]   09/28/2015, 12:25 PM

## 2015-09-28 NOTE — Progress Notes (Signed)
  Subjective:  Patient states he has had a good day today. He denies nausea. He states he most of his breakfast and some of his lunch. He had a bowel movement early this morning. He feels he has more feeling in his left foot.    Objective: Blood pressure 112/63, pulse 82, temperature 98.2 F (36.8 C), temperature source Oral, resp. rate 20, height  (1.854 m), weight 266 lb 5.1 oz (120.8 kg), SpO2 94 %. Patient is alert and in no acute distress. Asterixis absent. Abdomen is distended with normal bowel sounds. Lower abdomen is soft. Upper abdomen is somewhat tense. Percussion noticed tympanitic. Scrotal edema is less than yesterday. LE edema 3+. Wrinkling noted to skin of both feet.   Urine output 4600 mL last 24 hours.  Labs/studies Results:   Recent Labs  09/26/15 0542 09/27/15 0548  WBC 9.8 9.9  HGB 8.2* 8.9*  HCT 24.8* 26.5*  PLT 124* 134*    BMET   Recent Labs  09/26/15 0542 09/27/15 0548  NA 137 137  K 4.3 4.0  CL 104 101  CO2 28 28  GLUCOSE 96 94  BUN 37* 37*  CREATININE 1.97* 1.77*  CALCIUM 7.6* 7.6*     INR 2.49  Assessment:  #1. Fluid overload. Patient continues to diurese. He still has way to go.  #2. Abdominal distention. Abdomen is more distended today than yesterday. Abdominal distention primarily secretory to colonic ileus due to pain medication and immobility.  #3. Anemia. Patient received two units of PRBCs last week. H&H not done today.  #4. Renal failure. Uncinate improvement in renal function. Creatinine is down to 1.77.  #4. Decompensated alcoholic liver disease. He has known alcoholic cirrhosis complicated by ascites thrombocytopenia and mild coagulopathy.  #5.  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 Patient is to continue antibiotic for another 4-1/2 weeks.  #6. Coagulopathy. He is now on warfarin for VTE prophylaxis. Will need to monitor INR closely as he is likely  to overshoot given underlying chronic liver disease.  #7. Postprandial epigastric pain. He is doing better today.  Recommendations;  CBC with a.m. Lab. Continue aggressive diureses before patient transferred to nursing home.

## 2015-09-28 NOTE — Progress Notes (Signed)
ANTICOAGULATION/ANTIBIOTIC CONSULT NOTE - follow up  Pharmacy Consult for Coumadin, vancomycin Indication: VTE prophylaxis, epidural abscess  No Known Allergies  Patient Measurements: Height:  (185.4 cm) Weight: 266 lb 5.1 oz (120.8 kg) IBW/kg (Calculated) : 79.9  Vital Signs: Temp: 98.2 F (36.8 C) (02/28 0602) Temp Source: Oral (02/28 0602) BP: 112/63 mmHg (02/28 0602) Pulse Rate: 82 (02/28 0602)  Labs:  Recent Labs  09/26/15 0542 09/27/15 0548 09/28/15 0553  HGB 8.2* 8.9*  --   HCT 24.8* 26.5*  --   PLT 124* 134*  --   LABPROT 20.6* 24.1* 26.6*  INR 1.78* 2.18* 2.49*  CREATININE 1.97* 1.77*  --    Medical History: Past Medical History  Diagnosis Date  . COPD (chronic obstructive pulmonary disease) (HCC)   . Hypertension   . Hepatic steatosis 12/14/2011  . Compression fracture of L1 lumbar vertebra (HCC) 12/14/2011  . C2 cervical fracture (HCC)   . Duodenal ulcer with hemorrhage 12/15/2011    Likely source of bleeding.per EGD; Dr. Karilyn Cota  . Esophageal varices without bleeding (HCC) 12/15/2011    per EGD  . Cirrhosis (HCC) 12/16/2011    per ultrasound  . Hx of substance abuse 12/17/2011  . 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 Hepatic encephalopathy (HCC)   . Helicobacter pylori antibody positive 12/19/2011  . Encephalopathy 05/18/2012  . UTI (urinary tract infection) 12/13/2011  . Ascites 05/18/2012    S/p paracentesis yielding 7880 mL  . Cirrhosis (HCC)   . Depression    Medications:  Prescriptions prior to admission  Medication Sig Dispense Refill Last Dose  . albuterol (PROVENTIL) (2.5 MG/3ML) 0.083% nebulizer solution Take 3 mLs (2.5 mg total) by nebulization every 6 (six) hours as needed for wheezing or shortness of breath. 75 mL 12 Past Week at Unknown time  . carisoprodol (SOMA) 350 MG tablet Take 1 tablet (350 mg total) by mouth 3 (three) times daily as needed for muscle spasms. 30 tablet 0 unknown  .  diphenhydrAMINE (BENADRYL) 25 MG tablet Take 1 tablet (25 mg total) by mouth at bedtime as needed for sleep. 5 tablet 0 unknown  . furosemide (LASIX) 20 MG tablet Take 20 mg by mouth daily.   09/14/2015 at Unknown time  . Heparin Sodium, Porcine, (HEP-LOCK FLUSH IJ) Inject 5 mLs as directed 3 (three) times daily.   09/14/2015 at Unknown time  . HYDROcodone-acetaminophen (NORCO/VICODIN) 5-325 MG tablet Take 1 tablet by mouth every 6 (six) hours as needed for severe pain. 6 tablet 0 09/15/2015 at Unknown time  . hydroxypropyl methylcellulose / hypromellose (ISOPTO TEARS / GONIOVISC) 2.5 % ophthalmic solution Place 1 drop into both eyes as needed for dry eyes. Reported on 07/22/2015   Past Week at Unknown time  . ibuprofen (ADVIL,MOTRIN) 400 MG tablet Take 400 mg by mouth every 6 (six) hours as needed.   09/14/2015 at Unknown time  . lactulose (CHRONULAC) 10 GM/15ML solution Take 15 mLs by mouth 3 (three) times daily as needed.  3 09/14/2015 at Unknown time  . Menthol-Methyl Salicylate (BENGAY ARTHRITIS FORMULA EX) Apply 1 application topically 3 (three) times daily as needed.   unknown  . omeprazole (PRILOSEC) 20 MG capsule Take 20 mg by mouth daily.   09/14/2015 at Unknown time  . sodium chloride 0.9 % injection Inject 10 mLs into the vein 3 (three) times daily.   09/14/2015 at Unknown time  . spironolactone (ALDACTONE) 25 MG tablet Take 1 tablet (25 mg total) by mouth daily.  30 tablet 3 09/14/2015 at Unknown time  . Vancomycin (VANCOCIN) 750 MG/150ML SOLN Inject 150 mLs (750 mg total) into the vein every 12 (twelve) hours. 4000 mL 5 09/14/2015 at Unknown time   Assessment: 70yo male.  Pharmacy consulted to manage Coumadin for VTE prophylaxis.  INR is therapeutic.  No bleeding noted Vanc trough is 20 mcg/ml.  SrCr continues to decline  Goal of Therapy:  Vanc trough 15-20 mcg/ml INR 2-3 Monitor platelets by anticoagulation protocol: Yes   Plan:  Coumadin 2.5mg  po today x 1 INR daily Monitor CBC, s/sx  of bleeding complications Cont vanc 1250 mg IV q48 hours  Jaz Laningham Poteet 09/28/2015,8:33 AM

## 2015-09-28 NOTE — Evaluation (Signed)
Physical Therapy Evaluation Patient Details Name: Roberto [name] MRN: [id number] DOB: 07/06/1946 Today's Date: 09/28/2015   History of Present Illness  Roberto [name] is an 70 y.o. 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 He underwent a T5-T11 laminectomy with evacuation of epidural abscess on the evening of 2/7. initial blood cultures positive for MRSA. He requires 8 weeks total of IV antibiotics starting 2/8 until 4/4. Evaluation in the ER included a Cr of 1.45, Na of 130, Hb of 8,5 g per dL, Lipase of 64, normal K, and abdominal pelvic CT showed ascites, along with evidence of ileus, and foley balloon in the prostatic urethra. NGT was placed, and IV Cipro was given. Patient couldn't tolerate the NGT so I discontinued it. He was given ativan IV  which helped some. Hospitalist was asked to admit him for ileus, post op, aggravated with narcotics, ascites, and abdominal pain.   Clinical Impression  Pt found in his bed, in no acute pain or distress. A&Ox3 and willing to participate in PT. Pt reports not being out of bed for almost 3 weeks. He has diminished sensation and proprioception throughout BLE. Strength is somewhat intact, with trace muscle activation noted as well as the ability to move his big toe on BLE. His BUE is WNL. He demonstrated log roll to L and R with use of UE and MaxA from therapist. Unable to assess transfers and sitting edge of bed at this time, secondary to therapist safety. Currently recommend continued PT services while at [facility] to improve his function, strength, and mobility.    Follow Up Recommendations SNF    Equipment Recommendations  None recommended by PT    Recommendations for Other Services       Precautions / Restrictions Restrictions Weight Bearing Restrictions: No      Mobility  Bed  Mobility Overal bed mobility: + 2 for safety/equipment Bed Mobility: Rolling Rolling: Max assist (With pt using bed rails)         General bed mobility comments: needs total assist for all other bed mobility at this time.  Transfers Overall transfer level:  (Unsafe to attempt this session due to level of assist required for bed mobility. )                  Ambulation/Gait Ambulation/Gait assistance:  (unsafe to attempt this session. )              Stairs            Wheelchair Mobility    Modified Rankin (Stroke Patients Only)       Balance Overall balance assessment: Needs assistance (Unable to assess)                                           Pertinent Vitals/Pain Pain Assessment: No/denies pain    Home Living Family/patient expects to be discharged to:: Private residence Living Arrangements: Alone Available Help at Discharge: Neighbor Type of Home: Apartment Home Access: Level entry     Home Layout: One level Home Equipment: None Additional Comments: Pt was recently admitted to [facility], and spent 1 day there for skilled services after his fall in the beginning of feb.     Prior Function Level of Independence:  Independent         Comments: Reports he was not driving, but he would walk to the convenient store down the road. No AD.      Hand Dominance        Extremity/Trunk Assessment   Upper Extremity Assessment: Overall WFL for tasks assessed             RLE Deficits / Details: 1/5 throughout BLE, except 2/5 for B big toe       Communication   Communication: No difficulties  Cognition Arousal/Alertness: Awake/alert Behavior During Therapy: WFL for tasks assessed/performed Overall Cognitive Status: No family/caregiver present to determine baseline cognitive functioning                      General Comments      Exercises        Assessment/Plan    PT Assessment Patient needs continued  PT services  PT Diagnosis Generalized weakness;Acute pain;Difficulty walking   PT Problem List Decreased strength;Decreased mobility;Decreased range of motion;Decreased activity tolerance;Decreased coordination;Pain;Impaired sensation  PT Treatment Interventions Therapeutic exercise;Therapeutic activities;Functional mobility training;Balance training;Patient/family education;Manual techniques;Wheelchair mobility training   PT Goals (Current goals can be found in the Care Plan section) Acute Rehab PT Goals Patient Stated Goal: To regain some independence and have less pain.  PT Goal Formulation: With patient Time For Goal Achievement: 10/12/15 Potential to Achieve Goals: Fair    Frequency Min 3X/week   Barriers to discharge Decreased caregiver support      Co-evaluation PT/OT/SLP Co-Evaluation/Treatment: Yes             End of Session   Activity Tolerance: Patient tolerated treatment well Patient left: in bed;with call bell/phone within reach;with nursing/sitter in room Nurse Communication: Mobility status         Time: 1435-1455 PT Time Calculation (min) (ACUTE ONLY): 20 min   Charges:   PT Evaluation $PT Eval High Complexity: 1 Procedure PT Treatments $Therapeutic Activity: 8-22 mins   PT G Codes:         3:21 PM,October 28, 2015 Marylyn Ishihara PT, DPT [facility] [phone]

## 2015-09-28 NOTE — Care Management Note (Signed)
Case Management Note  Patient Details  Name: SAMIE REASONS MRN: [id number] Date of Birth: 06-06-1946   Expected Discharge Date:      09/30/2015            Expected Discharge Plan:  Skilled Nursing Facility  In-House Referral:  Clinical Social Work  Discharge planning Services  CM Consult  Post Acute Care Choice:  NA Choice offered to:  NA  DME Arranged:    DME Agency:     HH Arranged:    HH Agency:     Status of Service:  In process, will continue to follow  Medicare Important Message Given:  Yes Date Medicare IM Given:    Medicare IM give by:    Date Additional Medicare IM Given:    Additional Medicare Important Message give by:     If discussed at Long Length of Stay Meetings, dates discussed:  09/28/2015  Additional Comments: Pt started on IVF and IV abx.    Clarification: Pt started on PO diuretics on 2/23 and 2/25, PICC infiltrated and replaced on 2/26.    Malcolm Metro, RN 09/28/2015, 2:19 PM

## 2015-09-29 ENCOUNTER — Encounter (HOSPITAL_COMMUNITY): Payer: Self-pay | Admitting: Primary Care

## 2015-09-29 DIAGNOSIS — G062 Extradural and subdural abscess, unspecified: Secondary | ICD-10-CM

## 2015-09-29 DIAGNOSIS — R140 Abdominal distension (gaseous): Secondary | ICD-10-CM

## 2015-09-29 DIAGNOSIS — F10230 Alcohol dependence with withdrawal, uncomplicated: Secondary | ICD-10-CM

## 2015-09-29 DIAGNOSIS — R188 Other ascites: Secondary | ICD-10-CM

## 2015-09-29 DIAGNOSIS — Z515 Encounter for palliative care: Secondary | ICD-10-CM

## 2015-09-29 DIAGNOSIS — I5032 Chronic diastolic (congestive) heart failure: Secondary | ICD-10-CM

## 2015-09-29 DIAGNOSIS — R1084 Generalized abdominal pain: Secondary | ICD-10-CM

## 2015-09-29 DIAGNOSIS — Z7189 Other specified counseling: Secondary | ICD-10-CM

## 2015-09-29 DIAGNOSIS — K7031 Alcoholic cirrhosis of liver with ascites: Secondary | ICD-10-CM

## 2015-09-29 LAB — RENAL FUNCTION PANEL
ANION GAP: 7 (ref 5–15)
Albumin: 1.9 g/dL — ABNORMAL LOW (ref 3.5–5.0)
BUN: 33 mg/dL — AB (ref 6–20)
CHLORIDE: 96 mmol/L — AB (ref 101–111)
CO2: 33 mmol/L — ABNORMAL HIGH (ref 22–32)
Calcium: 7.5 mg/dL — ABNORMAL LOW (ref 8.9–10.3)
Creatinine, Ser: 1.75 mg/dL — ABNORMAL HIGH (ref 0.61–1.24)
GFR, EST AFRICAN AMERICAN: 45 mL/min — AB (ref >60)
GFR, EST NON AFRICAN AMERICAN: 39 mL/min — AB (ref >60)
Glucose, Bld: 84 mg/dL (ref 65–99)
POTASSIUM: 3.7 mmol/L (ref 3.5–5.1)
Phosphorus: 3 mg/dL (ref 2.5–4.6)
Sodium: 136 mmol/L (ref 135–145)

## 2015-09-29 LAB — CBC
HEMATOCRIT: 26.3 % — AB (ref 39.0–52.0)
HEMOGLOBIN: 8.8 g/dL — AB (ref 13.0–17.0)
MCH: 33.2 pg (ref 26.0–34.0)
MCHC: 33.5 g/dL (ref 30.0–36.0)
MCV: 99.2 fL (ref 78.0–100.0)
Platelets: 114 10*3/uL — ABNORMAL LOW (ref 150–400)
RBC: 2.65 MIL/uL — AB (ref 4.22–5.81)
RDW: 21.6 % — ABNORMAL HIGH (ref 11.5–15.5)
WBC: 7.3 10*3/uL (ref 4.0–10.5)

## 2015-09-29 LAB — PROTIME-INR
INR: 2.73 — ABNORMAL HIGH (ref 0.00–1.49)
PROTHROMBIN TIME: 28.5 s — AB (ref 11.6–15.2)

## 2015-09-29 MED ORDER — NYSTATIN 100000 UNIT/GM EX POWD
Freq: Three times a day (TID) | CUTANEOUS | Status: DC
  Administered 2015-09-29 – 2015-09-30 (×4): via TOPICAL
  Filled 2015-09-29: qty 15

## 2015-09-29 MED ORDER — WARFARIN SODIUM 5 MG PO TABS
2.5000 mg | ORAL_TABLET | Freq: Once | ORAL | Status: AC
  Administered 2015-09-29: 2.5 mg via ORAL
  Filled 2015-09-29: qty 1

## 2015-09-29 NOTE — Progress Notes (Signed)
Initial Nutrition Assessment  DOCUMENTATION CODES:  Non-severe ( moderate) malnutrition in the context of acute illness/injury. Obesity unspecified    INTERVENTION:  Ensure Enlive po BID until PO intake improves, each supplement provides 350 kcal and 20 grams of protein  Honor pt food preferences as able within diet parameters  Allow ample time for pt to consume his food   NUTRITION DIAGNOSIS:   Inadequate oral intake related to variable meal intake as evidenced by 25-75% of meals consumed.  GOAL:  Pt to meet >/= 90% of their estimated nutrition needs     MONITOR: Po intake, labs and wt trends     REASON FOR ASSESSMENT: Length of stay, malnutrition      ASSESSMENT: Pt admitted on 2/15 with c/o nausea and vomiting. He had concern for post-op ileus, ascites and continues to complain of abdominal pain. Pt was unable to tolerate NGT and it was discontinued.  His diet was advanced and his intake is 25-75% of his low sodium diet.   He has severe wt gain compared to reported usual wt of 230# or less. Pt is taking diuretics. His net I/O's since admission is -18.8 liters.    2/13-RD [facility] 70 yo male with past medical history of alcohol abuse and secondary cirrhosis initially admitted on 1/27 with alcohol withdrawal and swollen right knee concerning for septic joint. Seen by orthopedic surgery and knee aspirated, right knee cultures grew out MRSA as did blood cultures and patient initially started on IV antibiotics.during this time, patient was also treated for alcohol withdrawal. 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 Upon workup, patient underwent MRI of the thoracic spine and he was found to have an epidural abscess.  Pt reports eating 50-75% of his meals since admission (> 16 days) due to early satiety, reflux, and abdominal bloating with pain. He reports eating a good amount PTA, but states he was not eating healthfully as he got most of his  food from a convenient store. He states that he has not lost weight due to edema; he usually weighs 230 lbs or less.  RD emphasized the importance of adequate healthful PO intake. Encouraged intake of protein and vegetables at meals. Pt states he likes Ensure and will drink it until PO intake improves. Pt has moderate muscle wasting and mild fat wasting per nutrition focused physical exam.  Labs: 3/1-BUN-33, Cr 1.75, H/H-8.8/26.3. His Albumin 1.9   Diet Order:  Diet 2 gram sodium Room service appropriate?: Yes; Fluid consistency:: Thin  Skin:   redness with 3+ pitting edema to bilateral feet and incision to back. He has SCD's on.  Last BM:   2/28 large soft, brown stool  Height:   Ht Readings from Last 1 Encounters:  09/15/15  (1.854 m)    Weight:   Wt Readings from Last 1 Encounters:  09/27/15 266 lb 5.1 oz (120.8 kg)    Ideal Body Weight:   83.6 kg  BMI:  Body mass index is 35.14 kg/(m^2). likely  skewed due to fluid status  Estimated Nutritional Needs:   Kcal:   2100-2300  Protein:   110-120 gr   Fluid:   2.1-2.3 liters daily  EDUCATION NEEDS:     Royann Shivers MS,RD,CSG,LDN Office: [phone] Pager: [phone]

## 2015-09-29 NOTE — Progress Notes (Signed)
Renal function continues to improve with IV Lasix and sequential compression devices to mobilize third space fluids routine albumin still diminished despite an sure joint with a normal parameters Coumadin his high range of normal INR. Will plan for discharge in a.m. to resume rehabilitation and continue vancomycin for 8 weeks while monitoring renal function and fluid status as well as hepatorenal syndrome Roberto [name] ZOX:[id number] DOB: 10/11/1945 DOA: 09/15/2015 PCP: Roberto Stalling, MD             Physical Exam: Blood pressure 113/61, pulse 87, temperature 98.3 F (36.8 C), temperature source Oral, resp. rate 20, height  (1.854 m), weight 266 lb 5.1 oz (120.8 kg), SpO2 90 %. lungs diminished breath sounds to bases no rales wheeze rhonchi appreciable heart regular rhythm no S3-S4 no heaves thrills rubs abdomen positive fluid wave protuberant improving compared to a week ago large external wheeze have less peripheral edema due to SCDs  Investigations:  No results found for this or any previous visit (from the past 240 hour(s)).   Basic Metabolic Panel:  Recent Labs  81/19/14 0548 09/29/15 0600  NA 137 136  K 4.0 3.7  CL 101 96*  CO2 28 33*  GLUCOSE 94 84  BUN 37* 33*  CREATININE 1.77* 1.75*  CALCIUM 7.6* 7.5*  PHOS  --  3.0   Liver Function Tests:  Recent Labs  09/27/15 0548 09/29/15 0600  AST 70*  --   ALT 29  --   ALKPHOS 78  --   BILITOT 1.1  --   PROT 5.7*  --   ALBUMIN 2.0* 1.9*     CBC:  Recent Labs  09/27/15 0548 09/29/15 0600  WBC 9.9 7.3  HGB 8.9* 8.8*  HCT 26.5* 26.3*  MCV 100.0 99.2  PLT 134* 114*    No results found.    Medications:   Impression:  Active Problems:   HTN (hypertension)   Renal insufficiency   Debility   Cirrhosis (HCC)   Hx of substance abuse   Adynamic ileus (HCC)   Ascites   Epidural abscess   Chronic diastolic heart failure (HCC)   Ileus (HCC)   Abdominal pain     Plan: Continue  anticoagulation continue an sure to increase proteins and plasma oncotic pressure to new SCDs to mobilize third space fluid into new IV Lasix as per renal continue physical therapy monitor renal function and INR in a.m.   Consultants: Nephrology gastroenterology palliative care pharmacy   Procedures   Antibiotics: IV vancomycin                  Code Status:  Family Communication:    Disposition Plan see plan above  Time spent: 45 minutes   LOS: 14 days   Roberto [name] M   09/29/2015, 11:27 AM

## 2015-09-29 NOTE — Progress Notes (Signed)
Roberto [name]  MRN: [id number]  DOB/AGE: [birth year] 70 y.o.  Primary Care Physician:DONDIEGO,RICHARD M, MD  Admit date: 09/15/2015  Chief Complaint:  Chief Complaint  Patient presents with  . Abdominal Pain    S-Pt presented on  09/15/2015 with  Chief Complaint  Patient presents with  . Abdominal Pain  .    Pt main concern was " I am so sick, is this the way to live"      I  tried to answer pt questions and reassure to best of my ability.   Meds . sodium chloride   Intravenous Once  . citalopram  20 mg Oral Daily  . feeding supplement (ENSURE ENLIVE)  237 mL Oral BID BM  . furosemide  60 mg Intravenous Q12H  . guaiFENesin  600 mg Oral BID  . magnesium oxide  400 mg Oral BID  . oxyCODONE  5 mg Oral TID WC & HS  . pantoprazole  40 mg Oral BID  . polyethylene glycol  17 g Oral QHS  . potassium chloride  40 mEq Oral Daily  . rifaximin  550 mg Oral BID  . spironolactone  50 mg Oral Daily  . sucralfate  1 g Oral TID WC & HS  . tamsulosin  0.4 mg Oral Daily  . vancomycin  1,500 mg Intravenous Q48H  . Warfarin - Pharmacist Dosing Inpatient   Does not apply Q24H     Physical Exam: Vital signs in last 24 hours: Temp:  [98.3 F (36.8 C)-99.1 F (37.3 C)] 98.3 F (36.8 C) (03/01 0714) Pulse Rate:  [79-87] 87 (03/01 0714) Resp:  [20] 20 (03/01 0714) BP: (102-113)/(59-61) 113/61 mmHg (03/01 0714) SpO2:  [90 %-93 %] 90 % (03/01 0714) Weight change:  Last BM Date: 09/27/15  Intake/Output from previous day: 02/28 0701 - 03/01 0700 In: 600 [P.O.:600] Out: 3850 [Urine:3850] Total I/O In: -  Out: 1950 [Urine:1950]   Physical Exam: General- pt is awake,alert,follows commands  Resp- No acute REsp distress, decreased bs at bases. CVS- S1S2 regular in rate and rhythm GIT- BS+, soft, NT, Distended+  EXT-2+ LE Edema, NO Cyanosis   Lab Results: CBC  Recent Labs  09/27/15 0548 09/29/15 0600  WBC 9.9 7.3  HGB 8.9* 8.8*  HCT 26.5* 26.3*  PLT 134* 114*     BMET  Recent Labs  09/27/15 0548 09/29/15 0600  NA 137 136  K 4.0 3.7  CL 101 96*  CO2 28 33*  GLUCOSE 94 84  BUN 37* 33*  CREATININE 1.77* 1.75*  CALCIUM 7.6* 7.5*    MICRO No results found for this or any previous visit (from the past 240 hour(s)).    Lab Results  Component Value Date   CALCIUM 7.5* 09/29/2015   CAION 1.00* 09/07/2015   PHOS 3.0 09/29/2015      Impression: 1)Renal  AKI secondary to Mutliple factors                AKI sec to   Prerenal/ATN.                AKI now better                Creat stable               2)CVs-hemodynamically stable Medication- On Diuretics  3)Anemia HGb low but stable   4)ID-hx of Epidural abscess On IV vanco  5)Liver- hx of Cirrhosis GI and Primary MD following  6)Electrolytes Normokalemic   normonatremic  7)Acid base Co2 at goal     Plan:  Will continue current care     Marcos Ruelas S 09/29/2015, 8:56 AM

## 2015-09-29 NOTE — Progress Notes (Signed)
Patient refuses Flexiseal to be reinserted. He stated if he changes his mind he will notify nurse.

## 2015-09-29 NOTE — Progress Notes (Signed)
  Subjective:  Patient is in tears and tells me that he was told that he is dying. He denies nausea or vomiting. He says his appetite is fair. Maines with intermittent abdominal pain. He states pain medications helping.  Objective: Blood pressure 102/57, pulse 118, temperature 98.5 F (36.9 C), temperature source Oral, resp. rate 20, height  (1.854 m), weight 266 lb 5.1 oz (120.8 kg), SpO2 91 %. Patient is alert and in no acute distress. Abdomen is less distended than yesterday. Bowel sounds are normal. Percussion note remains very tympanitic across upper abdomen. Scrotal edema unchanged in last 24 hours. Pedal edema has increased. Leg edema 2-3+.   Urine output 3850 mL last 24 hours.  Labs/studies Results:   Recent Labs  09/27/15 0548 09/29/15 0600  WBC 9.9 7.3  HGB 8.9* 8.8*  HCT 26.5* 26.3*  PLT 134* 114*    BMET   Recent Labs  09/27/15 0548 09/29/15 0600  NA 137 136  K 4.0 3.7  CL 101 96*  CO2 28 33*  GLUCOSE 94 84  BUN 37* 33*  CREATININE 1.77* 1.75*  CALCIUM 7.6* 7.5*     INR 2.49  Assessment:  #1. Fluid overload. Significant improvement in fluid overload over the last 6 days. However pedal edema has increased secondary to Flotron which will be be discontinued.  Renal function will have to be monitored while diuresis being pursued.  #2. Abdominal distention secondary to dilated transverse colon. He is less distended today. His bowels are moving  #3. Anemia. H&H remains low but stable.  #4. Renal failure. Uncinate improvement in renal function. Creatinine is down to 1.75.  #4. 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 Given abdominal distention he is not a candidate for lactulose therapy.   #5.  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 Patient is to continue antibiotic for another 4-1/2 weeks.  #6. Coagulopathy. He is now on warfarin for VTE  prophylaxis. Will need to monitor INR closely as he is likely to overshoot given underlying chronic liver disease.  Recommendations;  DC mechanical DVT prophylaxis since it is making his pedal edema worse and now that he is anticoagulated he does not need mechanical support. Continue Xifaxan as patient not a candidate for lactulose because of underlying abdominal colonic distention.

## 2015-09-29 NOTE — Consult Note (Signed)
Consultation Note Date: 09/29/2015   Patient Name: Roberto [name]  DOB: 01-13-1946  MRN: [id number]  Age / Sex: 70 y.o., male  PCP: Oval Linsey, MD Referring Physician: Oval Linsey, MD  Reason for Consultation: Disposition, Establishing goals of care, Pain control and Psychosocial/spiritual support    Clinical Assessment/Narrative: LATE ENTRY Roberto [name] is lying in bed as I enter. He tells me that he is having terrible pain. He does not make eye contact. He is grossly swollen in his abdomen and lower extremities. We briefly talk about healthcare power of attorney and advanced directives. He tells me that he wants his eldest son Roberto [name] to make choices for him if he is not able. We also talk about the severity of his illness and the treatment plan going forward. I share with him my worries about his paraplegia. Roberto [name] is unable to focus on much other than his pain. But he does share that he is also worried about what this time look like for him. We talk about comfort and dignity. We talk about him returning to [facility] skilled nursing [facility] for rehab to improve his upper body strength.  Call to eldest son Roberto [name] left voicemail message.   Contacts/Participants in Discussion: Roberto [name] is able to make his own decisions at this time. But due to the severity of his illness, he needs the support of his adult sons. Primary Decision Maker: as above  Relationship to Patient as above HCPOA: no   SUMMARY OF RECOMMENDATIONS  Code Status/Advance Care Planning: DNR  he shares that his desire is to allow a natural death.    Code Status Orders        Start     Ordered   09/29/15 1410  Do not attempt resuscitation (DNR)   Continuous    Question Answer Comment  In the event of cardiac or respiratory ARREST Do not call a "code blue"   In the event of cardiac or respiratory ARREST Do not perform Intubation, CPR,  defibrillation or ACLS   In the event of cardiac or respiratory ARREST Use medication by any route, position, wound care, and other measures to relive pain and suffering. May use oxygen, suction and manual treatment of airway obstruction as needed for comfort.      09/29/15 1409    Code Status History    Date Active Date Inactive Code Status Order ID Comments User Context   09/15/2015 10:45 AM 09/29/2015  2:09 PM Full Code [id number]  Houston Siren, MD Inpatient   09/09/2015 10:21 AM 09/13/2015 10:38 PM Full Code [id number]  Hollice Espy, MD Inpatient   08/27/2015  5:05 PM 09/08/2015  2:54 AM Full Code [id number]  Henderson Cloud, MD Inpatient   06/02/2015  3:46 PM 06/10/2015  6:19 PM Full Code [id number]  Rachael Fee, MD Inpatient   09/19/2014  9:30 PM 09/21/2014  8:54 PM Full Code [id number]  Levie Heritage, DO Inpatient   05/18/2012  5:38 PM 05/22/2012  5:37 PM Full Code [id number]  Erick Blinks, MD Inpatient   12/13/2011  2:58 PM 12/21/2011  6:08 PM Full Code [id number]  Lovena Neighbours, RN ED   08/17/2011  5:46 PM 08/18/2011 12:56 PM Full Code [id number]  Quitman Livings, RN Inpatient    Advance Directive Documentation        Most Recent Value   Type of Advance Directive  Living will   Pre-existing out of facility DNR order (yellow form or pink MOST  form)     "MOST" Form in Place?        Other Directives:None  Symptom Management:   per Dr. Don DieDelbert Harnessalliative Prophylaxis:   Bowel Regimen and Turn Reposition  Additional Recommendations (Limitations, Scope, Preferences):  Full Scope Treatment and Treat the treatable at this point.   Roberto [name] tells me that he would rather die than have an NG tube placed ever again.  Psycho-social/Spiritual:  Support System: Adequate Desire for further Chaplaincy support: ongoing Additional Recommendations: Education on Hospice  Prognosis: < 12 months, based on new onset paraplegia, and epidural abscess.  Discharge Planning: Skilled Nursing  Facility for rehab with Palliative care service follow-up   Chief Complaint/ Primary Diagnoses: Present on Admission:  . Ileus (HCC) . Adynamic ileus (HCC) . Ascites . Chronic diastolic heart failure (HCC) . Cirrhosis (HCC) . Debility . Epidural abscess . HTN (hypertension) . Abdominal pain  I have reviewed the medical record, interviewed the patient and family, and examined the patient. The following aspects are pertinent.  Past Medical History  Diagnosis Date  . COPD (chronic obstructive pulmonary disease) (HCC)   . Hypertension   . Hepatic steatosis 12/14/2011  . Compression fracture of L1 lumbar vertebra (HCC) 12/14/2011  . C2 cervical fracture (HCC)   . Duodenal ulcer with hemorrhage 12/15/2011    Likely source of bleeding.per EGD; Dr. Karilyn Cota  . Esophageal varices without bleeding (HCC) 12/15/2011    per EGD  . Cirrhosis (HCC) 12/16/2011    per ultrasound  . Hx of substance abuse 12/17/2011  . 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 Hepatic encephalopathy (HCC)   . Helicobacter pylori antibody positive 12/19/2011  . Encephalopathy 05/18/2012  . UTI (urinary tract infection) 12/13/2011  . Ascites 05/18/2012    S/p paracentesis yielding 7880 mL  . Cirrhosis (HCC)   . Depression    Social History   Social History  . Marital Status: Divorced    Spouse Name: N/A  . Number of Children: N/A  . Years of Education: N/A   Social History Main Topics  . Smoking status: Current Every Day Smoker -- 1.00 packs/day    Types: Cigarettes  . Smokeless tobacco: Current User     Comment: 1 1/2 pack a day  . Alcohol Use: Yes     Comment: drinking everyday.  . Drug Use: Yes    Special: Cocaine     Comment: prescription drugs/   says no cocaine in 1 month 07/13/15  . Sexual Activity: Not Currently   Other Topics Concern  . None   Social History Narrative   History reviewed. No pertinent family history. Scheduled Meds: . sodium chloride    Intravenous Once  . citalopram  20 mg Oral Daily  . feeding supplement (ENSURE ENLIVE)  237 mL Oral BID BM  . furosemide  60 mg Intravenous Q12H  . guaiFENesin  600 mg Oral BID  . magnesium oxide  400 mg Oral BID  . nystatin   Topical TID  . oxyCODONE  5 mg Oral TID WC & HS  . pantoprazole  40 mg Oral BID  . polyethylene glycol  17 g Oral QHS  . potassium chloride  40 mEq Oral Daily  . rifaximin  550 mg Oral BID  . spironolactone  50 mg Oral Daily  . sucralfate  1 g Oral TID WC & HS  . tamsulosin  0.4 mg Oral Daily  . vancomycin  1,500 mg Intravenous Q48H  . warfarin  2.5 mg Oral Once  . Warfarin - Pharmacist Dosing Inpatient   Does not apply Q24H   Continuous Infusions: . sodium chloride 50 mL/hr at 09/28/15 2024   PRN Meds:.albuterol, bisacodyl, LORazepam, ondansetron **OR** ondansetron (ZOFRAN) IV, oxyCODONE, polyvinyl alcohol Medications Prior to Admission:  Prior to Admission medications   Medication Sig Start Date End Date Taking? Authorizing Provider  albuterol (PROVENTIL) (2.5 MG/3ML) 0.083% nebulizer solution Take 3 mLs (2.5 mg total) by nebulization every 6 (six) hours as needed for wheezing or shortness of breath. 06/10/15  Yes Oneta Rack, NP  carisoprodol (SOMA) 350 MG tablet Take 1 tablet (350 mg total) by mouth 3 (three) times daily as needed for muscle spasms. 09/13/15  Yes Hollice Espy, MD  diphenhydrAMINE (BENADRYL) 25 MG tablet Take 1 tablet (25 mg total) by mouth at bedtime as needed for sleep. 06/19/15  Yes Marily Memos, MD  furosemide (LASIX) 20 MG tablet Take 20 mg by mouth daily. 08/17/15  Yes Historical Provider, MD  Heparin Sodium, Porcine, (HEP-LOCK FLUSH IJ) Inject 5 mLs as directed 3 (three) times daily.   Yes Historical Provider, MD  HYDROcodone-acetaminophen (NORCO/VICODIN) 5-325 MG tablet Take 1 tablet by mouth every 6 (six) hours as needed for severe pain. 09/13/15  Yes Hollice Espy, MD  hydroxypropyl methylcellulose / hypromellose (ISOPTO  TEARS / GONIOVISC) 2.5 % ophthalmic solution Place 1 drop into both eyes as needed for dry eyes. Reported on 07/22/2015   Yes Historical Provider, MD  ibuprofen (ADVIL,MOTRIN) 400 MG tablet Take 400 mg by mouth every 6 (six) hours as needed.   Yes Historical Provider, MD  lactulose (CHRONULAC) 10 GM/15ML solution Take 15 mLs by mouth 3 (three) times daily as needed. 07/22/15  Yes Historical Provider, MD  Menthol-Methyl Salicylate (BENGAY ARTHRITIS FORMULA EX) Apply 1 application topically 3 (three) times daily as needed.   Yes Historical Provider, MD  omeprazole (PRILOSEC) 20 MG capsule Take 20 mg by mouth daily.   Yes Historical Provider, MD  sodium chloride 0.9 % injection Inject 10 mLs into the vein 3 (three) times daily.   Yes Historical Provider, MD  spironolactone (ALDACTONE) 25 MG tablet Take 1 tablet (25 mg total) by mouth daily. 07/22/15  Yes Len Blalock, NP  Vancomycin (VANCOCIN) 750 MG/150ML SOLN Inject 150 mLs (750 mg total) into the vein every 12 (twelve) hours. 09/13/15 11/02/15 Yes Hollice Espy, MD   No Known Allergies  Review of Systems  Unable to perform ROS: Acuity of condition    Physical Exam  Nursing note and vitals reviewed.   Vital Signs: BP 102/57 mmHg  Pulse 118  Temp(Src) 98.5 F (36.9 C) (Oral)  Resp 20  Ht 6\' 1"  (1.854 m)  Wt 120.8 kg (266 lb 5.1 oz)  BMI 35.14 kg/m2  SpO2 91%  SpO2: SpO2: 91 % O2 Device:SpO2: 91 % O2 Flow Rate: .O2 Flow Rate (L/min): 1 L/min  IO: Intake/output summary:  Intake/Output Summary (Last 24 hours) at 09/29/15 1542 Last data filed at 09/29/15 1438  Gross per 24 hour  Intake    600 ml  Output   5200 ml  Net  -4600 ml    LBM: Last BM Date: 09/28/15 Baseline Weight: Weight: 108.863 kg (240 lb) Most recent weight: Weight: 120.8 kg (266 lb 5.1 oz)      Palliative Assessment/Data:    Additional Data Reviewed:  CBC:    Component Value Date/Time   WBC 7.3 09/29/2015 0600   HGB 8.8* 09/29/2015 0600  HCT 26.3*  09/29/2015 0600   PLT 114* 09/29/2015 0600   MCV 99.2 09/29/2015 0600   NEUTROABS 6.6 09/15/2015 0433   LYMPHSABS 1.8 09/15/2015 0433   MONOABS 1.4* 09/15/2015 0433   EOSABS 0.3 09/15/2015 0433   BASOSABS 0.1 09/15/2015 0433   Comprehensive Metabolic Panel:    Component Value Date/Time   NA 136 09/29/2015 0600   K 3.7 09/29/2015 0600   CL 96* 09/29/2015 0600   CO2 33* 09/29/2015 0600   BUN 33* 09/29/2015 0600   CREATININE 1.75* 09/29/2015 0600   CREATININE 1.02 07/22/2015 1150   GLUCOSE 84 09/29/2015 0600   CALCIUM 7.5* 09/29/2015 0600   AST 70* 09/27/2015 0548   ALT 29 09/27/2015 0548   ALKPHOS 78 09/27/2015 0548   BILITOT 1.1 09/27/2015 0548   PROT 5.7* 09/27/2015 0548   ALBUMIN 1.9* 09/29/2015 0600     Time In: 1310 Time Out: 1430 Time Total: 80 minutes Greater than 50%  of this time was spent counseling and coordinating care related to the above assessment and plan.  Signed by: Katheran Awe, NP  Katheran Awe, NP  09/29/2015, 3:42 PM  Please contact Palliative Medicine Team phone at [phone] for questions and concerns.

## 2015-09-29 NOTE — Clinical Social Work Note (Signed)
Pt will likely discharge tomorrow and return to [facility]. CSW updated facility. Pt is able to return at discharge. CSW will continue to follow.   Dede Query, MSW, LCSW  Clinical Social Worker  Coverage [phone]

## 2015-09-29 NOTE — Progress Notes (Signed)
ANTICOAGULATION/ANTIBIOTIC CONSULT NOTE - follow up  Pharmacy Consult for Coumadin, vancomycin Indication: VTE prophylaxis, epidural abscess  No Known Allergies  Patient Measurements: Height:  (185.4 cm) Weight: 266 lb 5.1 oz (120.8 kg) IBW/kg (Calculated) : 79.9  Vital Signs: Temp: 98.3 F (36.8 C) (03/01 0714) Temp Source: Oral (03/01 0714) BP: 113/61 mmHg (03/01 0714) Pulse Rate: 87 (03/01 0714)  Labs:  Recent Labs  09/27/15 0548 09/28/15 0553 09/29/15 0600  HGB 8.9*  --  8.8*  HCT 26.5*  --  26.3*  PLT 134*  --  114*  LABPROT 24.1* 26.6* 28.5*  INR 2.18* 2.49* 2.73*  CREATININE 1.77*  --  1.75*   Medical History: Past Medical History  Diagnosis Date  . COPD (chronic obstructive pulmonary disease) (HCC)   . Hypertension   . Hepatic steatosis 12/14/2011  . Compression fracture of L1 lumbar vertebra (HCC) 12/14/2011  . C2 cervical fracture (HCC)   . Duodenal ulcer with hemorrhage 12/15/2011    Likely source of bleeding.per EGD; Dr. Karilyn Cota  . Esophageal varices without bleeding (HCC) 12/15/2011    per EGD  . Cirrhosis (HCC) 12/16/2011    per ultrasound  . Hx of substance abuse 12/17/2011  . 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 Hepatic encephalopathy (HCC)   . Helicobacter pylori antibody positive 12/19/2011  . Encephalopathy 05/18/2012  . UTI (urinary tract infection) 12/13/2011  . Ascites 05/18/2012    S/p paracentesis yielding 7880 mL  . Cirrhosis (HCC)   . Depression    Medications:  Prescriptions prior to admission  Medication Sig Dispense Refill Last Dose  . albuterol (PROVENTIL) (2.5 MG/3ML) 0.083% nebulizer solution Take 3 mLs (2.5 mg total) by nebulization every 6 (six) hours as needed for wheezing or shortness of breath. 75 mL 12 Past Week at Unknown time  . carisoprodol (SOMA) 350 MG tablet Take 1 tablet (350 mg total) by mouth 3 (three) times daily as needed for muscle spasms. 30 tablet 0 unknown  .  diphenhydrAMINE (BENADRYL) 25 MG tablet Take 1 tablet (25 mg total) by mouth at bedtime as needed for sleep. 5 tablet 0 unknown  . furosemide (LASIX) 20 MG tablet Take 20 mg by mouth daily.   09/14/2015 at Unknown time  . Heparin Sodium, Porcine, (HEP-LOCK FLUSH IJ) Inject 5 mLs as directed 3 (three) times daily.   09/14/2015 at Unknown time  . HYDROcodone-acetaminophen (NORCO/VICODIN) 5-325 MG tablet Take 1 tablet by mouth every 6 (six) hours as needed for severe pain. 6 tablet 0 09/15/2015 at Unknown time  . hydroxypropyl methylcellulose / hypromellose (ISOPTO TEARS / GONIOVISC) 2.5 % ophthalmic solution Place 1 drop into both eyes as needed for dry eyes. Reported on 07/22/2015   Past Week at Unknown time  . ibuprofen (ADVIL,MOTRIN) 400 MG tablet Take 400 mg by mouth every 6 (six) hours as needed.   09/14/2015 at Unknown time  . lactulose (CHRONULAC) 10 GM/15ML solution Take 15 mLs by mouth 3 (three) times daily as needed.  3 09/14/2015 at Unknown time  . Menthol-Methyl Salicylate (BENGAY ARTHRITIS FORMULA EX) Apply 1 application topically 3 (three) times daily as needed.   unknown  . omeprazole (PRILOSEC) 20 MG capsule Take 20 mg by mouth daily.   09/14/2015 at Unknown time  . sodium chloride 0.9 % injection Inject 10 mLs into the vein 3 (three) times daily.   09/14/2015 at Unknown time  . spironolactone (ALDACTONE) 25 MG tablet Take 1 tablet (25 mg total) by mouth daily.  30 tablet 3 09/14/2015 at Unknown time  . Vancomycin (VANCOCIN) 750 MG/150ML SOLN Inject 150 mLs (750 mg total) into the vein every 12 (twelve) hours. 4000 mL 5 09/14/2015 at Unknown time   Assessment: 70yo male.  Pharmacy consulted to manage Coumadin for VTE prophylaxis.  INR is therapeutic.  No bleeding noted.  Goal of Therapy:  INR 2-3 Monitor platelets by anticoagulation protocol: Yes   Plan:  Coumadin 2.5mg  po today x 1 INR daily Monitor CBC, s/sx of bleeding complications  Margo Aye, Seira Cody A 09/29/2015,9:37 AM

## 2015-09-30 DIAGNOSIS — A4902 Methicillin resistant Staphylococcus aureus infection, unspecified site: Secondary | ICD-10-CM

## 2015-09-30 DIAGNOSIS — E43 Unspecified severe protein-calorie malnutrition: Secondary | ICD-10-CM

## 2015-09-30 DIAGNOSIS — Z515 Encounter for palliative care: Secondary | ICD-10-CM | POA: Insufficient documentation

## 2015-09-30 DIAGNOSIS — Z7189 Other specified counseling: Secondary | ICD-10-CM | POA: Insufficient documentation

## 2015-09-30 LAB — PROTIME-INR
INR: 2.79 — ABNORMAL HIGH (ref 0.00–1.49)
Prothrombin Time: 29 seconds — ABNORMAL HIGH (ref 11.6–15.2)

## 2015-09-30 MED ORDER — POTASSIUM CHLORIDE CRYS ER 20 MEQ PO TBCR: 40.0000 meq | EXTENDED_RELEASE_TABLET | Freq: Every day | ORAL | Status: AC

## 2015-09-30 MED ORDER — MAGNESIUM OXIDE 400 (241.3 MG) MG PO TABS: 400.0000 mg | ORAL_TABLET | Freq: Two times a day (BID) | ORAL | Status: AC

## 2015-09-30 MED ORDER — WARFARIN SODIUM 2 MG PO TABS
2.0000 mg | ORAL_TABLET | Freq: Once | ORAL | Status: AC
  Administered 2015-09-30: 2 mg via ORAL
  Filled 2015-09-30: qty 1

## 2015-09-30 MED ORDER — FUROSEMIDE 20 MG PO TABS: 40.0000 mg | ORAL_TABLET | Freq: Every day | ORAL | Status: DC

## 2015-09-30 MED ORDER — VANCOMYCIN HCL 10 G IV SOLR: 1500.0000 mg | INTRAVENOUS | Status: DC

## 2015-09-30 MED ORDER — PANTOPRAZOLE SODIUM 40 MG PO TBEC: 40.0000 mg | DELAYED_RELEASE_TABLET | Freq: Two times a day (BID) | ORAL | Status: AC

## 2015-09-30 MED ORDER — CITALOPRAM HYDROBROMIDE 20 MG PO TABS: 20.0000 mg | ORAL_TABLET | Freq: Every day | ORAL | Status: AC

## 2015-09-30 MED ORDER — SUCRALFATE 1 GM/10ML PO SUSP: 1.0000 g | Freq: Three times a day (TID) | ORAL | Status: AC

## 2015-09-30 MED ORDER — ENSURE ENLIVE PO LIQD: 237.0000 mL | Freq: Two times a day (BID) | ORAL | Status: AC

## 2015-09-30 MED ORDER — SPIRONOLACTONE 25 MG PO TABS: 25.0000 mg | ORAL_TABLET | Freq: Two times a day (BID) | ORAL | Status: AC

## 2015-09-30 MED ORDER — WARFARIN SODIUM 2 MG PO TABS: 2.0000 mg | ORAL_TABLET | Freq: Once | ORAL | Status: AC

## 2015-09-30 NOTE — Progress Notes (Signed)
ANTICOAGULATION CONSULT NOTE - follow up  Pharmacy Consult for Coumadin Indication: VTE prophylaxis  No Known Allergies  Patient Measurements: Height:  (185.4 cm) Weight: 266 lb 5.1 oz (120.8 kg) IBW/kg (Calculated) : 79.9  Vital Signs: Temp: 98.5 F (36.9 C) (03/02 0530) Temp Source: Oral (03/02 0530) BP: 100/41 mmHg (03/02 0530) Pulse Rate: 78 (03/02 0530)  Labs:  Recent Labs  09/28/15 0553 09/29/15 0600 09/30/15 0708  HGB  --  8.8*  --   HCT  --  26.3*  --   PLT  --  114*  --   LABPROT 26.6* 28.5* 29.0*  INR 2.49* 2.73* 2.79*  CREATININE  --  1.75*  --    Medical History: Past Medical History  Diagnosis Date  . COPD (chronic obstructive pulmonary disease) (HCC)   . Hypertension   . Hepatic steatosis 12/14/2011  . Compression fracture of L1 lumbar vertebra (HCC) 12/14/2011  . C2 cervical fracture (HCC)   . Duodenal ulcer with hemorrhage 12/15/2011    Likely source of bleeding.per EGD; Dr. Karilyn Cota  . Esophageal varices without bleeding (HCC) 12/15/2011    per EGD  . Cirrhosis (HCC) 12/16/2011    per ultrasound  . Hx of substance abuse 12/17/2011  . 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 Hepatic encephalopathy (HCC)   . Helicobacter pylori antibody positive 12/19/2011  . Encephalopathy 05/18/2012  . UTI (urinary tract infection) 12/13/2011  . Ascites 05/18/2012    S/p paracentesis yielding 7880 mL  . Cirrhosis (HCC)   . Depression    Medications:  Prescriptions prior to admission  Medication Sig Dispense Refill Last Dose  . albuterol (PROVENTIL) (2.5 MG/3ML) 0.083% nebulizer solution Take 3 mLs (2.5 mg total) by nebulization every 6 (six) hours as needed for wheezing or shortness of breath. 75 mL 12 Past Week at Unknown time  . carisoprodol (SOMA) 350 MG tablet Take 1 tablet (350 mg total) by mouth 3 (three) times daily as needed for muscle spasms. 30 tablet 0 unknown  . diphenhydrAMINE (BENADRYL) 25 MG tablet Take 1  tablet (25 mg total) by mouth at bedtime as needed for sleep. 5 tablet 0 unknown  . furosemide (LASIX) 20 MG tablet Take 20 mg by mouth daily.   09/14/2015 at Unknown time  . Heparin Sodium, Porcine, (HEP-LOCK FLUSH IJ) Inject 5 mLs as directed 3 (three) times daily.   09/14/2015 at Unknown time  . HYDROcodone-acetaminophen (NORCO/VICODIN) 5-325 MG tablet Take 1 tablet by mouth every 6 (six) hours as needed for severe pain. 6 tablet 0 09/15/2015 at Unknown time  . hydroxypropyl methylcellulose / hypromellose (ISOPTO TEARS / GONIOVISC) 2.5 % ophthalmic solution Place 1 drop into both eyes as needed for dry eyes. Reported on 07/22/2015   Past Week at Unknown time  . ibuprofen (ADVIL,MOTRIN) 400 MG tablet Take 400 mg by mouth every 6 (six) hours as needed.   09/14/2015 at Unknown time  . lactulose (CHRONULAC) 10 GM/15ML solution Take 15 mLs by mouth 3 (three) times daily as needed.  3 09/14/2015 at Unknown time  . Menthol-Methyl Salicylate (BENGAY ARTHRITIS FORMULA EX) Apply 1 application topically 3 (three) times daily as needed.   unknown  . omeprazole (PRILOSEC) 20 MG capsule Take 20 mg by mouth daily.   09/14/2015 at Unknown time  . sodium chloride 0.9 % injection Inject 10 mLs into the vein 3 (three) times daily.   09/14/2015 at Unknown time  . spironolactone (ALDACTONE) 25 MG tablet Take 1 tablet (25  mg total) by mouth daily. 30 tablet 3 09/14/2015 at Unknown time  . Vancomycin (VANCOCIN) 750 MG/150ML SOLN Inject 150 mLs (750 mg total) into the vein every 12 (twelve) hours. 4000 mL 5 09/14/2015 at Unknown time   Assessment: 70yo male.  Pharmacy consulted to manage Coumadin for VTE prophylaxis.  INR is therapeutic.  Goal of Therapy:  INR 2-3 Monitor platelets by anticoagulation protocol: Yes   Plan:  Coumadin 2 mg po today x 1 INR daily Monitor CBC, s/sx of bleeding complications  Brithney Bensen Poteet 09/30/2015,8:27 AM

## 2015-09-30 NOTE — Discharge Summary (Signed)
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  Discharge Diagnoses:  Active Problems:   HTN (hypertension)   Renal insufficiency   Debility   Cirrhosis (HCC)   Hx of substance abuse   Adynamic ileus ([place])   Ascites   Epidural abscess   Chronic diastolic heart failure ([place])   Ileus (HCC)   Abdominal pain   Palliative care encounter   DNR (do not resuscitate) discussion   Discharge Condition: Guarded yet stable and improving  Filed Weights   09/20/15 0445 09/21/15 0343 09/27/15 1025  Weight: 269 lb 6.4 oz (122.2 kg) 278 lb 10.6 oz (126.4 kg) 266 lb 5.1 oz (120.8 kg)    History of present illness:  Patient is 70 year old white male known ethanol with cirrhosis and hypoproteinemia who was admitted to the hospital with ethanol withdrawal symptoms and a right knee which was swollen with accompanying rubor consideration was given to septic arthritis and he was treated for ethanol withdrawal for 4-5 day period and likewise had elevated ammonia level these were normalized and the patient became lucent. The patient had a leukocytosis and blood cultures were drawn which grew staph MRSA septicemia infectious disease consult was obtained likewise his knee had an orthopedic consult which was tapped results of which did not appear  to be septic to the orthopedic surgeon patient was placed on vancomycin as well as Zosyn then began to have renal insufficiency due to a combination of hepatorenal syndrome diminished plasma oncotic pressure due to hypoproteinemia as well as possible vancomycin patient began not moving his right and left leg MRI of lumbosacral spine as well as brain were performed which were essentially unrevealing allergy consult was obtained as simultaneously who then proceeded to do a thoracic spine MRI which revealed an epidural abscess the patient was transferred to [facility] to neurosurgery and had immediate surgery to decompress the spinal cord all the while vancomycin was continued preoperatively the patient was transferred to skilled nursing facility in return to [facility] results of an adynamic ileus ileus on chest x-ray and some nondescript abdominal pain was seen in consultation by gastroenterology and nephrology CT scan revealed no evidence of ileus nephrology believe the patient had diminished proteins diminished albumin and diminished plasma oncotic pressure hepatorenal syndrome with prerenal component as well as possible vancomycin toxicity he was given aggressive diuresis with Lasix as well as spironolactone to mobilize third space fluids and his renal function subsequently improved pharmacy had adjusted his dosage of vancomycin to 1500 mg every 48 hours pharmacy likewise had placed his Coumadin anticoagulation to a dosage of 2 mg per day he will be discharged on these dosages he is to continue vancomycin for a total period to be  concluded the end of 8 weeks.   Hospital Course:  See history of present illness  Procedures:  Consultations:  Nephrology, gastroenterology, and infectious disease, neurosurgery, physical therapy, pharmacy.  Discharge Instructions  Discharge Instructions    Discharge instructions    Complete by:  As directed      Discharge patient    Complete by:  As  directed             Medication List    STOP taking these medications        BENGAY ARTHRITIS FORMULA EX     diphenhydrAMINE 25 MG tablet  Commonly known as:  BENADRYL     HEP-LOCK FLUSH IJ     ibuprofen 400 MG tablet  Commonly known as:  ADVIL,MOTRIN     sodium chloride 0.9 % injection     Vancomycin 750 MG/150ML Soln  Commonly known as:  VANCOCIN      TAKE these medications        albuterol (2.5 MG/3ML) 0.083% nebulizer solution  Commonly known as:  PROVENTIL  Take 3 mLs (2.5 mg total) by nebulization every 6 (six) hours as needed for wheezing or shortness of breath.     carisoprodol 350 MG tablet  Commonly known as:  SOMA  Take 1 tablet (350 mg total) by mouth 3 (three) times daily as needed for muscle spasms.     citalopram 20 MG tablet  Commonly known as:  CELEXA  Take 1 tablet (20 mg total) by mouth daily.     feeding supplement (ENSURE ENLIVE) Liqd  Take 237 mLs by mouth 2 (two) times daily between meals.     furosemide 20 MG tablet  Commonly known as:  LASIX  Take 2 tablets (40 mg total) by mouth daily.     HYDROcodone-acetaminophen 5-325 MG tablet  Commonly known as:  NORCO/VICODIN  Take 1 tablet by mouth every 6 (six) hours as needed for severe pain.     lactulose 10 GM/15ML solution  Commonly known as:  CHRONULAC  Take 15 mLs by mouth 3 (three) times daily as needed.     magnesium oxide 400 (241.3 Mg) MG tablet  Commonly known as:  MAG-OX  Take 1 tablet (400 mg total) by mouth 2 (two) times daily.     omeprazole 20 MG capsule  Commonly known as:  PRILOSEC  Take 20 mg by mouth daily.     pantoprazole 40 MG tablet  Commonly known as:  PROTONIX  Take 1 tablet (40 mg total) by mouth 2 (two) times daily.     potassium chloride SA 20 MEQ tablet  Commonly known as:  K-DUR,KLOR-CON  Take 2 tablets (40 mEq total) by mouth daily.     spironolactone 25 MG tablet  Commonly known as:  ALDACTONE  Take 1 tablet (25 mg total) by mouth 2 (two) times  daily.     sucralfate 1 GM/10ML suspension  Commonly known as:  CARAFATE  Take 10 mLs (1 g total) by mouth 4 (four) times daily -  with meals and at bedtime.     vancomycin 1,500 mg in sodium chloride 0.9 % 500 mL  Inject 1,500 mg into the vein every other day.     warfarin 2 MG tablet  Commonly known as:  COUMADIN  Take 1 tablet (2 mg total) by mouth once.      ASK your doctor about these medications        hydroxypropyl methylcellulose / hypromellose 2.5 % ophthalmic solution  Commonly known as:  ISOPTO TEARS / GONIOVISC  Place 1 drop into both eyes as needed for dry  eyes. Reported on 07/22/2015       No Known Allergies    The results of significant diagnostics from this hospitalization (including imaging, microbiology, ancillary and laboratory) are listed below for reference.    Significant Diagnostic Studies: Ct Abdomen Pelvis Wo Contrast  09/15/2015  CLINICAL DATA:  Abdominal pain and distention following epidural abscess surgery on 09/07/2015. Additional history of cirrhosis and duodenum ulcer. EXAM: CT ABDOMEN AND PELVIS WITHOUT CONTRAST TECHNIQUE: Multidetector CT imaging of the abdomen and pelvis was performed following the standard protocol without IV contrast. COMPARISON:  CT abdomen pelvis 09/01/2015 FINDINGS: Lower chest: Atelectasis at the LEFT lung base. Hepatobiliary: The liver is contracted. There is a moderate volume of fluid within the abdomen consistent with ascites. The caudate lobe is enlarged. High-density dependent material within the gallbladder lumen consists of milk-of-calcium. No evidence acute cholecystitis. Pancreas: Pancreas is normal. No ductal dilatation. No pancreatic inflammation. Spleen: Normal spleen Adrenals/urinary tract: Adrenal glands and kidneys are normal. Ureters are normal. The retention balloon of Foley catheter is expanded in the prostatic urethra. The tip of the catheter just enters bladder. The bladder is mildly distended. Stomach/Bowel:  Stomach, small bowel, appendix, and cecum are normal. The colon and rectosigmoid colon are normal. Vascular/Lymphatic: Abdominal aorta is normal caliber with atherosclerotic calcification. There is no retroperitoneal or periportal lymphadenopathy. No pelvic lymphadenopathy. Reproductive: Foley catheter retention balloon is expanded in the prostatic urethra. Other: Moderate free fluid the abdomen pelvis. Fluid extends into a RIGHT inguinal hernia. Musculoskeletal: No aggressive osseous lesion. Postsurgical change in the lower thoracic spine with laminectomy. No organized fluid collection identified. IMPRESSION: 1. Moderate volume ascites in the abdomen and pelvis. Morphologic changes of liver consistent cirrhosis. 2. No evidence of bowel obstruction. 3. Retention balloon of the Foley catheter is expanded in the prostatic urethra. The bladder is mildly distended. Recommend repositioning of Foley catheter. 4. A postsurgical change in the lower thoracic spine out evidence complication. 5.  Atherosclerotic calcification of the abdominal aorta. Findings conveyed toNurse JJ on 09/15/2015  at08:06. Electronically Signed   By: Suzy Bouchard M.D.   On: 09/15/2015 08:11   Ct Abdomen Pelvis Wo Contrast  09/01/2015  CLINICAL DATA:  Inpatient. Acute constipation. Abdominal distention and bloating. Cirrhosis. EXAM: CT ABDOMEN AND PELVIS WITHOUT CONTRAST TECHNIQUE: Multidetector CT imaging of the abdomen and pelvis was performed following the standard protocol without IV contrast. COMPARISON:  Abdominal radiograph from earlier today. 05/18/2012 CT abdomen/ pelvis. FINDINGS: Images are motion degraded. Lower chest: Small layering left pleural effusion . Mild dependent atelectasis in the lingula and both lower lobes. Coronary atherosclerosis. Hepatobiliary: Diffuse hepatic steatosis. Diffusely irregular liver surface, in keeping with cirrhosis, unchanged. No gross liver mass. There is layering dense material in the nondistended  gallbladder, likely representing vicarious excretion of recently administered IV contrast. No gallbladder wall thickening. No biliary ductal dilatation. Pancreas: Normal, with no mass or duct dilation. Spleen: Normal size. No mass. Adrenals/Urinary Tract: Normal adrenals. Mild fullness of the central renal collecting systems bilaterally, without overt hydronephrosis, probably due to the prominently distended urinary bladder. No bladder wall thickening. No urolithiasis. No contour deforming renal mass. Stomach/Bowel: Grossly normal stomach. Normal caliber small bowel with no small bowel wall thickening. Normal appendix. Oral contrast progresses to the ascending colon. There is prominent gaseous distention of the cecum, ascending colon, hepatic flexure of the colon and transverse colon. There is a gradual transition to a normal caliber descending colon. Mild gaseous distention of the proximal sigmoid colon. Collapsed distal sigmoid colon  and rectum. No large bowel wall thickening or pericolonic fat stranding. Tiny gas bubbles layering in the cecum and appendix are favored to represent liquid stool, with no definite pneumatosis. No colonic diverticulosis. Rectal drain is in place with the tip in the lower rectum. Vascular/Lymphatic: Atherosclerotic nonaneurysmal abdominal aorta. No pathologically enlarged lymph nodes in the abdomen or pelvis. Reproductive: Mild prostatomegaly. Nonspecific internal prostatic calcifications. Other: No pneumoperitoneum. Small volume ascites predominantly perihepatic. Small left inguinal hernia containing fat and ascitic fluid . Musculoskeletal: No aggressive appearing focal osseous lesions. There is apparent fragmentation of the visualized lower T8 vertebral body with associated paraspinal soft tissue swelling. Stable chronic moderate T12 vertebral compression fracture. Stable chronic mild L1 vertebral compression fracture. Mild compression fracture of the L3 vertebral body with large  Schmorl's nodes, which is new since 05/18/2012. Moderate degenerative changes in the visualized thoracolumbar spine. IMPRESSION: 1. Moderate adynamic ileus of the colon. No evidence of bowel obstruction. 2. Apparent fragmentation of the visualized lower T8 vertebral body with associated paraspinal soft tissue swelling, suggesting an acute incompletely evaluated process such as a compression fracture. Dedicated thoracic spine radiographs or thoracic spine CT or MRI are advised. 3. Mild L3 vertebral compression fracture with large Schmorl's nodes, of uncertain chronicity, new since 05/18/2012. Stable chronic moderate T12 and mild L1 vertebral compression fractures. 4. Cirrhosis. Diffuse hepatic steatosis. No gross liver mass. Small volume ascites. 5. Distended bladder with fullness of the renal collecting systems without overt hydronephrosis. Mild prostatomegaly. Please exclude bladder outlet obstruction clinically. 6. Small layering left pleural effusion. 7. Coronary atherosclerosis. 8. Small left inguinal hernia. Electronically Signed   By: Ilona Sorrel M.D.   On: 09/01/2015 19:23   Dg Chest 1 View  09/26/2015  CLINICAL DATA:  Status post PICC line placement. EXAM: CHEST 1 VIEW COMPARISON:  Chest radiograph 09/15/2015. FINDINGS: Right upper extremity PICC line is present with tip projecting over the superior vena cava. Stable cardiac and mediastinal contours. Low lung volumes. Bilateral lower lung heterogeneous opacities. No pleural effusion or pneumothorax. Midline surgical staple line. IMPRESSION: Right upper extremity PICC line tip projects over the superior cavoatrial junction. Basilar airspace opacities may represent atelectasis or infection. Electronically Signed   By: Lovey Newcomer M.D.   On: 09/26/2015 15:42   Dg Thoracolumabar Spine  09/07/2015  CLINICAL DATA:  Thoracic laminectomy. EXAM: THORACOLUMBAR SPINE 1V COMPARISON:  MRI earlier today. FINDINGS: Multiple intraoperative lateral films are presented  after surgery. Surgical instruments are seen in the soft tissues dorsal to the thoracic spine. A surgical instrument from posteriorly is localized to the chronic compression fracture at T12. IMPRESSION: As above. Electronically Signed   By: Staci Righter M.D.   On: 09/07/2015 23:38   Dg Abd 1 View  09/01/2015  CLINICAL DATA:  Abdominal distention. History of multiple medical problems including cirrhosis, GI bleed and renal insufficiency. EXAM: ABDOMEN - 1 VIEW COMPARISON:  Radiographs 08/27/2015.  CT 05/18/2012. FINDINGS: Portions of the abdomen are excluded from this single supine portable radiograph. There is moderate diffuse distention of the colon. There may be mild small bowel distension as well. There is no supine evidence of free intraperitoneal air. No definite signs of recurrent ascites are seen by the current examination. There are no suspicious abdominal calcifications. Compression deformities at T12, L1 and L3 appear grossly unchanged. IMPRESSION: Diffuse bowel distention, primarily appearing to reflect colon. Evaluation is limited by supine radiography, and distal colonic obstruction cannot be excluded. Consider radiographic follow up or CT for further evaluation. Electronically  Signed   By: Richardean Sale M.D.   On: 09/01/2015 12:10   Mr Brain Wo Contrast  09/06/2015  CLINICAL DATA:  New onset RIGHT leg weakness today, assess for mass or infarct. History of COPD, hypertension, cirrhosis, alcohol and substance abuse. EXAM: MRI HEAD WITHOUT CONTRAST TECHNIQUE: Multiplanar, multiecho pulse sequences of the brain and surrounding structures were obtained without intravenous contrast. Patient unable to receive contrast due to GFR of 34. COMPARISON:  CT head Dec 04, 2014 FINDINGS: Sequences are mild to moderately motion degraded. The ventricles and sulci are mildly proportionally prominent for age patient's age. No abnormal parenchymal signal, mass lesions, mass effect. Patchy supratentorial white matter  T2 hyperintensities. No reduced diffusion to suggest acute ischemia. Subcentimeter faint RIGHT mesial frontal lobe susceptibility artifact, nonspecific. No abnormal extra-axial fluid collections. No extra-axial masses though, contrast enhanced sequences would be more sensitive. Normal major intracranial vascular flow voids seen at the skull base. Ocular globes and orbital contents are unremarkable though not tailored for evaluation. No abnormal sellar expansion. No suspicious calvarial bone marrow signal. Craniocervical junction maintained. Visualized paranasal sinuses and mastoid air cells are well-aerated. Patient is edentulous. IMPRESSION: No acute intracranial process on this motion degraded examination. Mild global parenchymal brain volume loss for age. Mild chronic small vessel ischemic disease. Electronically Signed   By: Elon Alas M.D.   On: 09/06/2015 00:49   Mr Cervical Spine Wo Contrast  09/07/2015  CLINICAL DATA:  70 year old hypertensive alcoholic male of bilateral leg weakness. Staph aureus septicemia on treatment. Bilateral leg weakness. Renal failure. Anemia. Subsequent encounter. EXAM: MRI THORACIC SPINE WITHOUT CONTRAST MRI CERVICAL SPINE WITHOUT CONTRAST TECHNIQUE: Multiplanar, multisequence MR imaging of the cervical spine was performed. No intravenous contrast was administered. Multiplanar, multisequence MR imaging of the thoracic spine was performed. No intravenous contrast was administered. COMPARISON:  09/05/2015 lumbar spine MR. 09/01/2015 CT abdomen and pelvis. 12/04/2014 cervical spine CT. FINDINGS: MR CERVICAL SPINE Exam is motion degraded. Diffuse decreased signal intensity of bone marrow may be related to patient's anemia. Fracture of the anterior inferior aspect of the C2 vertebral body of indeterminate age. Mild edema surrounding the fracture site suggesting that fracture has not completely healed radiographically. Minimal incongruent at this level on the 12/04/2014 exam and  therefore fracture may be chronic although not completely healed. No adjacent prevertebral edema/hematoma as expected with an acute fracture. Nonspecific edema within paraspinal musculature C3 through C5 level on the right. This may be reflective result of acute soft tissue injury or myositis. Given the below described findings, infection not excluded although felt to be less likely consideration. Intracranial atrophy. Cervical medullary junction unremarkable. No focal cervical cord signal abnormality. C2-3: Minimal narrowing ventral thecal sac. Minimal foraminal narrowing. C3-4: Facet degenerative changes. Minimal anterior slip C3. Bulge. Narrowing ventral thecal sac. Mild bilateral foraminal narrowing. C4-5: Bulge/broad-based protrusion. Buckling posterior ligaments. Moderate spinal stenosis with circumferential moderate cord flattening. Uncinate hypertrophy and facet degenerative changes. Moderate bilateral foraminal narrowing. C5-6: Broad-based disc osteophyte complex. Spinal stenosis with mild cord flattening. Uncinate hypertrophy and facet degenerative changes. Moderate bilateral foraminal narrowing. C6-7: Facet degenerative changes greater on the left. Mild anterior slip C6. Disc osteophyte complex greater on left. Flattening of the cord greater on left. Facet degenerative changes and uncinate hypertrophy with moderate right-sided and marked left-sided foraminal narrowing. C7-T1: Remote Schmorl's node deformity T1. Facet degenerative changes. No significant spinal stenosis. Minimal foraminal narrowing. MR THORACIC SPINE Patient was not able to complete second side of axial imaging. Exam is motion  degraded. Baseline decreased signal intensity of bone marrow which may be related to patient's underlying anemia. Acute T8 compression fracture with 60% loss of height. Mild retropulsion posterior inferior aspect of the compressed vertebra. Edema extends into posterior elements. Surrounding soft tissue/fluid  collection. Of note the is an epidural collection which extends from the upper T9 level to the mid T5 level. This is greatest in a right posterior lateral position at the T5 and T6 level, greatest right lateral position at the T8-9 level and greatest in a ventral right paracentral position at the upper T9 level. This causes mass effect upon the adjacent cord which is displaced anteriorly and to left at the T5 and T6 level. Suggestion of increased signal within the compress cord which may represent edema. Etiology of the epidural process is indeterminate. In alcoholic patient, compression fracture of T8 may be related to recent fall with epidural component related to blood however patient has Staph Aures septicemia and therefore epidural abscess is also consideration. T8-9 bilateral foraminal narrowing. Retropulsion compressed T8 vertebral body contributes to spinal stenosis and cord flattening. Remote Schmorl's node deformity superior endplate T1. Remote anterior wedge compression deformity involving superior endplate T12 with 14% loss of height anteriorly and mild retropulsion posterior superior aspect with narrowing of the ventral aspect of thecal sac and mild flattening of the ventral cord which is slightly posteriorly displaced. Remote L1 superior endplate compression fracture with 20% loss of height. IMPRESSION: MR THORACIC SPINE Patient was not able to complete second side of axial imaging. Exam is motion degraded. Baseline decreased signal intensity of bone marrow which may be related to patient's underlying anemia. Acute T8 compression fracture with 60% loss of height. Mild retropulsion posterior inferior aspect of the compressed vertebra. Edema extends into posterior elements. Surrounding soft tissue/fluid collection. Spinal stenosis with mild flattening of the adjacent cord. Bilateral T8-9 foraminal narrowing. Epidural collection extends from the upper T9 level to the mid T5 level. This is greatest in a  right posterior lateral position at the T5 and T6 level, greatest right lateral position at the T8-9 level and greatest in a ventral right paracentral position at the upper T9 level. This causes mass effect upon the adjacent cord which is displaced anteriorly and to left at the T5 and T6 level. Suggestion of increased signal within the compress cord which may represent edema. Etiology of the epidural process is indeterminate. In alcoholic patient, compression fracture of T8 may be related to recent fall with epidural component related to blood however patient has Staph Aures septicemia and therefore epidural abscess is also consideration. Remote Schmorl's node deformity superior endplate T1. Remote anterior wedge compression deformity involving superior endplate T12 with 97% loss of height anteriorly and mild retropulsion posterior superior aspect with narrowing of the ventral aspect of thecal sac and mild flattening of the ventral cord which is slightly posteriorly displaced. Remote L1 superior endplate compression fracture with 20% loss of height. MR CERVICAL SPINE Exam is motion degraded. Diffuse decreased signal intensity of bone marrow may be related to patient's anemia. Fracture of the anterior inferior aspect of the C2 vertebral body of indeterminate age. Mild edema surrounding the fracture site suggesting that fracture has not completely healed radiographically. Minimal incongruent at this level on the 12/04/2014 exam and therefore fracture may be chronic although not completely healed. No adjacent prevertebral edema/hematoma as expected with an acute fracture. Nonspecific edema within paraspinal musculature C3 through C5 level on the right. This may be reflective result of acute soft  tissue injury or myositis. Given the above described findings, infection not excluded although felt to be less likely consideration. C3-4 bulge. Narrowing ventral thecal sac. Mild bilateral foraminal narrowing. C4-5  multifactorial moderate spinal stenosis with circumferential moderate cord flattening. Moderate bilateral foraminal narrowing. C5-6 broad-based disc osteophyte complex. Spinal stenosis with mild cord flattening. Uncinate hypertrophy and facet degenerative changes. Moderate bilateral foraminal narrowing. C6-7 facet degenerative changes greater on the left. Mild anterior slip C6. Disc osteophyte complex greater on left. Flattening of the cord greater on left. Facet degenerative changes and uncinate hypertrophy with moderate right-sided and marked left-sided foraminal narrowing. These results were called by telephone at the time of interpretation on 09/07/2015 at 7:56 am to Dr. Cindie Laroche who verbally acknowledged these results. Electronically Signed   By: Genia Del M.D.   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 Staph aureus septicemia on treatment. Bilateral leg weakness. Renal failure. Anemia. Subsequent encounter. EXAM: MRI THORACIC SPINE WITHOUT CONTRAST MRI CERVICAL SPINE WITHOUT CONTRAST TECHNIQUE: Multiplanar, multisequence MR imaging of the cervical spine was performed. No intravenous contrast was administered. Multiplanar, multisequence MR imaging of the thoracic spine was performed. No intravenous contrast was administered. COMPARISON:  09/05/2015 lumbar spine MR. 09/01/2015 CT abdomen and pelvis. 12/04/2014 cervical spine CT. FINDINGS: MR CERVICAL SPINE Exam is motion degraded. Diffuse decreased signal intensity of bone marrow may be related to patient's anemia. Fracture of the anterior inferior aspect of the C2 vertebral body of indeterminate age. Mild edema surrounding the fracture site suggesting that fracture has not completely healed radiographically. Minimal incongruent at this level on the 12/04/2014 exam and therefore fracture may be chronic although not completely healed. No adjacent  prevertebral edema/hematoma as expected with an acute fracture. Nonspecific edema within paraspinal musculature C3 through C5 level on the right. This may be reflective result of acute soft tissue injury or myositis. Given the below described findings, infection not excluded although felt to be less likely consideration. Intracranial atrophy. Cervical medullary junction unremarkable. No focal cervical cord signal abnormality. C2-3: Minimal narrowing ventral thecal sac. Minimal foraminal narrowing. C3-4: Facet degenerative changes. Minimal anterior slip C3. Bulge. Narrowing ventral thecal sac. Mild bilateral foraminal narrowing. C4-5: Bulge/broad-based protrusion. Buckling posterior ligaments. Moderate spinal stenosis with circumferential moderate cord flattening. Uncinate hypertrophy and facet degenerative changes. Moderate bilateral foraminal narrowing. C5-6: Broad-based disc osteophyte complex. Spinal stenosis with mild cord flattening. Uncinate hypertrophy and facet degenerative changes. Moderate bilateral foraminal narrowing. C6-7: Facet degenerative changes greater on the left. Mild anterior slip C6. Disc osteophyte complex greater on left. Flattening of the cord greater on left. Facet degenerative changes and uncinate hypertrophy with moderate right-sided and marked left-sided foraminal narrowing. C7-T1: Remote Schmorl's node deformity T1. Facet degenerative changes. No significant spinal stenosis. Minimal foraminal narrowing. MR THORACIC SPINE Patient was not able to complete second side of axial imaging. Exam is motion degraded. Baseline decreased signal intensity of bone marrow which may be related to patient's underlying anemia. Acute T8 compression fracture with 60% loss of height. Mild retropulsion posterior inferior aspect of the compressed vertebra. Edema extends into posterior elements. Surrounding soft tissue/fluid collection. Of note the is an epidural collection which extends from the upper T9  level to the mid T5 level. This is greatest in a right posterior lateral position at the T5 and T6 level, greatest right lateral position at the T8-9 level and greatest in a ventral right paracentral position  at the upper T9 level. This causes mass effect upon the adjacent cord which is displaced anteriorly and to left at the T5 and T6 level. Suggestion of increased signal within the compress cord which may represent edema. Etiology of the epidural process is indeterminate. In alcoholic patient, compression fracture of T8 may be related to recent fall with epidural component related to blood however patient has Staph Aures septicemia and therefore epidural abscess is also consideration. T8-9 bilateral foraminal narrowing. Retropulsion compressed T8 vertebral body contributes to spinal stenosis and cord flattening. Remote Schmorl's node deformity superior endplate T1. Remote anterior wedge compression deformity involving superior endplate T12 with 57% loss of height anteriorly and mild retropulsion posterior superior aspect with narrowing of the ventral aspect of thecal sac and mild flattening of the ventral cord which is slightly posteriorly displaced. Remote L1 superior endplate compression fracture with 20% loss of height. IMPRESSION: MR THORACIC SPINE Patient was not able to complete second side of axial imaging. Exam is motion degraded. Baseline decreased signal intensity of bone marrow which may be related to patient's underlying anemia. Acute T8 compression fracture with 60% loss of height. Mild retropulsion posterior inferior aspect of the compressed vertebra. Edema extends into posterior elements. Surrounding soft tissue/fluid collection. Spinal stenosis with mild flattening of the adjacent cord. Bilateral T8-9 foraminal narrowing. Epidural collection extends from the upper T9 level to the mid T5 level. This is greatest in a right posterior lateral position at the T5 and T6 level, greatest right lateral  position at the T8-9 level and greatest in a ventral right paracentral position at the upper T9 level. This causes mass effect upon the adjacent cord which is displaced anteriorly and to left at the T5 and T6 level. Suggestion of increased signal within the compress cord which may represent edema. Etiology of the epidural process is indeterminate. In alcoholic patient, compression fracture of T8 may be related to recent fall with epidural component related to blood however patient has Staph Aures septicemia and therefore epidural abscess is also consideration. Remote Schmorl's node deformity superior endplate T1. Remote anterior wedge compression deformity involving superior endplate T12 with 26% loss of height anteriorly and mild retropulsion posterior superior aspect with narrowing of the ventral aspect of thecal sac and mild flattening of the ventral cord which is slightly posteriorly displaced. Remote L1 superior endplate compression fracture with 20% loss of height. MR CERVICAL SPINE Exam is motion degraded. Diffuse decreased signal intensity of bone marrow may be related to patient's anemia. Fracture of the anterior inferior aspect of the C2 vertebral body of indeterminate age. Mild edema surrounding the fracture site suggesting that fracture has not completely healed radiographically. Minimal incongruent at this level on the 12/04/2014 exam and therefore fracture may be chronic although not completely healed. No adjacent prevertebral edema/hematoma as expected with an acute fracture. Nonspecific edema within paraspinal musculature C3 through C5 level on the right. This may be reflective result of acute soft tissue injury or myositis. Given the above described findings, infection not excluded although felt to be less likely consideration. C3-4 bulge. Narrowing ventral thecal sac. Mild bilateral foraminal narrowing. C4-5 multifactorial moderate spinal stenosis with circumferential moderate cord flattening.  Moderate bilateral foraminal narrowing. C5-6 broad-based disc osteophyte complex. Spinal stenosis with mild cord flattening. Uncinate hypertrophy and facet degenerative changes. Moderate bilateral foraminal narrowing. C6-7 facet degenerative changes greater on the left. Mild anterior slip C6. Disc osteophyte complex greater on left. Flattening of the cord greater on left. Facet degenerative changes and  uncinate hypertrophy with moderate right-sided and marked left-sided foraminal narrowing. These results were called by telephone at the time of interpretation on 09/07/2015 at 7:56 am to Dr. Cindie Laroche who verbally acknowledged these results. Electronically Signed   By: Genia Del M.D.   On: 09/07/2015 08:26   Mr Lumbar Spine Wo Contrast  09/06/2015  CLINICAL DATA:  New onset RIGHT leg weakness today, assess for mass or infarct. History of COPD, hypertension, cirrhosis, alcohol and substance abuse. EXAM: MRI LUMBAR SPINE WITHOUT CONTRAST TECHNIQUE: Multiplanar, multisequence MR imaging of the lumbar spine was performed. No intravenous contrast was administered. No contrast administered due to GFR of 34. COMPARISON:  CT abdomen and pelvis August 31, 2005 FINDINGS: Sagittal sequences are mildly motion degraded, axial sequences are moderately motion degraded. Using the reference level of the last well-formed intervertebral disc as L5-S1, moderate chronic T12 burst fracture, mild chronic L1 compression fracture. Moderate to severe L3 central height loss, with L3 superior and inferior endplate large Schmorl's nodes. Mild to moderate L5 superior endplate compression fracture with Schmorl's node. Additional chronic Schmorl's nodes. Mild L5-S1 disc height loss, with slight desiccation the lower lumbar discs. Old the level mild subacute to chronic discogenic endplate changes without convincing STIR signal abnormality to suggest acute osseous process. Subcentimeter S2 probable atypical hemangioma. Conus medullaris terminates  at L1. Limited assessment of cauda equina due to motion. Mild epidural lipomatosis. Moderate symmetric paraspinal muscle T2 bright signal without atrophy. T11-12: Small amount of retropulsed bony fragments. Mild canal stenosis without neural foraminal narrowing. T12-L1: No significant disc bulge. Mild facet arthropathy and ligamentum flavum redundancy without canal stenosis or neural foraminal narrowing. L1-2: No significant disc bulge. Mild facet arthropathy and ligamentum flavum redundancy without canal stenosis or neural foraminal narrowing. L2-3: Small broad-based disc bulge, mild facet arthropathy and ligamentum flavum redundancy. Mild canal stenosis and partial effacement of thecal sac due to epidural fat. No neural foraminal narrowing. L3-4: Small broad-based disc osteophyte complex, mild facet arthropathy and ligamentum flavum redundancy. Mild canal stenosis and partial effacement of the thecal sac due to epidural fat. Moderate RIGHT, mild LEFT neural foraminal narrowing. L4-5: Small broad-based disc bulge. Moderate facet arthropathy and ligamentum flavum redundancy. No canal stenosis, partial effacement thecal sac due to epidural fat. Mild to moderate LEFT neural foraminal narrowing. L5-S1: Small broad-based disc bulge. Moderate facet arthropathy and ligamentum flavum redundancy without canal stenosis. Moderate RIGHT neural foraminal narrowing. IMPRESSION: Motion degraded examination. Degenerative change of the lumbar spine, superimposed on of epidural lipomatosis without definite epidural abscess on noncontrast examination. Multiple old fractures including moderate to severe L3 compression fracture. Mild canal stenosis L2-3 and L3-4. Neural foraminal narrowing L3-4 through L5-S1: Moderate on the RIGHT at L3-4 and L5-S1. Electronically Signed   By: Elon Alas M.D.   On: 09/06/2015 01:11   US Abdomen Limited  09/17/2015  CLINICAL DATA:  Ascites, cirrhosis, COPD, hypertension, MRSA, current epidural  abscess post surgery EXAM: LIMITED ABDOMEN ULTRASOUND FOR ASCITES TECHNIQUE: Limited ultrasound survey for ascites was performed in all four abdominal quadrants. COMPARISON:  CT abdomen and pelvis 09/15/2015 FINDINGS: Small volumes of ascites are seen in the flanks bilaterally. Fluid volume is substantially less than expected from the prior CT exam likely due to interval mobilization of fluid and diuresis. Fluid is scattered from the flanks to perihepatic region and pelvis. Volume of fluid visualized is subjectively insufficient for expected significant therapeutic benefit. IMPRESSION: Scattered small amounts of ascites, substantially less than expected from prior CT exam, likely due to  interval fluid mobilization and diuresis. Electronically Signed   By: Lavonia Dana M.D.   On: 09/17/2015 12:03   Dg Abd 2 Views  09/26/2015  CLINICAL DATA:  Abdominal distention. Evaluate ileus and cecal diameter. EXAM: ABDOMEN - 2 VIEW COMPARISON:  09/15/2015 FINDINGS: Improving gaseous distention of bowel. Gas within mildly prominent small bowel loops in the left abdomen an nondistended colon. Cecum not currently visualized or measurable. No free air organomegaly. IMPRESSION: Improving gaseous distention of bowel. No current evidence of obstruction or ileus. Electronically Signed   By: Rolm Baptise M.D.   On: 09/26/2015 14:09   Dg Abd Acute W/chest  09/15/2015  CLINICAL DATA:  Back surgery last week. Currently it rehab dilatation. Increasing abdominal bloating for 3 days. Diffuse abdominal pain and swelling. EXAM: DG ABDOMEN ACUTE W/ 1V CHEST COMPARISON:  Abdomen 09/01/2015.  Chest 08/28/2015. FINDINGS: Right PICC line is been placed with tip over the cavoatrial junction region. Shallow inspiration with linear atelectasis in the lung bases, demonstrating improvement since previous study. Normal heart size and pulmonary vascularity. No blunting of costophrenic angles. No pneumothorax. Tortuous aorta. Skin clips over the midline  consistent with recent surgery. Gaseous distention of colon and mid abdominal small bowel. No free pattern. No abnormal air-fluid levels. Changes most likely represent ileus. No radiopaque stones. Calcified phleboliths in the pelvis. Degenerative changes in the spine. IMPRESSION: Improving atelectasis in the lung bases. Diffuse gas distention of small and large bowel most likely to represent ileus. Electronically Signed   By: Lucienne Capers M.D.   On: 09/15/2015 05:28    Microbiology: No results found for this or any previous visit (from the past 240 hour(s)).   Labs: Basic Metabolic Panel:  Recent Labs Lab 09/24/15 0534 09/25/15 0614 09/26/15 0542 09/27/15 0548 09/29/15 0600  NA 136 136 137 137 136  K 2.9* 3.4* 4.3 4.0 3.7  CL 104 103 104 101 96*  CO2 _0 33*  GLUCOSE 106* 89 96 94 84  BUN 40* 37* 37* 37* 33*  CREATININE 1.98* 1.86* 1.97* 1.77* 1.75*  CALCIUM 7.3* 7.6* 7.6* 7.6* 7.5*  PHOS  --   --  2.9  --  3.0   Liver Function Tests:  Recent Labs Lab 09/26/15 0542 09/27/15 0548 09/29/15 0600  AST  --  70*  --   ALT  --  29  --   ALKPHOS  --  78  --   BILITOT  --  1.1  --   PROT  --  5.7*  --   ALBUMIN 2.0* 2.0* 1.9*   No results for input(s): LIPASE, AMYLASE in the last 168 hours. No results for input(s): AMMONIA in the last 168 hours. CBC:  Recent Labs Lab 09/24/15 0534 09/25/15 0614 09/26/15 0542 09/27/15 0548 09/29/15 0600  WBC 8.9 9.7 9.8 9.9 7.3  HGB 8.1* 8.3* 8.2* 8.9* 8.8*  HCT 23.5* 24.9* 24.8* 26.5* 26.3*  MCV 97.5 98.0 99.6 100.0 99.2  PLT 96* 117* 124* 134* 114*   Cardiac Enzymes: No results for input(s): CKTOTAL, CKMB, CKMBINDEX, TROPONINI in the last 168 hours. BNP: BNP (last 3 results)  Recent Labs  09/12/15 1345  BNP 57.1    ProBNP (last 3 results) No results for input(s): PROBNP in the last 8760 hours.  CBG: No results for input(s): GLUCAP in the last 168 hours.     Signed:  Faduma Cho Jerilynn Mages  [facility] Pager: [phone] 09/30/2015, 1:05 PM

## 2015-09-30 NOTE — Progress Notes (Signed)
Roberto [name]  MRN: [id number]  DOB/AGE: 1945-12-08 70 y.o.  Primary Care Physician:DONDIEGO,RICHARD M, MD  Admit date: 09/15/2015  Chief Complaint:  Chief Complaint  Patient presents with  . Abdominal Pain    S-Pt presented on  09/15/2015 with  Chief Complaint  Patient presents with  . Abdominal Pain  .    Pt main concern was " I am going to rehab"     Meds . sodium chloride   Intravenous Once  . citalopram  20 mg Oral Daily  . feeding supplement (ENSURE ENLIVE)  237 mL Oral BID BM  . furosemide  60 mg Intravenous Q12H  . guaiFENesin  600 mg Oral BID  . magnesium oxide  400 mg Oral BID  . nystatin   Topical TID  . oxyCODONE  5 mg Oral TID WC & HS  . pantoprazole  40 mg Oral BID  . polyethylene glycol  17 g Oral QHS  . potassium chloride  40 mEq Oral Daily  . rifaximin  550 mg Oral BID  . spironolactone  50 mg Oral Daily  . sucralfate  1 g Oral TID WC & HS  . tamsulosin  0.4 mg Oral Daily  . vancomycin  1,500 mg Intravenous Q48H  . warfarin  2 mg Oral Once  . Warfarin - Pharmacist Dosing Inpatient   Does not apply Q24H     Physical Exam: Vital signs in last 24 hours: Temp:  [98.5 F (36.9 C)-98.8 F (37.1 C)] 98.5 F (36.9 C) (03/02 0530) Pulse Rate:  [78-118] 78 (03/02 0530) Resp:  [20] 20 (03/02 0530) BP: (96-102)/(41-57) 100/41 mmHg (03/02 0530) SpO2:  [91 %-95 %] 92 % (03/02 1247) Weight change:  Last BM Date: 09/29/15  Intake/Output from previous day: 03/01 0701 - 03/02 0700 In: 4123.3 [P.O.:360; I.V.:2263.3; IV Piggyback:1500] Out: 4700 [Urine:4700]     Physical Exam: General- pt is awake,alert,follows commands  Resp- No acute REsp distress, decreased bs at bases. CVS- S1S2 regular in rate and rhythm GIT- BS+, soft, NT, Distended+  EXT-2+ LE Edema, NO Cyanosis   Lab Results: CBC  Recent Labs  09/29/15 0600  WBC 7.3  HGB 8.8*  HCT 26.3*  PLT 114*    BMET  Recent Labs  09/29/15 0600  NA 136  K 3.7  CL 96*  CO2 33*   GLUCOSE 84  BUN 33*  CREATININE 1.75*  CALCIUM 7.5*    MICRO No results found for this or any previous visit (from the past 240 hour(s)).    Lab Results  Component Value Date   CALCIUM 7.5* 09/29/2015   CAION 1.00* 09/07/2015   PHOS 3.0 09/29/2015      Impression: 1)Renal  AKI secondary to Mutliple factors                AKI sec to   Prerenal/ATN.                AKI now better                Creat stable               2)CVs-hemodynamically stable Medication- On Diuretics  3)Anemia HGb low but stable   4)ID-hx of Epidural abscess On IV vanco  5)Liver- hx of Cirrhosis GI and Primary MD following  6)Electrolytes  Normokalemic Normonatremic   7)Acid base Co2 at goal     Plan:  Will continue current care Pt may benefit from nephrology follow up as out pt.  Merlyn Conley S 09/30/2015, 12:55 PM

## 2015-09-30 NOTE — Clinical Social Work Note (Signed)
CSW facilitated discharge.  CSW notified patient that his bed at [facility] was no longer available. Patient was agreeable to go to [facility]. Patient called his brother and notified him of his discharge and new placement location.   CSW notified Thayer Ohm at [facility] that patient was discharging and going to be transported to facility by [facility]. CSW sent clinicals via Epic hub.  CSW arranged transportation.  CSW signing off.  Maely Clements, Juleen China, LCSW

## 2015-09-30 NOTE — Progress Notes (Signed)
Daily Progress Note   Patient Name: Roberto [name]       Date: 09/30/2015 DOB: 30-Dec-1945  Age: 70 y.o. MRN#: [id number] Attending Physician: Oval Linsey, MD Primary Care Physician: Isabella Stalling, MD Admit Date: 09/15/2015  Reason for Consultation/Follow-up: Disposition, Establishing goals of care, Psychosocial/spiritual support and Terminal Care  Subjective: Roberto [name] is resting quietly in bed as I enter today.  I try to gently wake him, but he continues to sleep.  Call from his son [name] and Craig's wife. I share my concerns about Roberto [name] chronic health problems and current epidural abscess. We talk about continuing IV vancomycin per [facility] line for an additional 8 weeks in SNF.  We discussed the risks of long-term antibiotic use including C diff and yeast.  We talk about Roberto [name] COPD, hypertension, ascites, swelling, low-protein and pain.   Daughter-in-law asks if this is an appropriate time for hospice.  We talk about functional status being an indicator for hospice for Roberto [name].  We talk about return to a long-term care facility, and [facility] does not have a bed for them at this time. Craig's wife asks where Roberto [name] Social Security check has been going for this past 3 to 4 weeks while he's been in the hospital. I share that this is something she needs to discuss with [facility].  We talk about prognosis, and that I would not be surprised for Roberto [name] to pass within the next 4 to 6 months. We talk about the concepts of comfort care, focusing on pain and symptom management.  [name] becomes tearful at times.  He asks [place] to pray for Roberto [name].   Length of Stay: 15 days  Current Medications: Scheduled Meds:  . sodium chloride   Intravenous Once  . citalopram  20 mg Oral Daily  .  feeding supplement (ENSURE ENLIVE)  237 mL Oral BID BM  . furosemide  60 mg Intravenous Q12H  . guaiFENesin  600 mg Oral BID  . magnesium oxide  400 mg Oral BID  . nystatin   Topical TID  . oxyCODONE  5 mg Oral TID WC & HS  . pantoprazole  40 mg Oral BID  . polyethylene glycol  17 g Oral QHS  . potassium chloride  40 mEq Oral Daily  . rifaximin  550  mg Oral BID  . spironolactone  50 mg Oral Daily  . sucralfate  1 g Oral TID WC & HS  . tamsulosin  0.4 mg Oral Daily  . vancomycin  1,500 mg Intravenous Q48H  . Warfarin - Pharmacist Dosing Inpatient   Does not apply Q24H    Continuous Infusions: . sodium chloride 50 mL/hr at 09/28/15 2024    PRN Meds: albuterol, bisacodyl, LORazepam, ondansetron **OR** ondansetron (ZOFRAN) IV, oxyCODONE, polyvinyl alcohol  Physical Exam: Physical Exam  Nursing note and vitals reviewed.               Vital Signs: BP 93/48 mmHg  Pulse 82  Temp(Src) 98.3 F (36.8 C) (Oral)  Resp 20  Ht  (1.854 m)  Wt 120.8 kg (266 lb 5.1 oz)  BMI 35.14 kg/m2  SpO2 91% SpO2: SpO2: 91 % O2 Device: O2 Device: Not Delivered O2 Flow Rate: O2 Flow Rate (L/min): 1 L/min  Intake/output summary:  Intake/Output Summary (Last 24 hours) at 09/30/15 1756 Last data filed at 09/30/15 0532  Gross per 24 hour  Intake 3763.33 ml  Output   2000 ml  Net 1763.33 ml   LBM: Last BM Date: 09/30/15 Baseline Weight: Weight: 108.863 kg (240 lb) Most recent weight: Weight: 120.8 kg (266 lb 5.1 oz)       Palliative Assessment/Data:   Additional Data Reviewed: CBC    Component Value Date/Time   WBC 7.3 09/29/2015 0600   RBC 2.65* 09/29/2015 0600   RBC 1.36* 12/13/2011 1145   HGB 8.8* 09/29/2015 0600   HCT 26.3* 09/29/2015 0600   PLT 114* 09/29/2015 0600   MCV 99.2 09/29/2015 0600   MCH 33.2 09/29/2015 0600   MCHC 33.5 09/29/2015 0600   RDW 21.6* 09/29/2015 0600   LYMPHSABS 1.8 09/15/2015 0433   MONOABS 1.4* 09/15/2015 0433   EOSABS 0.3 09/15/2015 0433    BASOSABS 0.1 09/15/2015 0433    CMP     Component Value Date/Time   NA 136 09/29/2015 0600   K 3.7 09/29/2015 0600   CL 96* 09/29/2015 0600   CO2 33* 09/29/2015 0600   GLUCOSE 84 09/29/2015 0600   BUN 33* 09/29/2015 0600   CREATININE 1.75* 09/29/2015 0600   CREATININE 1.02 07/22/2015 1150   CALCIUM 7.5* 09/29/2015 0600   PROT 5.7* 09/27/2015 0548   ALBUMIN 1.9* 09/29/2015 0600   AST 70* 09/27/2015 0548   ALT 29 09/27/2015 0548   ALKPHOS 78 09/27/2015 0548   BILITOT 1.1 09/27/2015 0548   GFRNONAA 39* 09/29/2015 0600   GFRAA 45* 09/29/2015 0600       Problem List:  Patient Active Problem List   Diagnosis Date Noted  . Palliative care encounter   . DNR (do not resuscitate) discussion   . Ileus (HCC) 09/15/2015  . Abdominal pain 09/15/2015  . Cirrhosis of liver with ascites (HCC)   . MRSA infection   . Malnutrition of moderate degree 09/13/2015  . Chronic diastolic heart failure (HCC) 09/12/2015  . 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 ETOH abuse 08/27/2015  . Alcohol dependence with uncomplicated withdrawal (HCC)   . 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 Alcohol dependence (HCC) 06/02/2015  . Acute encephalopathy 09/20/2014  . Alcoholic ketoacidosis 09/20/2014  . Delirium tremens (HCC) 09/19/2014  . Hypokalemia 05/20/2012  . Acute respiratory failure (HCC) 05/19/2012  . Severe protein-calorie malnutrition (HCC) 05/19/2012  . Sepsis (HCC) 05/19/2012  . Alcohol withdrawal (HCC) 05/18/2012  . Encephalopathy 05/18/2012  .  Dyspnea 05/18/2012  . Ascites 05/18/2012  . Volume overload 05/18/2012  . Helicobacter pylori antibody positive 12/19/2011  . Adynamic ileus (HCC) 12/18/2011  . 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 Hx of substance abuse 12/17/2011  . Cirrhosis (HCC) 12/16/2011  . Abdominal pain, diffuse 12/15/2011  . Thrombocytopenia (HCC) 12/15/2011  . Duodenal ulcer with hemorrhage  12/15/2011  . Esophageal varices without bleeding (HCC) 12/15/2011  . Hepatic steatosis 12/14/2011  . Compression fracture of L1 lumbar vertebra (HCC) 12/14/2011  . Hypocalcemia 12/14/2011  . Acute renal failure (HCC) 12/14/2011  . Hypernatremia 12/14/2011  . Hypotension 12/14/2011  . Hyperglycemia 12/14/2011  . Alcoholic hepatitis 12/14/2011  . GI bleed 12/13/2011  . Acute blood loss anemia 12/13/2011  . Alcohol abuse 12/13/2011  . Jaundice 12/13/2011  . Coagulopathy (HCC) 12/13/2011  . Macrocytic anemia 12/13/2011  . Renal insufficiency 12/13/2011  . Hepatic encephalopathy (HCC) 12/13/2011  . Debility 12/13/2011  . Frequent falls 12/13/2011  . Pneumonia 08/17/2011  . HTN (hypertension) 08/17/2011  . COPD (chronic obstructive pulmonary disease) (HCC) 08/17/2011  . Smoker, current status unknown 08/17/2011     Palliative Care Assessment & Plan    1.Code Status:  DNR - ALLOW A NATURAL DEATH, family asks about the option of Hospice in the future.     Code Status Orders        Start     Ordered   09/29/15 1410  Do not attempt resuscitation (DNR)   Continuous    Question Answer Comment  In the event of cardiac or respiratory ARREST Do not call a "code blue"   In the event of cardiac or respiratory ARREST Do not perform Intubation, CPR, defibrillation or ACLS   In the event of cardiac or respiratory ARREST Use medication by any route, position, wound care, and other measures to relive pain and suffering. May use oxygen, suction and manual treatment of airway obstruction as needed for comfort.      09/29/15 1409    Code Status History    Date Active Date Inactive Code Status Order ID Comments User Context   09/15/2015 10:45 AM 09/29/2015  2:09 PM Full Code [id number]  Houston Siren, MD Inpatient   09/09/2015 10:21 AM 09/13/2015 10:38 PM Full Code [id number]  Hollice Espy, MD Inpatient   08/27/2015  5:05 PM 09/08/2015  2:54 AM Full Code [id number]  Henderson Cloud, MD  Inpatient   06/02/2015  3:46 PM 06/10/2015  6:19 PM Full Code [id number]  Rachael Fee, MD Inpatient   09/19/2014  9:30 PM 09/21/2014  8:54 PM Full Code [id number]  Levie Heritage, DO Inpatient   05/18/2012  5:38 PM 05/22/2012  5:37 PM Full Code [id number]  Erick Blinks, MD Inpatient   12/13/2011  2:58 PM 12/21/2011  6:08 PM Full Code [id number]  Lovena Neighbours, RN ED   08/17/2011  5:46 PM 08/18/2011 12:56 PM Full Code [id number]  Quitman Livings, RN Inpatient    Advance Directive Documentation        Most Recent Value   Type of Advance Directive  Living will   Pre-existing out of facility DNR order (yellow form or pink MOST form)     "MOST" Form in Place?         2. Goals of Care/Additional Recommendations:  Treat the treatable at this time.  We discuss watching for functional status declines as indicator for Hospice.   Limitations on Scope of Treatment: Full Scope Treatment at  this time. Aware of Hospice in the future.   Desire for further Chaplaincy support:Ongoing   Psycho-social Needs: None at this time.   3. Symptom Management:      1.per Dr. Delbert Harness  4. Palliative Prophylaxis:   Frequent Pain Assessment and Turn Reposition  5. Prognosis: Unable to determine, likely for months or less due to compromised health/chronic health concerns.   6. Discharge Planning:  Skilled Nursing Facility for rehab with Palliative care service follow-up   Care plan was discussed with nursing staff, CM, SW, and Dr. Delbert Harness on next rounds.   Thank you for allowing the Palliative Medicine Team to assist in the care of this patient.   Time In: 1710 Time Out: 1800 Total Time  50 minutes Prolonged Time Billed  yes         Katheran Awe, NP  09/30/2015, 5:56 PM  Please contact Palliative Medicine Team phone at [phone] for questions and concerns.

## 2015-09-30 NOTE — Clinical Social Work Note (Signed)
CSW updated spoke with Debbie at [facility] and provided status update. She advised that she needed to know if patient was being discharged today because patient's bed may be given away today if he isn't discharged today.     Annice Needy, [place] [phone]

## 2015-09-30 NOTE — Care Management Note (Signed)
Case Management Note  Patient Details  Name: Roberto [name] MRN: [id number] Date of Birth: 04-29-1946  Expected Discharge Date:                  Expected Discharge Plan:  Skilled Nursing Facility  In-House Referral:  Clinical Social Work  Discharge planning Services  CM Consult  Post Acute Care Choice:  NA Choice offered to:  NA  DME Arranged:    DME Agency:     HH Arranged:    HH Agency:     Status of Service:  Completed, signed off  Medicare Important Message Given:  Yes Date Medicare IM Given:    Medicare IM give by:    Date Additional Medicare IM Given:    Additional Medicare Important Message give by:     If discussed at Long Length of Stay Meetings, dates discussed:  09/30/2015  Additional Comments: Pt discharging to SNF today. Pt lost bed at [facility] and CSW has arranged for placement elsewhere. Pt is aware of new DC plan and is agreeable. No CM needs.  Malcolm Metro, RN 09/30/2015, 4:11 PM

## 2015-09-30 NOTE — Care Management Important Message (Signed)
Important Message  Patient Details  Name: Roberto [name] MRN: [id number] Date of Birth: October 25, 1945   Medicare Important Message Given:  Yes    Malcolm Metro, RN 09/30/2015, 4:11 PM

## 2015-09-30 NOTE — Progress Notes (Signed)
Pt's foley catheter removed and intact. Pt's PICC line clean dry and intact. Pt will be transferred to [facility] and will be escorted by [facility]. Pt in stable condition and in no acute distress at this time. Report called and given to Eliberto Ivory at [facility]. All questions were answered and no further questions at this time.

## 2015-09-30 NOTE — NC FL2 (Signed)
[place] MEDICAID FL2 LEVEL OF CARE SCREENING TOOL     IDENTIFICATION  Patient Name: REBECCA MOTTA Birthdate: April 04, 1946 Sex: male Admission Date (Current Location): 09/15/2015  [facility] and [place] Number:  [employer] and Address:  [facility],  [address] [place] [address]      Provider Number: [id number]  Attending Physician Name and Address:  Oval Linsey, MD  Relative Name and Phone Number:       Current Level of Care: Hospital Recommended Level of Care: Skilled Nursing Facility Prior Approval Number:    Date Approved/Denied:   PASRR Number:    Discharge Plan: SNF    Current Diagnoses: Patient Active Problem List   Diagnosis Date Noted  . Palliative care encounter   . DNR (do not resuscitate) discussion   . Ileus (HCC) 09/15/2015  . Abdominal pain 09/15/2015  . Cirrhosis of liver with ascites (HCC)   . MRSA infection   . Malnutrition of moderate degree 09/13/2015  . Chronic diastolic heart failure (HCC) 09/12/2015  . 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 ETOH abuse 08/27/2015  . Alcohol dependence with uncomplicated withdrawal (HCC)   . 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 Alcohol dependence (HCC) 06/02/2015  . Acute encephalopathy 09/20/2014  . Alcoholic ketoacidosis 09/20/2014  . Delirium tremens (HCC) 09/19/2014  . Hypokalemia 05/20/2012  . Acute respiratory failure (HCC) 05/19/2012  . Severe protein-calorie malnutrition (HCC) 05/19/2012  . Sepsis (HCC) 05/19/2012  . Alcohol withdrawal (HCC) 05/18/2012  . Encephalopathy 05/18/2012  . Dyspnea 05/18/2012  . Ascites 05/18/2012  . Volume overload 05/18/2012  . Helicobacter pylori antibody positive 12/19/2011  . Adynamic ileus (HCC) 12/18/2011  . 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 Hx of substance abuse 12/17/2011  . Cirrhosis (HCC) 12/16/2011  . Abdominal pain, diffuse 12/15/2011  .  Thrombocytopenia (HCC) 12/15/2011  . Duodenal ulcer with hemorrhage 12/15/2011  . Esophageal varices without bleeding (HCC) 12/15/2011  . Hepatic steatosis 12/14/2011  . Compression fracture of L1 lumbar vertebra (HCC) 12/14/2011  . Hypocalcemia 12/14/2011  . Acute renal failure (HCC) 12/14/2011  . Hypernatremia 12/14/2011  . Hypotension 12/14/2011  . Hyperglycemia 12/14/2011  . Alcoholic hepatitis 12/14/2011  . GI bleed 12/13/2011  . Acute blood loss anemia 12/13/2011  . Alcohol abuse 12/13/2011  . Jaundice 12/13/2011  . Coagulopathy (HCC) 12/13/2011  . Macrocytic anemia 12/13/2011  . Renal insufficiency 12/13/2011  . Hepatic encephalopathy (HCC) 12/13/2011  . Debility 12/13/2011  . Frequent falls 12/13/2011  . Pneumonia 08/17/2011  . HTN (hypertension) 08/17/2011  . COPD (chronic obstructive pulmonary disease) (HCC) 08/17/2011  . Smoker, current status unknown 08/17/2011    Orientation RESPIRATION BLADDER Height & Weight     Self, Situation, Place  Normal Incontinent Weight: 266 lb 5.1 oz (120.8 kg) Height:   (185.4 cm)  BEHAVIORAL SYMPTOMS/MOOD NEUROLOGICAL BOWEL NUTRITION STATUS      Continent  (Diet 2 gram sodium.)  AMBULATORY STATUS COMMUNICATION OF NEEDS Skin   Extensive Assist   Normal                       Personal Care Assistance Level of Assistance  Bathing, Dressing           Functional Limitations Info             SPECIAL CARE FACTORS FREQUENCY  PT (By licensed PT), OT (By licensed OT)  Contractures      Additional Factors Info  Isolation Precautions               Current Medications (09/30/2015):  This is the current hospital active medication list Current Facility-Administered Medications  Medication Dose Route Frequency Provider Last Rate Last Dose  . 0.9 %  sodium chloride infusion   Intravenous Once Oval Linsey, MD 10 mL/hr at 09/22/15 1138    . 0.9 %  sodium chloride infusion   Intravenous  Continuous Oval Linsey, MD 50 mL/hr at 09/28/15 2024    . albuterol (PROVENTIL) (2.5 MG/3ML) 0.083% nebulizer solution 2.5 mg  2.5 mg Nebulization Q6H PRN Houston Siren, MD   2.5 mg at 09/30/15 1247  . bisacodyl (DULCOLAX) suppository 10 mg  10 mg Rectal Daily PRN Houston Siren, MD      . citalopram (CELEXA) tablet 20 mg  20 mg Oral Daily Oval Linsey, MD   20 mg at 09/30/15 0836  . feeding supplement (ENSURE ENLIVE) (ENSURE ENLIVE) liquid 237 mL  237 mL Oral BID BM Oval Linsey, MD   237 mL at 09/30/15 1400  . furosemide (LASIX) injection 60 mg  60 mg Intravenous Q12H Oval Linsey, MD   60 mg at 09/30/15 1211  . guaiFENesin (MUCINEX) 12 hr tablet 600 mg  600 mg Oral BID Oval Linsey, MD   600 mg at 09/30/15 0837  . LORazepam (ATIVAN) injection 1 mg  1 mg Intravenous Q4H PRN Houston Siren, MD   1 mg at 09/30/15 0049  . magnesium oxide (MAG-OX) tablet 400 mg  400 mg Oral BID Oval Linsey, MD   400 mg at 09/30/15 1610  . nystatin (MYCOSTATIN/NYSTOP) topical powder   Topical TID Katheran Awe, NP      . ondansetron (ZOFRAN) tablet 4 mg  4 mg Oral Q6H PRN Houston Siren, MD       Or  . ondansetron [facility]) injection 4 mg  4 mg Intravenous Q6H PRN Houston Siren, MD   4 mg at 09/24/15 0142  . oxyCODONE (Oxy IR/ROXICODONE) immediate release tablet 5 mg  5 mg Oral TID PRN Oval Linsey, MD   5 mg at 09/30/15 0523  . oxyCODONE (Oxy IR/ROXICODONE) immediate release tablet 5 mg  5 mg Oral TID WC & HS Katheran Awe, NP   5 mg at 09/30/15 1213  . pantoprazole (PROTONIX) EC tablet 40 mg  40 mg Oral BID Corbin Ade, MD   40 mg at 09/30/15 9604  . polyethylene glycol (MIRALAX / GLYCOLAX) packet 17 g  17 g Oral QHS Malissa Hippo, MD   17 g at 09/29/15 2247  . polyvinyl alcohol (LIQUIFILM TEARS) 1.4 % ophthalmic solution 1 drop  1 drop Both Eyes PRN Houston Siren, MD      . potassium chloride SA (K-DUR,KLOR-CON) CR tablet 40 mEq  40 mEq Oral Daily Gerome Apley, PA-C   40 mEq at 09/30/15 0836  . rifaximin (XIFAXAN)  tablet 550 mg  550 mg Oral BID West Bali, MD   550 mg at 09/30/15 0836  . spironolactone (ALDACTONE) tablet 50 mg  50 mg Oral Daily Salomon Mast, MD   50 mg at 09/30/15 0836  . sucralfate (CARAFATE) 1 GM/10ML suspension 1 g  1 g Oral TID WC & HS Malissa Hippo, MD   1 g at 09/30/15 1211  . tamsulosin (FLOMAX) capsule 0.4 mg  0.4 mg Oral Daily Oval Linsey, MD   0.4 mg at 09/30/15 0837  .  vancomycin (VANCOCIN) 1,500 mg in sodium chloride 0.9 % 500 mL IVPB  1,500 mg Intravenous Q48H Oval Linsey, MD   1,500 mg at 09/30/15 0522  . warfarin (COUMADIN) tablet 2 mg  2 mg Oral Once Oval Linsey, MD      . Warfarin - Pharmacist Dosing Inpatient   Does not apply Q24H Oval Linsey, MD         Discharge Medications: Please see discharge summary for a list of discharge medications.  Relevant Imaging Results:  Relevant Lab Results:   Additional Information    Miyako Oelke, Juleen China, LCSW

## 2015-10-08 ENCOUNTER — Emergency Department (HOSPITAL_COMMUNITY): Payer: Medicare Other

## 2015-10-08 ENCOUNTER — Inpatient Hospital Stay (HOSPITAL_COMMUNITY)
Admission: EM | Admit: 2015-10-08 | Discharge: 2015-10-15 | DRG: 682 | Disposition: A | Discharge status: Skilled Nursing Facility | Payer: Medicare Other | Attending: Family Medicine | Admitting: Family Medicine

## 2015-10-08 ENCOUNTER — Telehealth: Payer: Self-pay | Admitting: Internal Medicine

## 2015-10-08 ENCOUNTER — Encounter (HOSPITAL_COMMUNITY): Payer: Self-pay | Admitting: Emergency Medicine

## 2015-10-08 ENCOUNTER — Inpatient Hospital Stay (HOSPITAL_COMMUNITY): Payer: Medicare Other

## 2015-10-08 DIAGNOSIS — Z7189 Other specified counseling: Secondary | ICD-10-CM | POA: Diagnosis not present

## 2015-10-08 DIAGNOSIS — K219 Gastro-esophageal reflux disease without esophagitis: Secondary | ICD-10-CM | POA: Diagnosis present

## 2015-10-08 DIAGNOSIS — N179 Acute kidney failure, unspecified: Secondary | ICD-10-CM | POA: Diagnosis present

## 2015-10-08 DIAGNOSIS — R109 Unspecified abdominal pain: Secondary | ICD-10-CM | POA: Diagnosis not present

## 2015-10-08 DIAGNOSIS — Z7901 Long term (current) use of anticoagulants: Secondary | ICD-10-CM

## 2015-10-08 DIAGNOSIS — D696 Thrombocytopenia, unspecified: Secondary | ICD-10-CM | POA: Diagnosis present

## 2015-10-08 DIAGNOSIS — Z955 Presence of coronary angioplasty implant and graft: Secondary | ICD-10-CM | POA: Diagnosis not present

## 2015-10-08 DIAGNOSIS — Z8711 Personal history of peptic ulcer disease: Secondary | ICD-10-CM

## 2015-10-08 DIAGNOSIS — K7031 Alcoholic cirrhosis of liver with ascites: Secondary | ICD-10-CM | POA: Diagnosis not present

## 2015-10-08 DIAGNOSIS — J449 Chronic obstructive pulmonary disease, unspecified: Secondary | ICD-10-CM | POA: Diagnosis present

## 2015-10-08 DIAGNOSIS — F17210 Nicotine dependence, cigarettes, uncomplicated: Secondary | ICD-10-CM | POA: Diagnosis present

## 2015-10-08 DIAGNOSIS — R32 Unspecified urinary incontinence: Secondary | ICD-10-CM | POA: Diagnosis present

## 2015-10-08 DIAGNOSIS — F419 Anxiety disorder, unspecified: Secondary | ICD-10-CM | POA: Diagnosis present

## 2015-10-08 DIAGNOSIS — Z8614 Personal history of Methicillin resistant Staphylococcus aureus infection: Secondary | ICD-10-CM | POA: Diagnosis not present

## 2015-10-08 DIAGNOSIS — Z66 Do not resuscitate: Secondary | ICD-10-CM | POA: Diagnosis present

## 2015-10-08 DIAGNOSIS — F329 Major depressive disorder, single episode, unspecified: Secondary | ICD-10-CM | POA: Diagnosis present

## 2015-10-08 DIAGNOSIS — G062 Extradural and subdural abscess, unspecified: Secondary | ICD-10-CM | POA: Diagnosis not present

## 2015-10-08 DIAGNOSIS — K760 Fatty (change of) liver, not elsewhere classified: Secondary | ICD-10-CM | POA: Diagnosis present

## 2015-10-08 DIAGNOSIS — F1020 Alcohol dependence, uncomplicated: Secondary | ICD-10-CM | POA: Diagnosis present

## 2015-10-08 DIAGNOSIS — E8770 Fluid overload, unspecified: Secondary | ICD-10-CM | POA: Diagnosis present

## 2015-10-08 DIAGNOSIS — N189 Chronic kidney disease, unspecified: Secondary | ICD-10-CM | POA: Diagnosis not present

## 2015-10-08 DIAGNOSIS — N289 Disorder of kidney and ureter, unspecified: Secondary | ICD-10-CM

## 2015-10-08 DIAGNOSIS — I129 Hypertensive chronic kidney disease with stage 1 through stage 4 chronic kidney disease, or unspecified chronic kidney disease: Secondary | ICD-10-CM | POA: Diagnosis present

## 2015-10-08 DIAGNOSIS — E8809 Other disorders of plasma-protein metabolism, not elsewhere classified: Secondary | ICD-10-CM | POA: Diagnosis present

## 2015-10-08 DIAGNOSIS — E876 Hypokalemia: Secondary | ICD-10-CM | POA: Diagnosis present

## 2015-10-08 DIAGNOSIS — E875 Hyperkalemia: Secondary | ICD-10-CM | POA: Diagnosis present

## 2015-10-08 DIAGNOSIS — F17200 Nicotine dependence, unspecified, uncomplicated: Secondary | ICD-10-CM

## 2015-10-08 DIAGNOSIS — G8220 Paraplegia, unspecified: Secondary | ICD-10-CM | POA: Diagnosis present

## 2015-10-08 DIAGNOSIS — L89152 Pressure ulcer of sacral region, stage 2: Secondary | ICD-10-CM | POA: Diagnosis present

## 2015-10-08 DIAGNOSIS — N183 Chronic kidney disease, stage 3 (moderate): Secondary | ICD-10-CM | POA: Diagnosis present

## 2015-10-08 DIAGNOSIS — K7682 Hepatic encephalopathy: Secondary | ICD-10-CM

## 2015-10-08 DIAGNOSIS — I10 Essential (primary) hypertension: Secondary | ICD-10-CM

## 2015-10-08 DIAGNOSIS — J410 Simple chronic bronchitis: Secondary | ICD-10-CM

## 2015-10-08 DIAGNOSIS — K5900 Constipation, unspecified: Secondary | ICD-10-CM | POA: Diagnosis present

## 2015-10-08 DIAGNOSIS — F1010 Alcohol abuse, uncomplicated: Secondary | ICD-10-CM

## 2015-10-08 DIAGNOSIS — K7290 Hepatic failure, unspecified without coma: Secondary | ICD-10-CM | POA: Diagnosis present

## 2015-10-08 DIAGNOSIS — A4902 Methicillin resistant Staphylococcus aureus infection, unspecified site: Secondary | ICD-10-CM

## 2015-10-08 DIAGNOSIS — D638 Anemia in other chronic diseases classified elsewhere: Secondary | ICD-10-CM | POA: Diagnosis present

## 2015-10-08 DIAGNOSIS — R531 Weakness: Secondary | ICD-10-CM | POA: Diagnosis present

## 2015-10-08 DIAGNOSIS — K7030 Alcoholic cirrhosis of liver without ascites: Secondary | ICD-10-CM

## 2015-10-08 DIAGNOSIS — R5381 Other malaise: Secondary | ICD-10-CM

## 2015-10-08 LAB — CBC WITH DIFFERENTIAL/PLATELET
BASOS ABS: 0.1 10*3/uL (ref 0.0–0.1)
BASOS PCT: 1 %
EOS ABS: 1.7 10*3/uL — AB (ref 0.0–0.7)
Eosinophils Relative: 13 %
HEMATOCRIT: 28.1 % — AB (ref 39.0–52.0)
HEMOGLOBIN: 9.7 g/dL — AB (ref 13.0–17.0)
Lymphocytes Relative: 12 %
Lymphs Abs: 1.5 10*3/uL (ref 0.7–4.0)
MCH: 33 pg (ref 26.0–34.0)
MCHC: 34.5 g/dL (ref 30.0–36.0)
MCV: 95.6 fL (ref 78.0–100.0)
MONO ABS: 1.7 10*3/uL — AB (ref 0.1–1.0)
MONOS PCT: 13 %
NEUTROS ABS: 8.1 10*3/uL — AB (ref 1.7–7.7)
Neutrophils Relative %: 61 %
Platelets: 134 10*3/uL — ABNORMAL LOW (ref 150–400)
RBC: 2.94 MIL/uL — ABNORMAL LOW (ref 4.22–5.81)
RDW: 20.4 % — AB (ref 11.5–15.5)
WBC: 13.1 10*3/uL — ABNORMAL HIGH (ref 4.0–10.5)

## 2015-10-08 LAB — COMPREHENSIVE METABOLIC PANEL
ALK PHOS: 89 U/L (ref 38–126)
ALT: 25 U/L (ref 17–63)
ANION GAP: 7 (ref 5–15)
AST: 57 U/L — AB (ref 15–41)
Albumin: 2.1 g/dL — ABNORMAL LOW (ref 3.5–5.0)
BILIRUBIN TOTAL: 1.5 mg/dL — AB (ref 0.3–1.2)
BUN: 67 mg/dL — ABNORMAL HIGH (ref 6–20)
CALCIUM: 7.8 mg/dL — AB (ref 8.9–10.3)
CO2: 24 mmol/L (ref 22–32)
CREATININE: 4.31 mg/dL — AB (ref 0.61–1.24)
Chloride: 102 mmol/L (ref 101–111)
GFR calc Af Amer: 15 mL/min — ABNORMAL LOW (ref >60)
GFR, EST NON AFRICAN AMERICAN: 13 mL/min — AB (ref >60)
Glucose, Bld: 107 mg/dL — ABNORMAL HIGH (ref 65–99)
POTASSIUM: 5.7 mmol/L — AB (ref 3.5–5.1)
Sodium: 133 mmol/L — ABNORMAL LOW (ref 135–145)
TOTAL PROTEIN: 6.3 g/dL — AB (ref 6.5–8.1)

## 2015-10-08 LAB — PROTIME-INR
INR: 2.96 — AB (ref 0.00–1.49)
PROTHROMBIN TIME: 30.3 s — AB (ref 11.6–15.2)

## 2015-10-08 LAB — ACETAMINOPHEN LEVEL

## 2015-10-08 MED ORDER — ENSURE ENLIVE PO LIQD
237.0000 mL | Freq: Two times a day (BID) | ORAL | Status: DC
  Administered 2015-10-09 – 2015-10-14 (×11): 237 mL via ORAL

## 2015-10-08 MED ORDER — ONDANSETRON HCL 4 MG PO TABS
4.0000 mg | ORAL_TABLET | Freq: Four times a day (QID) | ORAL | Status: DC | PRN
  Administered 2015-10-15: 4 mg via ORAL
  Filled 2015-10-08: qty 1

## 2015-10-08 MED ORDER — FENTANYL CITRATE (PF) 100 MCG/2ML IJ SOLN
25.0000 ug | Freq: Once | INTRAMUSCULAR | Status: AC
  Administered 2015-10-08: 25 ug via INTRAVENOUS
  Filled 2015-10-08: qty 2

## 2015-10-08 MED ORDER — SUCRALFATE 1 GM/10ML PO SUSP: 1.0000 g | Freq: Three times a day (TID) | ORAL | Status: DC

## 2015-10-08 MED ORDER — HEPARIN SOD (PORK) LOCK FLUSH 1 UNIT/ML IV SOLN: 3.0000 mL | Freq: Every day | INTRAVENOUS | Status: DC

## 2015-10-08 MED ORDER — SODIUM CHLORIDE 0.9 % IV SOLN
INTRAVENOUS | Status: DC
  Administered 2015-10-08: 23:00:00 via INTRAVENOUS

## 2015-10-08 MED ORDER — CITALOPRAM HYDROBROMIDE 20 MG PO TABS
20.0000 mg | ORAL_TABLET | Freq: Every day | ORAL | Status: DC
  Administered 2015-10-09 – 2015-10-15 (×7): 20 mg via ORAL
  Filled 2015-10-08 (×7): qty 1

## 2015-10-08 MED ORDER — OXYCODONE HCL 5 MG PO TABS
5.0000 mg | ORAL_TABLET | ORAL | Status: DC | PRN
  Administered 2015-10-09 (×2): 5 mg via ORAL
  Filled 2015-10-08 (×6): qty 1

## 2015-10-08 MED ORDER — PANTOPRAZOLE SODIUM 40 MG PO TBEC
40.0000 mg | DELAYED_RELEASE_TABLET | Freq: Two times a day (BID) | ORAL | Status: DC
  Administered 2015-10-08 – 2015-10-15 (×14): 40 mg via ORAL
  Filled 2015-10-08 (×14): qty 1

## 2015-10-08 MED ORDER — SODIUM POLYSTYRENE SULFONATE 15 GM/60ML PO SUSP
15.0000 g | Freq: Once | ORAL | Status: AC
  Administered 2015-10-08: 15 g via ORAL
  Filled 2015-10-08: qty 60

## 2015-10-08 MED ORDER — SUCRALFATE 1 GM/10ML PO SUSP
1.0000 g | Freq: Three times a day (TID) | ORAL | Status: DC
  Administered 2015-10-08 – 2015-10-10 (×7): 1 g via ORAL
  Filled 2015-10-08 (×4): qty 10

## 2015-10-08 MED ORDER — ONDANSETRON HCL 4 MG/2ML IJ SOLN
4.0000 mg | Freq: Four times a day (QID) | INTRAMUSCULAR | Status: DC | PRN
  Administered 2015-10-09: 4 mg via INTRAVENOUS
  Filled 2015-10-08: qty 2

## 2015-10-08 MED ORDER — ALBUTEROL SULFATE (2.5 MG/3ML) 0.083% IN NEBU
2.5000 mg | INHALATION_SOLUTION | Freq: Four times a day (QID) | RESPIRATORY_TRACT | Status: DC | PRN
  Administered 2015-10-09: 5 mg via RESPIRATORY_TRACT
  Filled 2015-10-08 (×2): qty 3

## 2015-10-08 NOTE — ED Notes (Signed)
Hosptialist at the bedside 

## 2015-10-08 NOTE — H&P (Signed)
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 Patient also was started on Coumadin for VT prophylaxis in previous admission. Today patient was brought to the hospital from skilled facility for abnormal labs as is BUN/creatinine now elevated to 67/4.31.  Patient only complains of abdominal distention and pain, he has incontinence of both bowel and bladder. Denies chest pain, no shortness of breath. No nausea vomiting or diarrhea. Patient has significant anasarca and still has significant swelling of lower extremities. As per patient the PICC line site started bleeding yesterday, chest x-ray shows PICC line in the lower SVC which is unchanged. Patient currently on vancomycin, last dose to be completed on April 4 as per the discharge note from internal medicine on 09/13/2015.   Allergies:  No Known Allergies    Past Medical History  Diagnosis Date  . COPD (chronic obstructive pulmonary disease) (HCC)   . Hypertension   . Hepatic steatosis 12/14/2011  . Compression fracture of L1 lumbar vertebra (HCC) 12/14/2011  . C2 cervical fracture (HCC)   . Duodenal ulcer with hemorrhage 12/15/2011    Likely source of bleeding.per EGD; Dr. Karilyn Cota  . Esophageal varices without bleeding (HCC) 12/15/2011    per EGD  . Cirrhosis (HCC) 12/16/2011    per ultrasound  . Hx of substance abuse 12/17/2011  . 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 Hepatic encephalopathy (HCC)   . Helicobacter pylori antibody positive 12/19/2011  . Encephalopathy 05/18/2012  . UTI (urinary tract infection) 12/13/2011  . Ascites 05/18/2012    S/p paracentesis yielding 7880 mL  . Cirrhosis (HCC)   . Depression     Past Surgical History  Procedure Laterality Date  . Coronary angioplasty with stent placement     . Thoracic laminectomy for epidural abscess N/A 09/07/2015    Procedure: THORACIC FIVE-THORACIC ELEVEN LAMINECTOMY FOR EPIDURAL ABSCESS;  Surgeon: Loura Halt Ditty, MD;  Location: [facility];  Service: Neurosurgery;  Laterality: N/A;    Prior to Admission medications   Medication Sig Start Date End Date Taking? Authorizing Provider  albuterol (PROVENTIL) (2.5 MG/3ML) 0.083% nebulizer solution Take 3 mLs (2.5 mg total) by nebulization every 6 (six) hours as needed for wheezing or shortness of breath. 06/10/15  Yes Oneta Rack, NP  citalopram (CELEXA) 20 MG tablet Take 1 tablet (20 mg total) by mouth daily. 09/30/15  Yes Oval Linsey, MD  furosemide (LASIX) 20 MG tablet Take 2 tablets (40 mg total) by mouth daily. Patient taking differently: Take 20 mg by mouth 2 (two) times daily.  09/30/15  Yes Oval Linsey, MD  Heparin Lock Flush (HEPARIN NICU/SCN FLUSH) 1 UNIT/ML SOLN Inject 3 mLs into the vein daily.   Yes Historical Provider, MD  magnesium oxide (MAG-OX) 400 (241.3 Mg) MG tablet Take 1 tablet (400 mg total) by mouth 2 (two) times daily. 09/30/15  Yes Oval Linsey, MD  omeprazole (PRILOSEC) 20 MG capsule Take 20 mg by mouth daily.   Yes Historical Provider, MD  oxyCODONE-acetaminophen (PERCOCET) 7.5-325 MG tablet Take 1 tablet by mouth every 6 (six) hours as needed for moderate pain or severe pain.   Yes Historical Provider, MD  potassium chloride SA (K-DUR,KLOR-CON) 20 MEQ tablet Take 2 tablets (40 mEq total) by mouth daily. 09/30/15  Yes Oval Linsey, MD  spironolactone (ALDACTONE) 25 MG  tablet Take 1 tablet (25 mg total) by mouth 2 (two) times daily. 09/30/15  Yes Oval Linsey, MD  sucralfate (CARAFATE) 1 GM/10ML suspension Take 10 mLs (1 g total) by mouth 4 (four) times daily -  with meals and at bedtime. 09/30/15  Yes Oval Linsey, MD  carisoprodol (SOMA) 350 MG tablet Take 1 tablet (350 mg total) by mouth 3 (three) times daily as needed for muscle spasms. Patient not  taking: Reported on 10/08/2015 09/13/15   Hollice Espy, MD  feeding supplement, ENSURE ENLIVE, (ENSURE ENLIVE) LIQD Take 237 mLs by mouth 2 (two) times daily between meals. Patient not taking: Reported on 10/08/2015 09/30/15   Oval Linsey, MD  HYDROcodone-acetaminophen (NORCO/VICODIN) 5-325 MG tablet Take 1 tablet by mouth every 6 (six) hours as needed for severe pain. Patient not taking: Reported on 10/08/2015 09/13/15   Hollice Espy, MD  pantoprazole (PROTONIX) 40 MG tablet Take 1 tablet (40 mg total) by mouth 2 (two) times daily. Patient not taking: Reported on 10/08/2015 09/30/15   Oval Linsey, MD  vancomycin 1,500 mg in sodium chloride 0.9 % 500 mL Inject 1,500 mg into the vein every other day. Patient not taking: Reported on 10/08/2015 09/30/15   Oval Linsey, MD  warfarin (COUMADIN) 2 MG tablet Take 1 tablet (2 mg total) by mouth once. Patient not taking: Reported on 10/08/2015 09/30/15   Oval Linsey, MD    Social History:  reports that he has been smoking Cigarettes.  He has been smoking about 1.00 pack per day. He uses smokeless tobacco. He reports that he drinks alcohol. He reports that he uses illicit drugs (Cocaine).    Filed Weights   10/08/15 1714  Weight: 125.193 kg (276 lb)    All the positives are listed in BOLD  Review of Systems:  HEENT: Headache, blurred vision, runny nose, sore throat Neck: Hypothyroidism, hyperthyroidism,,lymphadenopathy Chest : Shortness of breath, history of COPD, Asthma Heart : Chest pain, history of coronary arterey disease GI:  Nausea, vomiting, diarrhea, constipation, GERD, abdominal pain GU: Dysuria, urgency, frequency of urination, hematuria, incontinence of bladder Neuro: Stroke, seizures, syncope Psych: Depression, anxiety, hallucinations   Physical Exam: Blood pressure 119/71, pulse 78, temperature 98.1 F (36.7 C), temperature source Oral, resp. rate 14, height  (1.854 m), weight 125.193 kg (276 lb), SpO2 100  %. Constitutional:   Patient is a well-developed and well-nourished male in no acute distress and cooperative with exam. Head: Normocephalic and atraumatic Mouth: Mucus membranes moist Eyes: PERRL, EOMI, conjunctivae normal Neck: Supple, No Thyromegaly Cardiovascular: RRR, S1 normal, S2 normal Pulmonary/Chest: CTAB, no wheezes, rales, or rhonchi Abdominal: Soft. Non-tender, distended, tender to palpation, bowel sounds are normal, no masses, organomegaly, or guarding present.  Neurological: A&O x3, paraplegia Extremities : No Cyanosis, Clubbing. Bilateral 2+ pitting edema of the lower extremities  Labs on Admission:  Basic Metabolic Panel:  Recent Labs Lab 10/08/15 1915  NA 133*  K 5.7*  CL 102  CO2 24  GLUCOSE 107*  BUN 67*  CREATININE 4.31*  CALCIUM 7.8*   Liver Function Tests:  Recent Labs Lab 10/08/15 1915  AST 57*  ALT 25  ALKPHOS 89  BILITOT 1.5*  PROT 6.3*  ALBUMIN 2.1*   No results for input(s): LIPASE, AMYLASE in the last 168 hours. No results for input(s): AMMONIA in the last 168 hours. CBC:  Recent Labs Lab 10/08/15 1915  WBC 13.1*  NEUTROABS 8.1*  HGB 9.7*  HCT 28.1*  MCV 95.6  PLT 134*  Cardiac Enzymes: No results for input(s): CKTOTAL, CKMB, CKMBINDEX, TROPONINI in the last 168 hours.  BNP (last 3 results)  Recent Labs  09/12/15 1345  BNP 57.1      Radiological Exams on Admission: Dg Chest 2 View  10/08/2015  CLINICAL DATA:  Weakness and short of breath EXAM: CHEST  2 VIEW COMPARISON:  09/26/2015 FINDINGS: Right arm PICC tip in the lower SVC unchanged Mild bibasilar atelectasis with some interval improvement. Negative for heart failure. No edema or effusion. Coronary stent. Skin staples in the posterior back from prior laminectomy. Severe compression fracture of T8 which has progressed since the prior MRI of 09/07/2015. IMPRESSION: Improvement in bibasilar atelectasis. No superimposed acute cardiothoracic abnormality. Progression of  severe compression fracture T8 Electronically Signed   By: Marlan Palauharles  Clark M.D.   On: 10/08/2015 18:55    EKG: Independently reviewed. Sinus rhythm   Assessment/Plan Active Problems:   Epidural abscess   DNR (do not resuscitate) discussion   Renal failure (ARF), acute on chronic (HCC)   AKI (acute kidney injury) (HCC)   Hyperkalemia   Acute kidney injury on chronic kidney disease Patient's baseline creatinine 1.75 as of 09/29/2015. Today presenting with creatinine of 4.31, he has significant anasarca albumin of 2.1. Will avoid giving IV fluids at this time. Will consult nephrology in a.m. for further recommendations  Epidural abscess, MRSA bacteremia Continue vancomycin, last dose on 11/02/2015 as per note from 09/13/2015 Patient to follow-up infectious diseases outpatient Continue vancomycin per pharmacy.  Hyperkalemia Will give 1 dose of Kayexalate 15 g by mouth 1 Follow BMP in a.m.  Abdominal pain Patient was diagnosed with ileus in previous admission, again has abdominal distention Will obtain abdominal x-ray, continue oxycodone when necessary for pain.  DVT prophylaxis Patient was started on Coumadin in previous admission, will consult pharmacy for dosing of Coumadin  Code status: DO NOT RESUSCITATE  Family discussion: No family at bedside   Time Spent on Admission: 60 minutes  Juancarlos Crescenzo S [facility] Pager: [phone] 10/08/2015, 9:23 PM  If 7PM-7AM, please contact night-coverage  [email or web address]  Password [email or web address]

## 2015-10-08 NOTE — ED Notes (Signed)
Patient at [facility] in [place]. Sent for Creatinine and BUN elevation. Patient has been on Vancomycin through PICC line for approximately 1 week. Alert/oriented. Has been having Chest pain x 4 days from fall. EKG, labs done at [facility].

## 2015-10-08 NOTE — ED Notes (Signed)
Pulled labs from [facility] for blood results, wasted 

## 2015-10-08 NOTE — ED Notes (Signed)
Son - Thayer OhmChris calling to get updatem talking to father on phone now, pt call son with plan  [phone]

## 2015-10-08 NOTE — ED Provider Notes (Signed)
CSN: [id number]     Arrival date & time 10/08/15  1709 History   First MD Initiated Contact with Patient 10/08/15 1727     Chief Complaint  Patient presents with  . Abnormal Lab     HPI Patient was sent in from the nursing home with worsening renal function. Reportedly creatinine is up to 4. Has had recent thoracic epidural abscess and is on IV vancomycin. Also on Coumadin. Has had renal insufficiency to his cirrhosis. Patient states he does feel weak. He has paralysis of his lower extremities due to the infection. Denies fevers or chills. He is on Coumadin   Past Medical History  Diagnosis Date  . COPD (chronic obstructive pulmonary disease) (HCC)   . Hypertension   . Hepatic steatosis 12/14/2011  . Compression fracture of L1 lumbar vertebra (HCC) 12/14/2011  . C2 cervical fracture (HCC)   . Duodenal ulcer with hemorrhage 12/15/2011    Likely source of bleeding.per EGD; Dr. Karilyn Cotaehman  . Esophageal varices without bleeding (HCC) 12/15/2011    per EGD  . Cirrhosis (HCC) 12/16/2011    per ultrasound  . Hx of substance abuse 12/17/2011  . 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 Hepatic encephalopathy (HCC)   . Helicobacter pylori antibody positive 12/19/2011  . Encephalopathy 05/18/2012  . UTI (urinary tract infection) 12/13/2011  . Ascites 05/18/2012    S/p paracentesis yielding 7880 mL  . Cirrhosis (HCC)   . Depression    Past Surgical History  Procedure Laterality Date  . Coronary angioplasty with stent placement    . Thoracic laminectomy for epidural abscess N/A 09/07/2015    Procedure: THORACIC FIVE-THORACIC ELEVEN LAMINECTOMY FOR EPIDURAL ABSCESS;  Surgeon: Loura HaltBenjamin Jared Ditty, MD;  Location: [facility];  Service: Neurosurgery;  Laterality: N/A;   No family history on file. Social History  Substance Use Topics  . Smoking status: Current Every Day Smoker -- 1.00 packs/day    Types: Cigarettes  . Smokeless tobacco: Current User   Comment: 1 1/2 pack a day  . Alcohol Use: Yes     Comment: drinking everyday.    Review of Systems  Constitutional: Positive for fatigue. Negative for fever and appetite change.  Respiratory: Negative for shortness of breath.   Cardiovascular: Positive for leg swelling.  Genitourinary: Negative for flank pain.  Musculoskeletal: Positive for back pain.  Skin: Positive for rash.  Neurological: Positive for weakness.      Allergies  Review of patient's allergies indicates no known allergies.  Home Medications   Prior to Admission medications   Medication Sig Start Date End Date Taking? Authorizing Provider  albuterol (PROVENTIL) (2.5 MG/3ML) 0.083% nebulizer solution Take 3 mLs (2.5 mg total) by nebulization every 6 (six) hours as needed for wheezing or shortness of breath. 06/10/15  Yes Oneta Rackanika N Lewis, NP  citalopram (CELEXA) 20 MG tablet Take 1 tablet (20 mg total) by mouth daily. 09/30/15  Yes Oval Linseyichard Dondiego, MD  furosemide (LASIX) 20 MG tablet Take 2 tablets (40 mg total) by mouth daily. Patient taking differently: Take 20 mg by mouth 2 (two) times daily.  09/30/15  Yes Oval Linseyichard Dondiego, MD  Heparin Lock Flush (HEPARIN NICU/SCN FLUSH) 1 UNIT/ML SOLN Inject 3 mLs into the vein daily.   Yes Historical Provider, MD  magnesium oxide (MAG-OX) 400 (241.3 Mg) MG tablet Take 1 tablet (400 mg total) by mouth 2 (two) times daily. 09/30/15  Yes Oval Linseyichard Dondiego, MD  omeprazole (PRILOSEC) 20 MG capsule Take 20 mg by  mouth daily.   Yes Historical Provider, MD  oxyCODONE-acetaminophen (PERCOCET) 7.5-325 MG tablet Take 1 tablet by mouth every 6 (six) hours as needed for moderate pain or severe pain.   Yes Historical Provider, MD  potassium chloride SA (K-DUR,KLOR-CON) 20 MEQ tablet Take 2 tablets (40 mEq total) by mouth daily. 09/30/15  Yes Oval Linsey, MD  spironolactone (ALDACTONE) 25 MG tablet Take 1 tablet (25 mg total) by mouth 2 (two) times daily. 09/30/15  Yes Oval Linsey, MD  sucralfate  (CARAFATE) 1 GM/10ML suspension Take 10 mLs (1 g total) by mouth 4 (four) times daily -  with meals and at bedtime. 09/30/15  Yes Oval Linsey, MD  carisoprodol (SOMA) 350 MG tablet Take 1 tablet (350 mg total) by mouth 3 (three) times daily as needed for muscle spasms. Patient not taking: Reported on 10/08/2015 09/13/15   Hollice Espy, MD  feeding supplement, ENSURE ENLIVE, (ENSURE ENLIVE) LIQD Take 237 mLs by mouth 2 (two) times daily between meals. Patient not taking: Reported on 10/08/2015 09/30/15   Oval Linsey, MD  HYDROcodone-acetaminophen (NORCO/VICODIN) 5-325 MG tablet Take 1 tablet by mouth every 6 (six) hours as needed for severe pain. Patient not taking: Reported on 10/08/2015 09/13/15   Hollice Espy, MD  pantoprazole (PROTONIX) 40 MG tablet Take 1 tablet (40 mg total) by mouth 2 (two) times daily. Patient not taking: Reported on 10/08/2015 09/30/15   Oval Linsey, MD  vancomycin 1,500 mg in sodium chloride 0.9 % 500 mL Inject 1,500 mg into the vein every other day. Patient not taking: Reported on 10/08/2015 09/30/15   Oval Linsey, MD  warfarin (COUMADIN) 2 MG tablet Take 1 tablet (2 mg total) by mouth once. Patient not taking: Reported on 10/08/2015 09/30/15   Oval Linsey, MD   BP 119/71 mmHg  Pulse 78  Temp(Src) 98.1 F (36.7 C) (Oral)  Resp 14  Ht  (1.854 m)  Wt 276 lb (125.193 kg)  BMI 36.42 kg/m2  SpO2 100% Physical Exam  Constitutional: He appears well-developed.  HENT:  Head: Atraumatic.  Neck: Neck supple.  Pulmonary/Chest: He has rales.  Diffuse harsh breath sounds.  Musculoskeletal: He exhibits edema.  PIC line to right upper arm with blood around insertion site. Edema to bilateral lower extremities and up to  abdomen.  Neurological: He is alert.  Skin: Skin is warm. Rash noted.  Somewhat diffuse rash with small reddened areas.    ED Course  Procedures (including critical care time) Labs Review Labs Reviewed  COMPREHENSIVE METABOLIC  PANEL - Abnormal; Notable for the following:    Sodium 133 (*)    Potassium 5.7 (*)    Glucose, Bld 107 (*)    BUN 67 (*)    Creatinine, Ser 4.31 (*)    Calcium 7.8 (*)    Total Protein 6.3 (*)    Albumin 2.1 (*)    AST 57 (*)    Total Bilirubin 1.5 (*)    GFR calc non Af Amer 13 (*)    GFR calc Af Amer 15 (*)    All other components within normal limits  CBC WITH DIFFERENTIAL/PLATELET - Abnormal; Notable for the following:    WBC 13.1 (*)    RBC 2.94 (*)    Hemoglobin 9.7 (*)    HCT 28.1 (*)    RDW 20.4 (*)    Platelets 134 (*)    Neutro Abs 8.1 (*)    Monocytes Absolute 1.7 (*)    Eosinophils Absolute 1.7 (*)  All other components within normal limits  PROTIME-INR - Abnormal; Notable for the following:    Prothrombin Time 30.3 (*)    INR 2.96 (*)    All other components within normal limits  VANCOMYCIN, RANDOM  URINALYSIS, ROUTINE W REFLEX MICROSCOPIC (NOT AT [facility])  ACETAMINOPHEN LEVEL    Imaging Review Dg Chest 2 View  10/08/2015  CLINICAL DATA:  Weakness and short of breath EXAM: CHEST  2 VIEW COMPARISON:  09/26/2015 FINDINGS: Right arm PICC tip in the lower SVC unchanged Mild bibasilar atelectasis with some interval improvement. Negative for heart failure. No edema or effusion. Coronary stent. Skin staples in the posterior back from prior laminectomy. Severe compression fracture of T8 which has progressed since the prior MRI of 09/07/2015. IMPRESSION: Improvement in bibasilar atelectasis. No superimposed acute cardiothoracic abnormality. Progression of severe compression fracture T8 Electronically Signed   By: Marlan Palau M.D.   On: 10/08/2015 18:55   I have personally reviewed and evaluated these images and lab results as part of my medical decision-making.   EKG Interpretation   Date/Time:  Friday October 08 2015 21:17:21 EST Ventricular Rate:  83 PR Interval:  175 QRS Duration: 84 QT Interval:  382 QTC Calculation: 449 R Axis:   54 Text Interpretation:   Sinus rhythm Low voltage, extremity and precordial  leads Probable anteroseptal infarct, old Confirmed by Livi Mcgann  MD,  Terryl Niziolek [phone]) on 10/08/2015 9:22:51 PM      MDM   Final diagnoses:  Renal failure (ARF), acute on chronic (HCC)  Alcoholic cirrhosis of liver with ascites (HCC)  MRSA infection    Patient was sent in with renal failure from the nursing home. Found a creatinine of 4 up from a baseline of around 2. Likely multifactorial. Will admit to internal medicine.    Benjiman Core, MD 10/08/15 2123

## 2015-10-08 NOTE — ED Notes (Signed)
Lab called they will have to send vanco lab out

## 2015-10-08 NOTE — Telephone Encounter (Signed)
i was called by SNF to help with vancomycin dosing since the patient is having worsening renal failure. Roberto [name] was recently admitted to SNF for QOD dosing of vancomycin. He has received 2 doses but his creatinine on discharge from [facility] was 1.75 (per EPIC) and the SNF reports that his Cr 4.03 on March 8th, vanco level of 19.9 (in therapeutic range). I have recommended to stop vancomycin but more importantly is that the patient should be readmitted to the hospital for evaluation of his acute on chronic kidney disease. The patient has known history of cirrhosis and concern that vancomycin may have worsened kidney function vs. Having element of hepatorenal syndrome. My recommendations that i conveyed to his nurse is that [facility]. 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 He will need nephro sparing abtx such as daptomycin (dosed at 8mg /kg) QOD.  Please call if further questions

## 2015-10-09 LAB — CBC
HEMATOCRIT: 26.1 % — AB (ref 39.0–52.0)
HEMOGLOBIN: 8.9 g/dL — AB (ref 13.0–17.0)
MCH: 32.8 pg (ref 26.0–34.0)
MCHC: 34.1 g/dL (ref 30.0–36.0)
MCV: 96.3 fL (ref 78.0–100.0)
Platelets: 87 10*3/uL — ABNORMAL LOW (ref 150–400)
RBC: 2.71 MIL/uL — ABNORMAL LOW (ref 4.22–5.81)
RDW: 20.6 % — AB (ref 11.5–15.5)
WBC: 12.4 10*3/uL — ABNORMAL HIGH (ref 4.0–10.5)

## 2015-10-09 LAB — PROTIME-INR
INR: 2.56 — ABNORMAL HIGH (ref 0.00–1.49)
PROTHROMBIN TIME: 27.2 s — AB (ref 11.6–15.2)

## 2015-10-09 LAB — URINALYSIS, ROUTINE W REFLEX MICROSCOPIC
BILIRUBIN URINE: NEGATIVE
Glucose, UA: NEGATIVE mg/dL
KETONES UR: NEGATIVE mg/dL
Nitrite: NEGATIVE
Protein, ur: NEGATIVE mg/dL
pH: 7 (ref 5.0–8.0)

## 2015-10-09 LAB — COMPREHENSIVE METABOLIC PANEL
ALK PHOS: 87 U/L (ref 38–126)
ALT: 25 U/L (ref 17–63)
ANION GAP: 8 (ref 5–15)
AST: 53 U/L — ABNORMAL HIGH (ref 15–41)
Albumin: 2.1 g/dL — ABNORMAL LOW (ref 3.5–5.0)
BILIRUBIN TOTAL: 2 mg/dL — AB (ref 0.3–1.2)
BUN: 68 mg/dL — ABNORMAL HIGH (ref 6–20)
CO2: 25 mmol/L (ref 22–32)
Calcium: 8 mg/dL — ABNORMAL LOW (ref 8.9–10.3)
Chloride: 102 mmol/L (ref 101–111)
Creatinine, Ser: 4.2 mg/dL — ABNORMAL HIGH (ref 0.61–1.24)
GFR, EST AFRICAN AMERICAN: 16 mL/min — AB (ref >60)
GFR, EST NON AFRICAN AMERICAN: 13 mL/min — AB (ref >60)
GLUCOSE: 93 mg/dL (ref 65–99)
Potassium: 5.4 mmol/L — ABNORMAL HIGH (ref 3.5–5.1)
Sodium: 135 mmol/L (ref 135–145)
TOTAL PROTEIN: 6.1 g/dL — AB (ref 6.5–8.1)

## 2015-10-09 LAB — GLUCOSE, CAPILLARY: GLUCOSE-CAPILLARY: 117 mg/dL — AB (ref 65–99)

## 2015-10-09 LAB — URINE MICROSCOPIC-ADD ON

## 2015-10-09 LAB — MRSA PCR SCREENING: MRSA BY PCR: NEGATIVE

## 2015-10-09 LAB — VANCOMYCIN, RANDOM
VANCOMYCIN RM: 25 ug/mL
Vancomycin Rm: 30 ug/mL

## 2015-10-09 MED ORDER — FUROSEMIDE 10 MG/ML IJ SOLN
160.0000 mg | Freq: Two times a day (BID) | INTRAVENOUS | Status: DC
  Administered 2015-10-09 – 2015-10-11 (×5): 160 mg via INTRAVENOUS
  Filled 2015-10-09 (×7): qty 16

## 2015-10-09 MED ORDER — WARFARIN SODIUM 2 MG PO TABS
2.0000 mg | ORAL_TABLET | Freq: Once | ORAL | Status: AC
  Administered 2015-10-09: 2 mg via ORAL
  Filled 2015-10-09: qty 1

## 2015-10-09 MED ORDER — IPRATROPIUM-ALBUTEROL 0.5-2.5 (3) MG/3ML IN SOLN
3.0000 mL | Freq: Four times a day (QID) | RESPIRATORY_TRACT | Status: DC
  Administered 2015-10-10 – 2015-10-15 (×18): 3 mL via RESPIRATORY_TRACT
  Filled 2015-10-09 (×18): qty 3

## 2015-10-09 MED ORDER — WARFARIN - PHARMACIST DOSING INPATIENT
Freq: Every day | Status: DC
  Administered 2015-10-10: 18:00:00

## 2015-10-09 MED ORDER — LORAZEPAM 0.5 MG PO TABS
0.5000 mg | ORAL_TABLET | Freq: Four times a day (QID) | ORAL | Status: DC | PRN
  Administered 2015-10-09 – 2015-10-10 (×3): 0.5 mg via ORAL
  Filled 2015-10-09 (×3): qty 1

## 2015-10-09 MED ORDER — CITALOPRAM HYDROBROMIDE 20 MG PO TABS: 20.0000 mg | ORAL_TABLET | Freq: Every day | ORAL | Status: DC

## 2015-10-09 MED ORDER — LORAZEPAM 2 MG/ML IJ SOLN
1.0000 mg | Freq: Once | INTRAMUSCULAR | Status: AC
  Administered 2015-10-09: 1 mg via INTRAVENOUS
  Filled 2015-10-09: qty 1

## 2015-10-09 MED ORDER — NALOXEGOL OXALATE 25 MG PO TABS
25.0000 mg | ORAL_TABLET | Freq: Every day | ORAL | Status: DC
  Administered 2015-10-09 – 2015-10-12 (×4): 25 mg via ORAL
  Filled 2015-10-09 (×5): qty 1

## 2015-10-09 MED ORDER — OXYCODONE HCL 5 MG PO TABS
5.0000 mg | ORAL_TABLET | ORAL | Status: DC | PRN
  Administered 2015-10-09 – 2015-10-11 (×5): 5 mg via ORAL
  Filled 2015-10-09 (×2): qty 1

## 2015-10-09 MED ORDER — ALBUMIN HUMAN 25 % IV SOLN
25.0000 g | Freq: Two times a day (BID) | INTRAVENOUS | Status: DC
Start: 2015-10-09 — End: 2015-10-12
  Administered 2015-10-09 – 2015-10-11 (×6): 25 g via INTRAVENOUS
  Filled 2015-10-09 (×7): qty 100

## 2015-10-09 NOTE — Progress Notes (Signed)
1150 Patient noted moaning and during patient assessment patient's RIGHT UE PICC line noted lying in the floor next to the patient's bed. Patient stated "I must have accidentally pulled it out." No bleeding noted, area dry with small scab noted where PICC line was inserted. MD on call notified. Will attempt peripheral IV access.

## 2015-10-09 NOTE — Progress Notes (Signed)
2112 Patient noted banging on his bed yelling out "Let me out of here! I want to go home! They won't do nothing for me! I can't breath!" Patient continues to pull of nasal cannula O2 2L, O2 SAT 95% RA. Congestion noted, lung sounds diminished/rhonchi bilateral. HOB 45 degrees at this time. RT notified regarding patient request for PRN breathing Tx. Patient's son Elnoria HowardCraig Coppens aware.

## 2015-10-09 NOTE — Progress Notes (Signed)
2006 Patient c/o severe ABD pain stating "I've been hurting all day and no one will help me!" Patient is crying, moaning and anxious at this time. Dr.Fanta (on call) notified, made aware, no new orders given.

## 2015-10-09 NOTE — Progress Notes (Signed)
Pharmacy Antibiotic Note  [name] is a 70 y.o. male admitted on 10/08/2015 with MRSA bacteremia with a spinal abscess. Planned vancomycin through 11/02/2015. Pharmacy has been consulted for Vancomycin dosing. Patient has AKI on top of chronic renal insufficiency.  Vancomycin random level  3/10 at 1900 was 8230mcg/ml. Another random level this AM shows Vanc tr 3525mcg/ml. Estimated pharmacokinetic parameters are Ke=0.012, and t 1/2= 57 hours. Will recheck Vancomycin random level in 24 hours. Will redose when levels <6220mcg/ml  Goal of therapy: Vanc trough 15-20 mcg/ml Plan: Check Vancomycin trough when appropriate F/u renal function and clinical course  Height: 6\' 1"  (185.4 cm) Weight: 113 lb 8 oz (51.483 kg) IBW/kg (Calculated) : 79.9  Temp (24hrs), Avg:98.2 F (36.8 C), Min:98.1 F (36.7 C), Max:98.4 F (36.9 C)   Recent Labs Lab 10/08/15 1915 10/09/15 0609  WBC 13.1* 12.4*  CREATININE 4.31* 4.20*  VANCORANDOM 30  --     Estimated Creatinine Clearance: 12.4 mL/min (by C-G formula based on Cr of 4.2).    No Known Allergies  Antimicrobials this admission: vanc 1/29 >> 2/2, 2/8 >> Zosyn 1/29 >> 2/1 ceftaroline 2/2>> 2/8  Dose adjustments this admission: 2/1 VT 26 on 1 gm q12, dose changed to 1250 q24 2/13 VT 23 on 750 mg Q12H SCr 1.43 2/17 VT 21 on 1250 mg every 24 hours SCR 1.87 2/20 VT 18 on 1gm q24hrs Scr 2.31  2/23 VT 26 on 1 gm q24, dose changed to 1500 q48h scr 2.34   Thank you for allowing pharmacy to be a part of this patient's care. Elder CyphersLorie Romolo Sieling, BS Loura Backharm D, [place] Clinical Pharmacist Pager [phone] 10/09/2015 8:37 AM

## 2015-10-09 NOTE — Progress Notes (Signed)
Patient extremely emotional he was told he needed a " procedure" I talked to him and told him we're monitoring his renal function and electrolytes and anticoagulation his chronic diffuse abdominal pain anasarca cirrhosis hypoproteinemia will address his anxiety and depression further. We'll add movantik 12.5 mg. to see if this help with constipation Roberto JunesRonald J [name] JXB:[id number] DOB: [birth year] DOA: 10/08/2015 PCP: Roberto StallingNDIEGO,Roberto Finelli M, MD             Physical Exam: Blood pressure 113/84, pulse 92, temperature 98.4 F (36.9 C), temperature source Oral, resp. rate 20, height 6\' 1"  (1.854 [name]), weight 113 lb 8 oz (51.483 kg), SpO2 95 %. lungs diminished breath sounds in the bases no rales wheezes or rhonchi appreciable heart regular rhythm no*70 year old abdomen soft distended bowel sounds normoactive no peristaltic progression. No guarding or rebound   Investigations:  Recent Results (from the past 240 hour(s))  MRSA PCR Screening     Status: None   Collection Time: 10/09/15  1:00 AM  Result Value Ref Range Status   MRSA by PCR NEGATIVE NEGATIVE Final    Comment:        The GeneXpert MRSA Assay (FDA approved for NASAL specimens only), is one component of a comprehensive MRSA colonization surveillance program. It is not intended to diagnose MRSA infection nor to guide or monitor treatment for MRSA infections.      Basic Metabolic Panel:  Recent Labs  13/02/6502/10/17 1915 10/09/15 0609  NA 133* 135  K 5.7* 5.4*  CL 102 102  CO2 24 25  GLUCOSE 107* 93  BUN 67* 68*  CREATININE 4.31* 4.20*  CALCIUM 7.8* 8.0*   Liver Function Tests:  Recent Labs  10/08/15 1915 10/09/15 0609  AST 57* 53*  ALT 25 25  ALKPHOS 89 87  BILITOT 1.5* 2.0*  PROT 6.3* 6.1*  ALBUMIN 2.1* 2.1*     CBC:  Recent Labs  10/08/15 1915 10/09/15 0609  WBC 13.1* 12.4*  NEUTROABS 8.1*  --   HGB 9.7* 8.9*  HCT 28.1* 26.1*  MCV 95.6 96.3  PLT 134* 87*    Dg Chest 2 View  10/08/2015  CLINICAL  DATA:  Weakness and short of breath EXAM: CHEST  2 VIEW COMPARISON:  09/26/2015 FINDINGS: Right arm PICC tip in the lower SVC unchanged Mild bibasilar atelectasis with some interval improvement. Negative for heart failure. No edema or effusion. Coronary stent. Skin staples in the posterior back from prior laminectomy. Severe compression fracture of T8 which has progressed since the prior MRI of 09/07/2015. IMPRESSION: Improvement in bibasilar atelectasis. No superimposed acute cardiothoracic abnormality. Progression of severe compression fracture T8 Electronically Signed   By: Marlan Palauharles  Clark [name].D.   On: 10/08/2015 18:55   Dg Abd 2 Views  10/08/2015  CLINICAL DATA:  Abdominal pain and distension. EXAM: ABDOMEN - 2 VIEW COMPARISON:  Chest radiograph earlier this day. Abdominal radiograph 09/26/2015 FINDINGS: Diffuse gaseous distention of large and small bowel. Air under the right hemidiaphragm on supine view is felt to be within colon, no free air seen on chest radiographs earlier this day. Midline staples over the lower chest and upper abdomen. Pelvic phleboliths are seen. Detailed evaluation limited by soft tissue attenuation from body habitus. IMPRESSION: Diffuse gaseous distention of large and small bowel. Right upper quadrant air is felt to be within colon, no free air was seen on chest radiographs earlier this day. Electronically Signed   By: Rubye OaksMelanie  Ehinger [name].D.   On: 10/08/2015 23:08      Medications:  Impression: Compression fracture progression of T8 Active Problems:   Epidural abscess   DNR (do not resuscitate) discussion   Renal failure (ARF), acute on chronic (HCC)   AKI (acute kidney injury) (HCC)     Plan: We'll add Flovent 12.5 mg by mouth daily to see if this helps with abdominal distention distention and constipation requested GI consult appreciated nephrology consult will address him with antidepressants and anti-anxiety medications   Consultants: Nephrology  gastroenterology   Procedures   Antibiotics: Vancomycin (consult                  Code Status: No code   Family Communication:  Long discussion with family members today  Disposition Plan   Time spent: 40 minutes   LOS: 1 day   Roberto [name]   10/09/2015, 11:08 AM

## 2015-10-09 NOTE — Progress Notes (Signed)
ANTICOAGULATION CONSULT NOTE - consule  Pharmacy Consult for Coumadin Indication: VTE prophylaxis  No Known Allergies  Patient Measurements: Height: 6\' 1"  (185.4 cm) Weight: 113 lb 8 oz (51.483 kg) IBW/kg (Calculated) : 79.9  Vital Signs: Temp: 98.4 F (36.9 C) (03/11 0626) Temp Source: Oral (03/11 0626) BP: 113/84 mmHg (03/11 0626) Pulse Rate: 92 (03/11 0626)  Labs:  Recent Labs  10/08/15 1915 10/09/15 0609 10/09/15 0958  HGB 9.7* 8.9*  --   HCT 28.1* 26.1*  --   PLT 134* 87*  --   LABPROT 30.3*  --  27.2*  INR 2.96*  --  2.56*  CREATININE 4.31* 4.20*  --    Medical History: Past Medical History  Diagnosis Date  . COPD (chronic obstructive pulmonary disease) (HCC)   . Hypertension   . Hepatic steatosis 12/14/2011  . Compression fracture of L1 lumbar vertebra (HCC) 12/14/2011  . C2 cervical fracture (HCC)   . Duodenal ulcer with hemorrhage 12/15/2011    Likely source of bleeding.per EGD; Dr. Karilyn Cotaehman  . Esophageal varices without bleeding (HCC) 12/15/2011    per EGD  . Cirrhosis (HCC) 12/16/2011    per ultrasound  . Hx of substance abuse 12/17/2011  . 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 Hepatic encephalopathy (HCC)   . Helicobacter pylori antibody positive 12/19/2011  . Encephalopathy 05/18/2012  . UTI (urinary tract infection) 12/13/2011  . Ascites 05/18/2012    S/p paracentesis yielding 7880 mL  . Cirrhosis (HCC)   . Depression    Medications:  Prescriptions prior to admission  Medication Sig Dispense Refill Last Dose  . albuterol (PROVENTIL) (2.5 MG/3ML) 0.083% nebulizer solution Take 3 mLs (2.5 mg total) by nebulization every 6 (six) hours as needed for wheezing or shortness of breath. 75 mL 12 unknown  . citalopram (CELEXA) 20 MG tablet Take 1 tablet (20 mg total) by mouth daily. 30 tablet 2 10/08/2015 at 800a  . furosemide (LASIX) 20 MG tablet Take 2 tablets (40 mg total) by mouth daily. (Patient taking differently:  Take 20 mg by mouth 2 (two) times daily. ) 60 tablet 1 10/08/2015 at 800a  . Heparin Lock Flush (HEPARIN NICU/SCN FLUSH) 1 UNIT/ML SOLN Inject 3 mLs into the vein daily.   10/08/2015 at 1100a  . magnesium oxide (MAG-OX) 400 (241.3 Mg) MG tablet Take 1 tablet (400 mg total) by mouth 2 (two) times daily. 30 tablet 3 10/08/2015 at 800a  . omeprazole (PRILOSEC) 20 MG capsule Take 20 mg by mouth daily.   10/08/2015 at 730a  . oxyCODONE-acetaminophen (PERCOCET) 7.5-325 MG tablet Take 1 tablet by mouth every 6 (six) hours as needed for moderate pain or severe pain.   10/08/2015 at 1125a  . pantoprazole (PROTONIX) 40 MG tablet Take 1 tablet (40 mg total) by mouth 2 (two) times daily. 60 tablet 3 Past Month at Unknown time  . potassium chloride SA (K-DUR,KLOR-CON) 20 MEQ tablet Take 2 tablets (40 mEq total) by mouth daily. 30 tablet 1 10/08/2015 at 800a  . spironolactone (ALDACTONE) 25 MG tablet Take 1 tablet (25 mg total) by mouth 2 (two) times daily. 60 tablet 3 10/08/2015 at 800a  . sucralfate (CARAFATE) 1 GM/10ML suspension Take 10 mLs (1 g total) by mouth 4 (four) times daily -  with meals and at bedtime. 420 mL 0 10/08/2015 at 1200  . carisoprodol (SOMA) 350 MG tablet Take 1 tablet (350 mg total) by mouth 3 (three) times daily as needed for muscle spasms. (Patient not  taking: Reported on 10/08/2015) 30 tablet 0 unknown  . feeding supplement, ENSURE ENLIVE, (ENSURE ENLIVE) LIQD Take 237 mLs by mouth 2 (two) times daily between meals. (Patient not taking: Reported on 10/08/2015) 237 mL 12   . HYDROcodone-acetaminophen (NORCO/VICODIN) 5-325 MG tablet Take 1 tablet by mouth every 6 (six) hours as needed for severe pain. (Patient not taking: Reported on 10/08/2015) 6 tablet 0 09/15/2015 at Unknown time  . vancomycin 1,500 mg in sodium chloride 0.9 % 500 mL Inject 1,500 mg into the vein every other day. (Patient not taking: Reported on 10/08/2015) 1000 mL 3   . warfarin (COUMADIN) 2 MG tablet Take 1 tablet (2 mg total) by  mouth once. (Patient not taking: Reported on 10/08/2015) 30 tablet 1    Assessment: 70yo male.  Pharmacy consulted to manage Coumadin for VTE prophylaxis.  INR is therapeutic. Will continue with  daily.   Goal of Therapy:  INR 2-3 Monitor platelets by anticoagulation protocol: Yes   Plan:  Coumadin 2 mg po today x 1 INR daily Monitor CBC, s/sx of bleeding complications  Elder Cyphers, BS Loura Back, BCPS Clinical Pharmacist Pager [phone] 10/09/2015,12:05 PM

## 2015-10-09 NOTE — Consult Note (Signed)
Reason for Consult: Acute kidney injury superimposed on chronic Referring Physician: Dr. Toya Smothers is an 70 y.o. male.  HPI: He is a patient who has history of hypertension, COPD, liver cirrhosis, anasarca, chronic renal failure presently came because of worsening of his renal failure. Presently patient has epidural abscess status post drainage and on vancomycin. The last time patient was in the hospital his creatinine increased significantly, but his baseline but improved to 1.7 before his discharge to the nursing home. Presently patient was sent here from nursing home because of hyperkalemia and creatinine of 4.31. Patient also was increased anasarca. Patient denies any nausea or vomiting. But he has a poor appetite.  Past Medical History  Diagnosis Date  . COPD (chronic obstructive pulmonary disease) (HCC)   . Hypertension   . Hepatic steatosis 12/14/2011  . Compression fracture of L1 lumbar vertebra (HCC) 12/14/2011  . C2 cervical fracture (HCC)   . Duodenal ulcer with hemorrhage 12/15/2011    Likely source of bleeding.per EGD; Dr. Karilyn Cota  . Esophageal varices without bleeding (HCC) 12/15/2011    per EGD  . Cirrhosis (HCC) 12/16/2011    per ultrasound  . Hx of substance abuse 12/17/2011  . 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 Hepatic encephalopathy (HCC)   . Helicobacter pylori antibody positive 12/19/2011  . Encephalopathy 05/18/2012  . UTI (urinary tract infection) 12/13/2011  . Ascites 05/18/2012    S/p paracentesis yielding 7880 mL  . Cirrhosis (HCC)   . Depression     Past Surgical History  Procedure Laterality Date  . Coronary angioplasty with stent placement    . Thoracic laminectomy for epidural abscess N/A 09/07/2015    Procedure: THORACIC FIVE-THORACIC ELEVEN LAMINECTOMY FOR EPIDURAL ABSCESS;  Surgeon: Loura Halt Ditty, MD;  Location: [facility];  Service: Neurosurgery;  Laterality: N/A;    No family history on  file.  Social History:  reports that he has been smoking Cigarettes.  He has been smoking about 1.00 pack per day. He uses smokeless tobacco. He reports that he drinks alcohol. He reports that he uses illicit drugs (Cocaine).  Allergies: No Known Allergies  Medications: I have reviewed the patient's current medications.  Results for orders placed or performed during the hospital encounter of 10/08/15 (from the past 48 hour(s))  Comprehensive metabolic panel     Status: Abnormal   Collection Time: 10/08/15  7:15 PM  Result Value Ref Range   Sodium 133 (L) 135 - 145 mmol/L   Potassium 5.7 (H) 3.5 - 5.1 mmol/L   Chloride 102 101 - 111 mmol/L   CO2 24 22 - 32 mmol/L   Glucose, Bld 107 (H) 65 - 99 mg/dL   BUN 67 (H) 6 - 20 mg/dL   Creatinine, Ser 7.33 (H) 0.61 - 1.24 mg/dL   Calcium 7.8 (L) 8.9 - 10.3 mg/dL   Total Protein 6.3 (L) 6.5 - 8.1 g/dL   Albumin 2.1 (L) 3.5 - 5.0 g/dL   AST 57 (H) 15 - 41 U/L   ALT 25 17 - 63 U/L   Alkaline Phosphatase 89 38 - 126 U/L   Total Bilirubin 1.5 (H) 0.3 - 1.2 mg/dL   GFR calc non Af Amer 13 (L) >60 mL/min   GFR calc Af Amer 15 (L) >60 mL/min    Comment: (NOTE) The eGFR has been calculated using the CKD EPI equation. This calculation has not been validated in all clinical situations. eGFR's persistently <60 mL/min signify possible  Chronic Kidney Disease.    Anion gap 7 5 - 15  CBC with Differential     Status: Abnormal   Collection Time: 10/08/15  7:15 PM  Result Value Ref Range   WBC 13.1 (H) 4.0 - 10.5 K/uL   RBC 2.94 (L) 4.22 - 5.81 MIL/uL   Hemoglobin 9.7 (L) 13.0 - 17.0 g/dL   HCT 28.1 (L) 39.0 - 52.0 %   MCV 95.6 78.0 - 100.0 fL   MCH 33.0 26.0 - 34.0 pg   MCHC 34.5 30.0 - 36.0 g/dL   RDW 20.4 (H) 11.5 - 15.5 %   Platelets 134 (L) 150 - 400 K/uL   Neutrophils Relative % 61 %   Neutro Abs 8.1 (H) 1.7 - 7.7 K/uL   Lymphocytes Relative 12 %   Lymphs Abs 1.5 0.7 - 4.0 K/uL   Monocytes Relative 13 %   Monocytes Absolute 1.7 (H) 0.1  - 1.0 K/uL   Eosinophils Relative 13 %   Eosinophils Absolute 1.7 (H) 0.0 - 0.7 K/uL   Basophils Relative 1 %   Basophils Absolute 0.1 0.0 - 0.1 K/uL  Protime-INR     Status: Abnormal   Collection Time: 10/08/15  7:15 PM  Result Value Ref Range   Prothrombin Time 30.3 (H) 11.6 - 15.2 seconds   INR 2.96 (H) 0.00 - 1.49  Vancomycin, random     Status: None   Collection Time: 10/08/15  7:15 PM  Result Value Ref Range   Vancomycin Rm 30 ug/mL    Comment:        Random Vancomycin therapeutic range is dependent on dosage and time of specimen collection. A peak range is 20.0-40.0 ug/mL A trough range is 5.0-15.0 ug/mL        Performed at [facility]   Acetaminophen level     Status: Abnormal   Collection Time: 10/08/15  7:15 PM  Result Value Ref Range   Acetaminophen (Tylenol), Serum <10 (L) 10 - 30 ug/mL    Comment:        THERAPEUTIC CONCENTRATIONS VARY SIGNIFICANTLY. A RANGE OF 10-30 ug/mL MAY BE AN EFFECTIVE CONCENTRATION FOR MANY PATIENTS. HOWEVER, SOME ARE BEST TREATED AT CONCENTRATIONS OUTSIDE THIS RANGE. ACETAMINOPHEN CONCENTRATIONS >150 ug/mL AT 4 HOURS AFTER INGESTION AND >50 ug/mL AT 12 HOURS AFTER INGESTION ARE OFTEN ASSOCIATED WITH TOXIC REACTIONS.   MRSA PCR Screening     Status: None   Collection Time: 10/09/15  1:00 AM  Result Value Ref Range   MRSA by PCR NEGATIVE NEGATIVE    Comment:        The GeneXpert MRSA Assay (FDA approved for NASAL specimens only), is one component of a comprehensive MRSA colonization surveillance program. It is not intended to diagnose MRSA infection nor to guide or monitor treatment for MRSA infections.   CBC     Status: Abnormal   Collection Time: 10/09/15  6:09 AM  Result Value Ref Range   WBC 12.4 (H) 4.0 - 10.5 K/uL   RBC 2.71 (L) 4.22 - 5.81 MIL/uL   Hemoglobin 8.9 (L) 13.0 - 17.0 g/dL   HCT 26.1 (L) 39.0 - 52.0 %   MCV 96.3 78.0 - 100.0 fL   MCH 32.8 26.0 - 34.0 pg   MCHC 34.1 30.0 - 36.0 g/dL   RDW  20.6 (H) 11.5 - 15.5 %   Platelets 87 (L) 150 - 400 K/uL    Comment: SPECIMEN CHECKED FOR CLOTS PLATELET COUNT CONFIRMED BY SMEAR   Comprehensive metabolic  panel     Status: Abnormal   Collection Time: 10/09/15  6:09 AM  Result Value Ref Range   Sodium 135 135 - 145 mmol/L   Potassium 5.4 (H) 3.5 - 5.1 mmol/L   Chloride 102 101 - 111 mmol/L   CO2 25 22 - 32 mmol/L   Glucose, Bld 93 65 - 99 mg/dL   BUN 68 (H) 6 - 20 mg/dL   Creatinine, Ser 4.20 (H) 0.61 - 1.24 mg/dL   Calcium 8.0 (L) 8.9 - 10.3 mg/dL   Total Protein 6.1 (L) 6.5 - 8.1 g/dL   Albumin 2.1 (L) 3.5 - 5.0 g/dL   AST 53 (H) 15 - 41 U/L   ALT 25 17 - 63 U/L   Alkaline Phosphatase 87 38 - 126 U/L   Total Bilirubin 2.0 (H) 0.3 - 1.2 mg/dL   GFR calc non Af Amer 13 (L) >60 mL/min   GFR calc Af Amer 16 (L) >60 mL/min    Comment: (NOTE) The eGFR has been calculated using the CKD EPI equation. This calculation has not been validated in all clinical situations. eGFR's persistently <60 mL/min signify possible Chronic Kidney Disease.    Anion gap 8 5 - 15    Dg Chest 2 View  10/08/2015  CLINICAL DATA:  Weakness and short of breath EXAM: CHEST  2 VIEW COMPARISON:  09/26/2015 FINDINGS: Right arm PICC tip in the lower SVC unchanged Mild bibasilar atelectasis with some interval improvement. Negative for heart failure. No edema or effusion. Coronary stent. Skin staples in the posterior back from prior laminectomy. Severe compression fracture of T8 which has progressed since the prior MRI of 09/07/2015. IMPRESSION: Improvement in bibasilar atelectasis. No superimposed acute cardiothoracic abnormality. Progression of severe compression fracture T8 Electronically Signed   By: Franchot Gallo M.D.   On: 10/08/2015 18:55   Dg Abd 2 Views  10/08/2015  CLINICAL DATA:  Abdominal pain and distension. EXAM: ABDOMEN - 2 VIEW COMPARISON:  Chest radiograph earlier this day. Abdominal radiograph 09/26/2015 FINDINGS: Diffuse gaseous distention of  large and small bowel. Air under the right hemidiaphragm on supine view is felt to be within colon, no free air seen on chest radiographs earlier this day. Midline staples over the lower chest and upper abdomen. Pelvic phleboliths are seen. Detailed evaluation limited by soft tissue attenuation from body habitus. IMPRESSION: Diffuse gaseous distention of large and small bowel. Right upper quadrant air is felt to be within colon, no free air was seen on chest radiographs earlier this day. Electronically Signed   By: Jeb Levering M.D.   On: 10/08/2015 23:08    Review of Systems  Constitutional: Negative for fever and chills.  Respiratory: Negative for cough and shortness of breath.   Cardiovascular: Positive for leg swelling. Negative for chest pain and orthopnea.  Gastrointestinal: Positive for nausea, abdominal pain and diarrhea. Negative for vomiting.  Neurological: Positive for weakness.   Blood pressure 113/84, pulse 92, temperature 98.4 F (36.9 C), temperature source Oral, resp. rate 20, height '6\' 1"'$  (1.854 m), weight 113 lb 8 oz (51.483 kg), SpO2 95 %. Physical Exam  Constitutional: No distress.  Eyes: No scleral icterus.  Neck: JVD present.  Cardiovascular: Normal rate and regular rhythm.   Respiratory: No respiratory distress. He has no rales.  Decreased breath sound bilaterally  GI: He exhibits distension. There is no tenderness.  Musculoskeletal: He exhibits edema.  Neurological: He is alert.  Patient seems to be alert but restless. Complaining of abdominal pain,  back pain and also to get up and sit.  Skin: There is erythema.    Assessment/Plan: Problem #1 acute kidney injury superimposed on chronic: This could be from ATN/vancomycin associated acute kidney injury/prerenal. Presently his renal function is stable. Problem #2 hyperkalemia: His potassium is 5.4 improving Problem #3 chronic renal failure: Stage III. Problem #4 anasarca: Presently patient is significant sign of  fluid overload.  Problem #5 anemia: His hemoglobin and his low Problem #6 history of epidural abscess status post drainage Problem #7 hypertension: His blood pressure is reasonably controlled Problem #8 coagulopathy: INR is high Plan: We'll start patient on Lasix 160 mg IV twice a day 2] will use albumin 25 g IV twice a day for 2 days 3] will put Foley catheter to follow his output 4] will check renal panel in the morning.  Aurel Nguyen S 10/09/2015, 8:42 AM

## 2015-10-09 NOTE — Progress Notes (Signed)
[facility] called to check on patient.  Update given.  Will continue to monitor patient and report and changes.

## 2015-10-10 DIAGNOSIS — R109 Unspecified abdominal pain: Secondary | ICD-10-CM

## 2015-10-10 DIAGNOSIS — N189 Chronic kidney disease, unspecified: Secondary | ICD-10-CM

## 2015-10-10 DIAGNOSIS — K7290 Hepatic failure, unspecified without coma: Secondary | ICD-10-CM

## 2015-10-10 DIAGNOSIS — N179 Acute kidney failure, unspecified: Secondary | ICD-10-CM

## 2015-10-10 DIAGNOSIS — K7031 Alcoholic cirrhosis of liver with ascites: Secondary | ICD-10-CM

## 2015-10-10 LAB — RENAL FUNCTION PANEL
Albumin: 2.3 g/dL — ABNORMAL LOW (ref 3.5–5.0)
Anion gap: 8 (ref 5–15)
BUN: 67 mg/dL — AB (ref 6–20)
CHLORIDE: 102 mmol/L (ref 101–111)
CO2: 26 mmol/L (ref 22–32)
CREATININE: 3.78 mg/dL — AB (ref 0.61–1.24)
Calcium: 8.2 mg/dL — ABNORMAL LOW (ref 8.9–10.3)
GFR calc Af Amer: 18 mL/min — ABNORMAL LOW (ref >60)
GFR, EST NON AFRICAN AMERICAN: 15 mL/min — AB (ref >60)
GLUCOSE: 121 mg/dL — AB (ref 65–99)
Phosphorus: 4.9 mg/dL — ABNORMAL HIGH (ref 2.5–4.6)
Potassium: 4.1 mmol/L (ref 3.5–5.1)
Sodium: 136 mmol/L (ref 135–145)

## 2015-10-10 LAB — HEPATIC FUNCTION PANEL
ALBUMIN: 2.3 g/dL — AB (ref 3.5–5.0)
ALT: 23 U/L (ref 17–63)
AST: 48 U/L — AB (ref 15–41)
Alkaline Phosphatase: 77 U/L (ref 38–126)
BILIRUBIN DIRECT: 0.4 mg/dL (ref 0.1–0.5)
BILIRUBIN TOTAL: 1.1 mg/dL (ref 0.3–1.2)
Indirect Bilirubin: 0.7 mg/dL (ref 0.3–0.9)
Total Protein: 5.9 g/dL — ABNORMAL LOW (ref 6.5–8.1)

## 2015-10-10 LAB — BASIC METABOLIC PANEL
ANION GAP: 8 (ref 5–15)
BUN: 67 mg/dL — ABNORMAL HIGH (ref 6–20)
CO2: 27 mmol/L (ref 22–32)
Calcium: 8.3 mg/dL — ABNORMAL LOW (ref 8.9–10.3)
Chloride: 102 mmol/L (ref 101–111)
Creatinine, Ser: 3.85 mg/dL — ABNORMAL HIGH (ref 0.61–1.24)
GFR calc non Af Amer: 15 mL/min — ABNORMAL LOW (ref >60)
GFR, EST AFRICAN AMERICAN: 17 mL/min — AB (ref >60)
GLUCOSE: 123 mg/dL — AB (ref 65–99)
POTASSIUM: 4.2 mmol/L (ref 3.5–5.1)
Sodium: 137 mmol/L (ref 135–145)

## 2015-10-10 LAB — PROTIME-INR
INR: 2.54 — ABNORMAL HIGH (ref 0.00–1.49)
Prothrombin Time: 27 seconds — ABNORMAL HIGH (ref 11.6–15.2)

## 2015-10-10 LAB — VANCOMYCIN, RANDOM: Vancomycin Rm: 21 ug/mL

## 2015-10-10 LAB — AMMONIA: Ammonia: 54 umol/L — ABNORMAL HIGH (ref 9–35)

## 2015-10-10 MED ORDER — LORAZEPAM 1 MG PO TABS
1.0000 mg | ORAL_TABLET | Freq: Four times a day (QID) | ORAL | Status: DC | PRN
  Administered 2015-10-10 – 2015-10-15 (×12): 1 mg via ORAL
  Filled 2015-10-10 (×12): qty 1

## 2015-10-10 MED ORDER — ALBUTEROL SULFATE (2.5 MG/3ML) 0.083% IN NEBU
2.5000 mg | INHALATION_SOLUTION | RESPIRATORY_TRACT | Status: DC | PRN
  Administered 2015-10-10: 2.5 mg via RESPIRATORY_TRACT
  Filled 2015-10-10: qty 3

## 2015-10-10 MED ORDER — WARFARIN SODIUM 2 MG PO TABS
2.0000 mg | ORAL_TABLET | Freq: Once | ORAL | Status: AC
  Administered 2015-10-10: 2 mg via ORAL
  Filled 2015-10-10: qty 1

## 2015-10-10 MED ORDER — RIFAXIMIN 550 MG PO TABS
550.0000 mg | ORAL_TABLET | Freq: Two times a day (BID) | ORAL | Status: DC
  Administered 2015-10-10 – 2015-10-15 (×10): 550 mg via ORAL
  Filled 2015-10-10 (×10): qty 1

## 2015-10-10 NOTE — Progress Notes (Signed)
Patient noted removing his telemetry monitor and leads multiple times during the night, refusing to wear it. Central telemetry made aware. Discussed with patient, telemetry monitor reapplied and patient removed again. Will report to oncoming nurse regarding obtaining order for d/c telemetry monitor if possible.

## 2015-10-10 NOTE — Progress Notes (Signed)
ANTICOAGULATION CONSULT NOTE - consule  Pharmacy Consult for Coumadin Indication: VTE prophylaxis  No Known Allergies  Patient Measurements: Height:  (185.4 cm) Weight: 106 lb 1.6 oz (48.127 kg) IBW/kg (Calculated) : 79.9  Vital Signs: Temp: 98 F (36.7 C) (03/12 0616) Temp Source: Oral (03/12 0616) BP: 117/67 mmHg (03/12 0616) Pulse Rate: 87 (03/12 0616)  Labs:  Recent Labs  10/08/15 1915 10/09/15 0609 10/09/15 0958 10/10/15 0559  HGB 9.7* 8.9*  --   --   HCT 28.1* 26.1*  --   --   PLT 134* 87*  --   --   LABPROT 30.3*  --  27.2* 27.0*  INR 2.96*  --  2.56* 2.54*  CREATININE 4.31* 4.20*  --  3.85*  3.78*   Medical History: Past Medical History  Diagnosis Date  . COPD (chronic obstructive pulmonary disease) (HCC)   . Hypertension   . Hepatic steatosis 12/14/2011  . Compression fracture of L1 lumbar vertebra (HCC) 12/14/2011  . C2 cervical fracture (HCC)   . Duodenal ulcer with hemorrhage 12/15/2011    Likely source of bleeding.per EGD; Dr. Karilyn Cota  . Esophageal varices without bleeding (HCC) 12/15/2011    per EGD  . Cirrhosis (HCC) 12/16/2011    per ultrasound  . Hx of substance abuse 12/17/2011  . 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 Hepatic encephalopathy (HCC)   . Helicobacter pylori antibody positive 12/19/2011  . Encephalopathy 05/18/2012  . UTI (urinary tract infection) 12/13/2011  . Ascites 05/18/2012    S/p paracentesis yielding 7880 mL  . Cirrhosis (HCC)   . Depression    Medications:  Prescriptions prior to admission  Medication Sig Dispense Refill Last Dose  . albuterol (PROVENTIL) (2.5 MG/3ML) 0.083% nebulizer solution Take 3 mLs (2.5 mg total) by nebulization every 6 (six) hours as needed for wheezing or shortness of breath. 75 mL 12 unknown  . citalopram (CELEXA) 20 MG tablet Take 1 tablet (20 mg total) by mouth daily. 30 tablet 2 10/08/2015 at 800a  . furosemide (LASIX) 20 MG tablet Take 2 tablets (40  mg total) by mouth daily. (Patient taking differently: Take 20 mg by mouth 2 (two) times daily. ) 60 tablet 1 10/08/2015 at 800a  . Heparin Lock Flush (HEPARIN NICU/SCN FLUSH) 1 UNIT/ML SOLN Inject 3 mLs into the vein daily.   10/08/2015 at 1100a  . magnesium oxide (MAG-OX) 400 (241.3 Mg) MG tablet Take 1 tablet (400 mg total) by mouth 2 (two) times daily. 30 tablet 3 10/08/2015 at 800a  . omeprazole (PRILOSEC) 20 MG capsule Take 20 mg by mouth daily.   10/08/2015 at 730a  . oxyCODONE-acetaminophen (PERCOCET) 7.5-325 MG tablet Take 1 tablet by mouth every 6 (six) hours as needed for moderate pain or severe pain.   10/08/2015 at 1125a  . pantoprazole (PROTONIX) 40 MG tablet Take 1 tablet (40 mg total) by mouth 2 (two) times daily. 60 tablet 3 Past Month at Unknown time  . potassium chloride SA (K-DUR,KLOR-CON) 20 MEQ tablet Take 2 tablets (40 mEq total) by mouth daily. 30 tablet 1 10/08/2015 at 800a  . spironolactone (ALDACTONE) 25 MG tablet Take 1 tablet (25 mg total) by mouth 2 (two) times daily. 60 tablet 3 10/08/2015 at 800a  . sucralfate (CARAFATE) 1 GM/10ML suspension Take 10 mLs (1 g total) by mouth 4 (four) times daily -  with meals and at bedtime. 420 mL 0 10/08/2015 at 1200  . carisoprodol (SOMA) 350 MG tablet Take 1 tablet (  350 mg total) by mouth 3 (three) times daily as needed for muscle spasms. (Patient not taking: Reported on 10/08/2015) 30 tablet 0 unknown  . feeding supplement, ENSURE ENLIVE, (ENSURE ENLIVE) LIQD Take 237 mLs by mouth 2 (two) times daily between meals. (Patient not taking: Reported on 10/08/2015) 237 mL 12   . HYDROcodone-acetaminophen (NORCO/VICODIN) 5-325 MG tablet Take 1 tablet by mouth every 6 (six) hours as needed for severe pain. (Patient not taking: Reported on 10/08/2015) 6 tablet 0 09/15/2015 at Unknown time  . vancomycin 1,500 mg in sodium chloride 0.9 % 500 mL Inject 1,500 mg into the vein every other day. (Patient not taking: Reported on 10/08/2015) 1000 mL 3   . warfarin  (COUMADIN) 2 MG tablet Take 1 tablet (2 mg total) by mouth once. (Patient not taking: Reported on 10/08/2015) 30 tablet 1    Assessment: 70yo male.  Pharmacy consulted to manage Coumadin for VTE prophylaxis.  INR is therapeutic. Will continue with 2mg  daily.   Goal of Therapy:  INR 2-3 Monitor platelets by anticoagulation protocol: Yes   Plan:  Coumadin 2 mg po today x 1 INR daily Monitor CBC, s/sx of bleeding complications  Elder CyphersLorie Kista Robb, BS Loura BackPharm D, BCPS Clinical Pharmacist Pager [phone] 10/10/2015,8:17 AM

## 2015-10-10 NOTE — Progress Notes (Addendum)
Subjective: Patient still complains of back pain. Overall he doesn't feel good. His appetite is poor but no nausea or vomiting.   Objective: Vital signs in last 24 hours: Temp:  [98 F (36.7 C)-98.2 F (36.8 C)] 98 F (36.7 C) (03/12 0616) Pulse Rate:  [79-87] 87 (03/12 0616) Resp:  [20] 20 (03/12 0616) BP: (117-123)/(64-67) 117/67 mmHg (03/12 0616) SpO2:  [95 %-97 %] 96 % (03/12 0842) Weight:  [106 lb 1.6 oz (48.127 kg)] 106 lb 1.6 oz (48.127 kg) (03/12 0616)  Intake/Output from previous day: 03/11 0701 - 03/12 0700 In: 240 [P.O.:240] Out: 3800 [Urine:3800] Intake/Output this shift:     Recent Labs  10/08/15 1915 10/09/15 0609  HGB 9.7* 8.9*    Recent Labs  10/08/15 1915 10/09/15 0609  WBC 13.1* 12.4*  RBC 2.94* 2.71*  HCT 28.1* 26.1*  PLT 134* 87*    Recent Labs  10/09/15 0609 10/10/15 0559  NA 135 137  136  K 5.4* 4.2  4.1  CL 102 102  102  CO2 25 27  26   BUN 68* 67*  67*  CREATININE 4.20* 3.85*  3.78*  GLUCOSE 93 123*  121*  CALCIUM 8.0* 8.3*  8.2*    Recent Labs  10/09/15 0958 10/10/15 0559  INR 2.56* 2.54*    Generally patient is alert and in no apparent distress Chest is clear to auscultation Heart exam revealed regular rate and rhythm no murmur Abdomen: Distended but nontender. Extremities: Patient 3+ plus edema  Assessment/Plan: Problem #1 acute kidney injury superimposed on chronic. Presently the etiology was thought to be secondary to ATN versus vancomycin-induced acute kidney injury. Presently his BUN and creatinine is improving. His potassium is normal. Problem #2 anasarca: Patient presently on Lasix. He had 3800 mL of urine output. Problem #3 anemia: His hemoglobin is low Problem #4 liver cirrhosis Problem #5 history of epidural abscess: Status post drainage. Presently he is on antibiotics. Patient is afebrile but his white blood cell count is high seems to be improving Problem #6 hypertension: His blood pressure is  reasonably controlled Problem #7 history of COPD Plan: We'll continue his present management We'll check his basic metabolic panel and CBC in the morning.   Tierrah Anastos S 10/10/2015, 9:49 AM

## 2015-10-10 NOTE — Consult Note (Signed)
Referring Provider: No ref. provider found Primary Care Physician:  Isabella Stalling, MD Primary Gastroenterologist:  Dr. Karilyn Cota  Reason for Consultation:    Alcoholic liver disease and history of megacolon. Abdominal pain.  HPI:     Past Medical History  Diagnosis Date  . COPD (chronic obstructive pulmonary disease) (HCC)   . Hypertension   . Hepatic steatosis 12/14/2011  . Compression fracture of L1 lumbar vertebra (HCC) 12/14/2011  . C2 cervical fracture (HCC)   . Duodenal ulcer with hemorrhage 12/15/2011    Likely source of bleeding.per EGD; Dr. Karilyn Cota  . Esophageal varices without bleeding (HCC) 12/15/2011    per EGD  . Cirrhosis (HCC) 12/16/2011    per ultrasound  . Hx of substance abuse 12/17/2011  . 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 Hepatic encephalopathy (HCC)   . Helicobacter pylori antibody positive 12/19/2011  . Encephalopathy 05/18/2012  . UTI (urinary tract infection) 12/13/2011  . Ascites 05/18/2012    S/p paracentesis yielding 7880 mL  . Cirrhosis (HCC)   . Depression     Past Surgical History  Procedure Laterality Date  . Coronary angioplasty with stent placement    . Thoracic laminectomy for epidural abscess N/A 09/07/2015    Procedure: THORACIC FIVE-THORACIC ELEVEN LAMINECTOMY FOR EPIDURAL ABSCESS;  Surgeon: Loura Halt Ditty, MD;  Location: [facility];  Service: Neurosurgery;  Laterality: N/A;    Prior to Admission medications   Medication Sig Start Date End Date Taking? Authorizing Provider  albuterol (PROVENTIL) (2.5 MG/3ML) 0.083% nebulizer solution Take 3 mLs (2.5 mg total) by nebulization every 6 (six) hours as needed for wheezing or shortness of breath. 06/10/15  Yes Oneta Rack, NP  citalopram (CELEXA) 20 MG tablet Take 1 tablet (20 mg total) by mouth daily. 09/30/15  Yes Oval Linsey, MD  feeding supplement, ENSURE ENLIVE, (ENSURE ENLIVE) LIQD Take 237 mLs by mouth 2 (two) times daily between  meals. 09/30/15  Yes Oval Linsey, MD  furosemide (LASIX) 20 MG tablet Take 2 tablets (40 mg total) by mouth daily. Patient taking differently: Take 20 mg by mouth 2 (two) times daily.  09/30/15  Yes Oval Linsey, MD  Heparin Lock Flush (HEPARIN NICU/SCN FLUSH) 1 UNIT/ML SOLN Inject 3 mLs into the vein daily.   Yes Historical Provider, MD  magnesium oxide (MAG-OX) 400 (241.3 Mg) MG tablet Take 1 tablet (400 mg total) by mouth 2 (two) times daily. 09/30/15  Yes Oval Linsey, MD  omeprazole (PRILOSEC) 20 MG capsule Take 20 mg by mouth daily.   Yes Historical Provider, MD  oxyCODONE-acetaminophen (PERCOCET) 7.5-325 MG tablet Take 1 tablet by mouth every 6 (six) hours as needed for moderate pain or severe pain.   Yes Historical Provider, MD  pantoprazole (PROTONIX) 40 MG tablet Take 1 tablet (40 mg total) by mouth 2 (two) times daily. 09/30/15  Yes Oval Linsey, MD  potassium chloride SA (K-DUR,KLOR-CON) 20 MEQ tablet Take 2 tablets (40 mEq total) by mouth daily. 09/30/15  Yes Oval Linsey, MD  spironolactone (ALDACTONE) 25 MG tablet Take 1 tablet (25 mg total) by mouth 2 (two) times daily. 09/30/15  Yes Oval Linsey, MD  sucralfate (CARAFATE) 1 GM/10ML suspension Take 10 mLs (1 g total) by mouth 4 (four) times daily -  with meals and at bedtime. 09/30/15  Yes Oval Linsey, MD  carisoprodol (SOMA) 350 MG tablet Take 1 tablet (350 mg total) by mouth 3 (three) times daily as needed for muscle spasms. Patient not taking: Reported on  10/08/2015 09/13/15   Hollice EspySendil K Krishnan, MD  HYDROcodone-acetaminophen (NORCO/VICODIN) 5-325 MG tablet Take 1 tablet by mouth every 6 (six) hours as needed for severe pain. Patient not taking: Reported on 10/08/2015 09/13/15   Hollice EspySendil K Krishnan, MD  vancomycin 1,500 mg in sodium chloride 0.9 % 500 mL Inject 1,500 mg into the vein every other day. Patient not taking: Reported on 10/08/2015 09/30/15   Oval Linseyichard Dondiego, MD  warfarin (COUMADIN) 2 MG tablet Take 1 tablet (2  mg total) by mouth once. Patient not taking: Reported on 10/08/2015 09/30/15   Oval Linseyichard Dondiego, MD    Current Facility-Administered Medications  Medication Dose Route Frequency Provider Last Rate Last Dose  . 0.9 %  sodium chloride infusion   Intravenous Continuous Meredeth IdeGagan S Lama, MD 10 mL/hr at 10/08/15 2300    . albumin human 25 % solution 25 g  25 g Intravenous BID Salomon MastBelayenh Befekadu, MD   25 g at 10/10/15 1237  . albuterol (PROVENTIL) (2.5 MG/3ML) 0.083% nebulizer solution 2.5 mg  2.5 mg Nebulization Q6H PRN Meredeth IdeGagan S Lama, MD   5 mg at 10/09/15 2118  . citalopram (CELEXA) tablet 20 mg  20 mg Oral Daily Meredeth IdeGagan S Lama, MD   20 mg at 10/10/15 1238  . feeding supplement (ENSURE ENLIVE) (ENSURE ENLIVE) liquid 237 mL  237 mL Oral BID BM Meredeth IdeGagan S Lama, MD   237 mL at 10/09/15 1400  . furosemide (LASIX) 160 mg in dextrose 5 % 50 mL IVPB  160 mg Intravenous BID Salomon MastBelayenh Befekadu, MD   160 mg at 10/10/15 1238  . ipratropium-albuterol (DUONEB) 0.5-2.5 (3) MG/3ML nebulizer solution 3 mL  3 mL Nebulization Q6H WA Oval Linseyichard Dondiego, MD   3 mL at 10/10/15 0842  . LORazepam (ATIVAN) tablet 1 mg  1 mg Oral Q6H PRN Oval Linseyichard Dondiego, MD   1 mg at 10/10/15 1238  . naloxegol oxalate (MOVANTIK) tablet 25 mg  25 mg Oral Daily Oval Linseyichard Dondiego, MD   25 mg at 10/09/15 1613  . ondansetron (ZOFRAN) tablet 4 mg  4 mg Oral Q6H PRN Meredeth IdeGagan S Lama, MD       Or  . ondansetron (ZOFRAN) injection 4 mg  4 mg Intravenous Q6H PRN Meredeth IdeGagan S Lama, MD   4 mg at 10/09/15 1959  . oxyCODONE (Oxy IR/ROXICODONE) immediate release tablet 5 mg  5 mg Oral Q4H PRN Meredeth IdeGagan S Lama, MD   5 mg at 10/09/15 1746  . oxyCODONE (Oxy IR/ROXICODONE) immediate release tablet 5 mg  5 mg Oral Q4H PRN Oval Linseyichard Dondiego, MD   5 mg at 10/10/15 1238  . pantoprazole (PROTONIX) EC tablet 40 mg  40 mg Oral BID Meredeth IdeGagan S Lama, MD   40 mg at 10/10/15 1238  . sucralfate (CARAFATE) 1 GM/10ML suspension 1 g  1 g Oral TID WC & HS Oval Linseyichard Dondiego, MD   1 g at 10/10/15 1239  .  warfarin (COUMADIN) tablet 2 mg  2 mg Oral Once Oval Linseyichard Dondiego, MD      . Warfarin - Pharmacist Dosing Inpatient   Does not apply [id number] Oval Linseyichard Dondiego, MD        Allergies as of 10/08/2015  . (No Known Allergies)    No family history on file.  Social History   Social History  . Marital Status: Divorced    Spouse Name: N/A  . Number of Children: N/A  . Years of Education: N/A   Occupational History  . Not on file.   Social History Main  Topics  . Smoking status: Current Every Day Smoker -- 1.00 packs/day    Types: Cigarettes  . Smokeless tobacco: Current User     Comment: 1 1/2 pack a day  . Alcohol Use: Yes     Comment: drinking everyday.  . Drug Use: Yes    Special: Cocaine     Comment: prescription drugs/   says no cocaine in 1 month 07/13/15  . Sexual Activity: Not Currently   Other Topics Concern  . Not on file   Social History Narrative    Review of Systems: See HPI, otherwise normal ROS  Physical Exam: Temp:  [98 F (36.7 C)-98.2 F (36.8 C)] 98 F (36.7 C) (03/12 0616) Pulse Rate:  [79-87] 87 (03/12 0616) Resp:  [20] 20 (03/12 0616) BP: (117-123)/(64-67) 117/67 mmHg (03/12 0616) SpO2:  [95 %-97 %] 96 % (03/12 0842) Weight:  [106 lb 1.6 oz (48.127 kg)] 106 lb 1.6 oz (48.127 kg) (03/12 0616) Last BM Date: 10/09/15  Patient is alert and appears to be uncomfortable. He appears to be chronically ill. Patient does not have asterixis but he has tremors involving both hands. Conjunctiva is pale. Sclerae is nonicteric. Oropharyngeal mucosa is normal. He is edentulous. No neck masses or thyromegaly noted. Cardiac exam with regular rhythm normal S1 and S2. No murmur or gallop noted. Abdomen is mildly distended. Bowel sounds are normal. On palpation abdomen is soft with mild tenderness across upper abdomen. No organomegaly or masses. He has mild scrotal edema. He has 3+ pitting edema involving both legs. He also has edema involving his  flanks. Examination of his back reveals long well-healed midline scar across thoracic spine. He has small sentinel skin tags. There is small amount of fresh blood on bedsheet. He has days to sacral decub about 5 cm in diameter.  Intake/Output from previous day: 03/11 0701 - 03/12 0700 In: 240 [P.O.:240] Out: 3800 [Urine:3800] Intake/Output this shift:    Lab Results:  Recent Labs  10/08/15 1915 10/09/15 0609  WBC 13.1* 12.4*  HGB 9.7* 8.9*  HCT 28.1* 26.1*  PLT 134* 87*   BMET  Recent Labs  10/08/15 1915 10/09/15 0609 10/10/15 0559  NA 133* 135 137  136  K 5.7* 5.4* 4.2  4.1  CL 102 102 102  102  CO2 GLUCOSE 107* 93 123*  121*  BUN 67* 68* 67*  67*  CREATININE 4.31* 4.20* 3.85*  3.78*  CALCIUM 7.8* 8.0* 8.3*  8.2*   LFT  Recent Labs  10/10/15 0559  PROT 5.9*  ALBUMIN 2.3*  2.3*  AST 48*  ALT 23  ALKPHOS 77  BILITOT 1.1  BILIDIR 0.4  IBILI 0.7   PT/INR  Recent Labs  10/09/15 0958 10/10/15 0559  LABPROT 27.2* 27.0*  INR 2.56* 2.54*    Studies/Results: Dg Chest 2 View  10/08/2015  CLINICAL DATA:  Weakness and short of breath EXAM: CHEST  2 VIEW COMPARISON:  09/26/2015 FINDINGS: Right arm PICC tip in the lower SVC unchanged Mild bibasilar atelectasis with some interval improvement. Negative for heart failure. No edema or effusion. Coronary stent. Skin staples in the posterior back from prior laminectomy. Severe compression fracture of T8 which has progressed since the prior MRI of 09/07/2015. IMPRESSION: Improvement in bibasilar atelectasis. No superimposed acute cardiothoracic abnormality. Progression of severe compression fracture T8 Electronically Signed   By: Marlan Palau M.D.   On: 10/08/2015 18:55   Dg Abd 2 Views  10/08/2015  CLINICAL DATA:  Abdominal pain and distension. EXAM: ABDOMEN - 2 VIEW COMPARISON:  Chest radiograph earlier this day. Abdominal radiograph 09/26/2015 FINDINGS: Diffuse gaseous distention of large and  small bowel. Air under the right hemidiaphragm on supine view is felt to be within colon, no free air seen on chest radiographs earlier this day. Midline staples over the lower chest and upper abdomen. Pelvic phleboliths are seen. Detailed evaluation limited by soft tissue attenuation from body habitus. IMPRESSION: Diffuse gaseous distention of large and small bowel. Right upper quadrant air is felt to be within colon, no free air was seen on chest radiographs earlier this day. Electronically Signed   By: Rubye Oaks M.D.   On: 10/08/2015 23:08   abdominal films reviewed and compared with prior studies. Colonic distention appears to be much less primarily involving transverse colon.  Assessment;  Patient is 70 year old Caucasian male with multiple medical problems including decompensated alcoholic cirrhosis who now presents with worsening renal failure. He was just discharged from this facility 10 days ago after hospitalization for more than 2 weeks for renal failure and anasarca and megacolon. 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 Dr. Fausto Skillern is assisting Dr. Delbert Harness and management of his renal failure and renal function is improving. He also has developed stage II left sacral decubitus ulcer.  He has following GI issues; #1. Alcoholic cirrhosis complicated by thrombocytopenia hepatic encephalopathy as well as ascites which on prior studies has been minimal. #2. Anasarca. If his weights are correct he has lost more than 70 pounds of fluid in the last 4 weeks. He still has significant lower extremity edema. He is diuresing well. Patient is receiving IV albumin. #3. Abdominal pain. He has chronic abdominal pain. He is on narcotic analgesia. #4. History of megacolon. Abdomen is highly distended and his bowels are moving with help of movantik. #5. Anemia. Anemia appears to be chronic disease. Scant amount of  blood noted on his bed sheet possibly secretory to hemorrhoids.  Recommendations;  Will check serum ammonia today and restart Xifaxan if serum ammonia is elevated. Discontinue sucralfate.   LOS: 2 days   Chloee Tena U  10/10/2015, 1:37 PM

## 2015-10-10 NOTE — Progress Notes (Signed)
Pharmacy Antibiotic Note  Roberto [name] is a 70 y.o. male admitted on 10/08/2015 with MRSA bacteremia with a spinal abscess. Planned vancomycin through 11/02/2015. Pharmacy has been consulted for Vancomycin dosing. Patient has AKI on top of chronic renal insufficiency.  Vancomycin random level  3/10 at 1900 was 4830mcg/ml--> 3/11 at 100 was 5725mcg/ml--> 3/12 0600 was 421mcg/ml. Estimated pharmacokinetic parameters are changing due fluctutation in renal fxn .  Ke=0.087, and t 1/2= 79  hours. Will recheck Vancomycin random level in 24 hours. Will redose when levels <1920mcg/ml.   Goal of therapy: Vanc trough 15-20 mcg/ml Plan: Check Vancomycin trough when appropriate F/u renal function and clinical course  Height: 6\' 1"  (185.4 cm) Weight: 106 lb 1.6 oz (48.127 kg) IBW/kg (Calculated) : 79.9  Temp (24hrs), Avg:98.1 F (36.7 C), Min:98 F (36.7 C), Max:98.2 F (36.8 C)   Recent Labs Lab 10/08/15 1915 10/09/15 0609 10/09/15 0958 10/10/15 0559  WBC 13.1* 12.4*  --   --   CREATININE 4.31* 4.20*  --  3.85*  3.78*  VANCORANDOM 30  --  25 21    Estimated Creatinine Clearance: 12.9 mL/min (by C-G formula based on Cr of 3.78).    No Known Allergies  Antimicrobials this admission: vanc 1/29 >> 2/2, 2/8 >> Zosyn 1/29 >> 2/1 ceftaroline 2/2>> 2/8  Dose adjustments this admission: 2/1 VT 26 on 1 gm q12, dose changed to 1250 q24 2/13 VT 23 on 750 mg Q12H SCr 1.43 2/17 VT 21 on 1250 mg every 24 hours SCR 1.87 2/20 VT 18 on 1gm q24hrs Scr 2.31  2/23 VT 26 on 1 gm q24, dose changed to 1500 q48h scr 2.34   Thank you for allowing pharmacy to be a part of this patient's care. Elder CyphersLorie Zigmund Linse, BS Pharm D, BCPS Clinical Pharmacist Pager [phone] 10/10/2015 8:20 AM

## 2015-10-10 NOTE — Progress Notes (Signed)
Patient somewhat, less anxious today after Ativan every 6 hours 0.5 in the midst of despair over paraplegia other multifactorial medical issues Q on chronic renal failure will request pastoral care consult spoke with family at length yesterday had the patient focused on his hospitalization is to improve renal function and finish antibiotics for an additional 3 weeks hopefully he will understand these 2 issues expressed desire not to live like this any longer. He is on Celexa 20 mg I will increase Ativan 0.5-1 mg every 6 hours when necessary Honor JunesRonald J Rollins ZOX:[id number] DOB: 1946/01/08 DOA: 10/08/2015 PCP: Isabella StallingNDIEGO,Edlin Ford M, MD             Physical Exam: Blood pressure 117/67, pulse 87, temperature 98 F (36.7 C), temperature source Oral, resp. rate 20, height 6\' 1"  (1.854 m), weight 106 lb 1.6 oz (48.127 kg), SpO2 96 %. lungs show diminished breath sounds in bases no rales wheeze rhonchi appreciable heart regular rhythm no S3-S4 measles rubs abdomen bowel sounds normoactive no peristaltic rushes no guarding or rebound no detectable organomegaly   Investigations:  Recent Results (from the past 240 hour(s))  MRSA PCR Screening     Status: None   Collection Time: 10/09/15  1:00 AM  Result Value Ref Range Status   MRSA by PCR NEGATIVE NEGATIVE Final    Comment:        The GeneXpert MRSA Assay (FDA approved for NASAL specimens only), is one component of a comprehensive MRSA colonization surveillance program. It is not intended to diagnose MRSA infection nor to guide or monitor treatment for MRSA infections.      Basic Metabolic Panel:  Recent Labs  81/19/1402/06/16 0609 10/10/15 0559  NA 135 137  136  K 5.4* 4.2  4.1  CL 102 102  102  CO2 25 27  26   GLUCOSE 93 123*  121*  BUN 68* 67*  67*  CREATININE 4.20* 3.85*  3.78*  CALCIUM 8.0* 8.3*  8.2*  PHOS  --  4.9*   Liver Function Tests:  Recent Labs  10/09/15 0609 10/10/15 0559  AST 53* 48*  ALT 25 23  ALKPHOS  87 77  BILITOT 2.0* 1.1  PROT 6.1* 5.9*  ALBUMIN 2.1* 2.3*  2.3*     CBC:  Recent Labs  10/08/15 1915 10/09/15 0609  WBC 13.1* 12.4*  NEUTROABS 8.1*  --   HGB 9.7* 8.9*  HCT 28.1* 26.1*  MCV 95.6 96.3  PLT 134* 87*    Dg Chest 2 View  10/08/2015  CLINICAL DATA:  Weakness and short of breath EXAM: CHEST  2 VIEW COMPARISON:  09/26/2015 FINDINGS: Right arm PICC tip in the lower SVC unchanged Mild bibasilar atelectasis with some interval improvement. Negative for heart failure. No edema or effusion. Coronary stent. Skin staples in the posterior back from prior laminectomy. Severe compression fracture of T8 which has progressed since the prior MRI of 09/07/2015. IMPRESSION: Improvement in bibasilar atelectasis. No superimposed acute cardiothoracic abnormality. Progression of severe compression fracture T8 Electronically Signed   By: Marlan Palauharles  Clark M.D.   On: 10/08/2015 18:55   Dg Abd 2 Views  10/08/2015  CLINICAL DATA:  Abdominal pain and distension. EXAM: ABDOMEN - 2 VIEW COMPARISON:  Chest radiograph earlier this day. Abdominal radiograph 09/26/2015 FINDINGS: Diffuse gaseous distention of large and small bowel. Air under the right hemidiaphragm on supine view is felt to be within colon, no free air seen on chest radiographs earlier this day. Midline staples over the lower chest and upper abdomen.  Pelvic phleboliths are seen. Detailed evaluation limited by soft tissue attenuation from body habitus. IMPRESSION: Diffuse gaseous distention of large and small bowel. Right upper quadrant air is felt to be within colon, no free air was seen on chest radiographs earlier this day. Electronically Signed   By: Rubye Oaks M.D.   On: 10/08/2015 23:08      Medications:   Impression:  Active Problems:   Epidural abscess   DNR (do not resuscitate) discussion   Renal failure (ARF), acute on chronic (HCC)   AKI (acute kidney injury) (HCC)     Plan: Pastoral care consult increase Ativan  to 1 mg every 6 hours   Consultants: Gastroenterology pharmacy for vancomycin and Coumadin and nephrology   Procedures   Antibiotics: IV vancomycin                  Code Status:  Family Communication:  Spoke with son yesterday  Disposition Plan   Time spent: 30 minutes   LOS: 2 days   Lum Stillinger M   10/10/2015, 11:10 AM

## 2015-10-10 NOTE — Progress Notes (Signed)
Dr. Karilyn Cotaehman notified of the patients ammonia level.  New orders given and followed.

## 2015-10-11 LAB — BASIC METABOLIC PANEL
ANION GAP: 8 (ref 5–15)
BUN: 58 mg/dL — ABNORMAL HIGH (ref 6–20)
CHLORIDE: 100 mmol/L — AB (ref 101–111)
CO2: 30 mmol/L (ref 22–32)
Calcium: 8.2 mg/dL — ABNORMAL LOW (ref 8.9–10.3)
Creatinine, Ser: 3.16 mg/dL — ABNORMAL HIGH (ref 0.61–1.24)
GFR calc non Af Amer: 19 mL/min — ABNORMAL LOW (ref >60)
GFR, EST AFRICAN AMERICAN: 22 mL/min — AB (ref >60)
Glucose, Bld: 98 mg/dL (ref 65–99)
POTASSIUM: 3.4 mmol/L — AB (ref 3.5–5.1)
SODIUM: 138 mmol/L (ref 135–145)

## 2015-10-11 LAB — CBC
HCT: 24.2 % — ABNORMAL LOW (ref 39.0–52.0)
HEMOGLOBIN: 8.2 g/dL — AB (ref 13.0–17.0)
MCH: 33.3 pg (ref 26.0–34.0)
MCHC: 33.9 g/dL (ref 30.0–36.0)
MCV: 98.4 fL (ref 78.0–100.0)
PLATELETS: 110 10*3/uL — AB (ref 150–400)
RBC: 2.46 MIL/uL — AB (ref 4.22–5.81)
RDW: 20.2 % — ABNORMAL HIGH (ref 11.5–15.5)
WBC: 8.7 10*3/uL (ref 4.0–10.5)

## 2015-10-11 LAB — HEPATIC FUNCTION PANEL
ALBUMIN: 2.6 g/dL — AB (ref 3.5–5.0)
ALT: 23 U/L (ref 17–63)
AST: 59 U/L — AB (ref 15–41)
Alkaline Phosphatase: 67 U/L (ref 38–126)
BILIRUBIN DIRECT: 0.4 mg/dL (ref 0.1–0.5)
BILIRUBIN INDIRECT: 0.9 mg/dL (ref 0.3–0.9)
TOTAL PROTEIN: 6 g/dL — AB (ref 6.5–8.1)
Total Bilirubin: 1.3 mg/dL — ABNORMAL HIGH (ref 0.3–1.2)

## 2015-10-11 LAB — PROTIME-INR
INR: 2.54 — ABNORMAL HIGH (ref 0.00–1.49)
Prothrombin Time: 27 seconds — ABNORMAL HIGH (ref 11.6–15.2)

## 2015-10-11 LAB — VANCOMYCIN, RANDOM: VANCOMYCIN RM: 17 ug/mL

## 2015-10-11 MED ORDER — OXYCODONE HCL 5 MG PO TABS
10.0000 mg | ORAL_TABLET | Freq: Four times a day (QID) | ORAL | Status: DC | PRN
  Administered 2015-10-11 – 2015-10-15 (×12): 10 mg via ORAL
  Filled 2015-10-11 (×13): qty 2

## 2015-10-11 MED ORDER — WARFARIN SODIUM 2 MG PO TABS
2.0000 mg | ORAL_TABLET | Freq: Once | ORAL | Status: AC
  Administered 2015-10-11: 2 mg via ORAL
  Filled 2015-10-11: qty 1

## 2015-10-11 MED ORDER — VANCOMYCIN HCL IN DEXTROSE 1-5 GM/200ML-% IV SOLN
1000.0000 mg | Freq: Once | INTRAVENOUS | Status: AC
  Administered 2015-10-11: 1000 mg via INTRAVENOUS

## 2015-10-11 MED ORDER — POTASSIUM CHLORIDE CRYS ER 20 MEQ PO TBCR
40.0000 meq | EXTENDED_RELEASE_TABLET | Freq: Two times a day (BID) | ORAL | Status: DC
  Administered 2015-10-11 (×2): 40 meq via ORAL
  Filled 2015-10-11 (×2): qty 2

## 2015-10-11 NOTE — Progress Notes (Signed)
ANTICOAGULATION CONSULT NOTE - consule  Pharmacy Consult for Coumadin Indication: VTE prophylaxis  No Known Allergies  Patient Measurements: Height: 6\' 1"  (185.4 cm) Weight: 106 lb 1.6 oz (48.127 kg) IBW/kg (Calculated) : 79.9  Vital Signs: Temp: 97.8 F (36.6 C) (03/13 0547) Temp Source: Oral (03/13 0547) BP: 111/63 mmHg (03/13 0547) Pulse Rate: 85 (03/13 0547)  Labs:  Recent Labs  10/08/15 1915 10/09/15 0609 10/09/15 0958 10/10/15 0559 10/11/15 0707  HGB 9.7* 8.9*  --   --  8.2*  HCT 28.1* 26.1*  --   --  24.2*  PLT 134* 87*  --   --  110*  LABPROT 30.3*  --  27.2* 27.0* 27.0*  INR 2.96*  --  2.56* 2.54* 2.54*  CREATININE 4.31* 4.20*  --  3.85*  3.78* 3.16*   Medical History: Past Medical History  Diagnosis Date  . COPD (chronic obstructive pulmonary disease) (HCC)   . Hypertension   . Hepatic steatosis 12/14/2011  . Compression fracture of L1 lumbar vertebra (HCC) 12/14/2011  . C2 cervical fracture (HCC)   . Duodenal ulcer with hemorrhage 12/15/2011    Likely source of bleeding.per EGD; Dr. Karilyn Cotaehman  . Esophageal varices without bleeding (HCC) 12/15/2011    per EGD  . Cirrhosis (HCC) 12/16/2011    per ultrasound  . Hx of substance abuse 12/17/2011  . 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 Hepatic encephalopathy (HCC)   . Helicobacter pylori antibody positive 12/19/2011  . Encephalopathy 05/18/2012  . UTI (urinary tract infection) 12/13/2011  . Ascites 05/18/2012    S/p paracentesis yielding 7880 mL  . Cirrhosis (HCC)   . Depression    Medications:  Prescriptions prior to admission  Medication Sig Dispense Refill Last Dose  . albuterol (PROVENTIL) (2.5 MG/3ML) 0.083% nebulizer solution Take 3 mLs (2.5 mg total) by nebulization every 6 (six) hours as needed for wheezing or shortness of breath. 75 mL 12 unknown  . citalopram (CELEXA) 20 MG tablet Take 1 tablet (20 mg total) by mouth daily. 30 tablet 2 10/08/2015 at 800a  .  feeding supplement, ENSURE ENLIVE, (ENSURE ENLIVE) LIQD Take 237 mLs by mouth 2 (two) times daily between meals. 237 mL 12 10/09/2015 at Unknown time  . furosemide (LASIX) 20 MG tablet Take 2 tablets (40 mg total) by mouth daily. (Patient taking differently: Take 20 mg by mouth 2 (two) times daily. ) 60 tablet 1 10/08/2015 at 800a  . Heparin Lock Flush (HEPARIN NICU/SCN FLUSH) 1 UNIT/ML SOLN Inject 3 mLs into the vein daily.   10/08/2015 at 1100a  . magnesium oxide (MAG-OX) 400 (241.3 Mg) MG tablet Take 1 tablet (400 mg total) by mouth 2 (two) times daily. 30 tablet 3 10/08/2015 at 800a  . omeprazole (PRILOSEC) 20 MG capsule Take 20 mg by mouth daily.   10/08/2015 at 730a  . oxyCODONE-acetaminophen (PERCOCET) 7.5-325 MG tablet Take 1 tablet by mouth every 6 (six) hours as needed for moderate pain or severe pain.   10/08/2015 at 1125a  . pantoprazole (PROTONIX) 40 MG tablet Take 1 tablet (40 mg total) by mouth 2 (two) times daily. 60 tablet 3 Past Month at Unknown time  . potassium chloride SA (K-DUR,KLOR-CON) 20 MEQ tablet Take 2 tablets (40 mEq total) by mouth daily. 30 tablet 1 10/08/2015 at 800a  . spironolactone (ALDACTONE) 25 MG tablet Take 1 tablet (25 mg total) by mouth 2 (two) times daily. 60 tablet 3 10/08/2015 at 800a  . sucralfate (CARAFATE) 1 GM/10ML suspension  Take 10 mLs (1 g total) by mouth 4 (four) times daily -  with meals and at bedtime. 420 mL 0 10/08/2015 at 1200  . carisoprodol (SOMA) 350 MG tablet Take 1 tablet (350 mg total) by mouth 3 (three) times daily as needed for muscle spasms. (Patient not taking: Reported on 10/08/2015) 30 tablet 0 unknown  . HYDROcodone-acetaminophen (NORCO/VICODIN) 5-325 MG tablet Take 1 tablet by mouth every 6 (six) hours as needed for severe pain. (Patient not taking: Reported on 10/08/2015) 6 tablet 0 09/15/2015 at Unknown time  . vancomycin 1,500 mg in sodium chloride 0.9 % 500 mL Inject 1,500 mg into the vein every other day. (Patient not taking: Reported on  10/08/2015) 1000 mL 3   . warfarin (COUMADIN) 2 MG tablet Take 1 tablet (2 mg total) by mouth once. (Patient not taking: Reported on 10/08/2015) 30 tablet 1    Assessment: 70yo male.  Pharmacy consulted to manage Coumadin for VTE prophylaxis.  INR is therapeutic at 2.54. Will continue with  daily.   Goal of Therapy:  INR 2-3 Monitor platelets by anticoagulation protocol: Yes   Plan:  Coumadin 2 mg po today x 1 INR daily Monitor CBC, s/sx of bleeding complications  Elder Cyphers, BS Loura Back, BCPS Clinical Pharmacist Pager [phone] 10/11/2015,8:18 AM

## 2015-10-11 NOTE — Clinical Social Work Note (Addendum)
Clinical Social Work Assessment  Patient Details  Name: Roberto [name] MRN: [id number] Date of Birth: September 17, 1945  Date of referral:  10/11/15               Reason for consult:  [facility]                Permission sought to share information with:  Case Production designer, theatre/television/filmManager, Magazine features editoracility Contact Representative, Family Supports Permission granted to share information::  Yes, Verbal Permission Granted  Name::        [facility]  Relationship::     Contact Information:     Housing/Transportation Living arrangements for the past 2 months:  Skilled Building surveyorursing Facility Source of Information:  Patient, Development worker, communityMedical Team, Other (Comment Required) (Engineer, maintenance (IT)facility liason) Patient Interpreter Needed:  None Criminal Activity/Legal Involvement Pertinent to Current Situation/Hospitalization:  No - Comment as needed Significant Relationships:  Other Family Members Lives with:  Facility Resident Do you feel safe going back to the place where you live?  Yes wants to return to SNF Need for family participation in patient care:  Yes (Comment) (per request)  Care giving concerns:  No care concerns identified at this time.  Plan is to return to SNF at DC.    Social Worker assessment / plan:  LCSW spoke with liason for facility. Aware of patient being admitted and agreeable for return. Patient decided to not do a bed hold and plans to return at DC when medically stable.  Currently he has a bed per facility, but has to potential to lose bed if filled. LCSW to facilitate dc back to facility once medically cleared. FL2 updated and MD to sign. Clinicals faxed to facility for admission and updates.   Following for acute care while in hospitals due to being from facility.  Employment status:    Insurance information:  Medicare PT Recommendations:  Skilled Nursing Facility Information / Referral to community resources:  Skilled Nursing Facility  Patient/Family's Response to  care:  Agreeable to plan  Patient/Family's Understanding of and Emotional Response to Diagnosis, Current Treatment, and Prognosis:  NO barriers with understanding or problems. Patient content and calm in room, feeling better per report and understanding of his current treatment plan and DC plan.  Emotional Assessment Appearance:  Appears stated age Attitude/Demeanor/Rapport:  Other (cooperative and calm) Affect (typically observed):  Accepting, Adaptable, Appropriate Orientation:  Oriented to Self, Oriented to Place, Oriented to  Time, Oriented to Situation Alcohol / Substance use:  Not Applicable Psych involvement (Current and /or in the community):  No (Comment)  Discharge Needs  Concerns to be addressed:  No discharge needs identified Readmission within the last 30 days:  No Current discharge risk:  None Barriers to Discharge:  No Barriers Identified, Continued Medical Work up   Raye SorrowCoble, Kadia Abaya N, LCSW 10/11/2015, 3:28 PM

## 2015-10-11 NOTE — NC FL2 (Signed)
[place] MEDICAID FL2 LEVEL OF CARE SCREENING TOOL     IDENTIFICATION  Patient Name: Roberto [name] Birthdate: 17-Nov-1945 Sex: male Admission Date (Current Location): 10/08/2015  [facility] and [place] Number:  [employer] and Address:  [facility],  [address], [place] [address]      Provider Number: [id number]  Attending Physician Name and Address:  Oval Linsey, MD  Relative Name and Phone Number:       Current Level of Care: Hospital Recommended Level of Care: Skilled Nursing Facility Prior Approval Number:    Date Approved/Denied:   PASRR Number:    Discharge Plan: SNF    Current Diagnoses: Patient Active Problem List   Diagnosis Date Noted  . Renal failure (ARF), acute on chronic (HCC) 10/08/2015  . AKI (acute kidney injury) (HCC) 10/08/2015  . Palliative care encounter   . DNR (do not resuscitate) discussion   . Ileus (HCC) 09/15/2015  . Abdominal pain 09/15/2015  . Cirrhosis of liver with ascites (HCC)   . MRSA infection   . Malnutrition of moderate degree 09/13/2015  . Chronic diastolic heart failure (HCC) 09/12/2015  . 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 ETOH abuse 08/27/2015  . Alcohol dependence with uncomplicated withdrawal (HCC)   . 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 Alcohol dependence (HCC) 06/02/2015  . Acute encephalopathy 09/20/2014  . Alcoholic ketoacidosis 09/20/2014  . Delirium tremens (HCC) 09/19/2014  . Hypokalemia 05/20/2012  . Acute respiratory failure (HCC) 05/19/2012  . Severe protein-calorie malnutrition (HCC) 05/19/2012  . Sepsis (HCC) 05/19/2012  . Alcohol withdrawal (HCC) 05/18/2012  . Encephalopathy 05/18/2012  . Dyspnea 05/18/2012  . Ascites 05/18/2012  . Volume overload 05/18/2012  . Helicobacter pylori antibody positive 12/19/2011  . Adynamic ileus (HCC) 12/18/2011  . 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 Hx  of substance abuse 12/17/2011  . Cirrhosis (HCC) 12/16/2011  . Abdominal pain, diffuse 12/15/2011  . Thrombocytopenia (HCC) 12/15/2011  . Duodenal ulcer with hemorrhage 12/15/2011  . Esophageal varices without bleeding (HCC) 12/15/2011  . Hepatic steatosis 12/14/2011  . Compression fracture of L1 lumbar vertebra (HCC) 12/14/2011  . Hypocalcemia 12/14/2011  . Acute renal failure (HCC) 12/14/2011  . Hypernatremia 12/14/2011  . Hypotension 12/14/2011  . Hyperglycemia 12/14/2011  . Alcoholic hepatitis 12/14/2011  . GI bleed 12/13/2011  . Acute blood loss anemia 12/13/2011  . Alcohol abuse 12/13/2011  . Jaundice 12/13/2011  . Coagulopathy (HCC) 12/13/2011  . Macrocytic anemia 12/13/2011  . Renal insufficiency 12/13/2011  . Hepatic encephalopathy (HCC) 12/13/2011  . Debility 12/13/2011  . Frequent falls 12/13/2011  . Pneumonia 08/17/2011  . HTN (hypertension) 08/17/2011  . COPD (chronic obstructive pulmonary disease) (HCC) 08/17/2011  . Smoker, current status unknown 08/17/2011    Orientation RESPIRATION BLADDER Height & Weight     Self, Time, Situation, Place  O2 (2.5L PRN) Incontinent Weight: 106 lb 1.6 oz (48.127 kg) Height:   (185.4 cm)  BEHAVIORAL SYMPTOMS/MOOD NEUROLOGICAL BOWEL NUTRITION STATUS  Other (Comment) (no behaviors noted.  Calm and cooperative.)   Continent Diet (normal diet)  AMBULATORY STATUS COMMUNICATION OF NEEDS Skin   Extensive Assist Verbally Surgical wounds                       Personal Care Assistance Level of Assistance  Bathing, Feeding, Dressing Bathing Assistance: Limited assistance Feeding assistance: Limited assistance Dressing Assistance: Limited assistance     Functional Limitations Info  Sight, Hearing, Speech Sight Info: Adequate Hearing Info: Adequate Speech Info: Adequate    SPECIAL CARE FACTORS FREQUENCY  PT (By licensed PT), OT (By licensed OT)     PT Frequency: 3 OT Frequency: 3            Contractures  Contractures Info: Not present    Additional Factors Info  Code Status, Allergies, Isolation Precautions, Psychotropic, Insulin Sliding Scale Code Status Info: DNR Allergies Info: NKA Psychotropic Info: Celexa: depression Insulin Sliding Scale Info: None Isolation Precautions Info: On contact: MRSA     Current Medications (10/11/2015):  This is the current hospital active medication list Current Facility-Administered Medications  Medication Dose Route Frequency Provider Last Rate Last Dose  . 0.9 %  sodium chloride infusion   Intravenous Continuous Meredeth IdeGagan S Lama, MD 10 mL/hr at 10/08/15 2300    . albumin human 25 % solution 25 g  25 g Intravenous BID Salomon MastBelayenh Befekadu, MD   25 g at 10/11/15 0833  . albuterol (PROVENTIL) (2.5 MG/3ML) 0.083% nebulizer solution 2.5 mg  2.5 mg Nebulization Q3H PRN Oval Linseyichard Dondiego, MD   2.5 mg at 10/10/15 1742  . citalopram (CELEXA) tablet 20 mg  20 mg Oral Daily Meredeth IdeGagan S Lama, MD   20 mg at 10/11/15 0833  . feeding supplement (ENSURE ENLIVE) (ENSURE ENLIVE) liquid 237 mL  237 mL Oral BID BM Meredeth IdeGagan S Lama, MD   237 mL at 10/10/15 1400  . furosemide (LASIX) 160 mg in dextrose 5 % 50 mL IVPB  160 mg Intravenous BID Salomon MastBelayenh Befekadu, MD   160 mg at 10/11/15 0833  . ipratropium-albuterol (DUONEB) 0.5-2.5 (3) MG/3ML nebulizer solution 3 mL  3 mL Nebulization Q6H WA Oval Linseyichard Dondiego, MD   3 mL at 10/11/15 1445  . LORazepam (ATIVAN) tablet 1 mg  1 mg Oral Q6H PRN Oval Linseyichard Dondiego, MD   1 mg at 10/11/15 [id number]  . naloxegol oxalate (MOVANTIK) tablet 25 mg  25 mg Oral Daily Oval Linseyichard Dondiego, MD   25 mg at 10/11/15 [id number]  . ondansetron (ZOFRAN) tablet 4 mg  4 mg Oral Q6H PRN Meredeth IdeGagan S Lama, MD       Or  . ondansetron (ZOFRAN) injection 4 mg  4 mg Intravenous Q6H PRN Meredeth IdeGagan S Lama, MD   4 mg at 10/09/15 1959  . oxyCODONE (Oxy IR/ROXICODONE) immediate release tablet 10 mg  10 mg Oral Q6H PRN Oval Linseyichard Dondiego, MD   10 mg at 10/11/15 1428  . pantoprazole (PROTONIX) EC tablet 40 mg   40 mg Oral BID Meredeth IdeGagan S Lama, MD   40 mg at 10/11/15 [id number]  . potassium chloride SA (K-DUR,KLOR-CON) CR tablet 40 mEq  40 mEq Oral BID Salomon MastBelayenh Befekadu, MD      . rifaximin [employer](XIFAXAN) tablet 550 mg  550 mg Oral BID Malissa HippoNajeeb U Rehman, MD   550 mg at 10/11/15 0833  . warfarin (COUMADIN) tablet 2 mg  2 mg Oral Once Oval Linseyichard Dondiego, MD      . Warfarin - Pharmacist Dosing Inpatient   Does not apply [id number] Oval Linseyichard Dondiego, MD         Discharge Medications: Please see discharge summary for a list of discharge medications.  Relevant Imaging Results:  Relevant Lab Results:   Additional Information    Raye SorrowCoble, Samanthia Howland N, LCSW

## 2015-10-11 NOTE — Progress Notes (Signed)
Pharmacy Antibiotic Note  Roberto [name] is a 70 y.o. male admitted on 10/08/2015 with MRSA bacteremia with a spinal abscess. Planned vancomycin through 11/02/2015. Pharmacy has been consulted for Vancomycin dosing. Patient has AKI on top of chronic renal insufficiency.  Vancomycin random level  3/10 at 1900 was 8630mcg/ml--> 3/11 at 100 was 6325mcg/ml--> 3/12 0600 was 1221mcg/ml. Estimated pharmacokinetic parameters are changing due fluctutation in renal fxn .  Ke=0.087, and t 1/2= 79  hours. Will recheck Vancomycin random level in 24 hours. Will redose when levels <3720mcg/ml. VT is 2417mcg/ml this AM.  Goal of therapy: Vanc trough 15-20 mcg/ml Plan: Vancomycin 1gm IV x 1 today. Check Vancomycin trough when appropriate F/u renal function and clinical course  Height: 6\' 1"  (185.4 cm) Weight: 106 lb 1.6 oz (48.127 kg) IBW/kg (Calculated) : 79.9  Temp (24hrs), Avg:98 F (36.7 C), Min:97.8 F (36.6 C), Max:98.2 F (36.8 C)   Recent Labs Lab 10/08/15 1915 10/09/15 0609  10/10/15 0559 10/11/15 0707  WBC 13.1* 12.4*  --   --  8.7  CREATININE 4.31* 4.20*  --  3.85*  3.78* 3.16*  VANCORANDOM 30  --   < > 21 17  < > = values in this interval not displayed.  Estimated Creatinine Clearance: 15.4 mL/min (by C-G formula based on Cr of 3.16).    No Known Allergies  Antimicrobials this admission: vanc 1/29 >> 2/2, 2/8 >> Zosyn 1/29 >> 2/1 ceftaroline 2/2>> 2/8  Dose adjustments this admission: 2/1 VT 26 on 1 gm q12, dose changed to 1250 q24 2/13 VT 23 on 750 mg Q12H SCr 1.43 2/17 VT 21 on 1250 mg every 24 hours SCR 1.87 2/20 VT 18 on 1gm q24hrs Scr 2.31  2/23 VT 26 on 1 gm q24, dose changed to 1500 q48h scr 2.34   Thank you for allowing pharmacy to be a part of this patient's care. Elder CyphersLorie Aidynn Krenn, BS Pharm D, [place] Clinical Pharmacist Pager [phone] 10/11/2015 8:17 AM

## 2015-10-11 NOTE — Progress Notes (Signed)
Patient's spirits are much improved today I refocused him that he is here for 2 reasons one is to complete antibiotics for 3 additional weeks and to fight for kidney function. Creatinine improved today over yesterday vancomycin continued as per pharmacy as his anticoagulation Honor JunesRonald J Atkins YQM:[id number] DOB: [birth year] DOA: 10/08/2015 PCP: Isabella StallingNDIEGO,Marybeth Dandy M, MD             Physical Exam: Blood pressure 111/63, pulse 85, temperature 97.8 F (36.6 C), temperature source Oral, resp. rate 20, height 6\' 1"  (1.854 m), weight 106 lb 1.6 oz (48.127 kg), SpO2 92 %. lungs show diminished breath sounds in the bases no rales wheeze rhonchi appreciable abdomen distended bowel sounds normoactive no guarding or rebound no masses no megaly heart regular rhythm no S3-S4 no heaves thrills rubs   Investigations:  Recent Results (from the past 240 hour(s))  MRSA PCR Screening     Status: None   Collection Time: 10/09/15  1:00 AM  Result Value Ref Range Status   MRSA by PCR NEGATIVE NEGATIVE Final    Comment:        The GeneXpert MRSA Assay (FDA approved for NASAL specimens only), is one component of a comprehensive MRSA colonization surveillance program. It is not intended to diagnose MRSA infection nor to guide or monitor treatment for MRSA infections.      Basic Metabolic Panel:  Recent Labs  52/84/1302/07/16 0559 10/11/15 0707  NA 137  136 138  K 4.2  4.1 3.4*  CL 102  102 100*  CO2 27  26 30   GLUCOSE 123*  121* 98  BUN 67*  67* 58*  CREATININE 3.85*  3.78* 3.16*  CALCIUM 8.3*  8.2* 8.2*  PHOS 4.9*  --    Liver Function Tests:  Recent Labs  10/10/15 0559 10/11/15 0707  AST 48* 59*  ALT 23 23  ALKPHOS 77 67  BILITOT 1.1 1.3*  PROT 5.9* 6.0*  ALBUMIN 2.3*  2.3* 2.6*     CBC:  Recent Labs  10/08/15 1915 10/09/15 0609 10/11/15 0707  WBC 13.1* 12.4* 8.7  NEUTROABS 8.1*  --   --   HGB 9.7* 8.9* 8.2*  HCT 28.1* 26.1* 24.2*  MCV 95.6 96.3 98.4  PLT 134* 87*  110*    No results found.    Medications:   Impression:  Active Problems:   Epidural abscess   DNR (do not resuscitate) discussion   Renal failure (ARF), acute on chronic (HCC)   AKI (acute kidney injury) (HCC)     Plan: Continue motivated 12.5 mg by mouth daily continue vancomycin and Coumadin as directed by pharmacy Ativan 1 mg every 6 hours by mouth when necessary  Consultants: Nephrology and gastroenterology   Procedures   Antibiotics: Vancomycin                  Code Status: No code   Family Communication:  Spoke with patient and son at length today  Disposition Plan   Time spent: 30 minutes   LOS: 3 days   Moyses Pavey M   10/11/2015, 12:09 PM

## 2015-10-11 NOTE — Progress Notes (Signed)
Subjective: Patient patient claims he is feeling slightly better. His appetite is still poor but he does not have any nausea or vomiting. He complains of also some difficulty breathing and wheezing. Still has some back pain.   Objective: Vital signs in last 24 hours: Temp:  [97.8 F (36.6 C)-98.2 F (36.8 C)] 97.8 F (36.6 C) (03/13 0547) Pulse Rate:  [85-93] 85 (03/13 0547) Resp:  [20-22] 20 (03/13 0547) BP: (110-124)/(62-71) 111/63 mmHg (03/13 0547) SpO2:  [88 %-99 %] 92 % (03/13 0843)  Intake/Output from previous day: 03/12 0701 - 03/13 0700 In: 3460 [P.O.:960; I.V.:550] Out: 4400 [Urine:4400] Intake/Output this shift:     Recent Labs  10/08/15 1915 10/09/15 0609 10/11/15 0707  HGB 9.7* 8.9* 8.2*    Recent Labs  10/09/15 0609 10/11/15 0707  WBC 12.4* 8.7  RBC 2.71* 2.46*  HCT 26.1* 24.2*  PLT 87* 110*    Recent Labs  10/10/15 0559 10/11/15 0707  NA 137  136 138  K 4.2  4.1 3.4*  CL 102  102 100*  CO2 27  26 30   BUN 67*  67* 58*  CREATININE 3.85*  3.78* 3.16*  GLUCOSE 123*  121* 98  CALCIUM 8.3*  8.2* 8.2*    Recent Labs  10/10/15 0559 10/11/15 0707  INR 2.54* 2.54*    Generally patient is alert and in no apparent distress Chest is clear to auscultation Heart exam revealed regular rate and rhythm no murmur Abdomen: Distended but nontender. Extremities: Patient with  3+plus edema  Assessment/Plan: Problem #1 acute kidney injury superimposed on chronic. Presently the etiology was thought to be secondary to ATN versus vancomycin-induced acute kidney injury. His renal function continued to improve. Problem #2 anasarca: Patient presently on iv Lasix and albumin. He had 4400 mL of urine output. Patient is still with significant sign of fluid overload. Problem #3 anemia: His hemoglobin is low. Stable Problem #4 liver cirrhosis Problem #5 history of epidural abscess: Status post drainage. Presently he is on antibiotics. Patient is afebrile but  his white blood cell count is high seems to be improving Problem #6 hypertension: His blood pressure is reasonably controlled Problem #7 history of COPD Problem #8 hypokalemia: Most likely from diuretics. Plan: We'll start patient on potassium 40 mEq by mouth twice a day We'll check renal panel in the morning.   Isaul Landi S 10/11/2015, 9:08 AM

## 2015-10-12 LAB — RENAL FUNCTION PANEL
Albumin: 2.9 g/dL — ABNORMAL LOW (ref 3.5–5.0)
Anion gap: 9 (ref 5–15)
BUN: 53 mg/dL — ABNORMAL HIGH (ref 6–20)
CHLORIDE: 98 mmol/L — AB (ref 101–111)
CO2: 31 mmol/L (ref 22–32)
CREATININE: 2.58 mg/dL — AB (ref 0.61–1.24)
Calcium: 8.2 mg/dL — ABNORMAL LOW (ref 8.9–10.3)
GFR, EST AFRICAN AMERICAN: 28 mL/min — AB (ref >60)
GFR, EST NON AFRICAN AMERICAN: 24 mL/min — AB (ref >60)
Glucose, Bld: 92 mg/dL (ref 65–99)
POTASSIUM: 3.7 mmol/L (ref 3.5–5.1)
Phosphorus: 2.8 mg/dL (ref 2.5–4.6)
Sodium: 138 mmol/L (ref 135–145)

## 2015-10-12 LAB — BASIC METABOLIC PANEL
ANION GAP: 8 (ref 5–15)
BUN: 52 mg/dL — ABNORMAL HIGH (ref 6–20)
CO2: 32 mmol/L (ref 22–32)
Calcium: 8.1 mg/dL — ABNORMAL LOW (ref 8.9–10.3)
Chloride: 98 mmol/L — ABNORMAL LOW (ref 101–111)
Creatinine, Ser: 2.6 mg/dL — ABNORMAL HIGH (ref 0.61–1.24)
GFR calc Af Amer: 28 mL/min — ABNORMAL LOW (ref >60)
GFR, EST NON AFRICAN AMERICAN: 24 mL/min — AB (ref >60)
Glucose, Bld: 93 mg/dL (ref 65–99)
POTASSIUM: 3.7 mmol/L (ref 3.5–5.1)
SODIUM: 138 mmol/L (ref 135–145)

## 2015-10-12 LAB — HEPATIC FUNCTION PANEL
ALBUMIN: 2.9 g/dL — AB (ref 3.5–5.0)
ALT: 24 U/L (ref 17–63)
AST: 59 U/L — AB (ref 15–41)
Alkaline Phosphatase: 65 U/L (ref 38–126)
Bilirubin, Direct: 0.3 mg/dL (ref 0.1–0.5)
Indirect Bilirubin: 1 mg/dL — ABNORMAL HIGH (ref 0.3–0.9)
TOTAL PROTEIN: 6 g/dL — AB (ref 6.5–8.1)
Total Bilirubin: 1.3 mg/dL — ABNORMAL HIGH (ref 0.3–1.2)

## 2015-10-12 LAB — PROTIME-INR
INR: 2.64 — AB (ref 0.00–1.49)
Prothrombin Time: 27.8 seconds — ABNORMAL HIGH (ref 11.6–15.2)

## 2015-10-12 MED ORDER — WARFARIN SODIUM 2 MG PO TABS
2.0000 mg | ORAL_TABLET | Freq: Once | ORAL | Status: AC
  Administered 2015-10-12: 2 mg via ORAL
  Filled 2015-10-12: qty 1

## 2015-10-12 MED ORDER — FUROSEMIDE 10 MG/ML IJ SOLN
80.0000 mg | Freq: Two times a day (BID) | INTRAMUSCULAR | Status: DC
  Administered 2015-10-13 (×2): 80 mg via INTRAVENOUS
  Filled 2015-10-12 (×4): qty 8

## 2015-10-12 MED ORDER — WARFARIN - PHARMACIST DOSING INPATIENT
Status: DC
  Administered 2015-10-13 – 2015-10-14 (×2)

## 2015-10-12 MED ORDER — POTASSIUM CHLORIDE CRYS ER 20 MEQ PO TBCR
40.0000 meq | EXTENDED_RELEASE_TABLET | Freq: Every day | ORAL | Status: DC
  Administered 2015-10-12 – 2015-10-14 (×3): 40 meq via ORAL
  Filled 2015-10-12 (×5): qty 2

## 2015-10-12 MED ORDER — NALOXEGOL OXALATE 12.5 MG PO TABS
12.5000 mg | ORAL_TABLET | Freq: Every day | ORAL | Status: DC
  Administered 2015-10-13 – 2015-10-15 (×3): 12.5 mg via ORAL
  Filled 2015-10-12 (×6): qty 1

## 2015-10-12 NOTE — Progress Notes (Signed)
Creatinine improved to 2.58 albumin of 2.6-2.9 hemoglobin stable on anticoagulation SCDs in place appreciate nephrology expertise Roberto [name] [id number] DOB: 08-10-1945 DOA: 10/08/2015 PCP: Roberto StallingNDIEGO,Roberto Rockman M, MD             Physical Exam: Blood pressure 102/50, pulse 93, temperature 99 F (37.2 C), temperature source Oral, resp. rate 16, height 6\' 1"  (1.854 [name]), weight 106 lb 1.6 oz (48.127 kg), SpO2 93 %. lungs T diminished breath sounds in both bases no rales wheeze rhonchi appreciable heart her rhythm no murmurs goes rubs abdomen distended soft nontender bowel sounds normoactive no peristaltic rushes no guarding or rebound   Investigations:  Recent Results (from the past 240 hour(s))  MRSA PCR Screening     Status: None   Collection Time: 10/09/15  1:00 AM  Result Value Ref Range Status   MRSA by PCR NEGATIVE NEGATIVE Final    Comment:        The GeneXpert MRSA Assay (FDA approved for NASAL specimens only), is one component of a comprehensive MRSA colonization surveillance program. It is not intended to diagnose MRSA infection nor to guide or monitor treatment for MRSA infections.      Basic Metabolic Panel:  Recent Labs  95/62/1302/07/16 0559 10/11/15 0707 10/12/15 0412  NA 137  136 138 138  138  K 4.2  4.1 3.4* 3.7  3.7  CL 102  102 100* 98*  98*  CO2 27  26 30 31   32  GLUCOSE 123*  121* 98 92  93  BUN 67*  67* 58* 53*  52*  CREATININE 3.85*  3.78* 3.16* 2.58*  2.60*  CALCIUM 8.3*  8.2* 8.2* 8.2*  8.1*  PHOS 4.9*  --  2.8   Liver Function Tests:  Recent Labs  10/11/15 0707 10/12/15 0412  AST 59* 59*  ALT 23 24  ALKPHOS 67 65  BILITOT 1.3* 1.3*  PROT 6.0* 6.0*  ALBUMIN 2.6* 2.9*  2.9*     CBC:  Recent Labs  10/11/15 0707  WBC 8.7  HGB 8.2*  HCT 24.2*  MCV 98.4  PLT 110*    No results found.    Medications:  Impression:  Active Problems:   Epidural abscess   DNR (do not resuscitate) discussion   Renal  failure (ARF), acute on chronic (HCC)   AKI (acute kidney injury) (HCC)     Plan: Continue aggressive fluid resuscitation and diuresis as per nephrology monitor renal function electrolytes continue vancomycin as per pharmacy consult   Consultants: Gastroenterology, nephrology, pharmacy   Procedures   Antibiotics: Vancomycin IV                  Code Status: No code   Family Communication:    Disposition Plan   Time spent: 30 minutes   LOS: 4 days   Roberto [name]   10/12/2015, 1:22 PM

## 2015-10-12 NOTE — Progress Notes (Signed)
Subjective: Patient is feeling much better. His appetite is good and he doesn't have any nausea or vomiting.   Objective: Vital signs in last 24 hours: Temp:  [98.3 F (36.8 C)-99 F (37.2 C)] 99 F (37.2 C) (03/14 0518) Pulse Rate:  [88-93] 88 (03/14 0518) Resp:  [20] 20 (03/14 0518) BP: (102-118)/(50-72) 102/50 mmHg (03/14 0518) SpO2:  [92 %-95 %] 94 % (03/14 0518)  Intake/Output from previous day: 03/13 0701 - 03/14 0700 In: 720 [P.O.:720] Out: 4300 [Urine:4300] Intake/Output this shift:     Recent Labs  10/11/15 0707  HGB 8.2*    Recent Labs  10/11/15 0707  WBC 8.7  RBC 2.46*  HCT 24.2*  PLT 110*    Recent Labs  10/11/15 0707 10/12/15 0412  NA 138 138  138  K 3.4* 3.7  3.7  CL 100* 98*  98*  CO2 30 31  32  BUN 58* 53*  52*  CREATININE 3.16* 2.58*  2.60*  GLUCOSE 98 92  93  CALCIUM 8.2* 8.2*  8.1*    Recent Labs  10/11/15 0707 10/12/15 0412  INR 2.54* 2.64*    Generally patient is alert and in no apparent distress Chest is clear to auscultation Heart exam revealed regular rate and rhythm no murmur Abdomen: Distended but nontender. Extremities: Patient with  3+plus edema  Assessment/Plan: Problem #1 acute kidney injury superimposed on chronic. Presently the etiology was thought to be secondary to ATN versus vancomycin-induced acute kidney injury. His BUN is 53 and creatinine is 2.6 has improved. Presently he doesn't have any uremic signs and symptoms. Problem #2 anasarca: Patient presently on iv Lasix . He had 4300 mL of urine output. Patient with anasarca and still significant fluid overload but seems to be improving. Problem #3 anemia: His hemoglobin is low. Stable Problem #4 liver cirrhosis Problem #5 history of epidural abscess: Status post drainage. Presently he is on antibiotics. Patient is afebrile but his white blood cell count is high seems to be improving Problem #6 hypertension: His blood pressure is reasonably  controlled Problem #7 history of COPD Problem #8 hypokalemia: Most likely from diuretics. His potassium has corrected. Presently he is on potassium supplement. Plan: 1] We'll decrease potassium to 40 mEq by mouth daily 2] patient advised to cut down his salt and fluid intake. 3] We'll check renal panel in the morning. 4] we'll decrease Lasix to 80 mg IV twice a day   Roberto [name] S 10/12/2015, 8:30 AM

## 2015-10-12 NOTE — Progress Notes (Signed)
ANTICOAGULATION CONSULT NOTE  Pharmacy Consult for Coumadin Indication: VTE prophylaxis  No Known Allergies  Patient Measurements: Height: 6\' 1"  (185.4 cm) Weight: 106 lb 1.6 oz (48.127 kg) IBW/kg (Calculated) : 79.9  Vital Signs: Temp: 99 F (37.2 C) (03/14 0518) Temp Source: Oral (03/14 0518) BP: 102/50 mmHg (03/14 0518) Pulse Rate: 88 (03/14 0518)  Labs:  Recent Labs  10/10/15 0559 10/11/15 0707 10/12/15 0412  HGB  --  8.2*  --   HCT  --  24.2*  --   PLT  --  110*  --   LABPROT 27.0* 27.0* 27.8*  INR 2.54* 2.54* 2.64*  CREATININE 3.85*  3.78* 3.16* 2.58*  2.60*   Medical History: Past Medical History  Diagnosis Date  . COPD (chronic obstructive pulmonary disease) (HCC)   . Hypertension   . Hepatic steatosis 12/14/2011  . Compression fracture of L1 lumbar vertebra (HCC) 12/14/2011  . C2 cervical fracture (HCC)   . Duodenal ulcer with hemorrhage 12/15/2011    Likely source of bleeding.per EGD; Dr. Karilyn Cotaehman  . Esophageal varices without bleeding (HCC) 12/15/2011    per EGD  . Cirrhosis (HCC) 12/16/2011    per ultrasound  . Hx of substance abuse 12/17/2011  . 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 Hepatic encephalopathy (HCC)   . Helicobacter pylori antibody positive 12/19/2011  . Encephalopathy 05/18/2012  . UTI (urinary tract infection) 12/13/2011  . Ascites 05/18/2012    S/p paracentesis yielding 7880 mL  . Cirrhosis (HCC)   . Depression    Medications:  Prescriptions prior to admission  Medication Sig Dispense Refill Last Dose  . albuterol (PROVENTIL) (2.5 MG/3ML) 0.083% nebulizer solution Take 3 mLs (2.5 mg total) by nebulization every 6 (six) hours as needed for wheezing or shortness of breath. 75 mL 12 unknown  . citalopram (CELEXA) 20 MG tablet Take 1 tablet (20 mg total) by mouth daily. 30 tablet 2 10/08/2015 at 800a  . feeding supplement, ENSURE ENLIVE, (ENSURE ENLIVE) LIQD Take 237 mLs by mouth 2 (two) times daily  between meals. 237 mL 12 10/09/2015 at Unknown time  . furosemide (LASIX) 20 MG tablet Take 2 tablets (40 mg total) by mouth daily. (Patient taking differently: Take 20 mg by mouth 2 (two) times daily. ) 60 tablet 1 10/08/2015 at 800a  . Heparin Lock Flush (HEPARIN NICU/SCN FLUSH) 1 UNIT/ML SOLN Inject 3 mLs into the vein daily.   10/08/2015 at 1100a  . magnesium oxide (MAG-OX) 400 (241.3 Mg) MG tablet Take 1 tablet (400 mg total) by mouth 2 (two) times daily. 30 tablet 3 10/08/2015 at 800a  . omeprazole (PRILOSEC) 20 MG capsule Take 20 mg by mouth daily.   10/08/2015 at 730a  . oxyCODONE-acetaminophen (PERCOCET) 7.5-325 MG tablet Take 1 tablet by mouth every 6 (six) hours as needed for moderate pain or severe pain.   10/08/2015 at 1125a  . pantoprazole (PROTONIX) 40 MG tablet Take 1 tablet (40 mg total) by mouth 2 (two) times daily. 60 tablet 3 Past Month at Unknown time  . potassium chloride SA (K-DUR,KLOR-CON) 20 MEQ tablet Take 2 tablets (40 mEq total) by mouth daily. 30 tablet 1 10/08/2015 at 800a  . spironolactone (ALDACTONE) 25 MG tablet Take 1 tablet (25 mg total) by mouth 2 (two) times daily. 60 tablet 3 10/08/2015 at 800a  . sucralfate (CARAFATE) 1 GM/10ML suspension Take 10 mLs (1 g total) by mouth 4 (four) times daily -  with meals and at bedtime. 420 mL 0  10/08/2015 at 1200  . carisoprodol (SOMA) 350 MG tablet Take 1 tablet (350 mg total) by mouth 3 (three) times daily as needed for muscle spasms. (Patient not taking: Reported on 10/08/2015) 30 tablet 0 unknown  . HYDROcodone-acetaminophen (NORCO/VICODIN) 5-325 MG tablet Take 1 tablet by mouth every 6 (six) hours as needed for severe pain. (Patient not taking: Reported on 10/08/2015) 6 tablet 0 09/15/2015 at Unknown time  . vancomycin 1,500 mg in sodium chloride 0.9 % 500 mL Inject 1,500 mg into the vein every other day. (Patient not taking: Reported on 10/08/2015) 1000 mL 3   . warfarin (COUMADIN) 2 MG tablet Take 1 tablet (2 mg total) by mouth once.  (Patient not taking: Reported on 10/08/2015) 30 tablet 1    Assessment: 70yo male.  Pharmacy consulted to manage Coumadin for VTE prophylaxis.  INR is therapeutic. No bleeding noted.  Goal of Therapy:  INR 2-3   Plan:  Coumadin 2 mg po today x 1 INR daily Monitor CBC, s/sx of bleeding complications  Mady Gemma, [facility]   10/12/2015,11:53 AM

## 2015-10-12 NOTE — Progress Notes (Signed)
  Subjective:  Patient states he is having adequate day. He has good appetite. He ate all his lunch. He denies abdominal pain.  Objective: Blood pressure 125/55, pulse 92, temperature 98.8 F (37.1 C), temperature source Oral, resp. rate 18, height 6\' 1"  (1.854 m), weight 227 lb 3.2 oz (103.057 kg), SpO2 98 %. Patient is alert and does not have asterixis. Has mild tremors to both hands. Abdomen is full with normal bowel sounds. It is soft and nontender. He remains with 2-3+ pitting edema involving both legs.  Urine output 4300 mL last 24 hours.  Labs/studies Results:   Recent Labs  10/11/15 0707  WBC 8.7  HGB 8.2*  HCT 24.2*  PLT 110*    BMET   Recent Labs  10/10/15 0559 10/11/15 0707 10/12/15 0412  NA 137  136 138 138  138  K 4.2  4.1 3.4* 3.7  3.7  CL 102  102 100* 98*  98*  CO2 27  26 30 31   32  GLUCOSE 123*  121* 98 92  93  BUN 67*  67* 58* 53*  52*  CREATININE 3.85*  3.78* 3.16* 2.58*  2.60*  CALCIUM 8.3*  8.2* 8.2* 8.2*  8.1*    LFT   Recent Labs  10/10/15 0559 10/11/15 0707 10/12/15 0412  PROT 5.9* 6.0* 6.0*  ALBUMIN 2.3*  2.3* 2.6* 2.9*  2.9*  AST 48* 59* 59*  ALT 23 23 24   ALKPHOS 77 67 65  BILITOT 1.1 1.3* 1.3*  BILIDIR 0.4 0.4 0.3  IBILI 0.7 0.9 1.0*    PT/INR   Recent Labs  10/11/15 0707 10/12/15 0412  LABPROT 27.0* 27.8*  INR 2.54* 2.64*    Serum ammonia was 54 on 10/10/2015  Assessment:  #1. Hepatic encephalopathy. Patient was begun on Xifaxan 2 days ago. His tremor has decreased. Etiology of his liver diseases alcoholic cirrhosis. #2. Acute on chronic kidney disease. Continued improvement in renal function. #3. Fluid overload is improving with excellent diureses. #4. Anemia of chronic disease. Patient is anticoagulated for DVT prophylaxis. H&H remained stable. #5. History of MRSA sepsis complicated by epidural abscess. Supposed thoracic laminectomy for drainage of epidural abscess 5 weeks ago. Patient has 3  more weeks of IV antibiotic.  Recommendations:  Check serum ammonia level in a.m.

## 2015-10-12 NOTE — Care Management Note (Signed)
Case Management Note  Patient Details  Name: Roberto [name] MRN: [id number] Date of Birth: [birth year]  Subjective/Objective:        [facility] will likely [facility] at [facility]. Patient being followed by CSW.            Action/Plan: Return to SNF.  Expected Discharge Date:                  Expected Discharge Plan:  Skilled Nursing Facility  In-House Referral:     Discharge planning Services  CM Consult  Post Acute Care Choice:  NA Choice offered to:  NA  DME Arranged:    DME Agency:     HH Arranged:    HH Agency:     Status of Service:  Completed, signed off  Medicare Important Message Given:    Date Medicare IM Given:    Medicare IM give by:    Date Additional Medicare IM Given:    Additional Medicare Important Message give by:     If discussed at Long Length of Stay Meetings, dates discussed:    Additional Comments:  Adonis HugueninBerkhead, Teona Vargus L, RN 10/12/2015, 11:24 AM

## 2015-10-12 NOTE — Progress Notes (Signed)
Loss of IV access. Dr. Janna Archondiego wants PICC line placed in the AM.

## 2015-10-13 LAB — VANCOMYCIN, RANDOM: Vancomycin Rm: 14 ug/mL

## 2015-10-13 LAB — RENAL FUNCTION PANEL
Albumin: 2.8 g/dL — ABNORMAL LOW (ref 3.5–5.0)
Anion gap: 8 (ref 5–15)
BUN: 43 mg/dL — ABNORMAL HIGH (ref 6–20)
CHLORIDE: 101 mmol/L (ref 101–111)
CO2: 31 mmol/L (ref 22–32)
CREATININE: 2.17 mg/dL — AB (ref 0.61–1.24)
Calcium: 8.2 mg/dL — ABNORMAL LOW (ref 8.9–10.3)
GFR calc non Af Amer: 30 mL/min — ABNORMAL LOW (ref >60)
GFR, EST AFRICAN AMERICAN: 34 mL/min — AB (ref >60)
Glucose, Bld: 102 mg/dL — ABNORMAL HIGH (ref 65–99)
Phosphorus: 2.7 mg/dL (ref 2.5–4.6)
Potassium: 4.1 mmol/L (ref 3.5–5.1)
Sodium: 140 mmol/L (ref 135–145)

## 2015-10-13 LAB — PROTIME-INR
INR: 2.33 — AB (ref 0.00–1.49)
PROTHROMBIN TIME: 25.4 s — AB (ref 11.6–15.2)

## 2015-10-13 MED ORDER — WARFARIN SODIUM 2 MG PO TABS
2.0000 mg | ORAL_TABLET | Freq: Once | ORAL | Status: AC
  Administered 2015-10-13: 2 mg via ORAL
  Filled 2015-10-13: qty 1

## 2015-10-13 MED ORDER — VANCOMYCIN HCL 10 G IV SOLR
1500.0000 mg | INTRAVENOUS | Status: DC
  Administered 2015-10-13 – 2015-10-15 (×2): 1500 mg via INTRAVENOUS
  Filled 2015-10-13 (×3): qty 1500

## 2015-10-13 NOTE — Progress Notes (Signed)
Renal function continues to improve creatinine down and can with current therapy and proteins and albumin no up to 2.8 improving currently anticoagulated continue current therapy Honor JunesRonald J Rudy ZOX:[id number] DOB: 1946/04/11 DOA: 10/08/2015 PCP: Isabella StallingNDIEGO,Braedyn Kauk M, MD             Physical Exam: Blood pressure 117/55, pulse 104, temperature 98.3 F (36.8 C), temperature source Oral, resp. rate 18, height 6\' 1"  (1.854 m), weight 227 lb 3.2 oz (103.057 kg), SpO2 90 %. lungs clear to A&P no rales wheeze rhonchi heart regular rhythm no murmurs gallops or rubs abdomen soft nontender bowel sounds normoactive   Investigations:  Recent Results (from the past 240 hour(s))  MRSA PCR Screening     Status: None   Collection Time: 10/09/15  1:00 AM  Result Value Ref Range Status   MRSA by PCR NEGATIVE NEGATIVE Final    Comment:        The GeneXpert MRSA Assay (FDA approved for NASAL specimens only), is one component of a comprehensive MRSA colonization surveillance program. It is not intended to diagnose MRSA infection nor to guide or monitor treatment for MRSA infections.      Basic Metabolic Panel:  Recent Labs  81/19/1403/14/17 0412 10/13/15 0502  NA 138  138 140  K 3.7  3.7 4.1  CL 98*  98* 101  CO2 31  32 31  GLUCOSE 92  93 102*  BUN 53*  52* 43*  CREATININE 2.58*  2.60* 2.17*  CALCIUM 8.2*  8.1* 8.2*  PHOS 2.8 2.7   Liver Function Tests:  Recent Labs  10/11/15 0707 10/12/15 0412 10/13/15 0502  AST 59* 59*  --   ALT 23 24  --   ALKPHOS 67 65  --   BILITOT 1.3* 1.3*  --   PROT 6.0* 6.0*  --   ALBUMIN 2.6* 2.9*  2.9* 2.8*     CBC:  Recent Labs  10/11/15 0707  WBC 8.7  HGB 8.2*  HCT 24.2*  MCV 98.4  PLT 110*    No results found.    Medications:   Impression:  Active Problems:   Epidural abscess   DNR (do not resuscitate) discussion   Renal failure (ARF), acute on chronic (HCC)   AKI (acute kidney injury) (HCC)     Plan: Continue  diuresis monitor renal function continue anticoagulation continue physical therapy  Consultants: Pharmacy gastroenterology nephrology   Procedures  Antibiotics: IV vancomycin                  Code Status: No code  Family Communication:  [place] with son and daughter-in-law yesterday as well as patient  Disposition Plan  Time spent: 30 minutes   LOS: 5 days   Burhanuddin Kohlmann M   10/13/2015, 12:05 PM

## 2015-10-13 NOTE — Progress Notes (Signed)
Pharmacy Antibiotic Note  Roberto [name] is a 70 y.o. male admitted on 10/08/2015 with MRSA bacteremia with a spinal abscess. Planned vancomycin through 11/02/2015. Pharmacy has been consulted for Vancomycin dosing. Patient has AKI on top of chronic renal insufficiency.  Vancomycin random level  3/10 at 1900 was 3230mcg/ml--> 3/11 at 100 was 4025mcg/ml--> 3/12 0600 was 4021mcg/ml, 3/15 0500 = 14. Estimated pharmacokinetic parameters are changing due fluctutation in renal fxn .  SCr is improving.    Goal of therapy: Vanc trough 15-20 mcg/ml Plan: Vancomycin 1500 mg IV q48hrs Check Vancomycin level is SCr gets worse or as warranted Monitor renal function and clinical course  Height: 6\' 1"  (185.4 cm) Weight: 227 lb 3.2 oz (103.057 kg) IBW/kg (Calculated) : 79.9  Temp (24hrs), Avg:98.4 F (36.9 C), Min:98.2 F (36.8 C), Max:98.8 F (37.1 C)   Recent Labs Lab 10/08/15 1915 10/09/15 0609  10/10/15 0559 10/11/15 0707 10/12/15 0412 10/13/15 0502  WBC 13.1* 12.4*  --   --  8.7  --   --   CREATININE 4.31* 4.20*  --  3.85*  3.78* 3.16* 2.58*  2.60* 2.17*  VANCORANDOM 30  --   < > 21 17  --  14  < > = values in this interval not displayed.  Estimated Creatinine Clearance: 41.7 mL/min (by C-G formula based on Cr of 2.17).    No Known Allergies  Antimicrobials this admission: vanc 1/29 >> 2/2, 2/8 >> Zosyn 1/29 >> 2/1 ceftaroline 2/2>> 2/8  Dose adjustments this admission: 2/1 VT 26 on 1 gm q12, dose changed to 1250 q24 2/13 VT 23 on 750 mg Q12H SCr 1.43 2/17 VT 21 on 1250 mg every 24 hours SCR 1.87 2/20 VT 18 on 1gm q24hrs Scr 2.31  2/23 VT 26 on 1 gm q24, dose changed to 1500 q48h scr 2.34 3/15 random level = 14, Vanc 1500mg  IV q48hr resumed  Thank you for allowing pharmacy to be a part of this patient's care.  Valrie HartScott Garvis Downum, [place] Clinical Pharmacist 10/13/2015 8:17 AM

## 2015-10-13 NOTE — Progress Notes (Signed)
Honor JunesRonald J Olvey  MRN: [id number]  DOB/AGE: 1945/10/07 70 y.o.  Primary Care Physician:DONDIEGO,RICHARD M, MD  Admit date: 10/08/2015  Chief Complaint:  Chief Complaint  Patient presents with  . Abnormal Lab    S-Pt presented on  10/08/2015 with  Chief Complaint  Patient presents with  . Abnormal Lab  .    Pt son main concern was " I want to meet Dr Karilyn Cotaehman"   Pt himself feels better than before.     Meds . citalopram  20 mg Oral Daily  . feeding supplement (ENSURE ENLIVE)  237 mL Oral BID BM  . furosemide  80 mg Intravenous BID  . ipratropium-albuterol  3 mL Nebulization Q6H WA  . naloxegol oxalate  12.5 mg Oral Daily  . pantoprazole  40 mg Oral BID  . potassium chloride  40 mEq Oral Daily  . rifaximin  550 mg Oral BID  . vancomycin  1,500 mg Intravenous Q48H  . warfarin  2 mg Oral Once  . Warfarin - Pharmacist Dosing Inpatient   Does not apply Q24H     Physical Exam: Vital signs in last 24 hours: Temp:  [98.2 F (36.8 C)-98.8 F (37.1 C)] 98.3 F (36.8 C) (03/15 0502) Pulse Rate:  [87-104] 104 (03/15 0502) Resp:  [16-18] 18 (03/15 0502) BP: (110-125)/(55-62) 117/55 mmHg (03/15 0502) SpO2:  [87 %-98 %] 90 % (03/15 0723) Weight:  [227 lb 3.2 oz (103.057 kg)] 227 lb 3.2 oz (103.057 kg) (03/14 1400) Weight change:  Last BM Date: 10/12/15  Intake/Output from previous day: 03/14 0701 - 03/15 0700 In: 720 [P.O.:720] Out: 800 [Urine:800] Total I/O In: 240 [P.O.:240] Out: -    Physical Exam: General- pt is awake,alert,follows commands  Resp- No acute REsp distress, decreased bs at bases. CVS- S1S2 regular in rate and rhythm GIT- BS+, soft, NT, Distended+  EXT-2+ LE Edema( better than before) , NO Cyanosis   Lab Results: CBC  Recent Labs  10/11/15 0707  WBC 8.7  HGB 8.2*  HCT 24.2*  PLT 110*    BMET  Recent Labs  10/12/15 0412 10/13/15 0502  NA 138  138 140  K 3.7  3.7 4.1  CL 98*  98* 101  CO2 31  32 31  GLUCOSE 92  93 102*  BUN 53*   52* 43*  CREATININE 2.58*  2.60* 2.17*  CALCIUM 8.2*  8.1* 8.2*   Creat trend 2017   March  4.2=>2.58=>2.1  Jan/Feb 1.5--2.6   MICRO Recent Results (from the past 240 hour(s))  MRSA PCR Screening     Status: None   Collection Time: 10/09/15  1:00 AM  Result Value Ref Range Status   MRSA by PCR NEGATIVE NEGATIVE Final    Comment:        The GeneXpert MRSA Assay (FDA approved for NASAL specimens only), is one component of a comprehensive MRSA colonization surveillance program. It is not intended to diagnose MRSA infection nor to guide or monitor treatment for MRSA infections.       Lab Results  Component Value Date   CALCIUM 8.2* 10/13/2015   CAION 1.00* 09/07/2015   PHOS 2.7 10/13/2015      Impression: 1)Renal  AKI secondary to Mutliple factors                AKI sec to   Prerenal/ATN/IV Vanco                AKI now better  Creat trending down               2)CVs-hemodynamically stable Medication- On Diuretics  3)Anemia HGb low but stable   4)ID-hx of Epidural abscess On IV vanco  5)Liver- hx of Cirrhosis GI and Primary MD following  6)Electrolytes  Normokalemic Normonatremic   7)Acid base Co2 at goal     Plan:  Will continue current care     Aadan Chenier S 10/13/2015, 12:22 PM

## 2015-10-13 NOTE — Plan of Care (Signed)
Problem: Safety: Goal: Ability to remain free from injury will improve Outcome: Not Progressing Pt continues to pull at tubes and lines (iv). Pt pulled out iv. MD aware and states pt will have PICC line placed tomorrow. Pt has been trying to get out of bed and has been more confused today.   Problem: Pain Managment: Goal: General experience of comfort will improve Outcome: Progressing Pt has not complained of pain this shift.

## 2015-10-13 NOTE — Progress Notes (Signed)
ANTICOAGULATION CONSULT NOTE  Pharmacy Consult for Coumadin Indication: VTE prophylaxis  No Known Allergies  Patient Measurements: Height:  (185.4 cm) Weight: 227 lb 3.2 oz (103.057 kg) IBW/kg (Calculated) : 79.9  Vital Signs: Temp: 98.3 F (36.8 C) (03/15 0502) Temp Source: Oral (03/15 0502) BP: 117/55 mmHg (03/15 0502) Pulse Rate: 104 (03/15 0502)  Labs:  Recent Labs  10/11/15 0707 10/12/15 0412 10/13/15 0502  HGB 8.2*  --   --   HCT 24.2*  --   --   PLT 110*  --   --   LABPROT 27.0* 27.8* 25.4*  INR 2.54* 2.64* 2.33*  CREATININE 3.16* 2.58*  2.60* 2.17*   Medical History: Past Medical History  Diagnosis Date  . COPD (chronic obstructive pulmonary disease) (HCC)   . Hypertension   . Hepatic steatosis 12/14/2011  . Compression fracture of L1 lumbar vertebra (HCC) 12/14/2011  . C2 cervical fracture (HCC)   . Duodenal ulcer with hemorrhage 12/15/2011    Likely source of bleeding.per EGD; Dr. Karilyn Cota  . Esophageal varices without bleeding (HCC) 12/15/2011    per EGD  . Cirrhosis (HCC) 12/16/2011    per ultrasound  . Hx of substance abuse 12/17/2011  . 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 Hepatic encephalopathy (HCC)   . Helicobacter pylori antibody positive 12/19/2011  . Encephalopathy 05/18/2012  . UTI (urinary tract infection) 12/13/2011  . Ascites 05/18/2012    S/p paracentesis yielding 7880 mL  . Cirrhosis (HCC)   . Depression    Medications:  Prescriptions prior to admission  Medication Sig Dispense Refill Last Dose  . albuterol (PROVENTIL) (2.5 MG/3ML) 0.083% nebulizer solution Take 3 mLs (2.5 mg total) by nebulization every 6 (six) hours as needed for wheezing or shortness of breath. 75 mL 12 unknown  . citalopram (CELEXA) 20 MG tablet Take 1 tablet (20 mg total) by mouth daily. 30 tablet 2 10/08/2015 at 800a  . feeding supplement, ENSURE ENLIVE, (ENSURE ENLIVE) LIQD Take 237 mLs by mouth 2 (two) times daily  between meals. 237 mL 12 10/09/2015 at Unknown time  . furosemide (LASIX) 20 MG tablet Take 2 tablets (40 mg total) by mouth daily. (Patient taking differently: Take 20 mg by mouth 2 (two) times daily. ) 60 tablet 1 10/08/2015 at 800a  . Heparin Lock Flush (HEPARIN NICU/SCN FLUSH) 1 UNIT/ML SOLN Inject 3 mLs into the vein daily.   10/08/2015 at 1100a  . magnesium oxide (MAG-OX) 400 (241.3 Mg) MG tablet Take 1 tablet (400 mg total) by mouth 2 (two) times daily. 30 tablet 3 10/08/2015 at 800a  . omeprazole (PRILOSEC) 20 MG capsule Take 20 mg by mouth daily.   10/08/2015 at 730a  . oxyCODONE-acetaminophen (PERCOCET) 7.5-325 MG tablet Take 1 tablet by mouth every 6 (six) hours as needed for moderate pain or severe pain.   10/08/2015 at 1125a  . pantoprazole (PROTONIX) 40 MG tablet Take 1 tablet (40 mg total) by mouth 2 (two) times daily. 60 tablet 3 Past Month at Unknown time  . potassium chloride SA (K-DUR,KLOR-CON) 20 MEQ tablet Take 2 tablets (40 mEq total) by mouth daily. 30 tablet 1 10/08/2015 at 800a  . spironolactone (ALDACTONE) 25 MG tablet Take 1 tablet (25 mg total) by mouth 2 (two) times daily. 60 tablet 3 10/08/2015 at 800a  . sucralfate (CARAFATE) 1 GM/10ML suspension Take 10 mLs (1 g total) by mouth 4 (four) times daily -  with meals and at bedtime. 420 mL 0 10/08/2015 at  1200  . carisoprodol (SOMA) 350 MG tablet Take 1 tablet (350 mg total) by mouth 3 (three) times daily as needed for muscle spasms. (Patient not taking: Reported on 10/08/2015) 30 tablet 0 unknown  . HYDROcodone-acetaminophen (NORCO/VICODIN) 5-325 MG tablet Take 1 tablet by mouth every 6 (six) hours as needed for severe pain. (Patient not taking: Reported on 10/08/2015) 6 tablet 0 09/15/2015 at Unknown time  . vancomycin 1,500 mg in sodium chloride 0.9 % 500 mL Inject 1,500 mg into the vein every other day. (Patient not taking: Reported on 10/08/2015) 1000 mL 3   . warfarin (COUMADIN) 2 MG tablet Take 1 tablet (2 mg total) by mouth once.  (Patient not taking: Reported on 10/08/2015) 30 tablet 1    Assessment: 70yo male.  Pharmacy consulted to manage Coumadin for VTE prophylaxis.  INR is therapeutic. No bleeding noted.  Goal of Therapy:  INR 2-3   Plan:  Coumadin 2 mg po today x 1 INR daily Monitor CBC, s/sx of bleeding complications  Valrie HartHall, Carrie Usery A, RPH   10/13/2015,8:10 AM

## 2015-10-13 NOTE — Progress Notes (Signed)
As per Dr. Janna ArchonDiego, Order placed and call made to [facility] for placement of PICC line in order for patient to receive  3 weeks ABT.  Patient is A/O, Informed consent completed. Call placed at [phone], spoke with Ginger.  Awaiting Appt time. Will continue to monitor

## 2015-10-14 LAB — PROTIME-INR
INR: 2.12 — ABNORMAL HIGH (ref 0.00–1.49)
PROTHROMBIN TIME: 23.5 s — AB (ref 11.6–15.2)

## 2015-10-14 LAB — CBC
HCT: 24.7 % — ABNORMAL LOW (ref 39.0–52.0)
HEMOGLOBIN: 8.2 g/dL — AB (ref 13.0–17.0)
MCH: 33.3 pg (ref 26.0–34.0)
MCHC: 33.2 g/dL (ref 30.0–36.0)
MCV: 100.4 fL — AB (ref 78.0–100.0)
PLATELETS: 133 10*3/uL — AB (ref 150–400)
RBC: 2.46 MIL/uL — ABNORMAL LOW (ref 4.22–5.81)
RDW: 20.7 % — ABNORMAL HIGH (ref 11.5–15.5)
WBC: 13.7 10*3/uL — ABNORMAL HIGH (ref 4.0–10.5)

## 2015-10-14 MED ORDER — METOLAZONE 5 MG PO TABS
5.0000 mg | ORAL_TABLET | Freq: Two times a day (BID) | ORAL | Status: DC
  Administered 2015-10-14 (×2): 5 mg via ORAL
  Filled 2015-10-14 (×3): qty 1

## 2015-10-14 MED ORDER — WARFARIN SODIUM 2 MG PO TABS
2.0000 mg | ORAL_TABLET | Freq: Once | ORAL | Status: AC
  Administered 2015-10-14: 2 mg via ORAL
  Filled 2015-10-14: qty 1

## 2015-10-14 MED ORDER — SUCRALFATE 1 GM/10ML PO SUSP
1.0000 g | Freq: Three times a day (TID) | ORAL | Status: DC
  Administered 2015-10-14 – 2015-10-15 (×2): 1 g via ORAL
  Filled 2015-10-14 (×3): qty 10

## 2015-10-14 MED ORDER — TORSEMIDE 20 MG PO TABS
60.0000 mg | ORAL_TABLET | Freq: Every day | ORAL | Status: DC
  Administered 2015-10-14 – 2015-10-15 (×2): 60 mg via ORAL
  Filled 2015-10-14 (×3): qty 3

## 2015-10-14 NOTE — Progress Notes (Signed)
  Subjective:  Patient complains of heartburn. He denies nausea or vomiting. He continues to complain of pain across upper abdomen. He states his appetite is good.  Objective: Blood pressure 103/62, pulse 108, temperature 98.2 F (36.8 C), temperature source Oral, resp. rate 21, height 6\' 1"  (1.854 m), weight 227 lb 3.2 oz (103.057 kg), SpO2 100 %. Patient is alert and does not have asterixis. He has fine tremors to outstretched hands. Abdomen is full and soft without tenderness organomegaly or masses. LE edema is 3+. Pedal edema seemed to have increased since last seen 2 days ago.  Labs/studies Results:   Recent Labs  10/14/15 0617  WBC 13.7*  HGB 8.2*  HCT 24.7*  PLT 133*    BMET   Recent Labs  10/12/15 0412 10/13/15 0502  NA 138  138 140  K 3.7  3.7 4.1  CL 98*  98* 101  CO2 31  32 31  GLUCOSE 92  93 102*  BUN 53*  52* 43*  CREATININE 2.58*  2.60* 2.17*  CALCIUM 8.2*  8.1* 8.2*    LFT   Recent Labs  10/12/15 0412 10/13/15 0502  PROT 6.0*  --   ALBUMIN 2.9*  2.9* 2.8*  AST 59*  --   ALT 24  --   ALKPHOS 65  --   BILITOT 1.3*  --   BILIDIR 0.3  --   IBILI 1.0*  --     PT/INR   Recent Labs  10/13/15 0502 10/14/15 0617  LABPROT 25.4* 23.5*  INR 2.33* 2.12*      Assessment:  #1. Hepatic encephalopathy. Clinically he appears to be doing better with Xifaxan. Lactulose had to be discontinued because of colonic ileus and abdominal distention during last admission. #2. GERD. He possibly has an element of gastroparesis secondary to pain medication. He was not complaining of heartburn while he was in sucralfate. I stopped this medication 3 days ago. #3. Anasarca. Continued improvement in fluid status. Lower extremity edema is resolving very slowly. Unfortunately paraplegia making this worse. #4. Acute on chronic renal failure. Continued improvement in renal function. #5. 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 Hypoalbuminemia secondary to chronic liver disease contribution to his anasarca. Serum albumin has come with IV albumin.  Recommendations:  Will restart sucralfate at a dose of 1 g by mouth before meals and daily at bedtime. Serum ammonia with a.m. lab.

## 2015-10-14 NOTE — Progress Notes (Signed)
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  brought to the hospital from skilled facility for abnormal labs as is BUN/creatinine now elevated to 67/4.31  Patient today states his appetite is unchanged. His weight measurements are too inaccurate/variable to determine trend  Current diet order is heart healthy, patient is consistently eating >50% of his meals.   No nutrition interventions warranted at this time. If nutrition issues arise, please consult RD.   Christophe LouisNathan Deztiny Sarra RD, LDN Nutrition Pager: [phone] 10/14/2015 1:34 PM

## 2015-10-14 NOTE — Progress Notes (Signed)
Pt found to have pulled out right arm PICC line. Pt is confused and had PICC line in hands with intact catheter. Insertion length 47cm...catheter length noted to be 47cm also. No bleeding to PICC site...gauze and tegaderm applied. Pt con't to have right arm peripheral IV. Notified Dr. Selena BattenKim.

## 2015-10-14 NOTE — Progress Notes (Signed)
ANTICOAGULATION CONSULT NOTE  Pharmacy Consult for Coumadin Indication: VTE prophylaxis  No Known Allergies  Patient Measurements: Height: 6\' 1"  (185.4 cm) Weight: 227 lb 3.2 oz (103.057 kg) IBW/kg (Calculated) : 79.9  Vital Signs: Temp: 98.1 F (36.7 C) (03/15 2122) Temp Source: Oral (03/15 2122) BP: 118/68 mmHg (03/15 2122) Pulse Rate: 92 (03/15 2122)  Labs:  Recent Labs  10/12/15 0412 10/13/15 0502 10/14/15 0617  HGB  --   --  8.2*  HCT  --   --  24.7*  PLT  --   --  133*  LABPROT 27.8* 25.4* 23.5*  INR 2.64* 2.33* 2.12*  CREATININE 2.58*  2.60* 2.17*  --    Medical History: Past Medical History  Diagnosis Date  . COPD (chronic obstructive pulmonary disease) (HCC)   . Hypertension   . Hepatic steatosis 12/14/2011  . Compression fracture of L1 lumbar vertebra (HCC) 12/14/2011  . C2 cervical fracture (HCC)   . Duodenal ulcer with hemorrhage 12/15/2011    Likely source of bleeding.per EGD; Dr. Karilyn Cotaehman  . Esophageal varices without bleeding (HCC) 12/15/2011    per EGD  . Cirrhosis (HCC) 12/16/2011    per ultrasound  . Hx of substance abuse 12/17/2011  . 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 Hepatic encephalopathy (HCC)   . Helicobacter pylori antibody positive 12/19/2011  . Encephalopathy 05/18/2012  . UTI (urinary tract infection) 12/13/2011  . Ascites 05/18/2012    S/p paracentesis yielding 7880 mL  . Cirrhosis (HCC)   . Depression    Medications:  Prescriptions prior to admission  Medication Sig Dispense Refill Last Dose  . albuterol (PROVENTIL) (2.5 MG/3ML) 0.083% nebulizer solution Take 3 mLs (2.5 mg total) by nebulization every 6 (six) hours as needed for wheezing or shortness of breath. 75 mL 12 unknown  . citalopram (CELEXA) 20 MG tablet Take 1 tablet (20 mg total) by mouth daily. 30 tablet 2 10/08/2015 at 800a  . feeding supplement, ENSURE ENLIVE, (ENSURE ENLIVE) LIQD Take 237 mLs by mouth 2 (two) times daily  between meals. 237 mL 12 10/09/2015 at Unknown time  . furosemide (LASIX) 20 MG tablet Take 2 tablets (40 mg total) by mouth daily. (Patient taking differently: Take 20 mg by mouth 2 (two) times daily. ) 60 tablet 1 10/08/2015 at 800a  . Heparin Lock Flush (HEPARIN NICU/SCN FLUSH) 1 UNIT/ML SOLN Inject 3 mLs into the vein daily.   10/08/2015 at 1100a  . magnesium oxide (MAG-OX) 400 (241.3 Mg) MG tablet Take 1 tablet (400 mg total) by mouth 2 (two) times daily. 30 tablet 3 10/08/2015 at 800a  . omeprazole (PRILOSEC) 20 MG capsule Take 20 mg by mouth daily.   10/08/2015 at 730a  . oxyCODONE-acetaminophen (PERCOCET) 7.5-325 MG tablet Take 1 tablet by mouth every 6 (six) hours as needed for moderate pain or severe pain.   10/08/2015 at 1125a  . pantoprazole (PROTONIX) 40 MG tablet Take 1 tablet (40 mg total) by mouth 2 (two) times daily. 60 tablet 3 Past Month at Unknown time  . potassium chloride SA (K-DUR,KLOR-CON) 20 MEQ tablet Take 2 tablets (40 mEq total) by mouth daily. 30 tablet 1 10/08/2015 at 800a  . spironolactone (ALDACTONE) 25 MG tablet Take 1 tablet (25 mg total) by mouth 2 (two) times daily. 60 tablet 3 10/08/2015 at 800a  . sucralfate (CARAFATE) 1 GM/10ML suspension Take 10 mLs (1 g total) by mouth 4 (four) times daily -  with meals and at bedtime. 420 mL 0  10/08/2015 at 1200  . carisoprodol (SOMA) 350 MG tablet Take 1 tablet (350 mg total) by mouth 3 (three) times daily as needed for muscle spasms. (Patient not taking: Reported on 10/08/2015) 30 tablet 0 unknown  . HYDROcodone-acetaminophen (NORCO/VICODIN) 5-325 MG tablet Take 1 tablet by mouth every 6 (six) hours as needed for severe pain. (Patient not taking: Reported on 10/08/2015) 6 tablet 0 09/15/2015 at Unknown time  . vancomycin 1,500 mg in sodium chloride 0.9 % 500 mL Inject 1,500 mg into the vein every other day. (Patient not taking: Reported on 10/08/2015) 1000 mL 3   . warfarin (COUMADIN) 2 MG tablet Take 1 tablet (2 mg total) by mouth once.  (Patient not taking: Reported on 10/08/2015) 30 tablet 1    Assessment: 70yo male.  Pharmacy consulted to manage Coumadin for VTE prophylaxis.  INR is therapeutic. No bleeding noted.  Goal of Therapy:  INR 2-3   Plan:  Coumadin 2 mg po today  INR daily Monitor CBC, s/sx of bleeding complications  Valrie Hart A, RPH   10/14/2015,7:53 AM

## 2015-10-14 NOTE — Progress Notes (Signed)
Present with patient for emotional and spiritual support. He was expressive about his health changes and became tearful as he talked about his thoughts about the possibility of losing his leg. Addressed the spiritual distress around loss and grief. Prayed with him also.

## 2015-10-14 NOTE — Evaluation (Signed)
Physical Therapy Evaluation Patient Details Name: Roberto [name] MRN: [id number] DOB: 1945-12-18 Today's Date: 10/14/2015   History of Present Illness  Roberto [name] is an 70 y.o. male recently discharge from [facility] for Epidural abscess <-- MRSA bacteremia <-- septic right knee joint, IV Vancomycin throughou April,  admitted to the ED c ABD distention, nausea, and vomiting. He underwent a T5-T11 laminectomy on 2/7. Pt back t [facility] 2 weeks ago, DC to [facility], and now returns again on 3/10 . Pt has not yet had a chance to start with PT at SNF. This visit, bilat LEE has been a major concern. Pt expected to return to [facility] once medically stable.   Clinical Impression  At evaluation, pt is received semirecumbent in bed upon entry, no family/caregiver present. Evidence attempts to float heels noted, however, worsening LEE resultant in heels in contact with bed: discussed with RN and repositioned to assure >4 inches clearance. The pt is awake and agreeable to participate. No acute distress noted at this time. The pt is alert and oriented x3, pleasant, conversational, and following simple commands consistently, but requires additional time to respond to questions. He says his memory is off and asks if that is normal. Pt grip strength is strong and symmetrical; BUE strength as screened during functional mobility assessment presents with minimal impairment, still able to assist with rolling scooting in bed with BUE on bed rails, but requiring maxA. BLE function is unchanged since last visit with continued hemiplegia in BLE and decreased sensation from the upper thigh down. Skin integrity is substantially compromised at Bilat feet (see details below) with noted maceration, skin tears, and stage one pressure ulcer.    Patient presenting with impairment of strength, range of motion, balnce, and activity tolerance, limiting ability to perform ADL and mobility tasks at  baseline level of function. Patient  will benefit from skilled intervention to address the above impairments and limitations, in order to restore to prior level of function, improve patient safety upon discharge, and to decrease falls risk.       Follow Up Recommendations SNF (return to SNF for rehab)    Equipment Recommendations  None recommended by PT    Recommendations for Other Services       Precautions / Restrictions Precautions Precautions: Fall Restrictions Weight Bearing Restrictions: No Other Position/Activity Restrictions: Pt now with BLE wounds on feet, with limited to poor sensation; not approrpiate for WB at this time.       Mobility  Bed Mobility Overal bed mobility: + 2 for safety/equipment Bed Mobility: Rolling Rolling: Max assist         General bed mobility comments: assistas with bed rails, requires help with trunk due to edematous paraplegia BLE.   Transfers                    Ambulation/Gait                Stairs            Wheelchair Mobility    Modified Rankin (Stroke Patients Only)       Balance                                             Pertinent Vitals/Pain Pain Assessment: No/denies pain    Home Living Family/patient expects to be discharged to:: Skilled  nursing facility                 Additional Comments: Not started PT yet.     Prior Function Level of Independence: Independent         Comments: Reports he was not driving, but he would walk to the convenient store down the road. No AD 3 months ago.      Hand Dominance   Dominant Hand: Right    Extremity/Trunk Assessment               Lower Extremity Assessment: RLE deficits/detail;LLE deficits/detail RLE Deficits / Details: Stage 3 pitting edema from mid thigh to feet which are very edematous; large skin tear at heel with adjacent skin macerated and weeping. (not successfully floated upon entry).  LLE Deficits / Details: Stage 3 pitting edema from  mid thigh to feet which are very edematous. Macerated stage I pressure ulcer at the L heel (not successfully floated upon entry).      Communication   Communication: No difficulties  Cognition Arousal/Alertness: Awake/alert Behavior During Therapy: WFL for tasks assessed/performed Overall Cognitive Status: No family/caregiver present to determine baseline cognitive functioning                      General Comments      Exercises        Assessment/Plan    PT Assessment Patient needs continued PT services  PT Diagnosis Generalized weakness;Acute pain;Difficulty walking;Altered mental status   PT Problem List Decreased strength;Decreased mobility;Decreased range of motion;Decreased activity tolerance;Decreased coordination;Pain;Impaired sensation;Decreased skin integrity  PT Treatment Interventions Therapeutic exercise;Therapeutic activities;Functional mobility training;Balance training;Patient/family education;Manual techniques;Wheelchair mobility training   PT Goals (Current goals can be found in the Care Plan section) Acute Rehab PT Goals Patient Stated Goal: To regain some independence and have less pain.  PT Goal Formulation: With patient Time For Goal Achievement: 10/26/15 Potential to Achieve Goals: Fair    Frequency Min 3X/week   Barriers to discharge Decreased caregiver support      Co-evaluation               End of Session   Activity Tolerance: Patient tolerated treatment well Patient left: in bed;with call bell/phone within reach;with nursing/sitter in room Nurse Communication: Mobility status;Other (comment) (recommending SCDs that extend to the upper thigh; asked that NT check heels floating q 2.  )         Time: [id number] PT Time Calculation (min) (ACUTE ONLY): 27 min   Charges:   PT Evaluation $PT Eval High Complexity: 1 Procedure PT Treatments $Therapeutic Activity: 23-37 mins   PT G Codes:        3:18 PM, 10/14/2015 Roberto [name], PT, DPT PRN [facility] License # [id number] [phone] (wireless)  [phone] (mobile)

## 2015-10-14 NOTE — Progress Notes (Signed)
Patient seems anxious tense today is 4+ pitting edema SCDs were ordered not in place currently not certain why but nursing placed him today to mobilize third space fluids should on Demadex 60 orally per nephrology creatinine down to 2.87 we'll await be met in a.m. with third space mobilization and oral diuresis Roberto [name] SFK:[id number] DOB: Mar 19, 1946 DOA: 10/08/2015 PCP: Roberto Curet, MD             Physical Exam: Blood pressure 118/68, pulse 92, temperature 98.1 F (36.7 C), temperature source Oral, resp. rate 20, height '6\' 1"'$  (1.854 m), weight 227 lb 3.2 oz (103.057 kg), SpO2 95 %. lungs diminished breath sounds in the bases no rales wheeze rhonchi appreciable heart regular rhythm no S3 or S4 no heaves thrills rubs abdomen bowel sounds normoactive no peristaltic rushes no guarding or rebound masses no megaly 4+ pedal edema pitting   Investigations:  Recent Results (from the past 240 hour(s))  MRSA PCR Screening     Status: None   Collection Time: 10/09/15  1:00 AM  Result Value Ref Range Status   MRSA by PCR NEGATIVE NEGATIVE Final    Comment:        The GeneXpert MRSA Assay (FDA approved for NASAL specimens only), is one component of a comprehensive MRSA colonization surveillance program. It is not intended to diagnose MRSA infection nor to guide or monitor treatment for MRSA infections.      Basic Metabolic Panel:  Recent Labs  10/12/15 0412 10/13/15 0502  NA 138  138 140  K 3.7  3.7 4.1  CL 98*  98* 101  CO2 31  32 31  GLUCOSE 92  93 102*  BUN 53*  52* 43*  CREATININE 2.58*  2.60* 2.17*  CALCIUM 8.2*  8.1* 8.2*  PHOS 2.8 2.7   Liver Function Tests:  Recent Labs  10/12/15 0412 10/13/15 0502  AST 59*  --   ALT 24  --   ALKPHOS 65  --   BILITOT 1.3*  --   PROT 6.0*  --   ALBUMIN 2.9*  2.9* 2.8*     CBC:  Recent Labs  10/14/15 0617  WBC 13.7*  HGB 8.2*  HCT 24.7*  MCV 100.4*  PLT 133*    No results  found.    Medications:   Impression:  Active Problems:   Epidural abscess   DNR (do not resuscitate) discussion   Renal failure (ARF), acute on chronic (HCC)   AKI (acute kidney injury) ([place])     Plan: Sequential compression devices will be in place continue diuresis with oral Demadex as per nephrology monitor be met in a.m.   Consultants: Nephrology gastroenterology and pharmacology   Procedures   Antibiotics: IV vancomycin                  Code Status:  Family Communication:  Spoke with son yesterday  Disposition Plan   Time spent: 30 minutes   LOS: 6 days   Joesiah Lonon M   10/14/2015, 12:57 PM

## 2015-10-14 NOTE — Progress Notes (Signed)
Subjective: Patient has some cough but no sputum production  Objective: Vital signs in last 24 hours: Temp:  [98.1 F (36.7 C)-98.4 F (36.9 C)] 98.1 F (36.7 C) (03/15 2122) Pulse Rate:  [89-92] 92 (03/15 2122) Resp:  [18-20] 20 (03/15 2122) BP: (118-119)/(59-68) 118/68 mmHg (03/15 2122) SpO2:  [90 %-99 %] 95 % (03/16 0823)  Intake/Output from previous day: 03/15 0701 - 03/16 0700 In: 720 [P.O.:720] Out: 3400 [Urine:3400] Intake/Output this shift:     Recent Labs  10/14/15 0617  HGB 8.2*    Recent Labs  10/14/15 0617  WBC 13.7*  RBC 2.46*  HCT 24.7*  PLT 133*    Recent Labs  10/12/15 0412 10/13/15 0502  NA 138  138 140  K 3.7  3.7 4.1  CL 98*  98* 101  CO2 31  32 31  BUN 53*  52* 43*  CREATININE 2.58*  2.60* 2.17*  GLUCOSE 92  93 102*  CALCIUM 8.2*  8.1* 8.2*    Recent Labs  10/13/15 0502 10/14/15 0617  INR 2.33* 2.12*    Generally patient is alert and in no apparent distress Chest is clear to auscultation Heart exam revealed regular rate and rhythm no murmur Abdomen: Distended but nontender. Extremities: Patient with  3+plus edema  Assessment/Plan: Problem #1 acute kidney injury superimposed on chronic. Presently the etiology was thought to be secondary to ATN versus vancomycin-induced acute kidney injury. His renal function is progressively improving. Patient appetite is improving. Problem #2 anasarca: Patient presently on iv Lasix . He had 3400 mL of urine output. Patient is still with significant sign of fluid overload. At this moment patient seems to be asymptomatic. Problem #3 anemia: His hemoglobin is low. Stable Problem #4 liver cirrhosis Problem #5 history of epidural abscess: Status post drainage. Presently he is on antibiotics. Patient is afebrile but his white blood cell count is high seems to be improving Problem #6 hypertension: His blood pressure is reasonably controlled Problem #7 history of COPD: Patient is on  inhaler. Problem #8 hypokalemia: Most likely from diuretics. His potassium is normal. Patient is on potassium supplement. Plan: 1] We'll DC Lasix 2] will start patient on Demadex 60 mg by mouth daily 3] will admit her as on 5 mg by mouth twice a day 4] will check renal panel in the morning.   Roberto [name] S 10/14/2015, 8:41 AM

## 2015-10-15 LAB — PROTIME-INR
INR: 2 — ABNORMAL HIGH (ref 0.00–1.49)
Prothrombin Time: 22.6 seconds — ABNORMAL HIGH (ref 11.6–15.2)

## 2015-10-15 LAB — RENAL FUNCTION PANEL
Albumin: 2.6 g/dL — ABNORMAL LOW (ref 3.5–5.0)
Anion gap: 9 (ref 5–15)
BUN: 41 mg/dL — AB (ref 6–20)
CHLORIDE: 94 mmol/L — AB (ref 101–111)
CO2: 33 mmol/L — AB (ref 22–32)
CREATININE: 1.87 mg/dL — AB (ref 0.61–1.24)
Calcium: 8.1 mg/dL — ABNORMAL LOW (ref 8.9–10.3)
GFR calc Af Amer: 41 mL/min — ABNORMAL LOW (ref >60)
GFR calc non Af Amer: 36 mL/min — ABNORMAL LOW (ref >60)
Glucose, Bld: 117 mg/dL — ABNORMAL HIGH (ref 65–99)
POTASSIUM: 3.1 mmol/L — AB (ref 3.5–5.1)
Phosphorus: 3 mg/dL (ref 2.5–4.6)
Sodium: 136 mmol/L (ref 135–145)

## 2015-10-15 LAB — CBC
HEMATOCRIT: 24.8 % — AB (ref 39.0–52.0)
Hemoglobin: 8.3 g/dL — ABNORMAL LOW (ref 13.0–17.0)
MCH: 33.6 pg (ref 26.0–34.0)
MCHC: 33.5 g/dL (ref 30.0–36.0)
MCV: 100.4 fL — AB (ref 78.0–100.0)
PLATELETS: 129 10*3/uL — AB (ref 150–400)
RBC: 2.47 MIL/uL — ABNORMAL LOW (ref 4.22–5.81)
RDW: 21 % — AB (ref 11.5–15.5)
WBC: 14.4 10*3/uL — ABNORMAL HIGH (ref 4.0–10.5)

## 2015-10-15 LAB — AMMONIA: Ammonia: 58 umol/L — ABNORMAL HIGH (ref 9–35)

## 2015-10-15 MED ORDER — RIFAXIMIN 550 MG PO TABS: 550.0000 mg | ORAL_TABLET | Freq: Two times a day (BID) | ORAL | Status: AC

## 2015-10-15 MED ORDER — LORAZEPAM 1 MG PO TABS: 1.0000 mg | ORAL_TABLET | Freq: Four times a day (QID) | ORAL | Status: AC | PRN

## 2015-10-15 MED ORDER — SPIRONOLACTONE 25 MG PO TABS
50.0000 mg | ORAL_TABLET | Freq: Every day | ORAL | Status: DC
  Administered 2015-10-15: 50 mg via ORAL
  Filled 2015-10-15: qty 2

## 2015-10-15 MED ORDER — WARFARIN SODIUM 2 MG PO TABS: 4.0000 mg | ORAL_TABLET | Freq: Once | ORAL | Status: DC

## 2015-10-15 MED ORDER — TORSEMIDE 20 MG PO TABS: 60.0000 mg | ORAL_TABLET | Freq: Every day | ORAL | Status: AC

## 2015-10-15 MED ORDER — OXYCODONE HCL 10 MG PO TABS: 10.0000 mg | ORAL_TABLET | Freq: Four times a day (QID) | ORAL | Status: AC | PRN

## 2015-10-15 MED ORDER — VANCOMYCIN HCL 10 G IV SOLR: 1500.0000 mg | INTRAVENOUS | Status: AC

## 2015-10-15 MED ORDER — POTASSIUM CHLORIDE CRYS ER 20 MEQ PO TBCR
40.0000 meq | EXTENDED_RELEASE_TABLET | Freq: Two times a day (BID) | ORAL | Status: DC
  Administered 2015-10-15: 40 meq via ORAL
  Filled 2015-10-15: qty 2

## 2015-10-15 NOTE — Progress Notes (Signed)
Pharmacy Antibiotic Note  Roberto [name] is a 70 y.o. male admitted on 10/08/2015 with MRSA bacteremia with a spinal abscess. Plan to continue vancomycin through 11/02/2015. Pharmacy has been consulted for Vancomycin dosing. Patient has AKI on top of chronic renal insufficiency.  Vancomycin random level  3/10 at 1900 was 4630mcg/ml--> 3/11 at 100 was 5925mcg/ml--> 3/12 0600 was 6021mcg/ml, 3/15 0500 = 14. SCr has improved.     Goal of therapy: Vanc trough 15-20 mcg/ml  Plan: Continue Vancomycin 1500 mg IV q48hrs Check Vancomycin level is SCr gets worse or as warranted Monitor renal function and clinical course  Height: 6\' 1"  (185.4 cm) Weight: 227 lb 3.2 oz (103.057 kg) IBW/kg (Calculated) : 79.9  Temp (24hrs), Avg:98.5 F (36.9 C), Min:98.2 F (36.8 C), Max:99 F (37.2 C)   Recent Labs Lab 10/08/15 1915 10/09/15 0609  10/10/15 0559 10/11/15 0707 10/12/15 0412 10/13/15 0502 10/14/15 0617 10/15/15 0513 10/15/15 0531  WBC 13.1* 12.4*  --   --  8.7  --   --  13.7* 14.4*  --   CREATININE 4.31* 4.20*  --  3.85*  3.78* 3.16* 2.58*  2.60* 2.17*  --   --  1.87*  VANCORANDOM 30  --   < > 21 17  --  14  --   --   --   < > = values in this interval not displayed.  Estimated Creatinine Clearance: 48.4 mL/min (by C-G formula based on Cr of 1.87).    No Known Allergies  Antimicrobials this admission: vanc 1/29 >> 2/2, 2/8 >> Zosyn 1/29 >> 2/1 ceftaroline 2/2>> 2/8  Dose adjustments this admission: 2/1 VT 26 on 1 gm q12, dose changed to 1250 q24 2/13 VT 23 on 750 mg Q12H SCr 1.43 2/17 VT 21 on 1250 mg every 24 hours SCR 1.87 2/20 VT 18 on 1gm q24hrs Scr 2.31  2/23 VT 26 on 1 gm q24, dose changed to 1500 q48h scr 2.34 3/15 random level = 14, Vanc 1500mg  IV q48hr resumed  Thank you for allowing pharmacy to be a part of this patient's care.  Valrie HartScott Kito Cuffe, [place] Clinical Pharmacist 10/15/2015 11:35 AM

## 2015-10-15 NOTE — Care Management Important Message (Signed)
Important Message  Patient Details  Name: Roberto [name] MRN: [id number] Date of Birth: 08-13-1945   Medicare Important Message Given:  Yes    Adonis HugueninBerkhead, Jerrika Ledlow L, RN 10/15/2015, 8:14 AM

## 2015-10-15 NOTE — Discharge Summary (Signed)
Physician Discharge Summary  Roberto [name] UVO:[id number] DOB: November 09, 1945 DOA: 10/08/2015  PCP: Isabella Stalling, MD  Admit date: 10/08/2015 Discharge date: 10/15/2015   Recommendations for Outpatient Follow-up:  Patient is discharged to skilled nursing facility to continue Coumadin and have INR monitored every second or third day. Requested continue dual diuresis with Demadex and spironolactone and have a B med monitored every other day for 2 weeks to be certain there is no increasing creatinine or electrolyte imbalance likewise is discharged on Ativan 1 mg every 6 hours when necessary for anxiety and oxycodone 10 mg every 6 hours when necessary for abdominal pain patient understands he needs to ask for these medicines in order to receive them. He should follow-up with his neurosurgeon for removal of staples epidural drainage and follow-up with gastroenterology and primary care physician within 2 weeks time he is encouraged to drink liberal amounts of oral fluids to maintain renal perfusion Discharge Diagnoses:  Active Problems:   Epidural abscess   DNR (do not resuscitate) discussion   Renal failure (ARF), acute on chronic (HCC)   AKI (acute kidney injury) (HCC)   Discharge Condition: Stable and improving  Filed Weights   10/08/15 2328 10/10/15 0616 10/12/15 1400  Weight: 113 lb 8 oz (51.483 kg) 106 lb 1.6 oz (48.127 kg) 227 lb 3.2 oz (103.057 kg)    History of present illness:  Patient is known ethanol like 70 year old who is a 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 Of total of 8 weeks I believe there are 2 weeks remaining in his therapy he's had 2 episodes of acute on chronic renal insufficiency PDA by prerenal as a team he as well as possible vancomycin nephrotoxicity he is discharged with his creatinine down to baseline of 1.87 hopefully will stay this way with careful monitoring the  above-mentioned recommendations are to be followed closely hemolytic creatinine of 5 and was seen in combination by nephrology and gastroenterology with vigorous diuresis as well as fluid resuscitation of kidneys his creatinine diminished to 1.87 which was his baseline he was felt able to be discharge he does have a PICC line placed for continuation of vancomycin a dosage of 1500 mg every 48 hours depending on renal function and according to vancomycin protocol  Hospital Course:    Procedures:  PICC line placed twice  Consultations: Gastroenterology and nephrology  Discharge Instructions  Discharge Instructions    Discharge instructions    Complete by:  As directed      Discharge patient    Complete by:  As directed             Medication List    STOP taking these medications        furosemide 20 MG tablet  Commonly known as:  LASIX     HYDROcodone-acetaminophen 5-325 MG tablet  Commonly known as:  NORCO/VICODIN     oxyCODONE-acetaminophen 7.5-325 MG tablet  Commonly known as:  PERCOCET      TAKE these medications        albuterol (2.5 MG/3ML) 0.083% nebulizer solution  Commonly known as:  PROVENTIL  Take 3 mLs (2.5 mg total) by nebulization every 6 (six) hours as needed for wheezing or shortness of breath.     carisoprodol 350 MG tablet  Commonly known as:  SOMA  Take 1 tablet (350 mg total) by mouth 3 (three) times daily as needed for muscle spasms.  citalopram 20 MG tablet  Commonly known as:  CELEXA  Take 1 tablet (20 mg total) by mouth daily.     feeding supplement (ENSURE ENLIVE) Liqd  Take 237 mLs by mouth 2 (two) times daily between meals.     heparin NICU/SCN flush 1 UNIT/ML Soln  Inject 3 mLs into the vein daily.     LORazepam 1 MG tablet  Commonly known as:  ATIVAN  Take 1 tablet (1 mg total) by mouth every 6 (six) hours as needed for anxiety.     magnesium oxide 400 (241.3 Mg) MG tablet  Commonly known as:  MAG-OX  Take 1 tablet (400 mg  total) by mouth 2 (two) times daily.     omeprazole 20 MG capsule  Commonly known as:  PRILOSEC  Take 20 mg by mouth daily.     Oxycodone HCl 10 MG Tabs  Take 1 tablet (10 mg total) by mouth every 6 (six) hours as needed for moderate pain.     pantoprazole 40 MG tablet  Commonly known as:  PROTONIX  Take 1 tablet (40 mg total) by mouth 2 (two) times daily.     potassium chloride SA 20 MEQ tablet  Commonly known as:  K-DUR,KLOR-CON  Take 2 tablets (40 mEq total) by mouth daily.     rifaximin 550 MG Tabs tablet  Commonly known as:  XIFAXAN  Take 1 tablet (550 mg total) by mouth 2 (two) times daily.     spironolactone 25 MG tablet  Commonly known as:  ALDACTONE  Take 1 tablet (25 mg total) by mouth 2 (two) times daily.     sucralfate 1 GM/10ML suspension  Commonly known as:  CARAFATE  Take 10 mLs (1 g total) by mouth 4 (four) times daily -  with meals and at bedtime.     torsemide 20 MG tablet  Commonly known as:  DEMADEX  Take 3 tablets (60 mg total) by mouth daily.     vancomycin 1,500 mg in sodium chloride 0.9 % 500 mL  Inject 1,500 mg into the vein every other day.     warfarin 2 MG tablet  Commonly known as:  COUMADIN  Take 1 tablet (2 mg total) by mouth once.       No Known Allergies    The results of significant diagnostics from this hospitalization (including imaging, microbiology, ancillary and laboratory) are listed below for reference.    Significant Diagnostic Studies: Dg Chest 1 View  09/26/2015  CLINICAL DATA:  Status post PICC line placement. EXAM: CHEST 1 VIEW COMPARISON:  Chest radiograph 09/15/2015. FINDINGS: Right upper extremity PICC line is present with tip projecting over the superior vena cava. Stable cardiac and mediastinal contours. Low lung volumes. Bilateral lower lung heterogeneous opacities. No pleural effusion or pneumothorax. Midline surgical staple line. IMPRESSION: Right upper extremity PICC line tip projects over the superior cavoatrial  junction. Basilar airspace opacities may represent atelectasis or infection. Electronically Signed   By: Annia Belt M.D.   On: 09/26/2015 15:42   Dg Chest 2 View  10/08/2015  CLINICAL DATA:  Weakness and short of breath EXAM: CHEST  2 VIEW COMPARISON:  09/26/2015 FINDINGS: Right arm PICC tip in the lower SVC unchanged Mild bibasilar atelectasis with some interval improvement. Negative for heart failure. No edema or effusion. Coronary stent. Skin staples in the posterior back from prior laminectomy. Severe compression fracture of T8 which has progressed since the prior MRI of 09/07/2015. IMPRESSION: Improvement in bibasilar atelectasis. No superimposed  acute cardiothoracic abnormality. Progression of severe compression fracture T8 Electronically Signed   By: Marlan Palau M.D.   On: 10/08/2015 18:55   US Abdomen Limited  09/17/2015  CLINICAL DATA:  Ascites, cirrhosis, COPD, hypertension, MRSA, current epidural abscess post surgery EXAM: LIMITED ABDOMEN ULTRASOUND FOR ASCITES TECHNIQUE: Limited ultrasound survey for ascites was performed in all four abdominal quadrants. COMPARISON:  CT abdomen and pelvis 09/15/2015 FINDINGS: Small volumes of ascites are seen in the flanks bilaterally. Fluid volume is substantially less than expected from the prior CT exam likely due to interval mobilization of fluid and diuresis. Fluid is scattered from the flanks to perihepatic region and pelvis. Volume of fluid visualized is subjectively insufficient for expected significant therapeutic benefit. IMPRESSION: Scattered small amounts of ascites, substantially less than expected from prior CT exam, likely due to interval fluid mobilization and diuresis. Electronically Signed   By: Ulyses Southward M.D.   On: 09/17/2015 12:03   Dg Abd 2 Views  10/08/2015  CLINICAL DATA:  Abdominal pain and distension. EXAM: ABDOMEN - 2 VIEW COMPARISON:  Chest radiograph earlier this day. Abdominal radiograph 09/26/2015 FINDINGS: Diffuse gaseous  distention of large and small bowel. Air under the right hemidiaphragm on supine view is felt to be within colon, no free air seen on chest radiographs earlier this day. Midline staples over the lower chest and upper abdomen. Pelvic phleboliths are seen. Detailed evaluation limited by soft tissue attenuation from body habitus. IMPRESSION: Diffuse gaseous distention of large and small bowel. Right upper quadrant air is felt to be within colon, no free air was seen on chest radiographs earlier this day. Electronically Signed   By: Rubye Oaks M.D.   On: 10/08/2015 23:08   Dg Abd 2 Views  09/26/2015  CLINICAL DATA:  Abdominal distention. Evaluate ileus and cecal diameter. EXAM: ABDOMEN - 2 VIEW COMPARISON:  09/15/2015 FINDINGS: Improving gaseous distention of bowel. Gas within mildly prominent small bowel loops in the left abdomen an nondistended colon. Cecum not currently visualized or measurable. No free air organomegaly. IMPRESSION: Improving gaseous distention of bowel. No current evidence of obstruction or ileus. Electronically Signed   By: Charlett Nose M.D.   On: 09/26/2015 14:09    Microbiology: Recent Results (from the past 240 hour(s))  MRSA PCR Screening     Status: None   Collection Time: 10/09/15  1:00 AM  Result Value Ref Range Status   MRSA by PCR NEGATIVE NEGATIVE Final    Comment:        The GeneXpert MRSA Assay (FDA approved for NASAL specimens only), is one component of a comprehensive MRSA colonization surveillance program. It is not intended to diagnose MRSA infection nor to guide or monitor treatment for MRSA infections.      Labs: Basic Metabolic Panel:  Recent Labs Lab 10/10/15 0559 10/11/15 0707 10/12/15 0412 10/13/15 0502 10/15/15 0531  NA 137  136 138 138  138 140 136  K 4.2  4.1 3.4* 3.7  3.7 4.1 3.1*  CL 102  102 100* 98*  98* 101 94*  CO2 32 31 33*  GLUCOSE 123*  121* 98 92  93 102* 117*  BUN 67*  67* 58* 53*  52* 43*  41*  CREATININE 3.85*  3.78* 3.16* 2.58*  2.60* 2.17* 1.87*  CALCIUM 8.3*  8.2* 8.2* 8.2*  8.1* 8.2* 8.1*  PHOS 4.9*  --  2.8 2.7 3.0   Liver Function Tests:  Recent Labs Lab 10/08/15 1915  10/09/15 0609 10/10/15 0559 10/11/15 0707 10/12/15 0412 10/13/15 0502 10/15/15 0531  AST 57* 53* 48* 59* 59*  --   --   ALT 25 25 23 23 24   --   --   ALKPHOS 89 87 77 67 65  --   --   BILITOT 1.5* 2.0* 1.1 1.3* 1.3*  --   --   PROT 6.3* 6.1* 5.9* 6.0* 6.0*  --   --   ALBUMIN 2.1* 2.1* 2.3*  2.3* 2.6* 2.9*  2.9* 2.8* 2.6*   No results for input(s): LIPASE, AMYLASE in the last 168 hours.  Recent Labs Lab 10/10/15 1413 10/15/15 0531  AMMONIA 54* 58*   CBC:  Recent Labs Lab 10/08/15 1915 10/09/15 0609 10/11/15 0707 10/14/15 0617 10/15/15 0513  WBC 13.1* 12.4* 8.7 13.7* 14.4*  NEUTROABS 8.1*  --   --   --   --   HGB 9.7* 8.9* 8.2* 8.2* 8.3*  HCT 28.1* 26.1* 24.2* 24.7* 24.8*  MCV 95.6 96.3 98.4 100.4* 100.4*  PLT 134* 87* 110* 133* 129*   Cardiac Enzymes: No results for input(s): CKTOTAL, CKMB, CKMBINDEX, TROPONINI in the last 168 hours. BNP: BNP (last 3 results)  Recent Labs  09/12/15 1345  BNP 57.1    ProBNP (last 3 results) No results for input(s): PROBNP in the last 8760 hours.  CBG:  Recent Labs Lab 10/09/15 2110  GLUCAP 117*       Signed:  Ron Beske M  [facility] Pager: [phone] 10/15/2015, 1:24 PM

## 2015-10-15 NOTE — Progress Notes (Signed)
Patient states he is been pulling his IVs, therefore mittens were placed. He denies nausea or vomiting. He has not experienced heartburn today. Patient knows he is at [facility]. He knows it is March but does not know the year or the name of current [place] President. Abdomen is full but soft without tenderness. Percussion noticed tympanitic across upper abdomen. Lower extremity edema appears less today as evidenced by wrinkling to skin of both feet. Serum ammonia remains mildly elevated at 58. Serum creatinine is down to 1.87.  Patient's confusion felt to be multifactorial. I would recommend that he continue Xifaxan. If confusion becomes more pronounced than add low-dose lactulose.  Will have to watch for abdominal distention if he has to go back on lactulose.  Patient is being discharged [facility] today.

## 2015-10-15 NOTE — Progress Notes (Signed)
Pt D/C[place] [facility] [place] via Doctor, general practicetretcher to Ambulance.  Pt in stable condition.

## 2015-10-15 NOTE — Progress Notes (Signed)
ANTICOAGULATION CONSULT NOTE  Pharmacy Consult for Coumadin Indication: VTE prophylaxis  No Known Allergies  Patient Measurements: Height: 6\' 1"  (185.4 cm) Weight: 227 lb 3.2 oz (103.057 kg) IBW/kg (Calculated) : 79.9  Vital Signs: Temp: 98.2 F (36.8 C) (03/17 0452) Temp Source: Oral (03/17 0452) BP: 115/53 mmHg (03/17 0452) Pulse Rate: 101 (03/17 0452)  Labs:  Recent Labs  10/13/15 0502 10/14/15 0617 10/15/15 0513 10/15/15 0531  HGB  --  8.2* 8.3*  --   HCT  --  24.7* 24.8*  --   PLT  --  133* 129*  --   LABPROT 25.4* 23.5* 22.6*  --   INR 2.33* 2.12* 2.00*  --   CREATININE 2.17*  --   --  1.87*   Medical History: Past Medical History  Diagnosis Date  . COPD (chronic obstructive pulmonary disease) (HCC)   . Hypertension   . Hepatic steatosis 12/14/2011  . Compression fracture of L1 lumbar vertebra (HCC) 12/14/2011  . C2 cervical fracture (HCC)   . Duodenal ulcer with hemorrhage 12/15/2011    Likely source of bleeding.per EGD; Dr. Karilyn Cotaehman  . Esophageal varices without bleeding (HCC) 12/15/2011    per EGD  . Cirrhosis (HCC) 12/16/2011    per ultrasound  . Hx of substance abuse 12/17/2011  . 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 Hepatic encephalopathy (HCC)   . Helicobacter pylori antibody positive 12/19/2011  . Encephalopathy 05/18/2012  . UTI (urinary tract infection) 12/13/2011  . Ascites 05/18/2012    S/p paracentesis yielding 7880 mL  . Cirrhosis (HCC)   . Depression    Medications:  Prescriptions prior to admission  Medication Sig Dispense Refill Last Dose  . albuterol (PROVENTIL) (2.5 MG/3ML) 0.083% nebulizer solution Take 3 mLs (2.5 mg total) by nebulization every 6 (six) hours as needed for wheezing or shortness of breath. 75 mL 12 unknown  . citalopram (CELEXA) 20 MG tablet Take 1 tablet (20 mg total) by mouth daily. 30 tablet 2 10/08/2015 at 800a  . feeding supplement, ENSURE ENLIVE, (ENSURE ENLIVE) LIQD Take 237  mLs by mouth 2 (two) times daily between meals. 237 mL 12 10/09/2015 at Unknown time  . furosemide (LASIX) 20 MG tablet Take 2 tablets (40 mg total) by mouth daily. (Patient taking differently: Take 20 mg by mouth 2 (two) times daily. ) 60 tablet 1 10/08/2015 at 800a  . Heparin Lock Flush (HEPARIN NICU/SCN FLUSH) 1 UNIT/ML SOLN Inject 3 mLs into the vein daily.   10/08/2015 at 1100a  . magnesium oxide (MAG-OX) 400 (241.3 Mg) MG tablet Take 1 tablet (400 mg total) by mouth 2 (two) times daily. 30 tablet 3 10/08/2015 at 800a  . omeprazole (PRILOSEC) 20 MG capsule Take 20 mg by mouth daily.   10/08/2015 at 730a  . oxyCODONE-acetaminophen (PERCOCET) 7.5-325 MG tablet Take 1 tablet by mouth every 6 (six) hours as needed for moderate pain or severe pain.   10/08/2015 at 1125a  . pantoprazole (PROTONIX) 40 MG tablet Take 1 tablet (40 mg total) by mouth 2 (two) times daily. 60 tablet 3 Past Month at Unknown time  . potassium chloride SA (K-DUR,KLOR-CON) 20 MEQ tablet Take 2 tablets (40 mEq total) by mouth daily. 30 tablet 1 10/08/2015 at 800a  . spironolactone (ALDACTONE) 25 MG tablet Take 1 tablet (25 mg total) by mouth 2 (two) times daily. 60 tablet 3 10/08/2015 at 800a  . sucralfate (CARAFATE) 1 GM/10ML suspension Take 10 mLs (1 g total) by mouth 4 (four)  times daily -  with meals and at bedtime. 420 mL 0 10/08/2015 at 1200  . carisoprodol (SOMA) 350 MG tablet Take 1 tablet (350 mg total) by mouth 3 (three) times daily as needed for muscle spasms. (Patient not taking: Reported on 10/08/2015) 30 tablet 0 unknown  . HYDROcodone-acetaminophen (NORCO/VICODIN) 5-325 MG tablet Take 1 tablet by mouth every 6 (six) hours as needed for severe pain. (Patient not taking: Reported on 10/08/2015) 6 tablet 0 09/15/2015 at Unknown time  . vancomycin 1,500 mg in sodium chloride 0.9 % 500 mL Inject 1,500 mg into the vein every other day. (Patient not taking: Reported on 10/08/2015) 1000 mL 3   . warfarin (COUMADIN) 2 MG tablet Take 1  tablet (2 mg total) by mouth once. (Patient not taking: Reported on 10/08/2015) 30 tablet 1    Assessment: 70yo male.  Pharmacy consulted to manage Coumadin for VTE prophylaxis.  INR is therapeutic but continues to trend down and is at low end of range. No bleeding noted.  Goal of Therapy:  INR 2-3   Plan:  Coumadin 4 mg po today (to boost INR) INR daily Monitor CBC, s/sx of bleeding complications  Valrie Hart A, RPH   10/15/2015,11:33 AM

## 2015-10-15 NOTE — Progress Notes (Signed)
Subjective: Patient complains of back pain and he states that he is not improving. He is asking for pain medication. Patient denies any nausea or vomiting.  Objective: Vital signs in last 24 hours: Temp:  [98.2 F (36.8 C)-99 F (37.2 C)] 98.2 F (36.8 C) (03/17 0452) Pulse Rate:  [96-108] 101 (03/17 0452) Resp:  [16-21] 16 (03/17 0452) BP: (103-115)/(52-62) 115/53 mmHg (03/17 0452) SpO2:  [92 %-100 %] 93 % (03/17 0817)  Intake/Output from previous day: 03/16 0701 - 03/17 0700 In: 600 [P.O.:600] Out: 3350 [Urine:3350] Intake/Output this shift:     Recent Labs  10/14/15 0617 10/15/15 0513  HGB 8.2* 8.3*    Recent Labs  10/14/15 0617 10/15/15 0513  WBC 13.7* 14.4*  RBC 2.46* 2.47*  HCT 24.7* 24.8*  PLT 133* 129*    Recent Labs  10/13/15 0502 10/15/15 0531  NA 140 136  K 4.1 3.1*  CL 101 94*  CO2 31 33*  BUN 43* 41*  CREATININE 2.17* 1.87*  GLUCOSE 102* 117*  CALCIUM 8.2* 8.1*    Recent Labs  10/14/15 0617 10/15/15 0513  INR 2.12* 2.00*    Generally patient is alert and in no apparent distress Chest is clear to auscultation Heart exam revealed regular rate and rhythm no murmur Abdomen: Distended but nontender. Extremities: Patient with  2+plus edema  Assessment/Plan: Problem #1 acute kidney injury superimposed on chronic. Presently the etiology was thought to be secondary to ATN versus vancomycin-induced acute kidney injury. His BUN and creatinine is continuously declining. Presently his creatinine is returning to his baseline. Problem #2 anasarca: Patient presently on Demadex and metalazone. He had about 3300 mL of urine output. Presently his anasarca is improving. Problem #3 anemia: His hemoglobin is low. Stable Problem #4 liver cirrhosis Problem #5 history of epidural abscess: Status post drainage. Patient complains of back pain otherwise feels okay. Problem #6 hypertension: His blood pressure is reasonably controlled Problem #7 history of  COPD: Patient is on inhaler. Denies any difficulty breathing. Problem #8 hypokalemia: Most likely from diuretics. Presently patient is on potassium 40 mEq by mouth once a day his potassium however seems to be declining. Plan: 1] we will continue with Demadex 2] will DC metolazone 3] will start patient on Aldactone 50 mg by mouth once a day 4] will increase KCl to 40 mEq by mouth twice a day 5] will check renal panel in the morning.   Roberto [name] S 10/15/2015, 9:24 AM

## 2015-10-15 NOTE — Progress Notes (Addendum)
LCSW reviewed case with MD. MD feels patient is stable for DC.  All clinicals sent to facility for patient to re-admit back to [facility] [place].  Spoke with Thayer Ohmhris at facility, agreeable for patient to return.  Spoke with son: Thayer OhmChris face to face. Agreeable to plan. Will meet patient at facility this evening. Patient will transport by EMS. Patient will transport at 4:30 as scheduled with [facility] LCSW arranged transport.   No other needs.   Deretha EmoryHannah Mikea Quadros LCSW, MSW

## 2015-10-16 ENCOUNTER — Encounter (HOSPITAL_COMMUNITY): Payer: Self-pay | Admitting: *Deleted

## 2015-10-16 ENCOUNTER — Emergency Department (HOSPITAL_COMMUNITY): Payer: Medicare Other

## 2015-10-16 ENCOUNTER — Encounter (HOSPITAL_COMMUNITY): Payer: Self-pay

## 2015-10-16 ENCOUNTER — Emergency Department (HOSPITAL_COMMUNITY)
Admission: EM | Admit: 2015-10-16 | Discharge: 2015-10-16 | Disposition: A | Discharge status: Home / Self Care | Payer: Medicare Other | Attending: Emergency Medicine | Admitting: Emergency Medicine

## 2015-10-16 ENCOUNTER — Emergency Department (HOSPITAL_COMMUNITY)
Admission: EM | Admit: 2015-10-16 | Discharge: 2015-10-16 | Disposition: A | Discharge status: Home / Self Care | Payer: Medicare Other | Source: Home / Self Care | Attending: Emergency Medicine | Admitting: Emergency Medicine

## 2015-10-16 DIAGNOSIS — Y828 Other medical devices associated with adverse incidents: Secondary | ICD-10-CM | POA: Insufficient documentation

## 2015-10-16 DIAGNOSIS — I10 Essential (primary) hypertension: Secondary | ICD-10-CM | POA: Insufficient documentation

## 2015-10-16 DIAGNOSIS — Z79899 Other long term (current) drug therapy: Secondary | ICD-10-CM | POA: Insufficient documentation

## 2015-10-16 DIAGNOSIS — T839XXA Unspecified complication of genitourinary prosthetic device, implant and graft, initial encounter: Secondary | ICD-10-CM

## 2015-10-16 DIAGNOSIS — Y718 Miscellaneous cardiovascular devices associated with adverse incidents, not elsewhere classified: Secondary | ICD-10-CM

## 2015-10-16 DIAGNOSIS — F329 Major depressive disorder, single episode, unspecified: Secondary | ICD-10-CM | POA: Diagnosis not present

## 2015-10-16 DIAGNOSIS — F17210 Nicotine dependence, cigarettes, uncomplicated: Secondary | ICD-10-CM | POA: Insufficient documentation

## 2015-10-16 DIAGNOSIS — T85528A Displacement of other gastrointestinal prosthetic devices, implants and grafts, initial encounter: Secondary | ICD-10-CM | POA: Insufficient documentation

## 2015-10-16 DIAGNOSIS — J449 Chronic obstructive pulmonary disease, unspecified: Secondary | ICD-10-CM | POA: Insufficient documentation

## 2015-10-16 DIAGNOSIS — Z9889 Other specified postprocedural states: Secondary | ICD-10-CM

## 2015-10-16 DIAGNOSIS — T83098A Other mechanical complication of other indwelling urethral catheter, initial encounter: Secondary | ICD-10-CM | POA: Insufficient documentation

## 2015-10-16 LAB — COMPREHENSIVE METABOLIC PANEL
ALT: 25 U/L (ref 17–63)
AST: 65 U/L — AB (ref 15–41)
Albumin: 2.9 g/dL — ABNORMAL LOW (ref 3.5–5.0)
Alkaline Phosphatase: 70 U/L (ref 38–126)
Anion gap: 11 (ref 5–15)
BILIRUBIN TOTAL: 2.1 mg/dL — AB (ref 0.3–1.2)
BUN: 42 mg/dL — AB (ref 6–20)
CHLORIDE: 92 mmol/L — AB (ref 101–111)
CO2: 34 mmol/L — ABNORMAL HIGH (ref 22–32)
CREATININE: 1.82 mg/dL — AB (ref 0.61–1.24)
Calcium: 8.3 mg/dL — ABNORMAL LOW (ref 8.9–10.3)
GFR calc Af Amer: 43 mL/min — ABNORMAL LOW (ref >60)
GFR, EST NON AFRICAN AMERICAN: 37 mL/min — AB (ref >60)
Glucose, Bld: 95 mg/dL (ref 65–99)
Potassium: 3.6 mmol/L (ref 3.5–5.1)
Sodium: 137 mmol/L (ref 135–145)
TOTAL PROTEIN: 6.5 g/dL (ref 6.5–8.1)

## 2015-10-16 LAB — URINE MICROSCOPIC-ADD ON

## 2015-10-16 LAB — URINALYSIS, ROUTINE W REFLEX MICROSCOPIC
BILIRUBIN URINE: NEGATIVE
Glucose, UA: NEGATIVE mg/dL
KETONES UR: NEGATIVE mg/dL
Nitrite: NEGATIVE
PH: 6 (ref 5.0–8.0)
Protein, ur: NEGATIVE mg/dL

## 2015-10-16 LAB — CBC WITH DIFFERENTIAL/PLATELET
Basophils Absolute: 0.1 10*3/uL (ref 0.0–0.1)
Basophils Relative: 1 %
Eosinophils Absolute: 2.3 10*3/uL — ABNORMAL HIGH (ref 0.0–0.7)
Eosinophils Relative: 16 %
HEMATOCRIT: 25.2 % — AB (ref 39.0–52.0)
HEMOGLOBIN: 8.6 g/dL — AB (ref 13.0–17.0)
LYMPHS ABS: 1.6 10*3/uL (ref 0.7–4.0)
Lymphocytes Relative: 11 %
MCH: 33.9 pg (ref 26.0–34.0)
MCHC: 34.1 g/dL (ref 30.0–36.0)
MCV: 99.2 fL (ref 78.0–100.0)
MONOS PCT: 12 %
Monocytes Absolute: 1.7 10*3/uL — ABNORMAL HIGH (ref 0.1–1.0)
NEUTROS ABS: 8.4 10*3/uL — AB (ref 1.7–7.7)
NEUTROS PCT: 60 %
Platelets: ADEQUATE 10*3/uL (ref 150–400)
RBC: 2.54 MIL/uL — ABNORMAL LOW (ref 4.22–5.81)
RDW: 21.1 % — ABNORMAL HIGH (ref 11.5–15.5)
WBC: 14 10*3/uL — ABNORMAL HIGH (ref 4.0–10.5)

## 2015-10-16 LAB — PROTIME-INR
INR: 1.58 — ABNORMAL HIGH (ref 0.00–1.49)
Prothrombin Time: 18.9 seconds — ABNORMAL HIGH (ref 11.6–15.2)

## 2015-10-16 LAB — AMMONIA: AMMONIA: 60 umol/L — AB (ref 9–35)

## 2015-10-16 MED ORDER — OXYCODONE HCL 5 MG PO TABS
10.0000 mg | ORAL_TABLET | Freq: Once | ORAL | Status: AC
  Administered 2015-10-16: 10 mg via ORAL
  Filled 2015-10-16: qty 2

## 2015-10-16 NOTE — ED Provider Notes (Signed)
CSN: [id number]     Arrival date & time 10/16/15  0354 History   First MD Initiated Contact with Patient 10/16/15 0414     Chief Complaint  Patient presents with  . Vascular Access Problem     (Consider location/radiation/quality/duration/timing/severity/associated sxs/prior Treatment) HPI Comments: Pt with hx of COPD, liver cirrhosis from alcohol abuse, on iv vanc for thoracic epidural abscess. Pt comes in with cc of picc line removal and foley catheter removal. Pt is oriented to self and place. He has no complains - just "feels sick." Pt has no abd pain, chest pain, cough, nausea, vomiting.   ROS 10 Systems reviewed and are negative for acute change except as noted in the HPI.      The history is provided by the patient.    Past Medical History  Diagnosis Date  . COPD (chronic obstructive pulmonary disease) (HCC)   . Hypertension   . Hepatic steatosis 12/14/2011  . Compression fracture of L1 lumbar vertebra (HCC) 12/14/2011  . C2 cervical fracture (HCC)   . Duodenal ulcer with hemorrhage 12/15/2011    Likely source of bleeding.per EGD; Dr. Karilyn Cotaehman  . Esophageal varices without bleeding (HCC) 12/15/2011    per EGD  . Cirrhosis (HCC) 12/16/2011    per ultrasound  . Hx of substance abuse 12/17/2011  . 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 Hepatic encephalopathy (HCC)   . Helicobacter pylori antibody positive 12/19/2011  . Encephalopathy 05/18/2012  . UTI (urinary tract infection) 12/13/2011  . Ascites 05/18/2012    S/p paracentesis yielding 7880 mL  . Cirrhosis (HCC)   . Depression    Past Surgical History  Procedure Laterality Date  . Coronary angioplasty with stent placement    . Thoracic laminectomy for epidural abscess N/A 09/07/2015    Procedure: THORACIC FIVE-THORACIC ELEVEN LAMINECTOMY FOR EPIDURAL ABSCESS;  Surgeon: Loura HaltBenjamin Jared Ditty, MD;  Location: [facility];  Service: Neurosurgery;  Laterality: N/A;   No family history  on file. Social History  Substance Use Topics  . Smoking status: Current Every Day Smoker -- 1.00 packs/day    Types: Cigarettes  . Smokeless tobacco: Current User     Comment: 1 1/2 pack a day  . Alcohol Use: Yes     Comment: drinking everyday.    Review of Systems    Allergies  Review of patient's allergies indicates no known allergies.  Home Medications   Prior to Admission medications   Medication Sig Start Date End Date Taking? Authorizing Provider  albuterol (PROVENTIL) (2.5 MG/3ML) 0.083% nebulizer solution Take 3 mLs (2.5 mg total) by nebulization every 6 (six) hours as needed for wheezing or shortness of breath. 06/10/15   Oneta Rackanika N Lewis, NP  carisoprodol (SOMA) 350 MG tablet Take 1 tablet (350 mg total) by mouth 3 (three) times daily as needed for muscle spasms. Patient not taking: Reported on 10/08/2015 09/13/15   Hollice EspySendil K Krishnan, MD  citalopram (CELEXA) 20 MG tablet Take 1 tablet (20 mg total) by mouth daily. 09/30/15   Oval Linseyichard Dondiego, MD  feeding supplement, ENSURE ENLIVE, (ENSURE ENLIVE) LIQD Take 237 mLs by mouth 2 (two) times daily between meals. 09/30/15   Oval Linseyichard Dondiego, MD  Heparin Lock Flush (HEPARIN NICU/SCN FLUSH) 1 UNIT/ML SOLN Inject 3 mLs into the vein daily.    Historical Provider, MD  LORazepam (ATIVAN) 1 MG tablet Take 1 tablet (1 mg total) by mouth every 6 (six) hours as needed for anxiety. 10/15/15   Oval Linseyichard Dondiego, MD  magnesium oxide (MAG-OX) 400 (241.3 Mg) MG tablet Take 1 tablet (400 mg total) by mouth 2 (two) times daily. 09/30/15   Oval Linsey, MD  omeprazole (PRILOSEC) 20 MG capsule Take 20 mg by mouth daily.    Historical Provider, MD  oxyCODONE 10 MG TABS Take 1 tablet (10 mg total) by mouth every 6 (six) hours as needed for moderate pain. 10/15/15   Oval Linsey, MD  pantoprazole (PROTONIX) 40 MG tablet Take 1 tablet (40 mg total) by mouth 2 (two) times daily. 09/30/15   Oval Linsey, MD  potassium chloride SA (K-DUR,KLOR-CON) 20 MEQ  tablet Take 2 tablets (40 mEq total) by mouth daily. 09/30/15   Oval Linsey, MD  rifaximin (XIFAXAN) 550 MG TABS tablet Take 1 tablet (550 mg total) by mouth 2 (two) times daily. 10/15/15   Oval Linsey, MD  spironolactone (ALDACTONE) 25 MG tablet Take 1 tablet (25 mg total) by mouth 2 (two) times daily. 09/30/15   Oval Linsey, MD  sucralfate (CARAFATE) 1 GM/10ML suspension Take 10 mLs (1 g total) by mouth 4 (four) times daily -  with meals and at bedtime. 09/30/15   Oval Linsey, MD  torsemide (DEMADEX) 20 MG tablet Take 3 tablets (60 mg total) by mouth daily. 10/15/15   Oval Linsey, MD  vancomycin 1,500 mg in sodium chloride 0.9 % 500 mL Inject 1,500 mg into the vein every other day. 10/15/15   Oval Linsey, MD  warfarin (COUMADIN) 2 MG tablet Take 1 tablet (2 mg total) by mouth once. Patient not taking: Reported on 10/08/2015 09/30/15   Oval Linsey, MD   BP 131/93 mmHg  Pulse 99  Temp(Src) 98.6 F (37 C) (Oral)  Resp 20  SpO2 96% Physical Exam  Constitutional: He appears well-developed.  HENT:  Head: Normocephalic and atraumatic.  Eyes: Conjunctivae and EOM are normal. Pupils are equal, round, and reactive to light.  Neck: Normal range of motion. Neck supple.  Cardiovascular: Normal rate and regular rhythm.   Pulmonary/Chest: Effort normal and breath sounds normal.  Diffuse rhonchi  Abdominal: Soft. Bowel sounds are normal. He exhibits no distension. There is no tenderness.  Genitourinary:  Bloody meatus  Neurological: He is alert. No cranial nerve deficit.  Skin: Skin is warm.  Nursing note and vitals reviewed.   ED Course  Procedures (including critical care time) Labs Review Labs Reviewed - No data to display  Imaging Review No results found. I have personally reviewed and evaluated these images and lab results as part of my medical decision-making.   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  + trauma to the urethra. Blood at the meatus. Pt has no scrotal or lower quadrant pain. Will place a foley - if complicated, might need urology.  + picc issues - picc line ordered.  Basic labs ordered. Anticipate d/c. Pt was just discharged yday. VSS, WNL.  Derwood Kaplan, MD 10/16/15 [phone]

## 2015-10-16 NOTE — ED Notes (Signed)
Pt is a new resident at [facility] in [place] where he apparently pulled a PICC line out of his right upper arm this am.   Pt apparently also pulled his foley catheter out.

## 2015-10-16 NOTE — ED Notes (Signed)
[place] from [facility] [place] for second PICC line placed in 24hours. Patient arrives with IV to left hand.

## 2015-10-16 NOTE — ED Notes (Signed)
Contacted [facility].  Roberto DikeJennifer called back to let us know that the Clinician will be here at 10 am.  Nurse informed.

## 2015-10-16 NOTE — Discharge Instructions (Signed)
Please do not pull or remove the tube in your penis or your arm Return to the ER as needed if having complications of these tubes or for any other problems.

## 2015-10-16 NOTE — ED Notes (Addendum)
Pt returns to er tonight after he pulled out his picc line that was placed earlier in the day, pt has iv in place to left hand,

## 2015-10-16 NOTE — ED Notes (Signed)
Called [facility] for transport back to [facility] in [place].

## 2015-10-16 NOTE — ED Provider Notes (Signed)
The pt has had foley replaced PICC replaced VS normal appearing, pt resting - stable for d/c.  Eber HongBrian Cavin Longman, MD 10/16/15 1140

## 2015-10-16 NOTE — ED Notes (Addendum)
Pt is a resident of [facility] [place] who was sent to er after he pulled a picc line from right upper arm and pulled his foley out tonight, pt is confused, unable to state where he is or what month it is, pt has blood around meatus, bleeding controlled,

## 2015-10-16 NOTE — ED Notes (Signed)
Spoke with vascular, patient is unable to have PICC placed tonight, instructed to call Vascular tomorrow when patient arrives back in ED

## 2015-10-16 NOTE — Discharge Instructions (Signed)
Return tomorrow after 7am to get picc line placed

## 2015-10-16 NOTE — ED Notes (Signed)
[facility]ascular at bedside for PICC insertion.

## 2015-10-16 NOTE — ED Notes (Signed)
Patient discharged to [facility] [place] VIA [facility]

## 2015-10-16 NOTE — ED Provider Notes (Signed)
CSN: [id number]     Arrival date & time 10/16/15  2028 History   First MD Initiated Contact with Patient 10/16/15 2037     Chief Complaint  Patient presents with  . Vascular Access Problem     (Consider location/radiation/quality/duration/timing/severity/associated sxs/prior Treatment) The history is provided by the EMS personnel (Patient had his PICC line to come out today he was sent to the emergency room for a new PICC line. He has IV access in his hand).    Past Medical History  Diagnosis Date  . COPD (chronic obstructive pulmonary disease) (HCC)   . Hypertension   . Hepatic steatosis 12/14/2011  . Compression fracture of L1 lumbar vertebra (HCC) 12/14/2011  . C2 cervical fracture (HCC)   . Duodenal ulcer with hemorrhage 12/15/2011    Likely source of bleeding.per EGD; Dr. Karilyn Cotaehman  . Esophageal varices without bleeding (HCC) 12/15/2011    per EGD  . Cirrhosis (HCC) 12/16/2011    per ultrasound  . Hx of substance abuse 12/17/2011  . 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 Hepatic encephalopathy (HCC)   . Helicobacter pylori antibody positive 12/19/2011  . Encephalopathy 05/18/2012  . UTI (urinary tract infection) 12/13/2011  . Ascites 05/18/2012    S/p paracentesis yielding 7880 mL  . Cirrhosis (HCC)   . Depression    Past Surgical History  Procedure Laterality Date  . Coronary angioplasty with stent placement    . Thoracic laminectomy for epidural abscess N/A 09/07/2015    Procedure: THORACIC FIVE-THORACIC ELEVEN LAMINECTOMY FOR EPIDURAL ABSCESS;  Surgeon: Loura HaltBenjamin Jared Ditty, MD;  Location: [facility];  Service: Neurosurgery;  Laterality: N/A;   No family history on file. Social History  Substance Use Topics  . Smoking status: Current Every Day Smoker -- 1.00 packs/day    Types: Cigarettes  . Smokeless tobacco: Current User     Comment: 1 1/2 pack a day  . Alcohol Use: Yes     Comment: drinking everyday.    Review of Systems   Constitutional: Negative for appetite change and fatigue.  HENT: Negative for congestion, ear discharge and sinus pressure.   Eyes: Negative for discharge.  Respiratory: Negative for cough.   Cardiovascular: Negative for chest pain.  Gastrointestinal: Negative for abdominal pain and diarrhea.  Genitourinary: Negative for frequency and hematuria.  Musculoskeletal: Negative for back pain.  Skin: Negative for rash.  Neurological: Negative for seizures and headaches.  Psychiatric/Behavioral: Negative for hallucinations.      Allergies  Review of patient's allergies indicates no known allergies.  Home Medications   Prior to Admission medications   Medication Sig Start Date End Date Taking? Authorizing Provider  albuterol (PROVENTIL) (2.5 MG/3ML) 0.083% nebulizer solution Take 3 mLs (2.5 mg total) by nebulization every 6 (six) hours as needed for wheezing or shortness of breath. 06/10/15   Oneta Rackanika N Lewis, NP  carisoprodol (SOMA) 350 MG tablet Take 1 tablet (350 mg total) by mouth 3 (three) times daily as needed for muscle spasms. Patient not taking: Reported on 10/08/2015 09/13/15   Hollice EspySendil K Krishnan, MD  citalopram (CELEXA) 20 MG tablet Take 1 tablet (20 mg total) by mouth daily. 09/30/15   Oval Linseyichard Dondiego, MD  feeding supplement, ENSURE ENLIVE, (ENSURE ENLIVE) LIQD Take 237 mLs by mouth 2 (two) times daily between meals. 09/30/15   Oval Linseyichard Dondiego, MD  Heparin Lock Flush (HEPARIN NICU/SCN FLUSH) 1 UNIT/ML SOLN Inject 3 mLs into the vein daily.    Historical Provider, MD  LORazepam (ATIVAN)  1 MG tablet Take 1 tablet (1 mg total) by mouth every 6 (six) hours as needed for anxiety. 10/15/15   Oval Linsey, MD  magnesium oxide (MAG-OX) 400 (241.3 Mg) MG tablet Take 1 tablet (400 mg total) by mouth 2 (two) times daily. 09/30/15   Oval Linsey, MD  omeprazole (PRILOSEC) 20 MG capsule Take 20 mg by mouth daily.    Historical Provider, MD  oxyCODONE 10 MG TABS Take 1 tablet (10 mg total) by  mouth every 6 (six) hours as needed for moderate pain. 10/15/15   Oval Linsey, MD  pantoprazole (PROTONIX) 40 MG tablet Take 1 tablet (40 mg total) by mouth 2 (two) times daily. 09/30/15   Oval Linsey, MD  potassium chloride SA (K-DUR,KLOR-CON) 20 MEQ tablet Take 2 tablets (40 mEq total) by mouth daily. 09/30/15   Oval Linsey, MD  rifaximin (XIFAXAN) 550 MG TABS tablet Take 1 tablet (550 mg total) by mouth 2 (two) times daily. 10/15/15   Oval Linsey, MD  spironolactone (ALDACTONE) 25 MG tablet Take 1 tablet (25 mg total) by mouth 2 (two) times daily. 09/30/15   Oval Linsey, MD  sucralfate (CARAFATE) 1 GM/10ML suspension Take 10 mLs (1 g total) by mouth 4 (four) times daily -  with meals and at bedtime. 09/30/15   Oval Linsey, MD  torsemide (DEMADEX) 20 MG tablet Take 3 tablets (60 mg total) by mouth daily. 10/15/15   Oval Linsey, MD  vancomycin 1,500 mg in sodium chloride 0.9 % 500 mL Inject 1,500 mg into the vein every other day. 10/15/15   Oval Linsey, MD  warfarin (COUMADIN) 2 MG tablet Take 1 tablet (2 mg total) by mouth once. Patient not taking: Reported on 10/08/2015 09/30/15   Oval Linsey, MD   BP 104/70 mmHg  Pulse 93  Temp(Src) 98.4 F (36.9 C) (Oral)  Resp 14  Ht  (1.88 m)  Wt 227 lb (102.967 kg)  BMI 29.13 kg/m2  SpO2 91% Physical Exam  Constitutional: He is oriented to person, place, and time. He appears well-developed.  HENT:  Head: Normocephalic.  Eyes: Conjunctivae are normal.  Neck: No tracheal deviation present.  Cardiovascular:  No murmur heard. Musculoskeletal: Normal range of motion.  Neurological: He is oriented to person, place, and time.  Skin: Skin is warm.  Psychiatric: He has a normal mood and affect.    ED Course  Procedures (including critical care time) Labs Review Labs Reviewed - No data to display  Imaging Review Dg Chest 2 View  10/16/2015  CLINICAL DATA:  Cough and diffuse rhonchi, onset this morning. EXAM:  CHEST  2 VIEW COMPARISON:  10/08/2015 FINDINGS: Platelike atelectasis is present in the central and basilar regions, mildly worsened on the right compared to 10/08/2015. No confluent airspace consolidation. No effusions. Normal pulmonary vasculature. Hilar and mediastinal contours are unremarkable unchanged. Shallow inspiration, likely accentuating the atelectatic changes. IMPRESSION: Mild atelectatic changes bilaterally.  Shallow inspiration. Electronically Signed   By: Ellery Plunk M.D.   On: 10/16/2015 06:23   I have personally reviewed and evaluated these images and lab results as part of my medical decision-making.   EKG Interpretation None      MDM   Final diagnoses:  History of vascular access device    Patient had PICC line placed earlier this morning which he has pulled out. She was sent emergency room to have a new PICC line placed but he has an IV in his hand. We called the team that puts the PICC  line in and they are unable to do it tonight but will put 1 inch more morning. He will return in the morning to have a new PICC line   Bethann Berkshire, MD 10/16/15 2102

## 2015-10-16 NOTE — ED Notes (Signed)
Red rash noted to bilat upper thighs and upper arms. Pt resting comfortably in bed.

## 2015-10-16 NOTE — ED Notes (Signed)
Report called to nursing home, and instructed to have patient try again tomorrow for PICC placement per EDP instructions.

## 2015-10-16 NOTE — ED Notes (Signed)
Pt updated on plan of care, still continues to pull at iv site, bp cuff

## 2015-10-25 ENCOUNTER — Ambulatory Visit (INDEPENDENT_AMBULATORY_CARE_PROVIDER_SITE_OTHER): Payer: Medicare Other | Admitting: Internal Medicine

## 2015-10-25 ENCOUNTER — Inpatient Hospital Stay: Payer: Self-pay | Admitting: Internal Medicine

## 2015-10-25 VITALS — BP 99/61 | HR 88

## 2015-10-25 DIAGNOSIS — N183 Chronic kidney disease, stage 3 unspecified: Secondary | ICD-10-CM

## 2015-10-25 DIAGNOSIS — I639 Cerebral infarction, unspecified: Secondary | ICD-10-CM

## 2015-10-25 DIAGNOSIS — R7881 Bacteremia: Secondary | ICD-10-CM

## 2015-10-25 DIAGNOSIS — B9562 Methicillin resistant Staphylococcus aureus infection as the cause of diseases classified elsewhere: Secondary | ICD-10-CM | POA: Diagnosis not present

## 2015-10-25 DIAGNOSIS — G062 Extradural and subdural abscess, unspecified: Secondary | ICD-10-CM

## 2015-10-25 DIAGNOSIS — K7290 Hepatic failure, unspecified without coma: Secondary | ICD-10-CM | POA: Diagnosis present

## 2015-10-25 DIAGNOSIS — K7682 Hepatic encephalopathy: Secondary | ICD-10-CM

## 2015-10-30 NOTE — Progress Notes (Signed)
Rfv: hospital follow up for MRSA bacteremia and epidural abscess Subjective:    Patient ID: Roberto [name], male    DOB: [birth year], 70 y.o.   MRN: [id number]  HPI  70yo M with hx of cirrhosis, alcohol use, CKD who was admitted in early February for sepis found to have MRSA bacteremia and multi-level thoracic epidural abscess leading to lower extremity paralysis. He underwent decompression by Dr. Bevely Palmer on 2/7 and was placed on 8 wk of IV vancomycin. He has had episodes of acute on chronic kidney disease requiring readmission to the hospital for evaluation. His Cr now has been 1.8 still being managed on vancomycin. He remains at the [facility]. He has not had improvement with his neurologic sequelae. In the last week, his son reports that he is increasingly encephalopathic. His nurse reports that he has not had any BM in the last 9 shifts. His son thinks he had a BM on 3/24. The patient is on rifaxamin but not lactulose.  No Known Allergies   Current Outpatient Prescriptions on File Prior to Visit  Medication Sig Dispense Refill  . albuterol (PROVENTIL) (2.5 MG/3ML) 0.083% nebulizer solution Take 3 mLs (2.5 mg total) by nebulization every 6 (six) hours as needed for wheezing or shortness of breath. 75 mL 12  . carisoprodol (SOMA) 350 MG tablet Take 1 tablet (350 mg total) by mouth 3 (three) times daily as needed for muscle spasms. (Patient not taking: Reported on 10/08/2015) 30 tablet 0  . citalopram (CELEXA) 20 MG tablet Take 1 tablet (20 mg total) by mouth daily. 30 tablet 2  . feeding supplement, ENSURE ENLIVE, (ENSURE ENLIVE) LIQD Take 237 mLs by mouth 2 (two) times daily between meals. 237 mL 12  . Heparin Lock Flush (HEPARIN NICU/SCN FLUSH) 1 UNIT/ML SOLN Inject 3 mLs into the vein daily.    Marland Kitchen LORazepam (ATIVAN) 1 MG tablet Take 1 tablet (1 mg total) by mouth every 6 (six) hours as needed for anxiety. 30 tablet 0  . magnesium oxide (MAG-OX) 400 (241.3 Mg) MG tablet Take  1 tablet (400 mg total) by mouth 2 (two) times daily. 30 tablet 3  . omeprazole (PRILOSEC) 20 MG capsule Take 20 mg by mouth daily.    Marland Kitchen oxyCODONE 10 MG TABS Take 1 tablet (10 mg total) by mouth every 6 (six) hours as needed for moderate pain. 30 tablet 0  . pantoprazole (PROTONIX) 40 MG tablet Take 1 tablet (40 mg total) by mouth 2 (two) times daily. 60 tablet 3  . potassium chloride SA (K-DUR,KLOR-CON) 20 MEQ tablet Take 2 tablets (40 mEq total) by mouth daily. 30 tablet 1  . rifaximin (XIFAXAN) 550 MG TABS tablet Take 1 tablet (550 mg total) by mouth 2 (two) times daily. 42 tablet 3  . spironolactone (ALDACTONE) 25 MG tablet Take 1 tablet (25 mg total) by mouth 2 (two) times daily. 60 tablet 3  . sucralfate (CARAFATE) 1 GM/10ML suspension Take 10 mLs (1 g total) by mouth 4 (four) times daily -  with meals and at bedtime. 420 mL 0  . torsemide (DEMADEX) 20 MG tablet Take 3 tablets (60 mg total) by mouth daily. 30 tablet 1  . vancomycin 1,500 mg in sodium chloride 0.9 % 500 mL Inject 1,500 mg into the vein every other day. 5000 mL 3  . warfarin (COUMADIN) 2 MG tablet Take 1 tablet (2 mg total) by mouth once. (Patient not taking: Reported on 10/08/2015) 30 tablet 1   No current  facility-administered medications on file prior to visit.   Active Ambulatory Problems    Diagnosis Date Noted  . Pneumonia 08/17/2011  . HTN (hypertension) 08/17/2011  . COPD (chronic obstructive pulmonary disease) (HCC) 08/17/2011  . Smoker, current status unknown 08/17/2011  . GI bleed 12/13/2011  . Acute blood loss anemia 12/13/2011  . Alcohol abuse 12/13/2011  . Jaundice 12/13/2011  . Coagulopathy (HCC) 12/13/2011  . Macrocytic anemia 12/13/2011  . Renal insufficiency 12/13/2011  . Hepatic encephalopathy (HCC) 12/13/2011  . Debility 12/13/2011  . Frequent falls 12/13/2011  . Hepatic steatosis 12/14/2011  . Compression fracture of L1 lumbar vertebra (HCC) 12/14/2011  . Hypocalcemia 12/14/2011  . Acute renal  failure (HCC) 12/14/2011  . Hypernatremia 12/14/2011  . Hypotension 12/14/2011  . Hyperglycemia 12/14/2011  . Alcoholic hepatitis 12/14/2011  . Abdominal pain, diffuse 12/15/2011  . Thrombocytopenia (HCC) 12/15/2011  . Duodenal ulcer with hemorrhage 12/15/2011  . Esophageal varices without bleeding (HCC) 12/15/2011  . Cirrhosis (HCC) 12/16/2011  . Hx of substance abuse 12/17/2011  . Adynamic ileus (HCC) 12/18/2011  . 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 Helicobacter pylori antibody positive 12/19/2011  . Alcohol withdrawal (HCC) 05/18/2012  . Encephalopathy 05/18/2012  . Dyspnea 05/18/2012  . Ascites 05/18/2012  . Volume overload 05/18/2012  . Acute respiratory failure (HCC) 05/19/2012  . Severe protein-calorie malnutrition (HCC) 05/19/2012  . Sepsis (HCC) 05/19/2012  . Hypokalemia 05/20/2012  . Delirium tremens (HCC) 09/19/2014  . Acute encephalopathy 09/20/2014  . Alcoholic ketoacidosis 09/20/2014  . Alcohol dependence (HCC) 06/02/2015  . 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 Alcohol dependence with uncomplicated withdrawal (HCC)   . ETOH abuse 08/27/2015  . 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 Chronic diastolic heart failure (HCC) 09/12/2015  . Malnutrition of moderate degree 09/13/2015  . Cirrhosis of liver with ascites (HCC)   . MRSA infection   . Ileus (HCC) 09/15/2015  . Abdominal pain 09/15/2015  . Palliative care encounter   . DNR (do not resuscitate) discussion   . Renal failure (ARF), acute on chronic (HCC) 10/08/2015  . AKI (acute kidney injury) (HCC) 10/08/2015   Resolved Ambulatory Problems    Diagnosis Date Noted  . UTI (urinary tract infection) 12/13/2011   Past Medical History  Diagnosis Date  . Hypertension   . C2 cervical fracture (HCC)   . Depression      Review of Systems Unable to obtain due to underlying encephalopathy    Objective:   Physical Exam BP 99/61  mmHg  Pulse 88  SpO2 91% gen = weak, frail appearing male a xo by 1, opens mouth to command but does not answer questions HEENT = dry oral pharynx, dry caked powder to lips and facial hair pulm = CTAB in anterior lung fields Cors= nl s1, s2, no G.M.R abd = soft, BS + GU = foley in place Skin = bilateral heal ulcers right has good granulation bed. Left heel still has necrotic base Left buttocks, decub stage 2 near midline Neuro = asterisix     Assessment & Plan:  mrsa epidural abscess = continue until April 7th. Then will check sed rate and crp. Will give 4 wk of doxycycline (since resistant to bactrim)  CKD 3= continue with vancomycin but would recommend to carefully watch for vanco toxicity  Encephalopathy = asked to give lactulose enema tonight and start oral lactulose daily  Follow up wit hdr. hasanaj in 7 days  See dr. Bevely Palmeritty on thur  Will see back in 4 wk

## 2015-10-30 DEATH — deceased

## 2017-11-29 IMAGING — MR MR CERVICAL SPINE W/O CM
4 of 5 series · 12 of 48 positions shown · non-contrast
Comparison: 09/05/2015 lumbar spine MR. 09/01/2015 CT abdomen and
pelvis. 12/04/[address]

CLINICAL DATA: 69-year-old hypertensive alcoholic male of bilateral
leg weakness. Staph aureus septicemia on treatment. Bilateral leg
weakness. Renal failure. Anemia. Subsequent encounter.

EXAM:
MRI THORACIC SPINE WITHOUT CONTRAST
MRI CERVICAL SPINE WITHOUT CONTRAST
TECHNIQUE: Multiplanar, multisequence MR imaging of the cervical spine was
performed. No intravenous contrast was administered. Multiplanar,
multisequence MR imaging of the thoracic spine was performed. No
intravenous contrast was administered.

[Series 1: T2 · sagittal · 3.0mm · 0.32mm/px · 3 of 15 slices shown (1 of 2)]
[im 3/15]
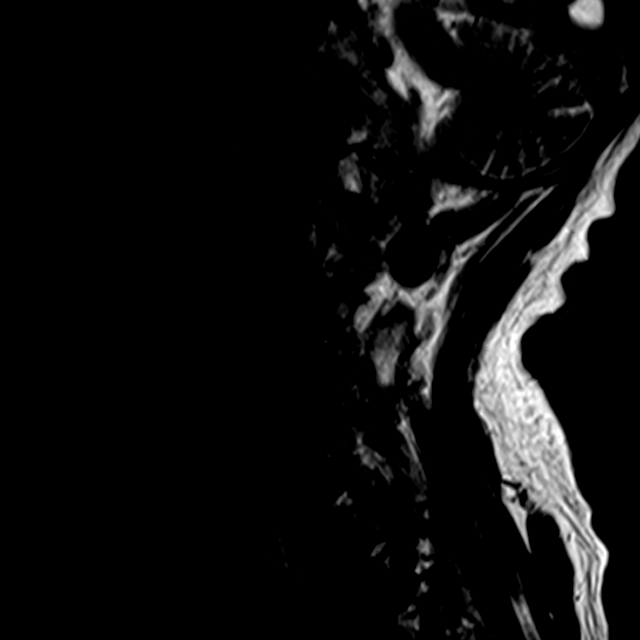
[im 9/15]
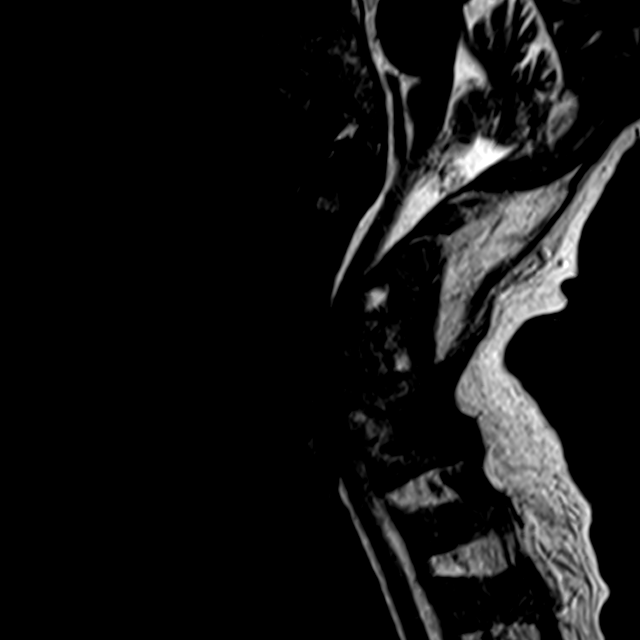
[im 15/15]
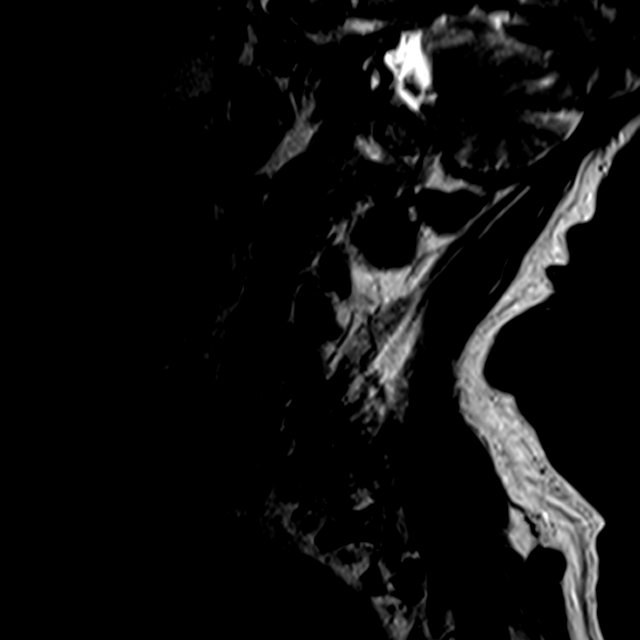

[Series 2: FLAIR · sagittal · 3.0mm · 0.64mm/px · 3 of 13 slices shown]
[im 1/13]
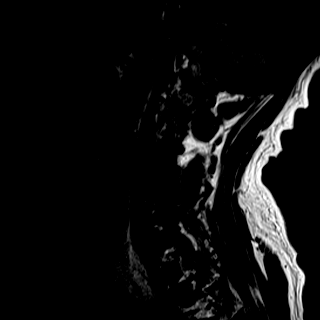
[im 7/13]
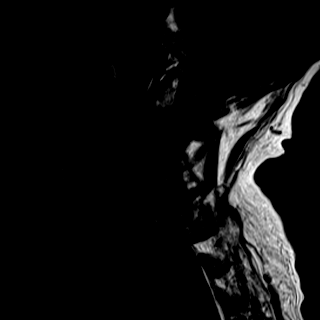
[im 13/13]
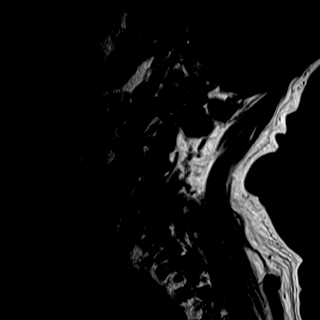

[Series 3: ir sagital · sagittal · 3.0mm · 0.35mm/px · 3 of 13 slices shown]
[im 1/13]
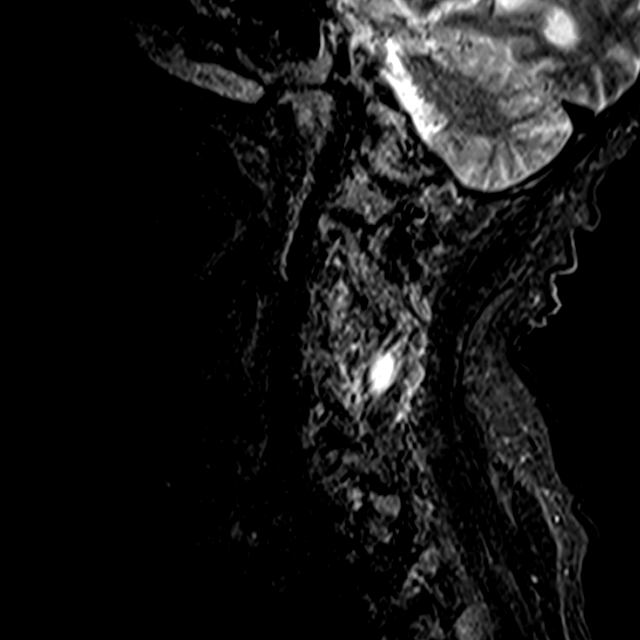
[im 7/13]
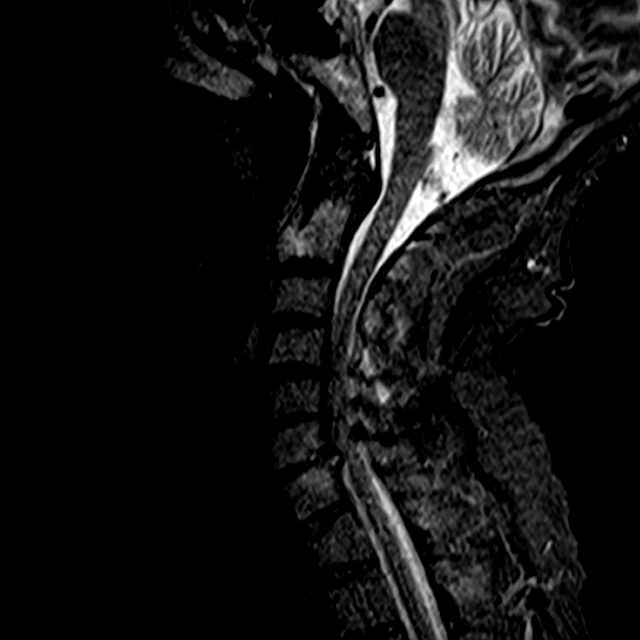
[im 13/13]
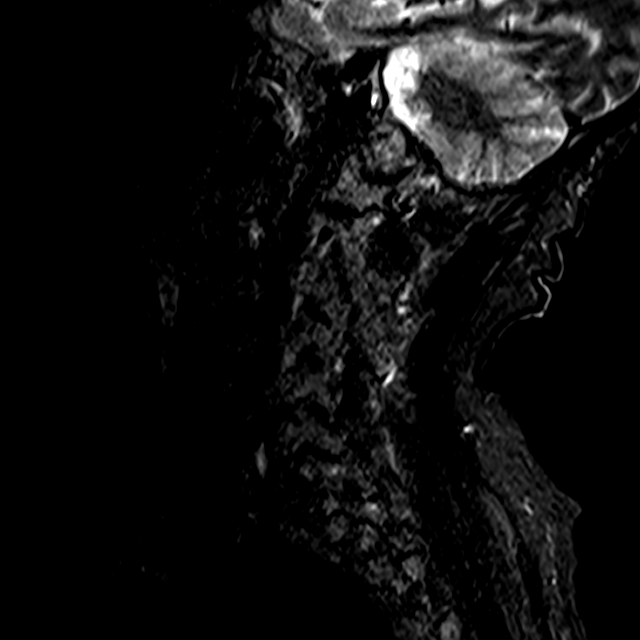

[Series 5: T2 · axial · 3.0mm · 0.22mm/px · z∈[-100,-0]mm · 3 of 43 slices shown (2 of 2)]
[im 6/43]
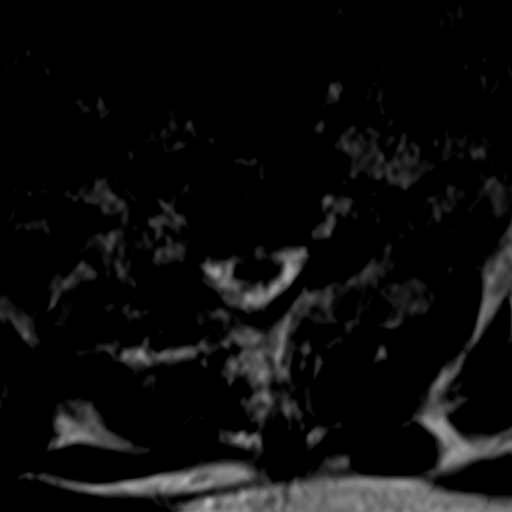
[im 23/43]
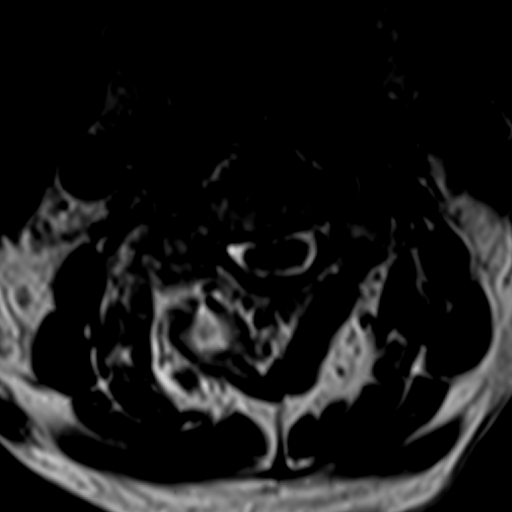
[im 37/43]
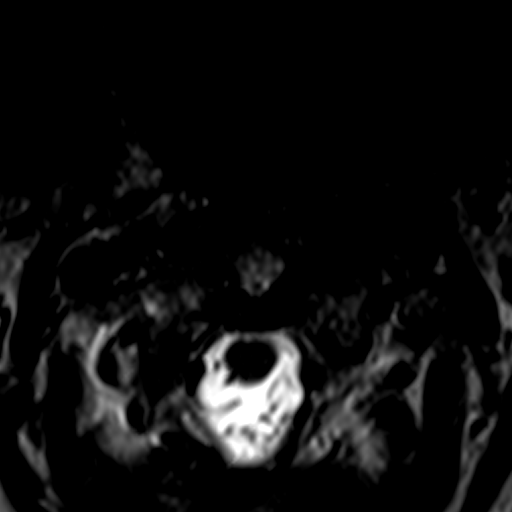

[12 of 48 positions shown; findings below may reference images not displayed]

FINDINGS: MR CERVICAL SPINE

Exam is motion degraded.

Diffuse decreased signal intensity of bone marrow may be related to
patient's anemia.

Fracture of the anterior inferior aspect of the C2 vertebral body of
indeterminate age. Mild edema surrounding the fracture site
suggesting that fracture has not completely healed radiographically.
Minimal incongruent at this level on the 12/04/2014 exam and
therefore fracture may be chronic although not completely healed. No
adjacent prevertebral edema/hematoma as expected with an acute
fracture.

Nonspecific edema within paraspinal musculature C3 through C5 level
on the right. This may be reflective result of acute soft tissue
injury or myositis. Given the below described findings, infection
not excluded although felt to be less likely consideration.

Intracranial atrophy. Cervical medullary junction unremarkable. No
focal cervical cord signal abnormality.

C2-3: Minimal narrowing ventral thecal sac. Minimal foraminal
narrowing.

C3-4: Facet degenerative changes. Minimal anterior slip C3. Bulge.
Narrowing ventral thecal sac. Mild bilateral foraminal narrowing.

C4-5: Bulge/broad-based protrusion. Buckling posterior ligaments.
Moderate spinal stenosis with circumferential moderate cord
flattening. Uncinate hypertrophy and facet degenerative changes.
Moderate bilateral foraminal narrowing.

C5-6: Broad-based disc osteophyte complex. Spinal stenosis with mild
cord flattening. Uncinate hypertrophy and facet degenerative
changes. Moderate bilateral foraminal narrowing.

C6-7: Facet degenerative changes greater on the left. Mild anterior
slip C6. Disc osteophyte complex greater on left. Flattening of the
cord greater on left. Facet degenerative changes and uncinate
hypertrophy with moderate right-sided and marked left-sided
foraminal narrowing.

C7-T1: Remote Schmorl's node deformity T1. Facet degenerative
changes. No significant spinal stenosis. Minimal foraminal
narrowing.

MR THORACIC SPINE

Patient was not able to complete second side of axial imaging. Exam
is motion degraded.

Baseline decreased signal intensity of bone marrow which may be
related to patient's underlying anemia.

Acute T8 compression fracture with 60% loss of height. Mild
retropulsion posterior inferior aspect of the compressed vertebra.
Edema extends into posterior elements. Surrounding soft tissue/fluid
collection.

Of note the is an epidural collection which extends from the upper
T9 level to the mid T5 level. This is greatest in a right posterior
lateral position at the T5 and T6 level, greatest right lateral
position at the T8-9 level and greatest in a ventral right
paracentral position at the upper T9 level. This causes mass effect
upon the adjacent cord which is displaced anteriorly and to left at
the T5 and T6 level. Suggestion of increased signal within the
compress cord which may represent edema.

Etiology of the epidural process is indeterminate. In alcoholic
patient, compression fracture of T[DATE] be related to recent fall
with epidural component related to blood however patient has Staph
Aures septicemia and therefore epidural abscess is also
consideration.

T8-9 bilateral foraminal narrowing. Retropulsion compressed T8
vertebral body contributes to spinal stenosis and cord flattening.

Remote Schmorl's node deformity superior endplate T1.

Remote anterior wedge compression deformity involving superior
endplate T12 with 50% loss of height anteriorly and mild
retropulsion posterior superior aspect with narrowing of the ventral
aspect of thecal sac and mild flattening of the ventral cord which
is slightly posteriorly displaced.

Remote L1 superior endplate compression fracture with 20% loss of
height.
IMPRESSION: MR THORACIC SPINE

Patient was not able to complete second side of axial imaging. Exam
is motion degraded.

Baseline decreased signal intensity of bone marrow which may be
related to patient's underlying anemia.

Acute T8 compression fracture with 60% loss of height. Mild
retropulsion posterior inferior aspect of the compressed vertebra.
Edema extends into posterior elements. Surrounding soft tissue/fluid
collection. Spinal stenosis with mild flattening of the adjacent
cord. Bilateral T8-9 foraminal narrowing.

Epidural collection extends from the upper T9 level to the mid T5
level. This is greatest in a right posterior lateral position at the
T5 and T6 level, greatest right lateral position at the T8-9 level
and greatest in a ventral right paracentral position at the upper T9
level. This causes mass effect upon the adjacent cord which is
displaced anteriorly and to left at the T5 and T6 level. Suggestion
of increased signal within the compress cord which may represent
edema.

Etiology of the epidural process is indeterminate. In alcoholic
patient, compression fracture of T[DATE] be related to recent fall
with epidural component related to blood however patient has Staph
Aures septicemia and therefore epidural abscess is also
consideration.

Remote Schmorl's node deformity superior endplate T1.

Remote anterior wedge compression deformity involving superior
endplate T12 with 50% loss of height anteriorly and mild
retropulsion posterior superior aspect with narrowing of the ventral
aspect of thecal sac and mild flattening of the ventral cord which
is slightly posteriorly displaced.

Remote L1 superior endplate compression fracture with 20% loss of
height.

MR CERVICAL SPINE

Exam is motion degraded.

Diffuse decreased signal intensity of bone marrow may be related to
patient's anemia.

Fracture of the anterior inferior aspect of the C2 vertebral body of
indeterminate age. Mild edema surrounding the fracture site
suggesting that fracture has not completely healed radiographically.
Minimal incongruent at this level on the 12/04/2014 exam and
therefore fracture may be chronic although not completely healed. No
adjacent prevertebral edema/hematoma as expected with an acute
fracture.

Nonspecific edema within paraspinal musculature C3 through C5 level
on the right. This may be reflective result of acute soft tissue
injury or myositis. Given the above described findings, infection
not excluded although felt to be less likely consideration.

C3-4 bulge. Narrowing ventral thecal sac. Mild bilateral foraminal
narrowing.

C4-5 multifactorial moderate spinal stenosis with circumferential
moderate cord flattening. Moderate bilateral foraminal narrowing.

C5-6 broad-based disc osteophyte complex. Spinal stenosis with mild
cord flattening. Uncinate hypertrophy and facet degenerative
changes. Moderate bilateral foraminal narrowing.

C6-7 facet degenerative changes greater on the left. Mild anterior
slip C6. Disc osteophyte complex greater on left. Flattening of the
cord greater on left. Facet degenerative changes and uncinate
hypertrophy with moderate right-sided and marked left-sided
foraminal narrowing.

These results were called by telephone at the time of interpretation
on 09/07/2015 at [DATE] to Dr. Hepburn who verbally acknowledged
these results.
